# Patient Record
Sex: Male | Born: 1935 | Race: White | Hispanic: No | Marital: Married | Smoking: Never smoker
Health system: Southern US, Community
[De-identification: ages and names within clinical notes are randomized; demographics above are authoritative.]

## PROBLEM LIST (undated history)

## (undated) DIAGNOSIS — R609 Edema, unspecified: Secondary | ICD-10-CM

## (undated) DIAGNOSIS — G4730 Sleep apnea, unspecified: Secondary | ICD-10-CM

## (undated) DIAGNOSIS — M4800 Spinal stenosis, site unspecified: Secondary | ICD-10-CM

## (undated) DIAGNOSIS — D649 Anemia, unspecified: Secondary | ICD-10-CM

## (undated) DIAGNOSIS — E669 Obesity, unspecified: Secondary | ICD-10-CM

## (undated) DIAGNOSIS — I10 Essential (primary) hypertension: Secondary | ICD-10-CM

## (undated) DIAGNOSIS — K5790 Diverticulosis of intestine, part unspecified, without perforation or abscess without bleeding: Secondary | ICD-10-CM

## (undated) DIAGNOSIS — I509 Heart failure, unspecified: Secondary | ICD-10-CM

## (undated) DIAGNOSIS — I2510 Atherosclerotic heart disease of native coronary artery without angina pectoris: Secondary | ICD-10-CM

## (undated) DIAGNOSIS — E785 Hyperlipidemia, unspecified: Secondary | ICD-10-CM

## (undated) DIAGNOSIS — I219 Acute myocardial infarction, unspecified: Secondary | ICD-10-CM

## (undated) DIAGNOSIS — I499 Cardiac arrhythmia, unspecified: Secondary | ICD-10-CM

## (undated) DIAGNOSIS — K429 Umbilical hernia without obstruction or gangrene: Secondary | ICD-10-CM

## (undated) DIAGNOSIS — C801 Malignant (primary) neoplasm, unspecified: Secondary | ICD-10-CM

## (undated) DIAGNOSIS — I255 Ischemic cardiomyopathy: Secondary | ICD-10-CM

## (undated) DIAGNOSIS — R748 Abnormal levels of other serum enzymes: Secondary | ICD-10-CM

## (undated) DIAGNOSIS — R0600 Dyspnea, unspecified: Secondary | ICD-10-CM

## (undated) HISTORY — DX: Atherosclerotic heart disease of native coronary artery without angina pectoris: I25.10

## (undated) HISTORY — PX: CARDIAC CATHETERIZATION: SHX172

## (undated) HISTORY — DX: Abnormal levels of other serum enzymes: R74.8

## (undated) HISTORY — DX: Spinal stenosis, site unspecified: M48.00

## (undated) HISTORY — DX: Ischemic cardiomyopathy: I25.5

## (undated) HISTORY — PX: OTHER SURGICAL HISTORY: SHX169

## (undated) HISTORY — DX: Essential (primary) hypertension: I10

## (undated) HISTORY — DX: Obesity, unspecified: E66.9

## (undated) HISTORY — PX: EYE SURGERY: SHX253

## (undated) HISTORY — PX: TOTAL HIP ARTHROPLASTY: SHX124

## (undated) HISTORY — DX: Dyspnea, unspecified: R06.00

## (undated) HISTORY — DX: Hyperlipidemia, unspecified: E78.5

## (undated) HISTORY — PX: SHOULDER SURGERY: SHX246

---

## 2003-09-25 ENCOUNTER — Ambulatory Visit (HOSPITAL_COMMUNITY): Admission: RE | Admit: 2003-09-25 | Discharge: 2003-09-25 | Payer: Self-pay | Admitting: Internal Medicine

## 2005-01-02 ENCOUNTER — Encounter: Admission: RE | Admit: 2005-01-02 | Discharge: 2005-01-02 | Payer: Self-pay | Admitting: Internal Medicine

## 2005-09-08 ENCOUNTER — Ambulatory Visit: Payer: Self-pay | Admitting: Cardiovascular Disease

## 2005-09-11 ENCOUNTER — Ambulatory Visit: Payer: Self-pay

## 2006-07-16 ENCOUNTER — Encounter: Admission: RE | Admit: 2006-07-16 | Discharge: 2006-07-16 | Payer: Self-pay | Admitting: Neurosurgery

## 2006-08-06 ENCOUNTER — Ambulatory Visit: Payer: Self-pay | Admitting: Cardiovascular Disease

## 2006-08-24 ENCOUNTER — Encounter: Payer: Self-pay | Admitting: Cardiovascular Disease

## 2006-08-24 ENCOUNTER — Ambulatory Visit: Payer: Self-pay

## 2006-08-24 ENCOUNTER — Ambulatory Visit: Payer: Self-pay | Admitting: Cardiovascular Disease

## 2006-08-24 LAB — CONVERTED CEMR LAB
AST: 38 units/L — ABNORMAL HIGH (ref 0–37)
Bilirubin, Direct: 0.1 mg/dL (ref 0.0–0.3)
CO2: 27 meq/L (ref 19–32)
Chloride: 99 meq/L (ref 96–112)
Cholesterol: 197 mg/dL (ref 0–200)
GFR calc non Af Amer: 63 mL/min
Glucose, Bld: 102 mg/dL — ABNORMAL HIGH (ref 70–99)
HDL: 42.1 mg/dL (ref >39.0)
LDL Cholesterol: 136 mg/dL — ABNORMAL HIGH (ref 0–99)
Sodium: 136 meq/L (ref 135–145)
Total CHOL/HDL Ratio: 4.7
Total Protein: 6.9 g/dL (ref 6.0–8.3)

## 2006-10-15 ENCOUNTER — Ambulatory Visit: Payer: Self-pay | Admitting: Cardiovascular Disease

## 2006-10-15 LAB — CONVERTED CEMR LAB
ALT: 86 units/L — ABNORMAL HIGH (ref 0–53)
AST: 46 units/L — ABNORMAL HIGH (ref 0–37)
Alkaline Phosphatase: 37 units/L — ABNORMAL LOW (ref 39–117)
Bilirubin, Direct: 0.2 mg/dL (ref 0.0–0.3)
Cholesterol: 205 mg/dL (ref 0–200)
Total Bilirubin: 1.8 mg/dL — ABNORMAL HIGH (ref 0.3–1.2)
Total CHOL/HDL Ratio: 5.4
Total Protein: 6.6 g/dL (ref 6.0–8.3)

## 2006-10-17 ENCOUNTER — Ambulatory Visit: Payer: Self-pay | Admitting: Cardiovascular Disease

## 2007-05-24 ENCOUNTER — Ambulatory Visit: Payer: Self-pay | Admitting: Cardiovascular Disease

## 2007-05-24 LAB — CONVERTED CEMR LAB
ALT: 101 units/L — ABNORMAL HIGH (ref 0–53)
AST: 59 units/L — ABNORMAL HIGH (ref 0–37)
CO2: 29 meq/L (ref 19–32)
Calcium: 9.5 mg/dL (ref 8.4–10.5)
Chloride: 105 meq/L (ref 96–112)
GFR calc Af Amer: 59 mL/min
LDL Cholesterol: 131 mg/dL — ABNORMAL HIGH (ref 0–99)
Potassium: 3.6 meq/L (ref 3.5–5.1)
Sodium: 140 meq/L (ref 135–145)
Total CHOL/HDL Ratio: 4.8
Total Protein: 6.8 g/dL (ref 6.0–8.3)

## 2007-05-27 ENCOUNTER — Ambulatory Visit: Payer: Self-pay | Admitting: Cardiovascular Disease

## 2008-01-27 ENCOUNTER — Ambulatory Visit: Payer: Self-pay | Admitting: Cardiovascular Disease

## 2008-01-27 LAB — CONVERTED CEMR LAB
ALT: 81 units/L — ABNORMAL HIGH (ref 0–53)
Alkaline Phosphatase: 38 units/L — ABNORMAL LOW (ref 39–117)
Bilirubin, Direct: 0.3 mg/dL (ref 0.0–0.3)
CO2: 28 meq/L (ref 19–32)
Calcium: 9.4 mg/dL (ref 8.4–10.5)
GFR calc Af Amer: 70 mL/min
Glucose, Bld: 122 mg/dL — ABNORMAL HIGH (ref 70–99)
Potassium: 3.6 meq/L (ref 3.5–5.1)
Sodium: 137 meq/L (ref 135–145)
TSH: 4.57 microintl units/mL (ref 0.35–5.50)
Total Bilirubin: 1.9 mg/dL — ABNORMAL HIGH (ref 0.3–1.2)
Total CHOL/HDL Ratio: 4.8
Total Protein: 6.9 g/dL (ref 6.0–8.3)
Triglycerides: 143 mg/dL (ref 0–149)

## 2008-02-28 ENCOUNTER — Ambulatory Visit (HOSPITAL_COMMUNITY): Admission: RE | Admit: 2008-02-28 | Discharge: 2008-02-28 | Payer: Self-pay | Admitting: Cardiovascular Disease

## 2008-05-23 DIAGNOSIS — I10 Essential (primary) hypertension: Secondary | ICD-10-CM | POA: Insufficient documentation

## 2008-05-23 DIAGNOSIS — I2510 Atherosclerotic heart disease of native coronary artery without angina pectoris: Secondary | ICD-10-CM | POA: Insufficient documentation

## 2008-05-23 DIAGNOSIS — E669 Obesity, unspecified: Secondary | ICD-10-CM | POA: Insufficient documentation

## 2008-05-23 DIAGNOSIS — E785 Hyperlipidemia, unspecified: Secondary | ICD-10-CM | POA: Insufficient documentation

## 2008-05-25 ENCOUNTER — Encounter: Admission: RE | Admit: 2008-05-25 | Discharge: 2008-05-25 | Payer: Self-pay | Admitting: Neurosurgery

## 2008-06-02 ENCOUNTER — Telehealth: Payer: Self-pay | Admitting: Cardiovascular Disease

## 2008-06-03 ENCOUNTER — Encounter: Payer: Self-pay | Admitting: Physician Assistant

## 2008-06-03 ENCOUNTER — Ambulatory Visit: Payer: Self-pay | Admitting: Cardiology

## 2008-06-03 DIAGNOSIS — M48061 Spinal stenosis, lumbar region without neurogenic claudication: Secondary | ICD-10-CM

## 2008-06-03 DIAGNOSIS — C439 Malignant melanoma of skin, unspecified: Secondary | ICD-10-CM | POA: Insufficient documentation

## 2008-06-18 ENCOUNTER — Telehealth (INDEPENDENT_AMBULATORY_CARE_PROVIDER_SITE_OTHER): Payer: Self-pay

## 2008-06-22 ENCOUNTER — Ambulatory Visit: Payer: Self-pay

## 2008-06-22 ENCOUNTER — Encounter: Payer: Self-pay | Admitting: Cardiovascular Disease

## 2008-07-15 ENCOUNTER — Ambulatory Visit: Payer: Self-pay | Admitting: Cardiovascular Disease

## 2008-07-17 ENCOUNTER — Telehealth: Payer: Self-pay | Admitting: Cardiovascular Disease

## 2008-08-26 ENCOUNTER — Inpatient Hospital Stay (HOSPITAL_COMMUNITY): Admission: RE | Admit: 2008-08-26 | Discharge: 2008-08-27 | Payer: Self-pay | Admitting: Neurosurgery

## 2008-12-30 ENCOUNTER — Encounter (INDEPENDENT_AMBULATORY_CARE_PROVIDER_SITE_OTHER): Payer: Self-pay | Admitting: *Deleted

## 2009-01-04 ENCOUNTER — Ambulatory Visit: Payer: Self-pay | Admitting: Cardiovascular Disease

## 2009-01-04 DIAGNOSIS — M79609 Pain in unspecified limb: Secondary | ICD-10-CM

## 2009-01-06 LAB — CONVERTED CEMR LAB
ALT: 88 units/L — ABNORMAL HIGH (ref 0–53)
Albumin: 3.7 g/dL (ref 3.5–5.2)
BUN: 20 mg/dL (ref 6–23)
CO2: 27 meq/L (ref 19–32)
Calcium: 9 mg/dL (ref 8.4–10.5)
Chloride: 104 meq/L (ref 96–112)
Cholesterol: 183 mg/dL (ref 0–200)
Creatinine, Ser: 1.5 mg/dL (ref 0.4–1.5)
Glucose, Bld: 107 mg/dL — ABNORMAL HIGH (ref 70–99)
HDL: 39.8 mg/dL (ref >39.00)
Total Protein: 7 g/dL (ref 6.0–8.3)
Triglycerides: 116 mg/dL (ref 0.0–149.0)

## 2009-02-01 ENCOUNTER — Ambulatory Visit: Payer: Self-pay

## 2009-02-01 ENCOUNTER — Encounter: Payer: Self-pay | Admitting: Cardiovascular Disease

## 2009-02-08 ENCOUNTER — Ambulatory Visit: Payer: Self-pay | Admitting: Gastroenterology

## 2009-02-08 DIAGNOSIS — Z860100 Personal history of colon polyps, unspecified: Secondary | ICD-10-CM | POA: Insufficient documentation

## 2009-02-08 DIAGNOSIS — K3189 Other diseases of stomach and duodenum: Secondary | ICD-10-CM | POA: Insufficient documentation

## 2009-02-08 DIAGNOSIS — R1013 Epigastric pain: Secondary | ICD-10-CM

## 2009-02-08 LAB — CONVERTED CEMR LAB
AST: 62 units/L — ABNORMAL HIGH (ref 0–37)
Alkaline Phosphatase: 49 units/L (ref 39–117)
BUN: 24 mg/dL — ABNORMAL HIGH (ref 6–23)
Eosinophils Absolute: 0.3 10*3/uL (ref 0.0–0.7)
Eosinophils Relative: 5 % (ref 0.0–5.0)
Glucose, Bld: 126 mg/dL — ABNORMAL HIGH (ref 70–99)
HCT: 46.3 % (ref 39.0–52.0)
Lymphs Abs: 1.9 10*3/uL (ref 0.7–4.0)
MCHC: 33.4 g/dL (ref 30.0–36.0)
MCV: 99.9 fL (ref 78.0–100.0)
Monocytes Absolute: 0.6 10*3/uL (ref 0.1–1.0)
Platelets: 171 10*3/uL (ref 150.0–400.0)
Sodium: 139 meq/L (ref 135–145)
Total Bilirubin: 1.8 mg/dL — ABNORMAL HIGH (ref 0.3–1.2)
Total Protein: 7.7 g/dL (ref 6.0–8.3)
WBC: 5.7 10*3/uL (ref 4.5–10.5)

## 2009-02-11 ENCOUNTER — Ambulatory Visit: Payer: Self-pay | Admitting: Gastroenterology

## 2009-02-16 ENCOUNTER — Encounter: Payer: Self-pay | Admitting: Gastroenterology

## 2009-03-09 ENCOUNTER — Ambulatory Visit: Payer: Self-pay | Admitting: Gastroenterology

## 2009-03-09 DIAGNOSIS — K219 Gastro-esophageal reflux disease without esophagitis: Secondary | ICD-10-CM

## 2009-03-09 DIAGNOSIS — R1084 Generalized abdominal pain: Secondary | ICD-10-CM | POA: Insufficient documentation

## 2009-03-22 ENCOUNTER — Encounter: Payer: Self-pay | Admitting: Gastroenterology

## 2009-03-22 ENCOUNTER — Ambulatory Visit (HOSPITAL_COMMUNITY): Admission: RE | Admit: 2009-03-22 | Discharge: 2009-03-22 | Payer: Self-pay | Admitting: Gastroenterology

## 2009-06-28 ENCOUNTER — Encounter: Admission: RE | Admit: 2009-06-28 | Discharge: 2009-06-28 | Payer: Self-pay | Admitting: Neurosurgery

## 2010-02-06 ENCOUNTER — Encounter: Payer: Self-pay | Admitting: Cardiovascular Disease

## 2010-02-13 LAB — CONVERTED CEMR LAB
ALT: 93 units/L — ABNORMAL HIGH (ref 0–53)
Albumin: 4.1 g/dL (ref 3.5–5.2)
Alkaline Phosphatase: 45 units/L (ref 39–117)
Bilirubin, Direct: 0.1 mg/dL (ref 0.0–0.3)
CO2: 29 meq/L (ref 19–32)
Chloride: 102 meq/L (ref 96–112)
Glucose, Bld: 116 mg/dL — ABNORMAL HIGH (ref 70–99)
Sodium: 140 meq/L (ref 135–145)
Total Protein: 7.1 g/dL (ref 6.0–8.3)

## 2010-02-16 NOTE — Procedures (Signed)
Summary: Upper Endoscopy  Patient: Harold Jennings Note: All result statuses are Final unless otherwise noted.  Tests: (1) Upper Endoscopy (EGD)   EGD Upper Endoscopy       DONE (C)     [facility]     [address]     [place], [place]  [address]           ENDOSCOPY PROCEDURE REPORT           PATIENT:  Harold, Jennings  MR#:  [id number]     BIRTHDATE:  Jan 10, 1933, 76 yrs. old  GENDER:  male           ENDOSCOPIST:  Barbette Hair. Arlyce Dice, MD     Referred by:           PROCEDURE DATE:  02/11/2009     PROCEDURE:  EGD with biopsy, Maloney Dilation of Esophagus     ASA CLASS:  Class II     INDICATIONS:  cough, dyspepsia           MEDICATIONS:   There was residual sedation effect present from     prior procedure., glycopyrrolate (Robinal) 0.2 mg IV,  0.6cc     simethancone 0.6 cc PO, Versed 1mg  IV (correction)     TOPICAL ANESTHETIC:  Exactacain Spray           DESCRIPTION OF PROCEDURE:   After the risks benefits and     alternatives of the procedure were thoroughly explained, informed     consent was obtained.  The LB GIF-H180 [id number] endoscope was     introduced through the mouth and advanced to the third portion of     the duodenum, without limitations.  The instrument was slowly     withdrawn as the mucosa was fully examined.     <<PROCEDUREIMAGES>>           Moderate gastritis was found in the antrum. Multiple areas of     submucosal hemorrhage with overlying erythema. Bxs taken (see     image3).  Esophagitis was found at the gastroesophageal junction.     Grade B erosive esophagitis. Single linear ulcer just proximal to     GE junction (see image5 and image9).  A stricture was found at the     gastroesophageal junction (see image7). Moderate stricture     Dilation with maloney dilator 18mm Moderate resistance; no heme     Otherwise the examination was normal.    Retroflexed views revealed     no abnormalities.    The scope was then withdrawn from the patient     and the  procedure completed.           COMPLICATIONS:  None           ENDOSCOPIC IMPRESSION:     1) Moderate hemorrhagic gastritis in the antrum     2) Erosive Esophagitis at the gastroesophageal junction     3) Stricture at the gastroesophageal junction - s/p dilitation     4) Otherwise normal examination     RECOMMENDATIONS:     1)  Begin zegerid 40mg  daily     2) Call office next 2-3 days to schedule an office appointment for     2 weeks           REPEAT EXAM:  No           ______________________________     Barbette Hair. Arlyce Dice, MD  CC:  Nila Nephew, MD           n.     REVISED:  02/17/2009 03:02 PM     eSIGNED:   Barbette Hair. Butler Vegh at 02/17/2009 03:02 PM           Jerene Pitch, [id number]  Note: An exclamation mark (!) indicates a result that was not dispersed into the flowsheet. Document Creation Date: 02/17/2009 3:03 PM _______________________________________________________________________  (1) Order result status: Final Collection or observation date-time: 02/11/2009 15:43 Requested date-time:  Receipt date-time:  Reported date-time:  Referring Physician:   Ordering Physician: Melvia Heaps [phone]) Specimen Source:  Source: Launa Grill Order Number: [id number] Lab site:

## 2010-02-16 NOTE — Letter (Signed)
Summary: Moviprep Instructions and [facility]  [address] [place], [place] [address]   Phone: [phone]  Fax: [phone]       Harold Jennings    08-11-1932    MRN: [id number]        Procedure Day /Date:THURSDAY 02/11/2009     Arrival Time:2PM     Procedure Time:3PM     Location of Procedure:                    X   [facility] (4th Floor)   PREPARATION FOR COLONOSCOPY WITH MOVIPREP   Starting 5 days prior to your procedure TODAY do not eat nuts, seeds, popcorn, corn, beans, peas,  salads, or any raw vegetables.  Do not take any fiber supplements (e.g. Metamucil, Citrucel, and Benefiber).  THE DAY BEFORE YOUR PROCEDURE         DATE:02/10/2009  DAY: WEDNESDAY  1.  Drink clear liquids the entire day-NO SOLID FOOD  2.  Do not drink anything colored red or purple.  Avoid juices with pulp.  No orange juice.  3.  Drink at least 64 oz. (8 glasses) of fluid/clear liquids during the day to prevent dehydration and help the prep work efficiently.  CLEAR LIQUIDS INCLUDE: Water Jello Ice Popsicles Tea (sugar ok, no milk/cream) Powdered fruit flavored drinks Coffee (sugar ok, no milk/cream) Gatorade Juice: apple, white grape, white cranberry  Lemonade Clear bullion, consomm, broth Carbonated beverages (any kind) Strained chicken noodle soup Hard Candy                             4.  In the morning, mix first dose of MoviPrep solution:    Empty 1 Pouch A and 1 Pouch B into the disposable container    Add lukewarm drinking water to the top line of the container. Mix to dissolve    Refrigerate (mixed solution should be used within 24 hrs)  5.  Begin drinking the prep at 5:00 p.m. The MoviPrep container is divided by 4 marks.   Every 15 minutes drink the solution down to the next mark (approximately 8 oz) until the full liter is complete.   6.  Follow completed prep with 16 oz of clear liquid of your choice (Nothing red or purple).   Continue to drink clear liquids until bedtime.  7.  Before going to bed, mix second dose of MoviPrep solution:    Empty 1 Pouch A and 1 Pouch B into the disposable container    Add lukewarm drinking water to the top line of the container. Mix to dissolve    Refrigerate  THE DAY OF YOUR PROCEDURE      DATE: 02/11/2009 DAY: THURSDAY  Beginning at 10a.m. (5 hours before procedure):         1. Every 15 minutes, drink the solution down to the next mark (approx 8 oz) until the full liter is complete.  2. Follow completed prep with 16 oz. of clear liquid of your choice.    3. You may drink clear liquids until 1PM (2 HOURS BEFORE PROCEDURE).   MEDICATION INSTRUCTIONS  Unless otherwise instructed, you should take regular prescription medications with a small sip of water   as early as possible the morning of your procedure.        OTHER INSTRUCTIONS  You will need a responsible adult at least 75 years of age to accompany you  and drive you home.   This person must remain in the waiting room during your procedure.  Wear loose fitting clothing that is easily removed.  Leave jewelry and other valuables at home.  However, you may wish to bring a book to read or  an iPod/MP3 player to listen to music as you wait for your procedure to start.  Remove all body piercing jewelry and leave at home.  Total time from sign-in until discharge is approximately 2-3 hours.  You should go home directly after your procedure and rest.  You can resume normal activities the  day after your procedure.  The day of your procedure you should not:   Drive   Make legal decisions   Operate machinery   Drink alcohol   Return to work  You will receive specific instructions about eating, activities and medications before you leave.    The above instructions have been reviewed and explained to me by   _______________________    I fully understand and can verbalize these instructions  _____________________________ Date _________

## 2010-02-16 NOTE — Procedures (Signed)
Summary: Colonoscopy  Patient: Harold Jennings Note: All result statuses are Final unless otherwise noted.  Tests: (1) Colonoscopy (COL)   COL Colonoscopy           DONE     [facility]     [address]     [place], [place]  [address]           COLONOSCOPY PROCEDURE REPORT           PATIENT:  Harold, Jennings  MR#:  [id number]     BIRTHDATE:  10-19-1932, 76 yrs. old  GENDER:  male           ENDOSCOPIST:  Harold Hair. Arlyce Dice, MD     Referred by:           PROCEDURE DATE:  02/11/2009     PROCEDURE:  Colonoscopy with snare polypectomy     ASA CLASS:  Class II     INDICATIONS:  Colorectal Cancer Screening, history of     pre-cancerous (adenomatous) colon polyps Last exam 8 years ago           MEDICATIONS:   Fentanyl 50 mcg IV, Versed 6 mg IV, Benadryl 25 mg     IV           DESCRIPTION OF PROCEDURE:   After the risks benefits and     alternatives of the procedure were thoroughly explained, informed     consent was obtained.  Digital rectal exam was performed and     revealed no abnormalities.   The LB CF-H180AL [id number] endoscope     was introduced through the anus and advanced to the cecum, which     was identified by both the appendix and ileocecal valve, without     limitations.  The quality of the prep was excellent, using     MoviPrep.  The instrument was then slowly withdrawn as the colon     was fully examined.     <<PROCEDUREIMAGES>>           FINDINGS:  A sessile polyp was found at the hepatic flexure. It     was 4 mm in size. Polyp was snared without cautery. Retrieval was     successful (see image5). snare polyp  Moderate diverticulosis was     found in the sigmoid colon (see image2 and image12).  This was     otherwise a normal examination of the colon (see image6, image7,     image8, image9, image13, and image15).   Retroflexed views in the     rectum revealed no abnormalities.    The scope was then withdrawn     from the patient and the procedure completed.           COMPLICATIONS:  None           ENDOSCOPIC IMPRESSION:     1) 4 mm sessile polyp at the hepatic flexure     2) Moderate diverticulosis in the sigmoid colon     3) Otherwise normal examination     RECOMMENDATIONS:     1) If the polyp(s) removed today are proven to be adenomatous     (pre-cancerous) polyps, you will need a repeat colonoscopy in 5     years. Otherwise you should continue to follow colorectal cancer     screening guidelines for "routine risk" patients with colonoscopy     in 10 years.           REPEAT EXAM:  You will receive a letter from Dr. Arlyce Jennings in 1-2     weeks, after reviewing the final pathology, with followup     recommendations.           ______________________________     Harold Hair Arlyce Dice, MD           CC:  Harold Nephew, MD           n.     Harold Jennings:   Harold Jennings at 02/11/2009 03:36 PM           Harold Jennings, [id number]  Note: An exclamation mark (!) indicates a result that was not dispersed into the flowsheet. Document Creation Date: 02/11/2009 3:37 PM _______________________________________________________________________  (1) Order result status: Final Collection or observation date-time: 02/11/2009 15:27 Requested date-time:  Receipt date-time:  Reported date-time:  Referring Physician:   Ordering Physician: Melvia Heaps [phone]) Specimen Source:  Source: Launa Grill Order Number: [id number] Lab site:   Appended Document: Colonoscopy     Procedures Next Due Date:    Colonoscopy: 01/2014

## 2010-02-16 NOTE — Letter (Signed)
Summary: Results Letter  [facility]  [address] [place], [place] [address]   Phone: [phone]  Fax: [phone]        March 22, 2009 MRN: [id number]    [facility] [address] [place], [place]  [address]    Dear Mr. Remus,  Your gastric emptying scan did not show any remarkable findings.  Please continue with the recommendations previously discussed.  Should you have any further questions or immediate concers, feel free to contact me.  Sincerely,  Barbette Hair. Arlyce Dice, M.D., [facility]          Sincerely,  Louis Meckel MD  This letter has been electronically signed by your physician.  Appended Document: Results Letter letter mailed

## 2010-02-16 NOTE — Assessment & Plan Note (Signed)
Summary: GASTROENTERITIS--CH   History of Present Illness Visit Type: Initial Consult Primary GI MD: Melvia Heaps MD [facility] Primary Provider: Nila Nephew, MD Requesting Provider: Tonny Bollman, MD Chief Complaint: Increase in belching after meals. Pt also has a generalized left sided pain.  History of Present Illness:   Harold Jennings is a pleasant 77 year old white male referred at the request of Dr. Chilton Si for evaluation of excess belching.  Over the past year been complaining of severe belching after drinking any liquids.  He may belch  up to 25-30 times.  He also is belching between meals.  He denies abdominal pain, per se except for some mild soreness in his left upper quadrant along his left flank.  He denies pyrosis, nausea or vomiting.  There has been no change in bowel habits.  He has tried PPI therapy in the past without relief.  Patient has a history of colon polyps.  Last colonoscopy approximately 8 years ago apparently was negative.    GI Review of Systems    Reports abdominal pain and  belching.     Location of  Abdominal pain: left side.    Denies acid reflux, bloating, chest pain, dysphagia with liquids, dysphagia with solids, heartburn, loss of appetite, nausea, vomiting, vomiting blood, weight loss, and  weight gain.        Denies anal fissure, black tarry stools, change in bowel habit, constipation, diarrhea, diverticulosis, fecal incontinence, heme positive stool, hemorrhoids, irritable bowel syndrome, jaundice, light color stool, liver problems, rectal bleeding, and  rectal pain. Preventive Screening-Counseling & Management      Drug Use:  no.      Current Medications (verified): 1)  Aspirin 81 Mg  Tbec (Aspirin) .... One By Mouth Every Day 2)  Atenolol 25 Mg Tabs (Atenolol) .... Take One Daily 3)  Zetia 10 Mg Tabs (Ezetimibe) .... Take One Daily 4)  Hydrochlorothiazide 25 Mg Tabs (Hydrochlorothiazide) .... Take One Daily 5)  Synthroid 50 Mcg Tabs (Levothyroxine  Sodium) .... Take One Daily 6)  Lisinopril 10 Mg Tabs (Lisinopril) .... Take One Daily 7)  Fish Oil 1000 Mg Caps (Omega-3 Fatty Acids) .Marland Kitchen.. 1 Cap Once Daily 8)  Doxazosin Mesylate 8 Mg Tabs (Doxazosin Mesylate) .... Take 1 Tablet By Mouth Once A Day 9)  Multivitamins  Tabs (Multiple Vitamin) .... Take 1 Tablet By Mouth Once A Day  Allergies (verified): No Known Drug Allergies  Past History:  Past Medical History: Reviewed history from 01/04/2009 and no changes required. Current Problems:  DYSLIPIDEMIA (ICD-272.4) HYPERTENSION (ICD-401.9) OBESITY (ICD-278.00) CAD (ICD-414.00), remote silent inferior MI increased LFTs. Hepatic ultrasound showed echogenic liver suggesting fatty change in February 2010  Past Surgical History: Hip replacement Eye surgery Lower back surgery for spinal stenosis Shoulder surgery  Family History: Father stroke Mother drug abuse No FH of Colon Cancer:  Social History: Married  Tobacco Use - No.  Alcohol Use - yes 3-4 glasses of wine daily Daily Caffeine Use Illicit Drug Use - no Drug Use:  no  Review of Systems  The patient denies allergy/sinus, anemia, anxiety-new, arthritis/joint pain, back pain, blood in urine, breast changes/lumps, change in vision, confusion, cough, coughing up blood, depression-new, fainting, fatigue, fever, headaches-new, hearing problems, heart murmur, heart rhythm changes, itching, menstrual pain, muscle pains/cramps, night sweats, nosebleeds, pregnancy symptoms, shortness of breath, skin rash, sleeping problems, sore throat, swelling of feet/legs, swollen lymph glands, thirst - excessive , urination - excessive , urination changes/pain, urine leakage, vision changes, and voice change.  Vital Signs:  Patient profile:   77 year old male Height:      68 inches Weight:      261.38 pounds BMI:     39.89 Pulse rate:   70 / minute Pulse rhythm:   regular BP sitting:   132 / 68  (left arm) Cuff size:   large  Vitals  Entered By: Christie Nottingham CMA Duncan Dull) (February 08, 2009 10:11 AM)  Physical Exam  Additional Exam:  He is a heavyset male  belching multiple times in the examining room  skin: anicteric; he has a very ruddy complexion. HEENT: normocephalic; PEERLA; no nasal or pharyngeal abnormalities neck: supple nodes: no cervical lymphadenopathy chest: clear to ausculatation and percussion heart: no murmurs, gallops, or rubs abd: soft, nontender; BS normoactive; no abdominal masses, tenderness, organomegaly; there is a prominent venous pattern and a large but reducible umbilical hernia rectal: deferred ext: no cynanosis, clubbing, edema skeletal: no deformities neuro: oriented x 3; no focal abnormalities    Impression & Recommendations:  Problem # 1:  DYSPEPSIA&OTHER SPEC DISORDERS FUNCTION STOMACH (ICD-536.8)  Patient may have ulcer or nonulcer dyspepsia.  His visible belching in my presence raises a  question of aerophagia and may be functional in nature.  Recommendations #1 upper endoscopy  I will make further recommendations pending results of his endoscopy.  While the patient admits to 3-4 drinks daily, I have  a suspicion that he may have underlying alcohol-related liver disease.    Orders: TLB-CBC Platelet - w/Differential (85025-CBCD) TLB-CMP (Comprehensive Metabolic Pnl) (80053-COMP) Colon/Endo (Colon/Endo)  Problem # 2:  PERSONAL HISTORY OF COLONIC POLYPS (ICD-V12.72)  Plan followup colonoscopy  Orders: TLB-CBC Platelet - w/Differential (85025-CBCD) TLB-CMP (Comprehensive Metabolic Pnl) (80053-COMP) Colon/Endo (Colon/Endo)  Problem # 3:  CAD (ICD-414.00) Assessment: Comment Only  Patient Instructions: 1)  CC Dr. Nila Nephew 2)  Your Colonoscopy/Endo is scheduled for 02/11/2009 at 3pm 3)  You can pick up your MoviPrep from your pharmacy today 4)  The medication list was reviewed and reconciled.  All changed / newly prescribed medications were explained.  A complete  medication list was provided to the patient / caregiver. Prescriptions: MOVIPREP 100 GM  SOLR (PEG-KCL-NACL-NASULF-NA ASC-C) As per prep instructions.  #1 x 0   Entered by:   Merri Ray CMA (AAMA)   Authorized by:   Louis Meckel MD   Signed by:   Merri Ray CMA (AAMA) on 02/08/2009   Method used:   Electronically to        [facility] (retail)       [address]       [place], [place]  [address]       Ph: [phone] or [phone]       Fax: [phone]   RxID:   [id number]

## 2010-02-16 NOTE — Letter (Signed)
Summary: Patient Notice- Polyp Results  [facility]  [address] [place], [place] [address]   Phone: [phone]  Fax: [phone]        February 16, 2009 MRN: [id number]    [facility] [address] [place], [place]  [address]    Dear Mr. Tritschler,  I am pleased to inform you that the colon polyp(s) removed during your recent colonoscopy was (were) found to be benign (no cancer detected) upon pathologic examination.  I recommend you have a repeat colonoscopy examination in 5_ years to look for recurrent polyps, as having colon polyps increases your risk for having recurrent polyps or even colon cancer in the future.  Should you develop new or worsening symptoms of abdominal pain, bowel habit changes or bleeding from the rectum or bowels, please schedule an evaluation with either your primary care physician or with me.  Additional information/recommendations:  __ No further action with gastroenterology is needed at this time. Please      follow-up with your primary care physician for your other healthcare      needs.  __ Please call [phone] to schedule a return visit to review your      situation.  __ Please keep your follow-up visit as already scheduled.  _x_ Continue treatment plan as outlined the day of your exam.  Please call us if you are having persistent problems or have questions about your condition that have not been fully answered at this time.  Sincerely,  Louis Meckel MD  This letter has been electronically signed by your physician.  Appended Document: Patient Notice- Polyp Results letter mailed 2.2.11

## 2010-02-16 NOTE — Letter (Signed)
Summary: Results Letter  [facility]  [address] [place], [place] [address]   Phone: [phone]  Fax: [phone]        February 08, 2009 MRN: [id number]    [facility] [address] [place], [place]  [address]    Dear Harold Jennings,  It is my pleasure to have treated you recently as a new patient in my office. I appreciate your confidence and the opportunity to participate in your care.  Since I do have a busy inpatient endoscopy schedule and office schedule, my office hours vary weekly. I am, however, available for emergency calls everyday through my office. If I am not available for an urgent office appointment, another one of our gastroenterologist will be able to assist you.  My well-trained staff are prepared to help you at all times. For emergencies after office hours, a physician from our Gastroenterology section is always available through my 24 hour answering service  Once again I welcome you as a new patient and I look forward to a happy and healthy relationship             Sincerely,  Louis Meckel MD  This letter has been electronically signed by your physician.  Appended Document: Results Letter letter mailed

## 2010-02-16 NOTE — Assessment & Plan Note (Signed)
Summary: ECL FOLLOW UP/YF           (3:30 appt/Deborah)   History of Present Illness Visit Type: Follow-up Visit Primary GI MD: Melvia Heaps MD [facility] Primary Provider: Nila Nephew, MD Requesting Provider: n/a Chief Complaint: ECL f/u History of Present Illness:   Harold Jennings has returned following upper endoscopy and colonoscopy.  His former demonstrated moderate hemorrhagic gastritis and erosive esophagitis.  An early esophageal stricture was dilated.  An adenomatous polyp was removed from his colon.  Diverticulosis was present.  On Zegerid he denies pyrosis.  He still is complaining of postprandial excess belching.  This tends to occur 20-30 minutes after a meal.   GI Review of Systems      Denies abdominal pain, acid reflux, belching, bloating, chest pain, dysphagia with liquids, dysphagia with solids, heartburn, loss of appetite, nausea, vomiting, vomiting blood, weight loss, and  weight gain.        Denies anal fissure, black tarry stools, change in bowel habit, constipation, diarrhea, diverticulosis, fecal incontinence, heme positive stool, hemorrhoids, irritable bowel syndrome, jaundice, light color stool, liver problems, rectal bleeding, and  rectal pain.    Current Medications (verified): 1)  Aspirin 81 Mg  Tbec (Aspirin) .... One By Mouth Every Day 2)  Atenolol 25 Mg Tabs (Atenolol) .... Take One Daily 3)  Zetia 10 Mg Tabs (Ezetimibe) .... Take One Daily 4)  Hydrochlorothiazide 25 Mg Tabs (Hydrochlorothiazide) .... Take One Daily 5)  Synthroid 50 Mcg Tabs (Levothyroxine Sodium) .... Take One Daily 6)  Lisinopril 10 Mg Tabs (Lisinopril) .... Take One Daily 7)  Fish Oil 1000 Mg Caps (Omega-3 Fatty Acids) .Marland Kitchen.. 1 Cap Once Daily 8)  Doxazosin Mesylate 8 Mg Tabs (Doxazosin Mesylate) .... Take 1 Tablet By Mouth Once A Day 9)  Multivitamins  Tabs (Multiple Vitamin) .... Take 1 Tablet By Mouth Once A Day 10)  Zegerid 40-1100 Mg Caps (Omeprazole-Sodium Bicarbonate) .... Take 1 Tab Once  Daily  Allergies (verified): No Known Drug Allergies  Past History:  Past Medical History: Reviewed history from 01/04/2009 and no changes required. Current Problems:  DYSLIPIDEMIA (ICD-272.4) HYPERTENSION (ICD-401.9) OBESITY (ICD-278.00) CAD (ICD-414.00), remote silent inferior MI increased LFTs. Hepatic ultrasound showed echogenic liver suggesting fatty change in February 2010  Past Surgical History: Reviewed history from 02/08/2009 and no changes required. Hip replacement Eye surgery Lower back surgery for spinal stenosis Shoulder surgery  Family History: Reviewed history from 02/08/2009 and no changes required. Father stroke Mother drug abuse No FH of Colon Cancer:  Social History: Reviewed history from 02/08/2009 and no changes required. Married  Tobacco Use - No.  Alcohol Use - yes 3-4 glasses of wine daily Daily Caffeine Use Illicit Drug Use - no  Review of Systems  The patient denies allergy/sinus, anemia, anxiety-new, arthritis/joint pain, back pain, blood in urine, breast changes/lumps, change in vision, confusion, cough, coughing up blood, depression-new, fainting, fatigue, fever, headaches-new, hearing problems, heart murmur, heart rhythm changes, itching, muscle pains/cramps, night sweats, nosebleeds, shortness of breath, skin rash, sleeping problems, sore throat, swelling of feet/legs, swollen lymph glands, thirst - excessive, urination - excessive, urination changes/pain, urine leakage, vision changes, and voice change.    Vital Signs:  Patient profile:   77 year old male Height:      68 inches Weight:      267 pounds BMI:     40.74 BSA:     2.31 Pulse rate:   64 / minute Pulse rhythm:   regular BP sitting:  132 / 80  (left arm) Cuff size:   regular  Vitals Entered By: Ok Anis CMA (March 09, 2009 3:34 PM)   Impression & Recommendations:  Problem # 1:  DYSPEPSIA&OTHER SPEC DISORDERS FUNCTION STOMACH (ICD-536.8) Symptoms could be due to  aerophagia.  When asked about anxiety he claims that he is quite calm and does not feel that this is a factor.  Excess belching  is unlikely to be due to GERD.  Gastroparesis is also a consideration.  Recommendations #1 add simethicone after each meal #2 gastric imaging scan #3 continue Zegerid  Problem # 2:  PERSONAL HISTORY OF COLONIC POLYPS (ICD-V12.72) Plan followup colonoscopy in 5 years  Patient Instructions: 1)  CC Dr. Royston Bake  Appended Document: ECL FOLLOW UP/YF           (3:30 appt/Deborah)    Clinical Lists Changes  Medications: Changed medication from ZEGERID 40-1100 MG CAPS (OMEPRAZOLE-SODIUM BICARBONATE) take 1 tab once daily to ZEGERID 40-1100 MG CAPS (OMEPRAZOLE-SODIUM BICARBONATE) take 1 tab once daily - Signed Rx of ZEGERID 40-1100 MG CAPS (OMEPRAZOLE-SODIUM BICARBONATE) take 1 tab once daily;  #30 x 6;  Signed;  Entered by: Merri Ray CMA (AAMA);  Authorized by: Harold Meckel MD;  Method used: Electronically to [id number] [address] [place], [place]  [address], Ph: [phone] or [phone], Fax: [phone]    Prescriptions: ZEGERID 40-1100 MG CAPS (OMEPRAZOLE-SODIUM BICARBONATE) take 1 tab once daily  #30 x 6   Entered by:   Merri Ray CMA (AAMA)   Authorized by:   Harold Meckel MD   Signed by:   Merri Ray CMA (AAMA) on 03/09/2009   Method used:   Electronically to        [id number] (retail)       [address]       [place], [place]  [address]       Ph: [phone] or [phone]       Fax: [phone]   RxID:   [id number]    Appended Document: Orders Update    Clinical Lists Changes  Problems: Added new problem of GERD (ICD-530.81) Added new problem of ABDOMINAL PAIN, GENERALIZED (ICD-789.07) Orders: Added new Test order of Gastric Emptying Scan (GES) - Signed

## 2010-02-16 NOTE — Miscellaneous (Signed)
  Clinical Lists Changes  Medications: Added new medication of ZEGERID 40-1100 MG CAPS (OMEPRAZOLE-SODIUM BICARBONATE) take 1 tab once daily - Signed Rx of ZEGERID 40-1100 MG CAPS (OMEPRAZOLE-SODIUM BICARBONATE) take 1 tab once daily;  #30 x 2;  Signed;  Entered by: Louis Meckel MD;  Authorized by: Louis Meckel MD;  Method used: Electronically to [id number] [address], [place], [place]  [address], Ph: [phone] or [phone], Fax: [phone]    Prescriptions: ZEGERID 40-1100 MG CAPS (OMEPRAZOLE-SODIUM BICARBONATE) take 1 tab once daily  #30 x 2   Entered and Authorized by:   Louis Meckel MD   Signed by:   Louis Meckel MD on 02/11/2009   Method used:   Electronically to        [id number] (retail)       [address]       [place], [place]  [address]       Ph: [phone] or [phone]       Fax: [phone]   RxID:   [id number]

## 2010-02-16 NOTE — Letter (Signed)
Summary: Results Letter  [facility]  [address] [place], [place] [address]   Phone: [phone]  Fax: [phone]        February 16, 2009 MRN: [id number]    [facility] [address] [place], [place]  [address]    Dear Mr. Choquette,   Your stomach biopsies demonstrated inflammatory changes only.    Please follow the recommendations previously discussed.  Should you have any immediate concerns or questions, feel free to contact me at the office.    Sincerely,  Barbette Hair. Arlyce Dice, M.D., [facility]          Sincerely,  Louis Meckel MD  This letter has been electronically signed by your physician.  Appended Document: Results Letter letter mailed 2.2.11

## 2010-04-12 ENCOUNTER — Other Ambulatory Visit: Payer: Self-pay | Admitting: Cardiovascular Disease

## 2010-04-14 NOTE — Telephone Encounter (Signed)
[address] °

## 2010-04-16 ENCOUNTER — Other Ambulatory Visit: Payer: Self-pay | Admitting: *Deleted

## 2010-04-16 MED ORDER — HYDROCHLOROTHIAZIDE 25 MG PO TABS: 25.0000 mg | ORAL_TABLET | Freq: Every day | ORAL | Status: DC

## 2010-04-23 LAB — CBC
HCT: 44.1 % (ref 39.0–52.0)
Hemoglobin: 15.6 g/dL (ref 13.0–17.0)
MCHC: 35.3 g/dL (ref 30.0–36.0)
MCV: 99.7 fL (ref 78.0–100.0)
Platelets: 156 10*3/uL (ref 150–400)
RBC: 4.43 MIL/uL (ref 4.22–5.81)
RDW: 13.2 % (ref 11.5–15.5)
WBC: 5.7 10*3/uL (ref 4.0–10.5)

## 2010-04-23 LAB — BASIC METABOLIC PANEL
BUN: 18 mg/dL (ref 6–23)
CO2: 26 mEq/L (ref 19–32)
Calcium: 9.6 mg/dL (ref 8.4–10.5)
Chloride: 102 mEq/L (ref 96–112)
Creatinine, Ser: 1.42 mg/dL (ref 0.4–1.5)
GFR calc Af Amer: 59 mL/min — ABNORMAL LOW (ref >60)
GFR calc non Af Amer: 49 mL/min — ABNORMAL LOW (ref >60)
Glucose, Bld: 147 mg/dL — ABNORMAL HIGH (ref 70–99)
Potassium: 3.9 mEq/L (ref 3.5–5.1)
Sodium: 137 mEq/L (ref 135–145)

## 2010-05-10 ENCOUNTER — Ambulatory Visit: Payer: 59 | Admitting: Pain Medicine

## 2010-05-18 ENCOUNTER — Ambulatory Visit: Payer: 59 | Admitting: Pain Medicine

## 2010-05-31 NOTE — Op Note (Signed)
NAMEJOSEH, SJOGREN               ACCOUNT NO.:  [id number]   MEDICAL RECORD NO.:  [id number]          PATIENT TYPE:  OIB   LOCATION:  3534                         FACILITY:  [facility]   PHYSICIAN:  Hewitt Shorts, M.D.DATE OF BIRTH:  [birth year]   DATE OF PROCEDURE:  08/26/2008  DATE OF DISCHARGE:                               OPERATIVE REPORT   PREOPERATIVE DIAGNOSES:  Lumbar stenosis, lumbar spondylosis, lumbar  degenerative disk disease, and neurogenic claudication.   PREOPERATIVE DIAGNOSES:  Lumbar stenosis, lumbar spondylosis, lumbar  degenerative disk disease, and neurogenic claudication.   PROCEDURE:  L3-L5 decompressive lumbar laminectomy with decompression of  the exiting  L2, L3, L4, and L5 nerve roots bilaterally with  microdissection.   SURGEON:  Hewitt Shorts, MD   ASSISTANT:  Clydene Fake, MD   ANESTHESIA:  General endotracheal.   INDICATIONS:  The patient is a 77 year old man who presented with  neurogenic claudication.  He is found to have advanced degenerative  change in the lumbar spine with severe stenosis at L3-L4 and L4-L5  levels.  Decision was made to proceed with decompressive lumbar  laminectomy.   PROCEDURE IN DETAIL:  The patient was brought to the operating room and  placed under general endotracheal anesthesia.  The patient was turned to  a prone position.  Lumbar region was prepped with Betadine soap and  solution and draped in a sterile fashion.  The midline was infiltrated  with local anesthetic with epinephrine and then a midline incision made  over the lower lumbar spine.  Dissection was carried down through the  subcutaneous tissue.  Bipolar cautery and electrocautery was used to  maintain hemostasis.  Dissection was carried down to the lumbar fascia,  which was incised bilaterally.  The paraspinal muscles were dissected  from the spinous process and lamina in a subperiosteal fashion.  A  localized x-ray was taken of the L3, L4, and  L5 spinous process and  lamina were identified.  We proceeded with laminectomy using double-  action rongeurs and the Stryker high-speed drill.  Using magnification  with microdissection and microsurgical technique, we carefully removed  the thinned lamina.  Ligamentum flavum was markedly thickened with  partial calcification, spondylotic overgrowth.  We carefully removed the  remaining lamina, taking care to leave the underlying thecal sac  undisturbed.  The segmental ligamentum flavum was removed from the  dorsal aspect of the canal and then we carefully dissected laterally  localizing the lateral aspect to the ligamentum flavum further  decompressing the exiting neuroforamen and exiting nerve roots, and we  were able to decompress the exiting L2, L3, L4, and L5 nerve roots  bilaterally.  Care was taken to leave the facet joints intact to low.  The facet joints had advanced degeneration bilaterally at L4-5 worse  than L3-4.  Once decompression was completed, the wound was irrigated  extensively with saline solution and subsequently with bacitracin  solution.  Good hemostasis was established.  We did place a layer of  Gelfoam with thrombin in the laminectomy defect and then proceeded with  closure.  Paraspinal muscles were  approximated with interrupted undyed 1  Vicryl sutures.  Deep fascia was closed with interrupted undyed #1  Vicryl sutures.  Scarpa fascia was closed with interrupted undyed #1  Vicryl sutures.  The subcutaneous and subcuticular were closed with  interrupted inverted 2-0 undyed Vicryl sutures.  The skin was  approximated with Dermabond.  The wound was dressed with  Adaptic and sterile gauze.  The procedure was tolerated well.  Following  surgery, the patient was turned back to the supine position, to be  reversed from the anesthetic, extubated, and transferred to the recovery  room for further care.  Estimated blood loss 100 mL.  Sponge and needle  count were  correct.      Hewitt Shorts, M.D.  Electronically Signed     Hewitt Shorts, M.D.  Electronically Signed    RWN/MEDQ  D:  08/26/2008  T:  08/27/2008  Job:  [id number]

## 2010-05-31 NOTE — Assessment & Plan Note (Signed)
[facility]                            CARDIOLOGY OFFICE NOTE   [name], [name]                      MRN:          [id number]  DATE:05/27/2007                            DOB:          February 23, 1932    HISTORY OF PRESENT ILLNESS:  Harold Jennings was seen in follow-up at the  [facility] office on May 27, 2007.  Mr. Riordan is a delightful  77 year old gentleman with coronary artery disease and history of silent  inferior MI.  He has marked obesity, hypertension and dyslipidemia.  He  presented today for regular follow-up.  He is asymptomatic but is only  minimally active at this time.  He is limited by low back problems.  He  has actually been considering lumbar surgery for spinal stenosis.  He is  limited by leg pain and weakness as well as back trouble.  He  specifically denies chest pain or pressure.  He has exertional dyspnea  with moderate level activity, but is stable over time.  He denies  orthopnea, PND or edema.  Overall, he feels relatively well and has no  specific complaints today.   MEDICATIONS:  1. Doxazosin 4 mg at bedtime.  2. Levothyroxine 50 mcg daily.  3. Aspirin 81 mg daily.  4. Zetia 10 mg daily.  5. Hydrochlorothiazide 25 mg daily.  6. Lisinopril 10 mg daily.  7. Atenolol 25 mg daily.  8. NyQuil at bedtime.   ALLERGIES:  NKDA.   PHYSICAL EXAMINATION:  GENERAL:  The patient is alert and oriented.  He  is in no acute distress.  VITAL SIGNS:  Weights 263, blood pressure is 128/84, heart rate 46,  respiratory rates 18.  HEENT:  Normal.  NECK:  Normal carotid upstrokes without bruits.  Jugular venous pressure  is difficult to measure but appears normal.  LUNGS:  Clear bilaterally.  HEART:  Bradycardic and regular without murmurs or gallops.  ABDOMEN:  Soft, obese, nontender.  EXTREMITIES:  There is trace pretibial edema bilaterally.  SKIN:  Warm and dry without rash.   STUDIES:  EKG shows marked sinus bradycardia  with first-degree AV block  and right bundle branch block.   RECORD REVIEW:  Adenosine Myoview from August 2007 demonstrates small  fixed inferior defect with no evidence of ischemia.  LVEF was preserved  at 52%.   Creatinine 1.5, total bilirubin 1.6, alkaline phosphatase 41, AST 59,  ALT 101, cholesterol 190, triglycerides 100, HDL 40, LDL 131.   ASSESSMENT:  Mr. Shean remained stable from a cardiac standpoint.  His  cardiac problems are as follows:  1. Coronary artery disease.  Silent inferior MI as noted.  He has had      a cardiac catheterization that demonstrated a chronically occluded      right coronary artery with left-to-right collaterals.  Adenosine      Myoview within the last 2 years showed a small fixed inferior      defect with no inducible ischemia.  Continue current medical      therapy without changes.  He is on an antiplatelet with low-dose  aspirin as well as an ACE inhibitor and beta blocker.  I would like      to see him back in 6 months for follow-up.  At that time, I may      consider repeat stress study since he has a past history of silent      coronary disease and he may not have symptoms to guide further      testing.  2. Dyslipidemia.  Lipids are elevated with an LDL of 131.  His goal      LDL should be less than 100 and ideally less than 70.  Neither Dr.      Chilton Si nor I are in favor of starting a statin due to his elevated      liver function tests.  Will continue Zetia.  Encouraged weight      loss.  3. Surgical risk.  Mr. Dalton surgical risk from cardiac perspective      is low.  He has preserved LV function is stable on his current      medical regimen.  His stress study from 2007 showed no ischemia.  I      do not think he needs further risk stratification at this point.      If he proceeds with surgery, he should remain on his beta blocker      without interruption and therapy.  I really think he would do      better with surgery if he could  lose 30-40 pounds prior to his      operation, but this likely is not feasible for him.  I did review      this in detail today, and I am hopeful that that will provide some      motivation for weight loss.  4. For follow-up, I will see Mr. Hasler back in 6 months or sooner if      any new problems arise.     Veverly Fells. Excell Seltzer, MD  Electronically Signed    MDC/MedQ  DD: 05/27/2007  DT: 05/27/2007  Job #: [id number]   cc:   Erskine Speed, M.D.  Hewitt Shorts, M.D.

## 2010-05-31 NOTE — Assessment & Plan Note (Signed)
[name]                            CARDIOLOGY OFFICE NOTE   Jennings, Harold                      MRN:          [id number]  DATE:01/27/2008                            DOB:          [birth year]    REASON FOR VISIT:  Followup CAD.   HISTORY OF PRESENT ILLNESS:  Harold Jennings is a 77 year old gentleman with  coronary artery disease and history of silent inferior wall MI.  His  other cardiovascular comorbidities include obesity, hypertension, and  dyslipidemia.  He has had elevated liver function test in the past and  has not been tolerant of statins secondary to his LFTs.  He struggled  with obesity and has limitation from spinal stenosis, so he is not able  to exercise.  He has chronic exertional dyspnea that is stable overtime  and occurs with moderate level activity such as climbing stairs.  He  denies orthopnea, PND, edema, or chest pain.  He has no other complaints  today.   MEDICATIONS:  1. Doxazosin 4 mg at bedtime.  2. L-thyroxine 50 mcg.  3. Aspirin 81 mg.  4. Zetia 10 mg.  5. Hydrochlorothiazide 25 mg.  6. Lisinopril 10 mg.  7. Atenolol 25 mg.  8. Aleve 2 every morning.   ALLERGIES:  NKDA.   PHYSICAL EXAMINATION:  GENERAL:  The patient is alert and oriented in no  acute distress.  VITAL SIGNS:  Weight is 264 pounds, blood pressure is 132/80, heart rate  is 53, and respiratory rate is 12.  HEENT:  Normal.  NECK:  Normal carotid upstrokes.  No bruits.  JVP normal.  LUNGS:  Clear bilaterally.  HEART:  Regular rate and rhythm.  No murmurs or gallops.  ABDOMEN:  Soft and nontender.  No organomegaly.  Obese, positive bowel  sounds.  EXTREMITIES:  No clubbing, cyanosis, or edema.  Peripheral pulses are  intact and equal.   EKG shows sinus bradycardia with right bundle-branch block and age-  indeterminate inferior MI.   ASSESSMENT:  1. Coronary artery disease.  The patient had a Myoview scan in August      2007, that showed no  ischemia.  We will continue on current medical      therapy without changes.  He is having no angina.  2. Dyslipidemia.  The patient is statin intolerant due to his elevated      LFTs.  Continue Zetia.  We will check lipids and LFTs today.  3. Hypertension.  Blood pressure is under good control on the      combination of lisinopril and atenolol.  4. Obesity.  We had a long discussion regarding the importance of      weight reduction.  He is going to try the [facility] Diet.   For followup, I would like to see Harold Jennings back in 6 months.     Veverly Fells. Excell Seltzer, MD  Electronically Signed    MDC/MedQ  DD: 01/27/2008  DT: 01/28/2008  Job #: [id number]   cc:   Erskine Speed, M.D.

## 2010-05-31 NOTE — Assessment & Plan Note (Signed)
[facility]                            CARDIOLOGY OFFICE NOTE   LYNKIN, SAINI                      MRN:          [id number]  DATE:08/06/2006                            DOB:          09/15/1932    Harold Jennings presents for followup today as an outpatient at the [facility].  I initially saw him back in August 2007 for  complaints of chest pain.  He has known coronary artery disease and had  sustained a silent inferior wall MI at some point.  He had a cardiac  catheterization in 2000 that demonstrated an occluded right coronary  artery that filled from collaterals with an LVEF estimated in the range  of 40-50%.  His stress nuclear study performed September 11, 2005,  demonstrated a small inferior infarction without ischemia and preserved  LV function with a gated EF of 52%.   From a symptomatic standpoint, Harold Jennings main complaint is that of  exertional dyspnea.  He has a difficult time quantifying the duration  but notes that he is dyspneic with one flight of stairs.  He has no  resting symptoms.  He had no further chest discomfort.  He denies  orthopnea, PND or edema.  He has no other complaints.   Current medications include:  1. Lisinopril 10 mg daily.  2. Atenolol 25 mg daily.  3. Doxazosin 4 mg at bedtime.  4. L-thyroxine 50 mcg daily.  5. Aspirin 81 mg daily.  6. Zetia 10 mg daily.   ALLERGIES:  No known drug allergies.   PHYSICAL EXAMINATION:  The patient is alert and oriented.  He is an  obese male in no acute distress.  His weight is 261 pounds, blood  pressure is 140/72, heart rate is 53, respiratory rate is 16.  HEENT:  Normal.  NECK:  Normal carotid upstrokes without bruits.  Jugular venous pressure  is normal.  LUNGS:  Clear to auscultation bilaterally.  HEART:  The apex is not palpable.  The heart is regular rate and rhythm  without murmurs or gallops.  ABDOMEN:  Soft, obese, nontender, no organomegaly.  EXTREMITIES:  No cyanosis, clubbing or edema.  Peripheral pulses are 2+  and equal throughout.   EKG shows sinus bradycardia with right bundle-branch block and a  suggestion of LVH.   Lipids from May 5 show total cholesterol of 227, HDL 52, LDL 153.  AST  and ALT were normal at 33 and 46 respectively.   ASSESSMENT:  1. Harold Jennings is a 77 year old gentleman with exertional dyspnea.  I      suspect his weight is playing a major factor as he has marked      obesity.  It is also possible that with his history of hypertension      and coronary artery disease that he may have a degree of diastolic      dysfunction.  I have elected to give him a trial of      hydrochlorothiazide to optimize his blood pressure control and also      a low-dose diuretic may help to optimize  his LV filling pressures.      I counseled him regarding adequate potassium and would like to      recheck a basic metabolic panel in 2 weeks to check his potassium      level.  He will have an echocardiogram done at that time as well to      evaluate LV function and also his diastolic parameters.  I do not      suspect any valvular heart disease is playing a role, based on his      unremarkable physical exam.  We also had a long discussion      regarding his weight and the importance of weight reduction via      diet and exercise.  2. Dyslipidemia.  Harold Jennings has been intolerant to statins.  He is      currently on Zetia.  He needs to make aggressive lifestyle changes      to attempt to improve his cholesterol.  Also may consider red yeast      rice.  Will review at his return visit.  3. Hypertension.  Discussion as above.  Currently on lisinopril and      atenolol.  Adding hydrochlorothiazide 25 mg daily.   I would like to see Harold Jennings back in 3 months and at that time will  review his echocardiogram with them as well as his laboratory data.  We  also need to keep a close eye on his lipids as they are not anywhere   near goal for someone with coronary artery disease.     Veverly Fells. Excell Seltzer, MD  Electronically Signed    MDC/MedQ  DD: 08/06/2006  DT: 08/06/2006  Job #: [id number]   cc:   Erskine Speed, M.D.

## 2010-05-31 NOTE — Assessment & Plan Note (Signed)
[facility]                            CARDIOLOGY OFFICE NOTE   Harold, Jennings                      MRN:          [id number]  DATE:10/17/2006                            DOB:          August 13, 1932    Harold Jennings returns for followup at the [facility] Cardiology office on  October 17, 2006.  Harold Jennings is stable with no change in his symptoms  since I last saw him in July.  Harold Jennings has a history of coronary  artery disease and had a silent inferior wall myocardial infarction.  He  underwent a cardiac catheterization back in 2000 that demonstrated a  collateralized right coronary artery that was chronically occluded.  His  left ventricular ejection fraction has been in the range of 40% to 50%.  He underwent a stress nuclear study in August of 2007 that showed no  ischemia.  From a symptomatic standpoint, Harold Jennings continues to have  some exertional dyspnea and generalized fatigue.  He is dyspneic with 1  flight of stairs.  He has no resting symptoms.  He has no chest  discomfort.  He has no other complaints.   CURRENT MEDICATIONS:  1. Lisinopril 10 mg daily.  2. Atenolol 25 mg daily.  3. Doxazosin 4 mg at bedtime.  4. L-thyroxine 50 mcg daily.  5. Aspirin 81 mg daily.  6. Zetia 10 mg daily.  7. Hydrochlorothiazide 25 mg daily.  8. Benadryl at bedtime.   ALLERGIES:  No known drug allergies.   PHYSICAL EXAMINATION:  The patient is alert and oriented, he is in no  acute distress.  He is an obese white male.  The weight is 264 pounds, blood pressure  116/60 in the right, 128/66 in the left, heart rate 60, respiratory rate  16.  HEENT:  Normal.  NECK:  Normal carotid upstrokes.  Jugular venous pressure is normal.  LUNGS:  Clear to auscultation bilaterally.  HEART:  Regular rate and rhythm without murmurs or gallops.  ABDOMEN:  Soft, obese, nontender, no bruits.  EXTREMITIES:  No cyanosis, clubbing, or edema.  Peripheral pulses are 2+  and  equal throughout.   Labs were reviewed and demonstrated mildly elevated liver function tests  with an AST of 46 and an ALT of 86.  His total cholesterol is 205,  triglycerides were 136, HDL 38, LDL 148.   ASSESSMENT:  1. Coronary artery disease.  No evidence of ischemia or signs of      angina at this point.  Continue with medical therapy which includes      an angiotensin-converting enzyme inhibitor, beta blocker, and      aspirin.  For discussion of cholesterol management, see below.  2. Hypertension.  Blood pressure is in the optimal range on a      combination of lisinopril, atenolol and hydrochlorothiazide.  3. Dyslipidemia.  Currently he is taking Zetia.  Unfortunately, Mr.      Jennings has elevated transaminases, and I do not feel comfortable      starting him on statin therapy.  He tells me that Dr. Chilton Si is in  agreement, and does not want him on statins.  I suggested that he      try red yeast rice along with Zetia.  Will follow him up with      lipids and liver function tests at the time of his return visit in      6 months.  I will defer whether any further investigation is needed      into his elevated transaminases to Dr. Chilton Si.  Harold Jennings would be      particularly at risk for steatohepatitis, and it is also possible      that his alcohol consumption is contributing to his increased      transaminases.   For followup, I would like to see Harold Jennings back in 6 months or sooner  if any new problems arise.  We spent a great deal of time today  reviewing strategies for weight loss with carbohydrate restriction.  He  informs me that he is unable to exercise due to spinal stenosis and  limitations with walking, and he is pursuing an evaluation for back  surgery.     Veverly Fells. Excell Seltzer, MD  Electronically Signed    MDC/MedQ  DD: 10/17/2006  DT: 10/17/2006  Job #: [id number]   cc:   Erskine Speed, M.D.

## 2010-06-03 NOTE — Assessment & Plan Note (Signed)
[facility]                              CARDIOLOGY OFFICE NOTE   Harold Jennings, Harold Jennings                      MRN:          [id number]  DATE:09/08/2005                            DOB:          11-26-1932    CHIEF COMPLAINT:  Chest pain.   HISTORY OF PRESENT ILLNESS:  Harold Jennings was seen today as an acute illness  visit, as he called in complaining of chest discomfort.  He presents for  evaluation, as he has had chest discomfort for approximately 1 week.  He  describes eating 2 tomato sandwiches 1 week ago, and then following this  developed pressure in his chest.  He really has had a mild pressure-like  sensation in his chest almost constantly for the past week.  He states that  it is non-exertional.  He also has felt fatigued and just in general has not  been feeling well for the last several weeks.  He does have some exertional  dyspnea, but it is more chronic and it is unrelated to his chest pressure.  He does get some relief after belching.  As some of his symptoms have  clearly sounded GI in nature, he was started on Aciphex 1 week ago, but this  has not relieved the sensation in his chest.   PAST MEDICAL HISTORY:  1. His past medical history is pertinent for coronary artery disease.  He      sustained a silent myocardial infarction and underwent a heart      catheterization in 2000 that demonstrated an occluded mid right      coronary artery that filled via left-sided collaterals.  His left      ventricular ejection fraction has been in the range of 40% to 50%.  2. Dyslipidemia, on chronic statin therapy.  3. Hypertension.   PHYSICAL EXAMINATION:  GENERAL:  Harold Jennings is alert and oriented to all  spheres.  He is in no acute distress.  He is a pleasant gentleman.  VITAL SIGNS:  Blood pressure is 160/84, on my recheck his blood pressure was  152/90.  Heart rate initially was 58, on my recheck was 65.  His weight is  262 pounds.  Respiratory  rate is 16.  EYES:  Sclerae are anicteric, conjunctivae are pink.  ENT:  Moist oral mucosa, oropharynx is clear.  LUNGS:  Clear to auscultation bilaterally.  CARDIOVASCULAR:  The heart is regular rate and rhythm without murmurs or  gallops.  ABDOMEN:  Soft, obese, nontender, no organomegaly.  EXTREMITIES:  There is a 1+ pretibial edema bilaterally.  Peripheral pulses  are 2+ and equal throughout.   EKG demonstrates normal sinus rhythm with a right bundle branch block  pattern.  There are nonspecific T wave changes present as well.  This is  unchanged in comparison to his EKG from March 04, 2003.   IMPRESSION AND PLAN:  Harold Jennings is a 77 year old gentleman with known  coronary artery disease, presenting with atypical chest pain.  His symptoms  really do sound more gastrointestinal by history.  However, he has had no  response after  1 week of proton pump inhibitors therapy.  In the setting of  a prior silent myocardial infarction, and a gentleman who may have atypical  symptoms, I think it is reasonable to perform a stress study, and I have  ordered an adenosine myocardial perfusion scan to evaluate him for a  significant area of ischemia.  In addition, I increased his atenolol from 25  mg to 50 mg daily.  He will continue on aspirin 81 mg daily.  I have advised  him that if he experiences increased fatigue with the atenolol, which he did  have some concern about, then he could go back to his regular 25 mg dose.  I  think he would benefit from better blood pressure control, and if he is  having ischemic pain the increased beta blocker will be in his benefit as  well.   We will follow up with Harold Jennings by telephone after his stress test results  are available.  If he has increased discomfort, he was advised to seek  immediate attention.                                 Micheline Chapman, MD    MDC/MedQ  DD:  09/08/2005  DT:  09/09/2005  Job #:  [id number]   cc:   Salvadore Farber, MD  Erskine Speed, MD

## 2010-07-13 ENCOUNTER — Ambulatory Visit: Payer: 59 | Admitting: Pain Medicine

## 2010-08-01 ENCOUNTER — Ambulatory Visit: Payer: 59 | Admitting: Pain Medicine

## 2010-08-30 ENCOUNTER — Ambulatory Visit: Payer: 59 | Admitting: Pain Medicine

## 2011-03-30 ENCOUNTER — Encounter: Payer: Self-pay | Admitting: *Deleted

## 2011-03-30 ENCOUNTER — Telehealth: Payer: Self-pay | Admitting: Cardiovascular Disease

## 2011-03-30 NOTE — Telephone Encounter (Signed)
Increased sob, pcp out of town, Dr cooper out this week, Pt has app set with Dr Excell Seltzer but feels he needs to be seen sooner,  denies LE swelling and  unable to tell if more  Fluid in abdomen due to obesity. States that for past week he is unable to do anything without being out of breath/ pt able to talk on phone but looses breath with walking and putting on socks. Denies CP, c/o lower back pain but no change in urination habits, app made with Norma Fredrickson NP

## 2011-03-30 NOTE — Telephone Encounter (Signed)
Pt calling re SOB, denies any other symptoms, pls call

## 2011-03-31 ENCOUNTER — Ambulatory Visit (INDEPENDENT_AMBULATORY_CARE_PROVIDER_SITE_OTHER): Payer: Medicare Other | Admitting: Nurse Practitioner

## 2011-03-31 ENCOUNTER — Ambulatory Visit
Admission: RE | Admit: 2011-03-31 | Discharge: 2011-03-31 | Disposition: A | Discharge status: Home / Self Care | Payer: Medicare Other | Source: Ambulatory Visit | Attending: Nurse Practitioner | Admitting: Nurse Practitioner

## 2011-03-31 ENCOUNTER — Telehealth: Payer: Self-pay | Admitting: Cardiovascular Disease

## 2011-03-31 ENCOUNTER — Encounter: Payer: Self-pay | Admitting: Nurse Practitioner

## 2011-03-31 VITALS — BP 108/72 | HR 63 | Ht 68.0 in | Wt 274.0 lb

## 2011-03-31 DIAGNOSIS — I519 Heart disease, unspecified: Secondary | ICD-10-CM

## 2011-03-31 DIAGNOSIS — R0600 Dyspnea, unspecified: Secondary | ICD-10-CM

## 2011-03-31 DIAGNOSIS — R0609 Other forms of dyspnea: Secondary | ICD-10-CM

## 2011-03-31 LAB — CBC WITH DIFFERENTIAL/PLATELET
Basophils Absolute: 0 10*3/uL (ref 0.0–0.1)
Basophils Relative: 0.4 % (ref 0.0–3.0)
Eosinophils Absolute: 0.3 10*3/uL (ref 0.0–0.7)
Eosinophils Relative: 4.4 % (ref 0.0–5.0)
HCT: 43 % (ref 39.0–52.0)
Hemoglobin: 14.7 g/dL (ref 13.0–17.0)
Lymphocytes Relative: 28.2 % (ref 12.0–46.0)
Lymphs Abs: 1.7 10*3/uL (ref 0.7–4.0)
MCHC: 34.2 g/dL (ref 30.0–36.0)
MCV: 100.6 fl — ABNORMAL HIGH (ref 78.0–100.0)
Monocytes Absolute: 0.7 10*3/uL (ref 0.1–1.0)
Monocytes Relative: 11.1 % (ref 3.0–12.0)
Neutro Abs: 3.3 10*3/uL (ref 1.4–7.7)
Neutrophils Relative %: 55.9 % (ref 43.0–77.0)
Platelets: 140 10*3/uL — ABNORMAL LOW (ref 150.0–400.0)
RBC: 4.27 Mil/uL (ref 4.22–5.81)
RDW: 14.6 % (ref 11.5–14.6)
WBC: 5.9 10*3/uL (ref 4.5–10.5)

## 2011-03-31 LAB — BASIC METABOLIC PANEL
BUN: 15 mg/dL (ref 6–23)
CO2: 26 mEq/L (ref 19–32)
Calcium: 9.2 mg/dL (ref 8.4–10.5)
Chloride: 105 mEq/L (ref 96–112)
Creatinine, Ser: 1.3 mg/dL (ref 0.4–1.5)
GFR: 55.91 mL/min — ABNORMAL LOW (ref >60.00)
Glucose, Bld: 93 mg/dL (ref 70–99)
Potassium: 4.1 mEq/L (ref 3.5–5.1)
Sodium: 139 mEq/L (ref 135–145)

## 2011-03-31 LAB — BRAIN NATRIURETIC PEPTIDE: Pro B Natriuretic peptide (BNP): 355 pg/mL — ABNORMAL HIGH (ref 0.0–100.0)

## 2011-03-31 MED ORDER — FUROSEMIDE 40 MG PO TABS: 40.0000 mg | ORAL_TABLET | Freq: Every day | ORAL | Status: DC

## 2011-03-31 NOTE — Telephone Encounter (Signed)
Please return call to patient   Patient would like to speak with Harold Jennings., he can be reached  On mobile # [phone]

## 2011-03-31 NOTE — Telephone Encounter (Signed)
Spoke with pt and reviewed lab work and chest X-ray results and information from Norma Fredrickson, NP regarding these with pt. He has no additional questions.

## 2011-03-31 NOTE — Patient Instructions (Signed)
We are going to arrange for a CXR today. Go to [facility] [employer] may just walk in.  We are going to get an ultrasound of your heart.  We are going to check labs today.  We are going to start Lasix 40 mg one each day in the am.  Weigh every day and keep a record.  Stay on your other medicines.  Keep your next appointment with Dr. Excell Seltzer.  I would encourage you to cut back on your alcohol and salt use.  Call the [facility] office at [phone] if you have any questions, problems or concerns.

## 2011-03-31 NOTE — Assessment & Plan Note (Signed)
Patient presents with increasing DOE with weight gain, abdominal bloating and some edema.  Has known LV dysfunction. He is probably consuming too much salt and alcohol. We will need to update his echo. We are checking labs today. I have sent him for a CXR today. We are starting him on Lasix 40 mg daily. He will keep his appointment with Dr. Excell Seltzer for the 26th. I have asked him to cut back on the alcohol and salt. He may end up needing repeat stress testing but will defer to Dr. Excell Seltzer. Patient is agreeable to this plan and will call if any problems develop in the interim.

## 2011-03-31 NOTE — Progress Notes (Signed)
Harold Jennings Date of Birth: 09-06-1932 Medical Record #[id number]  History of Present Illness: Harold Jennings is seen today for a work in visit. He is seen for Dr. Excell Seltzer. He is a elderly male with a known history of CAD with remote cath in 2000 showing a total mid RCA with collaterals via the LCX. EF was 40-45% at that time. His last myoview was in 2010 and showed inferior wall scar with no evidence of ischemia. His EF was 52%. His other issues include HTN, HLD and hypothyroidism.   He comes in today. He is here with his wife. He says he is more short of breath. This has been an apparent chronic complaint but he says it has gotten much worse in the past week or so. He says now he can hardly do anything. He has a dry cough. No fever or chills. Appears more flushed in the face. No lower extremity edema but his belly feels more bloated. Salt use is questionable. He eats out a lot. He does consume 2 to 4 glasses of wine a day. No chest pain. Not active at all. No PND or orthopnea. His weight is up. He has a visit with Dr. Excell Seltzer later this month.   Current Outpatient Prescriptions on File Prior to Visit  Medication Sig Dispense Refill  . aspirin 81 MG tablet Take 81 mg by mouth daily.      Marland Kitchen atenolol (TENORMIN) 25 MG tablet Take 25 mg by mouth daily.      Marland Kitchen doxazosin (CARDURA) 8 MG tablet Take 8 mg by mouth at bedtime.      Marland Kitchen ezetimibe (ZETIA) 10 MG tablet Take 10 mg by mouth daily.      . hydrochlorothiazide 25 MG tablet Take 1 tablet (25 mg total) by mouth daily.  30 tablet  2  . lisinopril (PRINIVIL,ZESTRIL) 10 MG tablet Take 10 mg by mouth daily.      . Multiple Vitamin (MULTIVITAMIN) tablet Take 1 tablet by mouth daily.      Marland Kitchen omeprazole-sodium bicarbonate (ZEGERID) 40-1100 MG per capsule Take 1 capsule by mouth daily before breakfast.        No Known Allergies  Past Medical History  Diagnosis Date  . Dyslipidemia   . HTN (hypertension)   . Obesity   . CAD (coronary artery disease)    Silent MI sometime prior to 2000. S/P cath in 2000 showing a total mid RCA with collaterals from the LCX. EF is 40 to50%; Last Myoview in 2010 showing inferior scar without ischemia. EF was 52%.  . Increased liver enzymes     felt to be a fatty liver per ultrasound in 2011    Past Surgical History  Procedure Date  . Total hip arthroplasty   . Eye surgery   . Lower back surgery     spinal stenosis  . Shoulder surgery     History  Smoking status  . Never Smoker   Smokeless tobacco  . Not on file    History  Alcohol Use  . Yes    2-4 glasses of wine daily    Family History  Problem Relation Age of Onset  . Stroke Father   . Heart attack Father   . Hypertension Father   . Diabetes Father   . Drug abuse Mother   . Hypertension Brother   . Hypertension Brother   . Hypertension Brother   . Hypertension Sister     Review of Systems: The review of systems  is per the HPI.  All other systems were reviewed and are negative.  Physical Exam: BP 108/72  Pulse 63  Ht 5\' 8"  (1.727 m)  Wt 274 lb (124.286 kg)  BMI 41.66 kg/m2  SpO2 92% His weight is up 9 pounds since his last visit. Oxygen sats are 90 to 94%.  Patient is very pleasant and in no acute distress. He is obese. Skin is warm and dry. Color is normal. Face is ruddy. HEENT is unremarkable. Normocephalic/atraumatic. PERRL. Sclera are nonicteric. Neck is supple. No masses. No JVD. Lungs are fairly clear. Cardiac exam shows a regular rate and rhythm. His heart tones are distant. Abdomen is obese but soft. Extremities are quite full with trace edema. Gait and ROM are intact. No gross neurologic deficits noted.  LABORATORY DATA: EKG today shows sinus with a RBBB. He has inferior ST and T wave changes which appear unchanged.   Assessment / Plan:

## 2011-04-11 ENCOUNTER — Encounter: Payer: Self-pay | Admitting: Cardiovascular Disease

## 2011-04-11 ENCOUNTER — Ambulatory Visit (INDEPENDENT_AMBULATORY_CARE_PROVIDER_SITE_OTHER): Payer: Medicare Other | Admitting: Cardiovascular Disease

## 2011-04-11 ENCOUNTER — Ambulatory Visit (HOSPITAL_COMMUNITY): Payer: Medicare Other | Attending: Cardiology

## 2011-04-11 ENCOUNTER — Other Ambulatory Visit: Payer: Self-pay

## 2011-04-11 VITALS — BP 134/68 | HR 64 | Ht 67.0 in | Wt 265.8 lb

## 2011-04-11 DIAGNOSIS — R0602 Shortness of breath: Secondary | ICD-10-CM

## 2011-04-11 DIAGNOSIS — I2510 Atherosclerotic heart disease of native coronary artery without angina pectoris: Secondary | ICD-10-CM | POA: Insufficient documentation

## 2011-04-11 DIAGNOSIS — I10 Essential (primary) hypertension: Secondary | ICD-10-CM

## 2011-04-11 DIAGNOSIS — R0989 Other specified symptoms and signs involving the circulatory and respiratory systems: Secondary | ICD-10-CM

## 2011-04-11 DIAGNOSIS — I509 Heart failure, unspecified: Secondary | ICD-10-CM

## 2011-04-11 DIAGNOSIS — I5032 Chronic diastolic (congestive) heart failure: Secondary | ICD-10-CM

## 2011-04-11 DIAGNOSIS — R0600 Dyspnea, unspecified: Secondary | ICD-10-CM

## 2011-04-11 DIAGNOSIS — E669 Obesity, unspecified: Secondary | ICD-10-CM

## 2011-04-11 DIAGNOSIS — I519 Heart disease, unspecified: Secondary | ICD-10-CM

## 2011-04-11 DIAGNOSIS — E785 Hyperlipidemia, unspecified: Secondary | ICD-10-CM | POA: Insufficient documentation

## 2011-04-11 DIAGNOSIS — R0609 Other forms of dyspnea: Secondary | ICD-10-CM | POA: Insufficient documentation

## 2011-04-11 MED ORDER — LISINOPRIL 20 MG PO TABS: 20.0000 mg | ORAL_TABLET | Freq: Every day | ORAL | Status: DC

## 2011-04-11 NOTE — Assessment & Plan Note (Signed)
The patient has class 2-3 symptoms, some of which is clearly due to his obesity. However, he does have evidence of congestive heart failure with an elevated BNP and volume overload now responding well to oral diuretic therapy. I recommended that the patient continue on furosemide 40 mg daily. He will stop his hydrochlorothiazide. I've asked him to increase his lisinopril to 20 mg daily. I like him to have followup lab work in 4 weeks and see Norma Fredrickson at that time for close followup of his CHF.

## 2011-04-11 NOTE — Patient Instructions (Signed)
Your physician recommends that you schedule a follow-up appointment in: 4 weeks with Norma Fredrickson, NP  Your physician has recommended you make the following change in your medication: Stop Hydrochlorothiazide.  Increase Lisinopril to 20 mg by mouth daily  Your physician recommends that you return for lab work in: 4 weeks on day of appt with Norma Fredrickson.  BMP, BNP

## 2011-04-11 NOTE — Progress Notes (Signed)
   HPI:  78 year old gentleman presented for followup evaluation. He has a mild ischemic cardiomyopathy after a silent myocardial infarction remotely. He was evaluated last week by Norma Fredrickson for progressive dyspnea. There was concern about congestive heart failure considering his known LV dysfunction and noncompliance with diet with a background of marked obesity. He was diuresed with oral Lasix and an echocardiogram was ordered.  His echo was essentially unremarkable with normal LV systolic function. There was mild mitral valve prolapse with mild mitral regurgitation present. There were no other significant abnormalities.  BNP was elevated at 355. Chest x-ray showed no evidence of acute cardiopulmonary disease.  The patient continues to have dyspnea, but he feels much better than he did a few weeks ago. His weight is down 9 pounds. He's been trying to modify his diet with low salt and less alcohol.  His dyspnea is back to baseline. He denies chest pain, orthopnea, or PND. He said the chest pain.  Outpatient Encounter Prescriptions as of 04/11/2011  Medication Sig Dispense Refill  . aspirin 81 MG tablet Take 81 mg by mouth daily.      Marland Kitchen atenolol (TENORMIN) 25 MG tablet Take 25 mg by mouth daily.      Marland Kitchen doxazosin (CARDURA) 8 MG tablet Take 8 mg by mouth at bedtime.      Marland Kitchen ezetimibe (ZETIA) 10 MG tablet Take 10 mg by mouth daily.      . furosemide (LASIX) 40 MG tablet Take 1 tablet (40 mg total) by mouth daily.  30 tablet  11  . gabapentin (NEURONTIN) 600 MG tablet Take 300 mg by mouth 2 (two) times daily.      Marland Kitchen levothyroxine (SYNTHROID, LEVOTHROID) 75 MCG tablet Take 75 mcg by mouth daily.      Marland Kitchen lisinopril (PRINIVIL,ZESTRIL) 10 MG tablet Take 10 mg by mouth daily.      Marland Kitchen omeprazole-sodium bicarbonate (ZEGERID) 40-1100 MG per capsule Take 1 capsule by mouth daily before breakfast.      . hydrochlorothiazide 25 MG tablet Take 1 tablet (25 mg total) by mouth daily.  30 tablet  2  . Multiple Vitamin  (MULTIVITAMIN) tablet Take 1 tablet by mouth daily.        No Known Allergies  Past Medical History  Diagnosis Date  . Dyslipidemia   . HTN (hypertension)   . Obesity   . CAD (coronary artery disease)     Silent MI sometime prior to 2000. S/P cath in 2000 showing a total mid RCA with collaterals from the LCX. EF is 40 to50%; Last Myoview in 2010 showing inferior scar without ischemia. EF was 52%.  . Increased liver enzymes     felt to be a fatty liver per ultrasound in 2011    ROS: Negative except as per HPI  BP 134/68  Pulse 64  Ht 5\' 7"  (1.702 m)  Wt 120.566 kg (265 lb 12.8 oz)  BMI 41.63 kg/m2  PHYSICAL EXAM: Pt is alert and oriented, obese gentleman in NAD HEENT: normal Neck: JVP - normal, carotids 2+= without bruits Lungs: CTA bilaterally CV: RRR without murmur or gallop Abd: soft, NT, Positive BS, no hepatomegaly Ext: no C/C/E, distal pulses intact and equal Skin: warm/dry no rash  EKG:  Sinus rhythm with first-degree AV block 64 beats per minute, right bundle branch block, cannot rule out age-indeterminate inferior MI.  ASSESSMENT AND PLAN:

## 2011-04-11 NOTE — Assessment & Plan Note (Signed)
The patient has a history of silent infarction. He is managed medically with aspirin and atenolol. He has no ischemic symptoms or signs of clinical deterioration.

## 2011-04-11 NOTE — Assessment & Plan Note (Signed)
Long discussion with the patient about the seriousness of his obesity and the detrimental effects on his overall health.  He will be referred to a nutritionist. We talked about the use of a recumbent bike since he cannot bear weight very easily.

## 2011-05-03 ENCOUNTER — Ambulatory Visit: Payer: 59 | Admitting: *Deleted

## 2011-05-12 ENCOUNTER — Ambulatory Visit: Payer: 59 | Admitting: Cardiovascular Disease

## 2011-05-12 ENCOUNTER — Encounter: Payer: Self-pay | Admitting: Nurse Practitioner

## 2011-05-12 ENCOUNTER — Other Ambulatory Visit: Payer: Medicare Other

## 2011-05-12 ENCOUNTER — Ambulatory Visit (INDEPENDENT_AMBULATORY_CARE_PROVIDER_SITE_OTHER): Payer: Medicare Other | Admitting: Nurse Practitioner

## 2011-05-12 VITALS — BP 124/68 | HR 74 | Ht 67.0 in | Wt 268.0 lb

## 2011-05-12 DIAGNOSIS — E669 Obesity, unspecified: Secondary | ICD-10-CM

## 2011-05-12 DIAGNOSIS — I5032 Chronic diastolic (congestive) heart failure: Secondary | ICD-10-CM

## 2011-05-12 DIAGNOSIS — R0602 Shortness of breath: Secondary | ICD-10-CM

## 2011-05-12 DIAGNOSIS — I509 Heart failure, unspecified: Secondary | ICD-10-CM

## 2011-05-12 DIAGNOSIS — I2510 Atherosclerotic heart disease of native coronary artery without angina pectoris: Secondary | ICD-10-CM

## 2011-05-12 LAB — BASIC METABOLIC PANEL
BUN: 23 mg/dL (ref 6–23)
CO2: 26 mEq/L (ref 19–32)
Calcium: 9.1 mg/dL (ref 8.4–10.5)
Chloride: 104 mEq/L (ref 96–112)
Creatinine, Ser: 1.3 mg/dL (ref 0.4–1.5)
GFR: 54.93 mL/min — ABNORMAL LOW (ref >60.00)
Glucose, Bld: 117 mg/dL — ABNORMAL HIGH (ref 70–99)
Potassium: 3.9 mEq/L (ref 3.5–5.1)
Sodium: 140 mEq/L (ref 135–145)

## 2011-05-12 LAB — BRAIN NATRIURETIC PEPTIDE: Pro B Natriuretic peptide (BNP): 148 pg/mL — ABNORMAL HIGH (ref 0.0–100.0)

## 2011-05-12 NOTE — Assessment & Plan Note (Signed)
Has had remote MI. Remains on aspirin and beta blocker. Risk factor modification is the crux of his issues.

## 2011-05-12 NOTE — Progress Notes (Signed)
Harold Jennings Date of Birth: 1932/04/12 Medical Record #[id number]  History of Present Illness: Harold Jennings is seen back today for a one month check. He is seen for Dr. Excell Seltzer. He has a mild ischemic CM with remote MI. He is morbidly obese. Quite limited due to spinal stenosis and chronic pain. Was seen last month with worsening dyspnea. Lasix was started. BP was up a little. Echo was essentially unremarkable. He saw Dr. Excell Seltzer last month and had his ACE increased. HCTZ was stopped. They had a lengthy conversation regarding the need for him to be taking better care of himself and to try and work on his weight.  He comes in today. He is here with his wife. He is feeling ok. Not as short of breath. Unfortunately, hasn't gotten his recumbent bike. Actually has a membership to [employer], just doesn't go. No chest pain. He is limited by his back. He is trying to cut back on his alcohol. Still likes his sweets.   Current Outpatient Prescriptions on File Prior to Visit  Medication Sig Dispense Refill  . aspirin 81 MG tablet Take 81 mg by mouth daily.      Marland Kitchen atenolol (TENORMIN) 25 MG tablet Take 25 mg by mouth daily.      Marland Kitchen doxazosin (CARDURA) 8 MG tablet Take 8 mg by mouth at bedtime.      Marland Kitchen ezetimibe (ZETIA) 10 MG tablet Take 10 mg by mouth daily.      . furosemide (LASIX) 40 MG tablet Take 1 tablet (40 mg total) by mouth daily.  30 tablet  11  . gabapentin (NEURONTIN) 600 MG tablet Take 300 mg by mouth 2 (two) times daily.      Marland Kitchen levothyroxine (SYNTHROID, LEVOTHROID) 75 MCG tablet Take 75 mcg by mouth daily.      Marland Kitchen lisinopril (PRINIVIL,ZESTRIL) 20 MG tablet Take 1 tablet (20 mg total) by mouth daily.  90 tablet  3  . Multiple Vitamin (MULTIVITAMIN) tablet Take 1 tablet by mouth daily.      Marland Kitchen omeprazole-sodium bicarbonate (ZEGERID) 40-1100 MG per capsule Take 1 capsule by mouth daily before breakfast.      . DISCONTD: hydrochlorothiazide 25 MG tablet Take 1 tablet (25 mg total) by mouth daily.  30  tablet  2    No Known Allergies  Past Medical History  Diagnosis Date  . Dyslipidemia   . HTN (hypertension)   . Obesity   . CAD (coronary artery disease)     Silent MI sometime prior to 2000. S/P cath in 2000 showing a total mid RCA with collaterals from the LCX. EF is 40 to50%; Last Myoview in 2010 showing inferior scar without ischemia. EF was 52%.  . Increased liver enzymes     felt to be a fatty liver per ultrasound in 2011    Past Surgical History  Procedure Date  . Total hip arthroplasty   . Eye surgery   . Lower back surgery     spinal stenosis  . Shoulder surgery     History  Smoking status  . Never Smoker   Smokeless tobacco  . Not on file    History  Alcohol Use  . Yes    2-4 glasses of wine daily    Family History  Problem Relation Age of Onset  . Stroke Father   . Heart attack Father   . Hypertension Father   . Diabetes Father   . Drug abuse Mother   . Hypertension Brother   .  Hypertension Brother   . Hypertension Brother   . Hypertension Sister     Review of Systems: The review of systems is per the HPI.  All other systems were reviewed and are negative.  Physical Exam: BP 124/68  Pulse 74  Ht 5\' 7"  (1.702 m)  Wt 268 lb (121.564 kg)  BMI 41.97 kg/m2 Patient is very pleasant and in no acute distress. He is obese. Face is ruddy. Skin is warm and dry. Color is normal.  HEENT is unremarkable. Normocephalic/atraumatic. PERRL. Sclera are nonicteric. Neck is supple. No masses. No JVD. Lungs are clear. Cardiac exam shows a regular rate and rhythm. Abdomen is obese but soft. Extremities are without edema. Gait and ROM are intact. No gross neurologic deficits noted.  LABORATORY DATA: PENDING   Echo Study Conclusions  - Left ventricle: The cavity size was normal. There was mild focal basal and mild concentric hypertrophy of the septum. Systolic function was normal. The estimated ejection fraction was in the range of 55% to 60%. Wall motion  was normal; there were no regional wall motion abnormalities. Doppler parameters are consistent with abnormal left ventricular relaxation (grade 1 diastolic dysfunction). - Aortic valve: Trivial regurgitation. - Mitral valve: Mild prolapse, involving the posterior leaflet. Mild regurgitation. - Left atrium: The atrium was mildly dilated.     Assessment / Plan:

## 2011-05-12 NOTE — Patient Instructions (Signed)
We will recheck your labs today.  Think about what we talked about today. Exercise is strongly encouraged  We will see you back in 3 months.  Call the [facility] office at [phone] if you have any questions, problems or concerns.

## 2011-05-12 NOTE — Assessment & Plan Note (Signed)
We are rechecking his labs today. His symptoms are felt to be more related to his obesity. He is not yet committed to making his health a priority and we discussed this at length today. I think he could greatly benefit from either a water program or using the bike. His wife is quite supportive. I have left him on his current medicines. Tried to be encouraging and he seems receptive. We will see him back in 3 months to assess his progress. Patient is agreeable to this plan and will call if any problems develop in the interim.

## 2011-05-12 NOTE — Assessment & Plan Note (Signed)
This has been discussed in great detail and at great length today.

## 2011-05-17 ENCOUNTER — Telehealth: Payer: Self-pay | Admitting: Cardiovascular Disease

## 2011-05-17 NOTE — Telephone Encounter (Signed)
Pt notified of lab results

## 2011-05-17 NOTE — Telephone Encounter (Signed)
New Problem:     Patient returned Lauren's call regarding his blood work.  Please call back.

## 2011-08-18 ENCOUNTER — Encounter: Payer: Self-pay | Admitting: Cardiovascular Disease

## 2011-08-18 ENCOUNTER — Ambulatory Visit (INDEPENDENT_AMBULATORY_CARE_PROVIDER_SITE_OTHER): Payer: Medicare Other | Admitting: Cardiovascular Disease

## 2011-08-18 VITALS — BP 133/80 | HR 56 | Resp 18 | Ht 67.0 in | Wt 264.8 lb

## 2011-08-18 DIAGNOSIS — I10 Essential (primary) hypertension: Secondary | ICD-10-CM

## 2011-08-18 DIAGNOSIS — I2510 Atherosclerotic heart disease of native coronary artery without angina pectoris: Secondary | ICD-10-CM

## 2011-08-18 DIAGNOSIS — I5032 Chronic diastolic (congestive) heart failure: Secondary | ICD-10-CM

## 2011-08-18 NOTE — Assessment & Plan Note (Signed)
No ischemic symptoms. Patient will continue on his current medical program which includes aspirin and a beta blocker. He is unable to tolerate statin drugs and he continues on Zetia for lipid-lowering. Lifestyle modification reviewed with him extensively.

## 2011-08-18 NOTE — Assessment & Plan Note (Signed)
Controlled on combination of atenolol, furosemide, and lisinopril. Salt restriction and weight loss strategies reviewed in depth.

## 2011-08-18 NOTE — Assessment & Plan Note (Signed)
The patient is stable on his current medical program. He will continue the same.

## 2011-08-18 NOTE — Progress Notes (Signed)
HPI:  78 year old male presenting for followup evaluation. He's had a remote myocardial infarction and has been followed for an ischemic cardiomyopathy. He presented a few months ago by Norma Fredrickson with symptoms of heart failure and his medications were adjusted. He was started on daily furosemide and he had a nice response to this. He still has some shortness of breath with exertion but his symptoms are much better. His leg swelling has also improved. He continues to struggle with morbid obesity. He says he's been trying hard to change his diet and reduce alcohol intake. He started doing some water exercises and using a recumbent bike but he likes to refer to as a "repugnant bike." He has no chest pain or pressure. He denies orthopnea or PND.  Outpatient Encounter Prescriptions as of 08/18/2011  Medication Sig Dispense Refill  . aspirin 81 MG tablet Take 81 mg by mouth daily.      Marland Kitchen atenolol (TENORMIN) 25 MG tablet Take 25 mg by mouth daily.      Marland Kitchen doxazosin (CARDURA) 8 MG tablet Take 8 mg by mouth at bedtime.      Marland Kitchen ezetimibe (ZETIA) 10 MG tablet Take 10 mg by mouth daily.      . furosemide (LASIX) 40 MG tablet Take 1 tablet (40 mg total) by mouth daily.  30 tablet  11  . gabapentin (NEURONTIN) 600 MG tablet Take 300 mg by mouth 2 (two) times daily.      Marland Kitchen levothyroxine (SYNTHROID, LEVOTHROID) 75 MCG tablet Take 75 mcg by mouth daily.      Marland Kitchen lisinopril (PRINIVIL,ZESTRIL) 20 MG tablet Take 1 tablet (20 mg total) by mouth daily.  90 tablet  3  . Multiple Vitamin (MULTIVITAMIN) tablet Take 1 tablet by mouth daily.      Marland Kitchen omeprazole-sodium bicarbonate (ZEGERID) 40-1100 MG per capsule Take 1 capsule by mouth daily before breakfast.        No Known Allergies  Past Medical History  Diagnosis Date  . Dyslipidemia   . HTN (hypertension)   . Obesity   . CAD (coronary artery disease)     Silent MI sometime prior to 2000. S/P cath in 2000 showing a total mid RCA with collaterals from the LCX. EF is  40 to50%; Last Myoview in 2010 showing inferior scar without ischemia. EF was 52%.  . Increased liver enzymes     felt to be a fatty liver per ultrasound in 2011  . Dyspnea   . Ischemic cardiomyopathy   . Spinal stenosis    BP 133/80  Pulse 56  Resp 18  Ht 5\' 7"  (1.702 m)  Wt 264 lb 12.8 oz (120.112 kg)  BMI 41.47 kg/m2  SpO2 92%  PHYSICAL EXAM: Pt is alert and oriented, obese male in NAD HEENT: normal Neck: JVP - normal, carotids 2+= without bruits Lungs: CTA bilaterally CV: RRR without murmur or gallop Abd: soft, NT, Positive BS Ext: Trace pretibial edema bilaterally, distal pulses intact and equal Skin: warm/dry no rash  EKG:  Sinus bradycardia with first degree AV block, 56 beats per minute. Right bundle branch block. Unchanged from baseline tracings.  2D ECHO 04/11/11: Study Conclusions  - Left ventricle: The cavity size was normal. There was mild focal basal and mild concentric hypertrophy of the septum. Systolic function was normal. The estimated ejection fraction was in the range of 55% to 60%. Wall motion was normal; there were no regional wall motion abnormalities. Doppler parameters are consistent with abnormal left ventricular relaxation (  grade 1 diastolic dysfunction). - Aortic valve: Trivial regurgitation. - Mitral valve: Mild prolapse, involving the posterior leaflet. Mild regurgitation. - Left atrium: The atrium was mildly dilated.  ASSESSMENT AND PLAN:

## 2011-08-18 NOTE — Patient Instructions (Addendum)
Your physician wants you to follow-up in:  6 months. You will receive a reminder letter in the mail two months in advance. If you don't receive a letter, please call our office to schedule the follow-up appointment.   

## 2011-10-06 ENCOUNTER — Telehealth: Payer: Self-pay

## 2011-10-06 ENCOUNTER — Other Ambulatory Visit: Payer: Self-pay | Admitting: Cardiovascular Disease

## 2011-10-06 NOTE — Telephone Encounter (Signed)
Confirm refill verification To [facility]  increased from Lisinopril 10 mg to Lisinopril 20 mg daily per Lawson Fiscal Gerhardt,NP LOV notes.

## 2012-04-02 ENCOUNTER — Ambulatory Visit: Payer: Medicare Other | Admitting: Cardiovascular Disease

## 2012-04-29 ENCOUNTER — Encounter: Payer: Self-pay | Admitting: Cardiovascular Disease

## 2012-04-29 ENCOUNTER — Ambulatory Visit (INDEPENDENT_AMBULATORY_CARE_PROVIDER_SITE_OTHER): Payer: Medicare Other | Admitting: Cardiovascular Disease

## 2012-04-29 VITALS — BP 139/80 | HR 80 | Ht 67.0 in | Wt 269.0 lb

## 2012-04-29 DIAGNOSIS — I2510 Atherosclerotic heart disease of native coronary artery without angina pectoris: Secondary | ICD-10-CM

## 2012-04-29 DIAGNOSIS — I4891 Unspecified atrial fibrillation: Secondary | ICD-10-CM

## 2012-04-29 MED ORDER — FUROSEMIDE 40 MG PO TABS: 40.0000 mg | ORAL_TABLET | Freq: Every day | ORAL | Status: DC

## 2012-04-29 MED ORDER — RIVAROXABAN 20 MG PO TABS: 20.0000 mg | ORAL_TABLET | Freq: Every day | ORAL | Status: DC

## 2012-04-29 MED ORDER — ATENOLOL 50 MG PO TABS: 50.0000 mg | ORAL_TABLET | Freq: Every day | ORAL | Status: DC

## 2012-04-29 NOTE — Progress Notes (Signed)
HPI:  79 year-old male presenting for follow-up evaluation. He is followed for CAD, HTN, diastolic heart failure, and obesity. He was last seen in August 2013. He has not been feeling well. He complains of shortness of breath, leg swelling, and tiredness. He is unable to do some ADL's because of dyspnea. He complains of orthopnea but denies PND. He has not been watching his diet. Drinks a few glasses of wine each night. Denies chest pain or pressure. He is dissatisfied with his current symptoms, unhappy that he cannot do even low level activities without dyspnea. He denies cough, fever, chills.   Outpatient Encounter Prescriptions as of 04/29/2012  Medication Sig Dispense Refill  . aspirin 81 MG tablet Take 81 mg by mouth daily.      Marland Kitchen atenolol (TENORMIN) 25 MG tablet Take 25 mg by mouth daily.      . clobetasol cream (TEMOVATE) 0.05 % As directed      . doxazosin (CARDURA) 8 MG tablet Take 8 mg by mouth at bedtime.      Marland Kitchen ezetimibe (ZETIA) 10 MG tablet Take 10 mg by mouth daily.      . furosemide (LASIX) 40 MG tablet Prn      . gabapentin (NEURONTIN) 600 MG tablet Take 1/2 tab in the am ans 1/2 - to 1 whole tab in the evening      . levothyroxine (SYNTHROID, LEVOTHROID) 88 MCG tablet Take 88 mcg by mouth daily before breakfast.      . lisinopril (PRINIVIL,ZESTRIL) 20 MG tablet Take 1 tablet (20 mg total) by mouth daily.  90 tablet  3  . Multiple Vitamin (MULTIVITAMIN) tablet Take 1 tablet by mouth daily.      Marland Kitchen omeprazole (PRILOSEC) 20 MG capsule 1 tab daily      . [DISCONTINUED] furosemide (LASIX) 20 MG tablet As needed      . [DISCONTINUED] levothyroxine (SYNTHROID, LEVOTHROID) 75 MCG tablet Take 75 mcg by mouth daily.      . [DISCONTINUED] lisinopril (PRINIVIL,ZESTRIL) 10 MG tablet TAKE 1 TABLET EVERY DAY  30 tablet  6  . [DISCONTINUED] omeprazole-sodium bicarbonate (ZEGERID) 40-1100 MG per capsule Take 1 capsule by mouth daily before breakfast.       No facility-administered encounter  medications on file as of 04/29/2012.    No Known Allergies  Past Medical History  Diagnosis Date  . Dyslipidemia   . HTN (hypertension)   . Obesity   . CAD (coronary artery disease)     Silent MI sometime prior to 2000. S/P cath in 2000 showing a total mid RCA with collaterals from the LCX. EF is 40 to50%; Last Myoview in 2010 showing inferior scar without ischemia. EF was 52%.  . Increased liver enzymes     felt to be a fatty liver per ultrasound in 2011  . Dyspnea   . Ischemic cardiomyopathy   . Spinal stenosis     ROS: Negative except as per HPI  BP 139/80  Pulse 80  Ht 5\' 7"  (1.702 m)  Wt 122.018 kg (269 lb)  BMI 42.12 kg/m2  PHYSICAL EXAM: Pt is alert and oriented, obese, pleasant elderly male in NAD HEENT: normal Neck: JVP - normal, carotids 2+= without bruits Lungs: CTA bilaterally CV: irregularly irregular without murmur or gallop Abd: soft, NT, Positive BS, no hepatomegaly, obese Ext: trace edema bilaterally, distal pulses intact and equal Skin: warm/dry no rash  EKG:  Atrial fibrillation 78 beats per minute, right bundle branch block, otherwise within normal  limits.  2-D echo: Left ventricle: The cavity size was normal. There was mild focal basal and mild concentric hypertrophy of the septum. Systolic function was normal. The estimated ejection fraction was in the range of 55% to 60%. Wall motion was normal; there were no regional wall motion abnormalities. Doppler parameters are consistent with abnormal left ventricular relaxation (grade 1 diastolic dysfunction).  ------------------------------------------------------------ Aortic valve: Trileaflet; mildly calcified leaflets. Mobility was not restricted. Doppler: Transvalvular velocity was within the normal range. There was no stenosis. Trivial regurgitation.  ------------------------------------------------------------ Aorta: Aortic root: The aortic root was normal in  size.  ------------------------------------------------------------ Mitral valve: Mobility was not restricted. Mild prolapse, involving the posterior leaflet. Doppler: Transvalvular velocity was within the normal range. There was no evidence for stenosis. Mild regurgitation.  ------------------------------------------------------------ Left atrium: The atrium was mildly dilated.  ------------------------------------------------------------ Right ventricle: The cavity size was normal. Systolic function was normal.  ------------------------------------------------------------ Pulmonic valve: Doppler: Transvalvular velocity was within the normal range. There was no evidence for stenosis. Mild regurgitation.  ------------------------------------------------------------ Tricuspid valve: Structurally normal valve. Doppler: Transvalvular velocity was within the normal range. Trivial regurgitation.  ------------------------------------------------------------ Pulmonary artery: Systolic pressure was within the normal range.  ------------------------------------------------------------ Right atrium: The atrium was normal in size.  ------------------------------------------------------------ Pericardium: There was no pericardial effusion.  ------------------------------------------------------------ Systemic veins: Inferior vena cava: The vessel was normal in size.  ASSESSMENT AND PLAN: 1. New atrial fibrillation. We have reviewed the clinical implications of atrial fibrillation, and issues related to etiology and treatment options. We have discussed the indication for anticoagulation. His CHADS-Vasc score is 5. We reviewed pros and cons of warfarin versus a novel anticoagulant drug and he has opted for a novel agent. Will prescrib Xarelto 20 mg daily. He will have labs today to confirm renal function and hemoglobin appropriate to start this drug. Atenolol will be increased to 50 mg for  better rate-control. I will see him back in 2-3 weeks for follow-up and likely set him up for DCCV after 3 weeks of anticoagulation.  2. Acute on chronic diastolic CHF. This is likely precipitated by AF. Will increase furosemide to 40 mg daily, continue other meds without change.   3. Obesity. Lengthy discussion about need for weight reduction. He understands but has many reasons why he doesn't think he'll be successful.  4. HTN - controlled. Increase atenolol and furosemide. Continue lisinopril.  Tonny Bollman 05/01/2012 4:02 PM

## 2012-04-29 NOTE — Patient Instructions (Addendum)
Your physician recommends that you have lab work today: CBC, BMP and LIVER  Your physician has recommended you make the following change in your medication: START Xarelto 20mg  take one by mouth with evening meal, INCREASE Furosemide to 40mg  take one by mouth daily, INCREASE Atenolol to 50mg  take one by mouth daily  Your physician recommends that you schedule a follow-up appointment in: 2-3 WEEKS with Dr Excell Seltzer   After the pt's office visit he made [place] aware that he needs to have a molar extracted on 05/08/12.  Per Dr Excell Seltzer the pt should continue ASA through extraction.  On 05/09/12 the pt should start Xarelto 20mg  daily and STOP Aspirin.  I spoke with the pt by phone and he verbalized understanding of these instructions. 5:58 PM Julieta Gutting RN

## 2012-04-30 ENCOUNTER — Encounter: Payer: Self-pay | Admitting: Cardiovascular Disease

## 2012-04-30 LAB — CBC WITH DIFFERENTIAL/PLATELET
Eosinophils Relative: 3.7 % (ref 0.0–5.0)
HCT: 43.5 % (ref 39.0–52.0)
Hemoglobin: 15 g/dL (ref 13.0–17.0)
Lymphs Abs: 1.9 10*3/uL (ref 0.7–4.0)
MCV: 98.8 fl (ref 78.0–100.0)
Monocytes Relative: 9.2 % (ref 3.0–12.0)
Neutro Abs: 4 10*3/uL (ref 1.4–7.7)
RDW: 14.6 % (ref 11.5–14.6)
WBC: 6.8 10*3/uL (ref 4.5–10.5)

## 2012-04-30 LAB — HEPATIC FUNCTION PANEL
AST: 56 U/L — ABNORMAL HIGH (ref 0–37)
Alkaline Phosphatase: 68 U/L (ref 39–117)
Bilirubin, Direct: 0.3 mg/dL (ref 0.0–0.3)
Total Bilirubin: 1.6 mg/dL — ABNORMAL HIGH (ref 0.3–1.2)

## 2012-04-30 LAB — BASIC METABOLIC PANEL
GFR: 50.83 mL/min — ABNORMAL LOW (ref >60.00)
Potassium: 4.1 mEq/L (ref 3.5–5.1)
Sodium: 137 mEq/L (ref 135–145)

## 2012-04-30 NOTE — Telephone Encounter (Signed)
This encounter was created in error - please disregard.

## 2012-04-30 NOTE — Telephone Encounter (Signed)
New problem   Pt would like for you to call him concerning his visit from yesterday. Please call pt

## 2012-05-01 ENCOUNTER — Encounter: Payer: Self-pay | Admitting: Cardiovascular Disease

## 2012-05-13 ENCOUNTER — Other Ambulatory Visit: Payer: Self-pay | Admitting: Cardiovascular Disease

## 2012-05-13 NOTE — Telephone Encounter (Signed)
Fax Received. Refill Completed. Harold Jennings (R.M.A)   

## 2012-05-28 ENCOUNTER — Ambulatory Visit (INDEPENDENT_AMBULATORY_CARE_PROVIDER_SITE_OTHER): Payer: Medicare Other | Admitting: Cardiovascular Disease

## 2012-05-28 ENCOUNTER — Encounter: Payer: Self-pay | Admitting: Cardiovascular Disease

## 2012-05-28 VITALS — BP 124/86 | HR 87 | Ht 68.0 in | Wt 262.0 lb

## 2012-05-28 DIAGNOSIS — I4891 Unspecified atrial fibrillation: Secondary | ICD-10-CM

## 2012-05-28 DIAGNOSIS — I2510 Atherosclerotic heart disease of native coronary artery without angina pectoris: Secondary | ICD-10-CM

## 2012-05-28 NOTE — Progress Notes (Signed)
HPI:   79 year old gentleman presenting for followup evaluation. The patient has coronary artery disease, hypertension, diastolic heart failure, atrial fibrillation, and obesity. I saw him last about one month ago when he was diagnosed with new onset atrial fibrillation. At that time he complained of worsening dyspnea with minimal exertion. At that time he was started on anticoagulation, atenolol was increased for better rate control, and his furosemide was also increased because of volume overload. His LV function has been well preserved, despite his silent inferior wall MI in the past.  He continues to feel poorly. He complains of decreased energy. His appetite has not been good. He's lost 7 pounds since his last office visit one month ago. He continues to have shortness of breath with exertion. He denies chest pain or pressure. He has mild leg swelling which is not significantly changed. He's been compliant with his medications. He denies any bleeding problems since starting Xarelto.  Outpatient Encounter Prescriptions as of 05/28/2012  Medication Sig Dispense Refill  . aspirin 81 MG tablet Take 81 mg by mouth daily.      Marland Kitchen atenolol (TENORMIN) 50 MG tablet Take 1 tablet (50 mg total) by mouth daily.  30 tablet  6  . clobetasol cream (TEMOVATE) 0.05 % As directed      . doxazosin (CARDURA) 8 MG tablet Take 8 mg by mouth at bedtime.      Marland Kitchen ezetimibe (ZETIA) 10 MG tablet Take 10 mg by mouth daily.      Marland Kitchen gabapentin (NEURONTIN) 600 MG tablet Take 1/2 tab in the am ans 1/2 - to 1 whole tab in the evening      . levothyroxine (SYNTHROID, LEVOTHROID) 88 MCG tablet Take 88 mcg by mouth daily before breakfast.      . lisinopril (PRINIVIL,ZESTRIL) 20 MG tablet TAKE 1 TABLET DAILY  90 tablet  2  . Multiple Vitamin (MULTIVITAMIN) tablet Take 1 tablet by mouth daily.      Marland Kitchen omeprazole (PRILOSEC) 20 MG capsule 1 tab daily      . Rivaroxaban (XARELTO) 20 MG TABS Take 1 tablet (20 mg total) by mouth daily  with supper.  30 tablet  6  . furosemide (LASIX) 40 MG tablet Take 1 tablet (40 mg total) by mouth daily.  30 tablet  6   No facility-administered encounter medications on file as of 05/28/2012.    No Known Allergies  Past Medical History  Diagnosis Date  . Dyslipidemia   . HTN (hypertension)   . Obesity   . CAD (coronary artery disease)     Silent MI sometime prior to 2000. S/P cath in 2000 showing a total mid RCA with collaterals from the LCX. EF is 40 to50%; Last Myoview in 2010 showing inferior scar without ischemia. EF was 52%.  . Increased liver enzymes     felt to be a fatty liver per ultrasound in 2011  . Dyspnea   . Ischemic cardiomyopathy   . Spinal stenosis     ROS: Negative except as per HPI  BP 124/86  Pulse 87  Ht 5\' 8"  (1.727 m)  Wt 118.842 kg (262 lb)  BMI 39.85 kg/m2  SpO2 98%  PHYSICAL EXAM: Pt is alert and oriented, obese male in NAD HEENT: normal Neck: JVP - normal, carotids 2+= without bruits Lungs: CTA bilaterally CV: irregularly irregular without murmur or gallop Abd: soft, NT, Positive BS, no hepatomegaly Ext: trace edema bilaterally, distal pulses intact and equal Skin: warm/dry no rash  EKG:  Atrial fibrillation 87 beats per minute, right bundle branch block, age indeterminate inferior MI.  ASSESSMENT AND PLAN: 1. Atrial fibrillation complicated by New York Heart Association class III heart failure. Antiarrhythmic options are a bit limited because of his underlying coronary disease. He really does not want to take another medication and certainly not one with significant potential side effects. He understands that his heart failure will likely be more difficult to treat if he remains in atrial fibrillation. I think it would be reasonable to try electrical cardioversion as a first step. I have reviewed the risks, indications, and alternatives of this approach. The patient agrees to elective cardioversion. He will have been anticoagulated for at  least 3 weeks by the time the procedure will be scheduled. He is compliant with his medications and we confirmed that he is taking Xarelto every night with dinner. He will continue on atenolol for heart rate control.  2. Coronary artery disease with history of silent inferior wall MI. Continue medical therapy. No anginal symptoms.  3. Chronic diastolic heart failure. No significant change today. He notes that he urinates well after taking furosemide. We'll continue his same program which includes an ACE inhibitor, beta blocker, and Lasix.  4. Morbid obesity. He's really been in denial about his weight. We had a lengthy discussion today. He understands that this is certainly one of his primary problems. His atrial fibrillation will be difficult to treat in the setting of his obesity. He is compliant with CPAP. He's lost a few pounds since his last visit and he will continue to work on this. We reviewed specific dietary changes that can be made.  Harold Jennings 05/28/2012 6:02 PM

## 2012-05-28 NOTE — Patient Instructions (Addendum)
Your physician has recommended that you have a Cardioversion (DCCV). Electrical Cardioversion uses a jolt of electricity to your heart either through paddles or wired patches attached to your chest. This is a controlled, usually prescheduled, procedure. Defibrillation is done under light anesthesia in the hospital, and you usually go home the day of the procedure. This is done to get your heart back into a normal rhythm. You are not awake for the procedure. Please see the instruction sheet given to you today.  Your physician recommends that you continue on your current medications as directed. Please refer to the Current Medication list given to you today. 

## 2012-06-04 ENCOUNTER — Other Ambulatory Visit (INDEPENDENT_AMBULATORY_CARE_PROVIDER_SITE_OTHER): Payer: Medicare Other

## 2012-06-04 ENCOUNTER — Other Ambulatory Visit: Payer: Self-pay | Admitting: Cardiovascular Disease

## 2012-06-04 DIAGNOSIS — I2510 Atherosclerotic heart disease of native coronary artery without angina pectoris: Secondary | ICD-10-CM

## 2012-06-04 DIAGNOSIS — I4891 Unspecified atrial fibrillation: Secondary | ICD-10-CM

## 2012-06-04 LAB — BASIC METABOLIC PANEL
BUN: 18 mg/dL (ref 6–23)
CO2: 27 mEq/L (ref 19–32)
Calcium: 9.2 mg/dL (ref 8.4–10.5)
Creatinine, Ser: 1.5 mg/dL (ref 0.4–1.5)
Glucose, Bld: 105 mg/dL — ABNORMAL HIGH (ref 70–99)

## 2012-06-04 LAB — CBC WITH DIFFERENTIAL/PLATELET
Basophils Absolute: 0 10*3/uL (ref 0.0–0.1)
Basophils Relative: 0.3 % (ref 0.0–3.0)
Eosinophils Absolute: 0.3 10*3/uL (ref 0.0–0.7)
HCT: 46.4 % (ref 39.0–52.0)
Hemoglobin: 15.9 g/dL (ref 13.0–17.0)
Lymphs Abs: 2.1 10*3/uL (ref 0.7–4.0)
MCHC: 34.4 g/dL (ref 30.0–36.0)
MCV: 99.3 fl (ref 78.0–100.0)
Monocytes Absolute: 0.7 10*3/uL (ref 0.1–1.0)
Neutro Abs: 5.5 10*3/uL (ref 1.4–7.7)
RBC: 4.67 Mil/uL (ref 4.22–5.81)
RDW: 14.7 % — ABNORMAL HIGH (ref 11.5–14.6)

## 2012-06-06 ENCOUNTER — Encounter (HOSPITAL_COMMUNITY): Payer: Self-pay | Admitting: *Deleted

## 2012-06-06 ENCOUNTER — Ambulatory Visit (HOSPITAL_COMMUNITY): Payer: Medicare Other | Admitting: Anesthesiology

## 2012-06-06 ENCOUNTER — Encounter (HOSPITAL_COMMUNITY): Payer: Self-pay | Admitting: Anesthesiology

## 2012-06-06 ENCOUNTER — Encounter (HOSPITAL_COMMUNITY)
Admission: RE | Disposition: A | Discharge status: Home / Self Care | Payer: Self-pay | Source: Ambulatory Visit | Attending: Cardiovascular Disease

## 2012-06-06 ENCOUNTER — Ambulatory Visit (HOSPITAL_COMMUNITY)
Admission: RE | Admit: 2012-06-06 | Discharge: 2012-06-06 | Disposition: A | Discharge status: Home / Self Care | Payer: Medicare Other | Source: Ambulatory Visit | Attending: Cardiovascular Disease | Admitting: Cardiovascular Disease

## 2012-06-06 DIAGNOSIS — I2589 Other forms of chronic ischemic heart disease: Secondary | ICD-10-CM | POA: Insufficient documentation

## 2012-06-06 DIAGNOSIS — Z7982 Long term (current) use of aspirin: Secondary | ICD-10-CM | POA: Insufficient documentation

## 2012-06-06 DIAGNOSIS — Z79899 Other long term (current) drug therapy: Secondary | ICD-10-CM | POA: Insufficient documentation

## 2012-06-06 DIAGNOSIS — I509 Heart failure, unspecified: Secondary | ICD-10-CM | POA: Insufficient documentation

## 2012-06-06 DIAGNOSIS — I4891 Unspecified atrial fibrillation: Secondary | ICD-10-CM

## 2012-06-06 DIAGNOSIS — I252 Old myocardial infarction: Secondary | ICD-10-CM | POA: Insufficient documentation

## 2012-06-06 DIAGNOSIS — M4800 Spinal stenosis, site unspecified: Secondary | ICD-10-CM | POA: Insufficient documentation

## 2012-06-06 DIAGNOSIS — Z7901 Long term (current) use of anticoagulants: Secondary | ICD-10-CM | POA: Insufficient documentation

## 2012-06-06 DIAGNOSIS — I4510 Unspecified right bundle-branch block: Secondary | ICD-10-CM | POA: Insufficient documentation

## 2012-06-06 DIAGNOSIS — K219 Gastro-esophageal reflux disease without esophagitis: Secondary | ICD-10-CM | POA: Insufficient documentation

## 2012-06-06 DIAGNOSIS — E669 Obesity, unspecified: Secondary | ICD-10-CM | POA: Insufficient documentation

## 2012-06-06 DIAGNOSIS — Z6839 Body mass index (BMI) 39.0-39.9, adult: Secondary | ICD-10-CM | POA: Insufficient documentation

## 2012-06-06 DIAGNOSIS — I5032 Chronic diastolic (congestive) heart failure: Secondary | ICD-10-CM | POA: Insufficient documentation

## 2012-06-06 DIAGNOSIS — I2510 Atherosclerotic heart disease of native coronary artery without angina pectoris: Secondary | ICD-10-CM | POA: Insufficient documentation

## 2012-06-06 DIAGNOSIS — E785 Hyperlipidemia, unspecified: Secondary | ICD-10-CM | POA: Insufficient documentation

## 2012-06-06 DIAGNOSIS — I10 Essential (primary) hypertension: Secondary | ICD-10-CM | POA: Insufficient documentation

## 2012-06-06 HISTORY — PX: CARDIOVERSION: SHX1299

## 2012-06-06 SURGERY — CARDIOVERSION
Anesthesia: Monitor Anesthesia Care | Wound class: Clean

## 2012-06-06 MED ORDER — SODIUM CHLORIDE 0.9 % IV SOLN
INTRAVENOUS | Status: DC | PRN
  Administered 2012-06-06: 11:00:00 via INTRAVENOUS

## 2012-06-06 MED ORDER — PROPOFOL 10 MG/ML IV BOLUS
INTRAVENOUS | Status: DC | PRN
  Administered 2012-06-06: 50 mg via INTRAVENOUS

## 2012-06-06 MED ORDER — LIDOCAINE HCL (CARDIAC) 20 MG/ML IV SOLN
INTRAVENOUS | Status: DC | PRN
  Administered 2012-06-06: 30 mg via INTRAVENOUS

## 2012-06-06 MED ORDER — SODIUM CHLORIDE 0.9 % IV SOLN
INTRAVENOUS | Status: DC
  Administered 2012-06-06: 10:00:00 via INTRAVENOUS

## 2012-06-06 NOTE — Preoperative (Signed)
Beta Blockers   Reason not to administer Beta Blockers:Not Applicable, atenolol taken 06/05/12

## 2012-06-06 NOTE — Transfer of Care (Signed)
Immediate Anesthesia Transfer of Care Note  Patient: Harold Jennings  Procedure(s) Performed: Procedure(s): CARDIOVERSION (N/A)  Patient Location: PACU  Anesthesia Type:General  Level of Consciousness: awake, alert , oriented and patient cooperative  Airway & Oxygen Therapy: Patient Spontanous Breathing and Patient connected to nasal cannula oxygen  Post-op Assessment: Report given to PACU RN, Post -op Vital signs reviewed and stable and Patient moving all extremities  Post vital signs: Reviewed and stable  Complications: No apparent anesthesia complications

## 2012-06-06 NOTE — Anesthesia Preprocedure Evaluation (Signed)
Anesthesia Evaluation  Patient identified by MRN, date of birth, ID band Patient awake    Reviewed: Allergy & Precautions, H&P , NPO status , Patient's Chart, lab work & pertinent test results  Airway Mallampati: II  Neck ROM: Full    Dental   Pulmonary shortness of breath, sleep apnea ,  breath sounds clear to auscultation        Cardiovascular hypertension, + CAD and + DOE + dysrhythmias Atrial Fibrillation Rhythm:Irregular Rate:Normal     Neuro/Psych    GI/Hepatic GERD-  ,  Endo/Other  Morbid obesity  Renal/GU      Musculoskeletal   Abdominal (+) + obese,   Peds  Hematology   Anesthesia Other Findings   Reproductive/Obstetrics                           Anesthesia Physical Anesthesia Plan  ASA: III  Anesthesia Plan: General   Post-op Pain Management:    Induction: Intravenous  Airway Management Planned: Mask  Additional Equipment:   Intra-op Plan:   Post-operative Plan:   Informed Consent: I have reviewed the patients History and Physical, chart, labs and discussed the procedure including the risks, benefits and alternatives for the proposed anesthesia with the patient or authorized representative who has indicated his/her understanding and acceptance.     Plan Discussed with: CRNA and Surgeon  Anesthesia Plan Comments:         Anesthesia Quick Evaluation

## 2012-06-06 NOTE — Interval H&P Note (Signed)
History and Physical Interval Note:  06/06/2012 10:46 AM  Harold Jennings  has presented today for surgery, with the diagnosis of a fib  The various methods of treatment have been discussed with the patient and family. After consideration of risks, benefits and other options for treatment, the patient has consented to  Procedure(s): CARDIOVERSION (N/A) as a surgical intervention .  The patient's history has been reviewed, patient examined, no change in status, stable for surgery.  I have reviewed the patient's chart and labs.  Questions were answered to the patient's satisfaction.     [name].

## 2012-06-06 NOTE — H&P (View-Only) (Signed)
 HPI:   79-year-old gentleman presenting for followup evaluation. The patient has coronary artery disease, hypertension, diastolic heart failure, atrial fibrillation, and obesity. I saw him last about one month ago when he was diagnosed with new onset atrial fibrillation. At that time he complained of worsening dyspnea with minimal exertion. At that time he was started on anticoagulation, atenolol was increased for better rate control, and his furosemide was also increased because of volume overload. His LV function has been well preserved, despite his silent inferior wall MI in the past.  He continues to feel poorly. He complains of decreased energy. His appetite has not been good. He's lost 7 pounds since his last office visit one month ago. He continues to have shortness of breath with exertion. He denies chest pain or pressure. He has mild leg swelling which is not significantly changed. He's been compliant with his medications. He denies any bleeding problems since starting Xarelto.  Outpatient Encounter Prescriptions as of 05/28/2012  Medication Sig Dispense Refill  . aspirin 81 MG tablet Take 81 mg by mouth daily.      . atenolol (TENORMIN) 50 MG tablet Take 1 tablet (50 mg total) by mouth daily.  30 tablet  6  . clobetasol cream (TEMOVATE) 0.05 % As directed      . doxazosin (CARDURA) 8 MG tablet Take 8 mg by mouth at bedtime.      . ezetimibe (ZETIA) 10 MG tablet Take 10 mg by mouth daily.      . gabapentin (NEURONTIN) 600 MG tablet Take 1/2 tab in the am ans 1/2 - to 1 whole tab in the evening      . levothyroxine (SYNTHROID, LEVOTHROID) 88 MCG tablet Take 88 mcg by mouth daily before breakfast.      . lisinopril (PRINIVIL,ZESTRIL) 20 MG tablet TAKE 1 TABLET DAILY  90 tablet  2  . Multiple Vitamin (MULTIVITAMIN) tablet Take 1 tablet by mouth daily.      . omeprazole (PRILOSEC) 20 MG capsule 1 tab daily      . Rivaroxaban (XARELTO) 20 MG TABS Take 1 tablet (20 mg total) by mouth daily  with supper.  30 tablet  6  . furosemide (LASIX) 40 MG tablet Take 1 tablet (40 mg total) by mouth daily.  30 tablet  6   No facility-administered encounter medications on file as of 05/28/2012.    No Known Allergies  Past Medical History  Diagnosis Date  . Dyslipidemia   . HTN (hypertension)   . Obesity   . CAD (coronary artery disease)     Silent MI sometime prior to 2000. S/P cath in 2000 showing a total mid RCA with collaterals from the LCX. EF is 40 to50%; Last Myoview in 2010 showing inferior scar without ischemia. EF was 52%.  . Increased liver enzymes     felt to be a fatty liver per ultrasound in 2011  . Dyspnea   . Ischemic cardiomyopathy   . Spinal stenosis     ROS: Negative except as per HPI  BP 124/86  Pulse 87  Ht 5' 8" (1.727 m)  Wt 118.842 kg (262 lb)  BMI 39.85 kg/m2  SpO2 98%  PHYSICAL EXAM: Pt is alert and oriented, obese male in NAD HEENT: normal Neck: JVP - normal, carotids 2+= without bruits Lungs: CTA bilaterally CV: irregularly irregular without murmur or gallop Abd: soft, NT, Positive BS, no hepatomegaly Ext: trace edema bilaterally, distal pulses intact and equal Skin: warm/dry no rash  EKG:    Atrial fibrillation 87 beats per minute, right bundle branch block, age indeterminate inferior MI.  ASSESSMENT AND PLAN: 1. Atrial fibrillation complicated by New York Heart Association class III heart failure. Antiarrhythmic options are a bit limited because of his underlying coronary disease. He really does not want to take another medication and certainly not one with significant potential side effects. He understands that his heart failure will likely be more difficult to treat if he remains in atrial fibrillation. I think it would be reasonable to try electrical cardioversion as a first step. I have reviewed the risks, indications, and alternatives of this approach. The patient agrees to elective cardioversion. He will have been anticoagulated for at  least 3 weeks by the time the procedure will be scheduled. He is compliant with his medications and we confirmed that he is taking Xarelto every night with dinner. He will continue on atenolol for heart rate control.  2. Coronary artery disease with history of silent inferior wall MI. Continue medical therapy. No anginal symptoms.  3. Chronic diastolic heart failure. No significant change today. He notes that he urinates well after taking furosemide. We'll continue his same program which includes an ACE inhibitor, beta blocker, and Lasix.  4. Morbid obesity. He's really been in denial about his weight. We had a lengthy discussion today. He understands that this is certainly one of his primary problems. His atrial fibrillation will be difficult to treat in the setting of his obesity. He is compliant with CPAP. He's lost a few pounds since his last visit and he will continue to work on this. We reviewed specific dietary changes that can be made.  Teddie Curd 05/28/2012 6:02 PM      

## 2012-06-06 NOTE — Anesthesia Postprocedure Evaluation (Signed)
  Anesthesia Post-op Note  Patient: Harold Jennings  Procedure(s) Performed: Procedure(s): CARDIOVERSION (N/A)  Patient Location: PACU and Endoscopy Unit  Anesthesia Type:General  Level of Consciousness: awake  Airway and Oxygen Therapy: Patient Spontanous Breathing  Post-op Pain: none  Post-op Assessment: Post-op Vital signs reviewed, Patient's Cardiovascular Status Stable, Respiratory Function Stable, Patent Airway, No signs of Nausea or vomiting and Pain level controlled  Post-op Vital Signs: stable  Complications: No apparent anesthesia complications

## 2012-06-06 NOTE — CV Procedure (Signed)
    Cardioversion Note  ARCH METHOT [id number] 01/05/1933  Procedure: DC Cardioversion Indications: atrial fib  Procedure Details Consent: Obtained Time Out: Verified patient identification, verified procedure, site/side was marked, verified correct patient position, special equipment/implants available, Radiology Safety Procedures followed,  medications/allergies/relevent history reviewed, required imaging and test results available.  Performed  The patient has been on adequate anticoagulation.  The patient received IV Lidocaine 30 mg IV and Propofol 50 mg  for sedation.  Synchronous cardioversion was performed at 120, 200, 200  joules.  The cardioversion was successful after the 3rd cardioversion    Complications: No apparent complications Patient did tolerate procedure well.   Vesta Mixer, Montez Hageman., MD, [facility] 06/06/2012, 11:00 AM

## 2012-06-06 NOTE — Anesthesia Procedure Notes (Addendum)
Date/Time: 06/06/2012 10:57 AM Performed by: Angelica Pou Pre-anesthesia Checklist: Patient identified, Emergency Drugs available, Suction available, Patient being monitored and Timeout performed Patient Re-evaluated:Patient Re-evaluated prior to inductionOxygen Delivery Method: Ambu bag Preoxygenation: Pre-oxygenation with 100% oxygen Intubation Type: IV induction Ventilation: Mask ventilation without difficulty and Oral airway inserted - appropriate to patient size Dental Injury: Teeth and Oropharynx as per pre-operative assessment  Comments: General with mask ventilation.

## 2012-06-07 ENCOUNTER — Encounter (HOSPITAL_COMMUNITY): Payer: Self-pay | Admitting: Cardiovascular Disease

## 2012-06-18 ENCOUNTER — Telehealth: Payer: Self-pay | Admitting: Cardiovascular Disease

## 2012-06-18 NOTE — Telephone Encounter (Signed)
I spoke with the pt and scheduled him to see Dr Excell Seltzer on 06/19/12. The pt will be out of town next week.

## 2012-06-18 NOTE — Telephone Encounter (Signed)
New Problem  Pt states he just had a cardioversion and he wants to know what medications he needs to keep taking.

## 2012-06-18 NOTE — Telephone Encounter (Signed)
Spoke with pt and told him he should continue same medications as prescribed by Dr. Excell Seltzer.  Pt does not have follow up appt set up and would like to know when Dr. Excell Seltzer would like to see him back.  Pt states he will be out of town next week.

## 2012-06-18 NOTE — Telephone Encounter (Signed)
Please schedule next available with me. thx

## 2012-06-19 ENCOUNTER — Ambulatory Visit (INDEPENDENT_AMBULATORY_CARE_PROVIDER_SITE_OTHER): Payer: Medicare Other | Admitting: Cardiovascular Disease

## 2012-06-19 ENCOUNTER — Encounter: Payer: Self-pay | Admitting: Cardiovascular Disease

## 2012-06-19 VITALS — BP 120/68 | HR 90 | Ht 68.0 in | Wt 263.1 lb

## 2012-06-19 DIAGNOSIS — I4891 Unspecified atrial fibrillation: Secondary | ICD-10-CM

## 2012-06-19 DIAGNOSIS — I2510 Atherosclerotic heart disease of native coronary artery without angina pectoris: Secondary | ICD-10-CM

## 2012-06-19 MED ORDER — RIVAROXABAN 20 MG PO TABS: 20.0000 mg | ORAL_TABLET | Freq: Every day | ORAL | Status: DC

## 2012-06-19 MED ORDER — METOPROLOL SUCCINATE ER 50 MG PO TB24: 50.0000 mg | ORAL_TABLET | Freq: Every day | ORAL | Status: DC

## 2012-06-19 NOTE — Progress Notes (Signed)
HPI:  79 year old gentleman presenting for followup evaluation. The patient is followed for atrial fibrillation, coronary artery disease, hypertension, and diastolic heart failure. He underwent elective cardioversion after 3 weeks of anticoagulation with Xarelto. Unfortunately he is back in atrial fibrillation today.  He has no new complaints. He complains of generalized fatigue which has been long-standing. He felt better for the one week after his cardioversion. He denies chest pain or pressure, peripheral edema, orthopnea, or PND. He's trying to lose some weight but hasn't had much success.  Outpatient Encounter Prescriptions as of 06/19/2012  Medication Sig Dispense Refill  . aspirin 81 MG tablet Take 81 mg by mouth daily.      Marland Kitchen atenolol (TENORMIN) 50 MG tablet Take 1 tablet (50 mg total) by mouth daily.  30 tablet  6  . clobetasol cream (TEMOVATE) 0.05 % As directed      . doxazosin (CARDURA) 8 MG tablet Take 8 mg by mouth at bedtime.      Marland Kitchen ezetimibe (ZETIA) 10 MG tablet Take 10 mg by mouth daily.      . furosemide (LASIX) 40 MG tablet Take 1 tablet (40 mg total) by mouth daily.  30 tablet  6  . gabapentin (NEURONTIN) 600 MG tablet Take 1/2 tab in the am ans 1/2 - to 1 whole tab in the evening      . levothyroxine (SYNTHROID, LEVOTHROID) 88 MCG tablet Take 88 mcg by mouth daily before breakfast.      . lisinopril (PRINIVIL,ZESTRIL) 20 MG tablet TAKE 1 TABLET DAILY  90 tablet  2  . Multiple Vitamin (MULTIVITAMIN) tablet Take 1 tablet by mouth daily.      Marland Kitchen omeprazole (PRILOSEC) 20 MG capsule 1 tab daily      . Rivaroxaban (XARELTO) 20 MG TABS Take 1 tablet (20 mg total) by mouth daily with supper.  30 tablet  6   No facility-administered encounter medications on file as of 06/19/2012.    No Known Allergies  Past Medical History  Diagnosis Date  . Dyslipidemia   . HTN (hypertension)   . Obesity   . CAD (coronary artery disease)     Silent MI sometime prior to 2000. S/P cath in 2000  showing a total mid RCA with collaterals from the LCX. EF is 40 to50%; Last Myoview in 2010 showing inferior scar without ischemia. EF was 52%.  . Increased liver enzymes     felt to be a fatty liver per ultrasound in 2011  . Dyspnea   . Ischemic cardiomyopathy   . Spinal stenosis    ROS: Negative except as per HPI  BP 120/68  Pulse 90  Ht 5\' 8"  (1.727 m)  Wt 119.35 kg (263 lb 1.9 oz)  BMI 40.02 kg/m2  PHYSICAL EXAM: Pt is alert and oriented, pleasant obese male in NAD HEENT: normal Neck: JVP - normal Lungs: CTA bilaterally CV: Irregularly irregular without murmur or gallop Abd: soft, NT, Positive BS, no hepatomegaly Ext: Trace pretibial edema bilaterally, distal pulses intact and equal Skin: warm/dry no rash  EKG:  Atrial fibrillation 90 beats per minute, right bundle branch block.  ASSESSMENT AND PLAN: 1. Atrial fibrillation. Unfortunately he has failed elective cardioversion. We discussed treatment options including a strategy of rate control versus rhythm control. He is only a candidate for amiodarone or Tikosyn because of underlying diastolic heart failure and coronary artery disease. After discussion of these options, he favors rhythm control. Because of his generalized fatigue which has been exacerbated by  increasing atenolol, I'm going to change his beta blocker. Will change atenolol to metoprolol succinate 50 mg at bedtime. I'm going to see him back in 8 weeks for followup. He understands that his obesity is a major factor in the difficulty of treating his atrial fibrillation. He will work on this. He does have obstructive sleep apnea and wears CPAP every night.  2. Coronary artery disease. No anginal symptoms. Left ventricular function is preserved. Will continue his current program.  3. Diastolic heart failure. Appears stable. Medications were reviewed and will continue the same with the exception of a change from atenolol to metoprolol succinate.  Tonny Bollman 06/19/2012 4:37 PM

## 2012-06-19 NOTE — Patient Instructions (Addendum)
Your physician has recommended you make the following change in your medication: Stop Atenolol, Start Metoprolol Succinate 50 mg at bedtime.  Your physician recommends that you schedule a follow-up appointment in: 8 WEEKS

## 2012-08-19 ENCOUNTER — Telehealth: Payer: Self-pay | Admitting: Cardiovascular Disease

## 2012-08-19 NOTE — Telephone Encounter (Signed)
New Prob      Pt would like to speak to the nurse regarding his recent cancer diagnosis. Please call.

## 2012-08-19 NOTE — Telephone Encounter (Signed)
I called the pt and he wanted to make Dr Excell Seltzer aware that he has been diagnosed with Cancer. 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  The pt is scheduled to begin radiation on right foot and leg including lymph nodes in groin.  The pt's PET scan did not show metastasis. The pt said that they did not want to do surgery and he begins treatment this week (5 days a week for 7 weeks). Dr Despina Hick in [place] is managing the pt.

## 2012-08-20 NOTE — Telephone Encounter (Signed)
thx for notification. I tried to call patient but voice mailbox was full.

## 2012-09-02 ENCOUNTER — Ambulatory Visit: Payer: Medicare Other | Admitting: Cardiovascular Disease

## 2012-10-24 ENCOUNTER — Other Ambulatory Visit: Payer: Self-pay | Admitting: Cardiovascular Disease

## 2012-11-21 ENCOUNTER — Other Ambulatory Visit: Payer: Self-pay | Admitting: Cardiovascular Disease

## 2012-12-27 ENCOUNTER — Other Ambulatory Visit: Payer: Self-pay | Admitting: Cardiovascular Disease

## 2013-01-03 ENCOUNTER — Ambulatory Visit (INDEPENDENT_AMBULATORY_CARE_PROVIDER_SITE_OTHER): Payer: Medicare Other | Admitting: Cardiovascular Disease

## 2013-01-03 ENCOUNTER — Encounter: Payer: Self-pay | Admitting: Cardiovascular Disease

## 2013-01-03 VITALS — BP 118/80 | HR 80 | Ht 67.0 in | Wt 261.0 lb

## 2013-01-03 DIAGNOSIS — I10 Essential (primary) hypertension: Secondary | ICD-10-CM

## 2013-01-03 DIAGNOSIS — I2510 Atherosclerotic heart disease of native coronary artery without angina pectoris: Secondary | ICD-10-CM

## 2013-01-03 DIAGNOSIS — I4891 Unspecified atrial fibrillation: Secondary | ICD-10-CM

## 2013-01-03 NOTE — Progress Notes (Signed)
    HPI:  79 year old gentleman presenting for followup evaluation. The patient is followed for atrial fibrillation, coronary artery disease, hypertension, and diastolic heart failure. He failed cardioversion earlier this year. He has been managed since that time with the strategy of rate control and anticoagulation. He is anticoagulated with Xarelto.  He continues to feel poorly. Complains of lack of energy, difficulty with CPAP machine. He is short of breath with exertion. Denies leg swelling, orthopnea, or PND. No lightheadedness or syncope. No palpitations. Continues to struggle with low back pain.   Outpatient Encounter Prescriptions as of 01/03/2013  Medication Sig  . doxazosin (CARDURA) 8 MG tablet Take 8 mg by mouth at bedtime.  Marland Kitchen ezetimibe (ZETIA) 10 MG tablet Take 10 mg by mouth daily.  . furosemide (LASIX) 40 MG tablet Take 1 tablet (40 mg total) by mouth daily.  Marland Kitchen gabapentin (NEURONTIN) 600 MG tablet Take 1/2 tab in the am ans 1/2 - to 1 whole tab in the evening  . levothyroxine (SYNTHROID, LEVOTHROID) 88 MCG tablet Take 88 mcg by mouth daily before breakfast.  . lisinopril (PRINIVIL,ZESTRIL) 20 MG tablet TAKE 1 TABLET DAILY  . metoprolol succinate (TOPROL-XL) 50 MG 24 hr tablet TAKE 1 TABLET (50 MG TOTAL) BY MOUTH AT BEDTIME. TAKE WITH OR IMMEDIATELY FOLLOWING A MEAL.  . Multiple Vitamin (MULTIVITAMIN) tablet Take 1 tablet by mouth daily.  Marland Kitchen omeprazole (PRILOSEC) 20 MG capsule 1 tab daily  . Rivaroxaban (XARELTO) 20 MG TABS Take 1 tablet (20 mg total) by mouth daily with supper.  Marland Kitchen aspirin 81 MG tablet Take 81 mg by mouth daily.  . clobetasol cream (TEMOVATE) 0.05 % As directed    No Known Allergies  Past Medical History  Diagnosis Date  . Dyslipidemia   . HTN (hypertension)   . Obesity   . CAD (coronary artery disease)     Silent MI sometime prior to 2000. S/P cath in 2000 showing a total mid RCA with collaterals from the LCX. EF is 40 to50%; Last Myoview in 2010 showing  inferior scar without ischemia. EF was 52%.  . Increased liver enzymes     felt to be a fatty liver per ultrasound in 2011  . Dyspnea   . Ischemic cardiomyopathy   . Spinal stenosis     ROS: Negative except as per HPI  BP 118/80  Pulse 80  Ht 5\' 7"  (1.702 m)  Wt 261 lb (118.389 kg)  BMI 40.87 kg/m2  PHYSICAL EXAM: Pt is alert and oriented, obese male in NAD HEENT: normal Neck: JVP - normal, carotids 2+= without bruits Lungs: CTA bilaterally CV: irregularly irregular without murmur or gallop Abd: soft, NT, Positive BS, no hepatomegaly Ext: trace pretibial edema bilaterally, distal pulses intact and equal Skin: warm/dry no rash  EKG:  Atrial fibrillation 80 beats per minute, right bundle branch block, nonspecific ST segment changes.  ASSESSMENT AND PLAN: 1. Atrial fibrillation: failed cardioversion. Have reviewed consideration of AAD therapy and he has declined - opted for rate control strategy. Anticoagulated with Xarelto and tolerating well.  2. CAD, native vessel. No anginal symptoms.   3. Obesity - I think contributing to his symptoms in a significant way. Advised repeat sleep study evaluation as it has been over a decade since his last study.  4. Hyperlipidemia: on Zetia, statin intolerant  5. HTN: BP controlled on furosemide, lisinopril, and metoprolol.  Tonny Bollman 01/05/2013 10:17 PM

## 2013-01-03 NOTE — Patient Instructions (Signed)
Your physician wants you to follow-up in: 6 MONTHS with Dr Excell Seltzer.  You will receive a reminder letter in the mail two months in advance. If you don't receive a letter, please call our office to schedule the follow-up appointment.  Your physician recommends that you continue on your current medications as directed. Please refer to the Current Medication list given to you today.  Please call the office if you would like to schedule a sleep study.  Polysomnography (Sleep Studies) Polysomnography (PSG) is a series of tests used for detecting (diagnosing) obstructive sleep apnea and other sleep disorders. The tests measure how some parts of your body are working while you are sleeping. The tests are extensive and expensive. They are done in a sleep lab or hospital, and vary from center to center. Your caregiver may perform other more simple sleep studies and questionnaires before doing more complete and involved testing. Testing may not be covered by insurance. Some of these tests are:  An EEG (Electroencephalogram). This tests your brain waves and stages of sleep.  An EOG (Electrooculogram). This measures the movements of your eyes. It detects periods of REM (rapid eye movement) sleep, which is your dream sleep.  An EKG (Electrocardiogram). This measures your heart rhythm.  EMG (Electromyography). This is a measurement of how the muscles are working in your upper airway and your legs while sleeping.  An oximetry measurement. It measures how much oxygen (air) you are getting while sleeping.  Breathing efforts may be measured. The same test can be interpreted (understood) differently by different caregivers and centers that study sleep.  Studies may be given an apnea/hypopnea index (AHI). This is a number which is found by counting the times of no breathing or under breathing during the night, and relating those numbers to the amount of time spent in bed. When the AHI is greater than 15, the patient  is likely to complain of daytime sleepiness. When the AHI is greater than 30, the patient is at increased risk for heart problems and must be followed more closely. Following the AHI also allows you to know how treatment is working. Simple oximetry (tracking the amount of oxygen that is taken in) can be used for screening patients who:  Do not have symptoms (problems) of OSA.  Have a normal Epworth Sleepiness Scale Score.  Have a low pre-test probability of having OSA.  Have none of the upper airway problems likely to cause apnea.  Oximetry is also used to determine if treatment is effective in patients who showed significant desaturations (not getting enough oxygen) on their home sleep study. One extra measure of safety is to perform additional studies for the person who only snores. This is because no one can predict with absolute certainty who will have OSA. Those who show significant desaturations (not getting enough oxygen) are recommended to have a more detailed sleep study. Document Released: 07/09/2002 Document Revised: 03/27/2011 Document Reviewed: 01/02/2005 [facility] Patient Information 2014 [place], [place].

## 2013-01-15 ENCOUNTER — Other Ambulatory Visit: Payer: Self-pay | Admitting: Cardiovascular Disease

## 2013-02-04 ENCOUNTER — Other Ambulatory Visit: Payer: Self-pay | Admitting: Cardiovascular Disease

## 2013-03-06 ENCOUNTER — Telehealth: Payer: Self-pay | Admitting: Pulmonary Disease

## 2013-03-07 NOTE — Telephone Encounter (Signed)
I have spoken with the pt. He is unable to use his CPAP machine due to a burning sensation in his nose. I have advised him that we do not have any openings before 03/25/13. He agreed and verbalized understanding. Advised that if he believes he is having a heart attack to seek emergency care.

## 2013-03-25 ENCOUNTER — Encounter: Payer: Self-pay | Admitting: Pulmonary Disease

## 2013-03-25 ENCOUNTER — Ambulatory Visit (INDEPENDENT_AMBULATORY_CARE_PROVIDER_SITE_OTHER)
Admission: RE | Admit: 2013-03-25 | Discharge: 2013-03-25 | Disposition: A | Discharge status: Home / Self Care | Payer: Medicare Other | Source: Ambulatory Visit | Attending: Pulmonary Disease | Admitting: Pulmonary Disease

## 2013-03-25 ENCOUNTER — Ambulatory Visit (INDEPENDENT_AMBULATORY_CARE_PROVIDER_SITE_OTHER): Payer: Medicare Other | Admitting: Pulmonary Disease

## 2013-03-25 VITALS — BP 124/76 | HR 107 | Temp 97.0°F | Ht 64.5 in | Wt 245.0 lb

## 2013-03-25 DIAGNOSIS — G4733 Obstructive sleep apnea (adult) (pediatric): Secondary | ICD-10-CM

## 2013-03-25 DIAGNOSIS — R0989 Other specified symptoms and signs involving the circulatory and respiratory systems: Secondary | ICD-10-CM

## 2013-03-25 DIAGNOSIS — R0609 Other forms of dyspnea: Secondary | ICD-10-CM

## 2013-03-25 NOTE — Patient Instructions (Signed)
We will try to get you a new machine CPAP supplies Home sleep study CXR today

## 2013-03-25 NOTE — Assessment & Plan Note (Addendum)
CXR  Today - unexplained wt loss

## 2013-03-25 NOTE — Assessment & Plan Note (Signed)
We will try to get you a new machine CPAP supplies Home sleep study, based on this we will order and in lab CPAP titration and proceed with getting him in the sleep. I have asked him to use saline nasal spray for humidification in the meantime.  Weight loss encouraged, compliance with goal of at least 4-6 hrs every night is the expectation. Advised against medications with sedative side effects Cautioned against driving when sleepy - understanding that sleepiness will vary on a day to day basis

## 2013-03-25 NOTE — Progress Notes (Signed)
Subjective:    Patient ID: Harold Jennings, male    DOB: [birth year], 80 y.o.   MRN: [id number]  HPI  PCP -Levin Erp  Chief Complaint  Patient presents with  . sleep consult    Had sleep study 25 years ago. he feels like when he uses the CPAP and inhales his nostrils burn. Has not had new machine since started on CPAP 25 years ago.     80 year old retired [employer] with coronary artery disease, spinal stenosis and hypertension presents for evaluation of obstructive sleep apnea. He had a polysomnogram in [place], [place] in 1991 and has been maintained on nasal CPAP since with good compliance.for the past 2 weeks he reports burning around his nostrils and therefore quit using the CPAP and has developed increased somnolence.He has not changed his CPAP supplies for at least a year. Epworth sleepiness score is 11/24 Bedtime is around 10:30 PM, sleep latency is minimal, he sleeps on his back with 2 pillows, reports several awakenings including bathroom visits without any post void sleep latency and is out of bed by 8:30 AM feeling tired with dryness of mouth. He is lost 20 pounds over the last year  for no clear reason.  There is no history suggestive of cataplexy, sleep paralysis or parasomnias   Past Medical History  Diagnosis Date  . Dyslipidemia   . HTN (hypertension)   . Obesity   . CAD (coronary artery disease)     Silent MI sometime prior to 2000. S/P cath in 2000 showing a total mid RCA with collaterals from the LCX. EF is 40 to50%; Last Myoview in 2010 showing inferior scar without ischemia. EF was 52%.  . Increased liver enzymes     felt to be a fatty liver per ultrasound in 2011  . Dyspnea   . Ischemic cardiomyopathy   . Spinal stenosis     Past Surgical History  Procedure Laterality Date  . Total hip arthroplasty    . Eye surgery    . Lower back surgery      spinal stenosis  . Shoulder surgery    . Cardioversion N/A 06/06/2012    Procedure: CARDIOVERSION;  Surgeon:  Thayer Headings, MD;  Location: [facility] ENDOSCOPY;  Service: Cardiovascular;  Laterality: N/A;    No Known Allergies  History   Social History  . Marital Status: Married    Spouse Name: N/A    Number of Children: 2  . Years of Education: N/A   Occupational History  . retired    Social History Main Topics  . Smoking status: Never Smoker   . Smokeless tobacco: Not on file  . Alcohol Use: Yes     Comment: 2-4 glasses of wine daily  . Drug Use: No  . Sexual Activity: No   Other Topics Concern  . Not on file   Social History Narrative  . No narrative on file    Family History  Problem Relation Age of Onset  . Stroke Father   . Heart attack Father   . Hypertension Father   . Diabetes Father   . Drug abuse Mother   . Hypertension Brother   . Hypertension Brother   . Hypertension Brother   . Hypertension Sister       Review of Systems  Constitutional: Negative for fever and unexpected weight change.  HENT: Positive for sneezing. Negative for congestion, dental problem, ear pain, nosebleeds, postnasal drip, rhinorrhea, sinus pressure, sore throat and trouble swallowing.   Eyes:  Negative for redness and itching.  Respiratory: Positive for shortness of breath. Negative for cough, chest tightness and wheezing.   Cardiovascular: Negative for palpitations and leg swelling.  Gastrointestinal: Negative for nausea and vomiting.  Genitourinary: Negative for dysuria.  Musculoskeletal: Negative for joint swelling.  Skin: Negative for rash.  Neurological: Negative for headaches.  Hematological: Does not bruise/bleed easily.  Psychiatric/Behavioral: Negative for dysphoric mood. The patient is not nervous/anxious.        Objective:   Physical Exam  Gen. Pleasant, obese, in no distress, normal affect ENT - no lesions, no post nasal drip, class 2-3 airway Neck: No JVD, no thyromegaly, no carotid bruits Lungs: no use of accessory muscles, no dullness to percussion, decreased  without rales or rhonchi  Cardiovascular: Rhythm regular, heart sounds  normal, no murmurs or gallops, no peripheral edema Abdomen: soft and non-tender, no hepatosplenomegaly, BS normal. Musculoskeletal: No deformities, no cyanosis or clubbing Neuro:  alert, non focal, no tremors       Assessment & Plan:

## 2013-03-26 ENCOUNTER — Telehealth: Payer: Self-pay | Admitting: Pulmonary Disease

## 2013-03-26 NOTE — Telephone Encounter (Signed)
Result Note     Stable nodular density in the right upper lobe since 2010   lmtcb x1 for pt

## 2013-03-26 NOTE — Telephone Encounter (Signed)
Pt returning call.Harold Jennings ° °

## 2013-03-27 NOTE — Telephone Encounter (Signed)
Pt advised. Jennifer Castillo, CMA  

## 2013-04-01 ENCOUNTER — Telehealth: Payer: Self-pay | Admitting: Pulmonary Disease

## 2013-04-01 NOTE — Telephone Encounter (Signed)
Pt aware several pt's ahead of him also spoke to carol @[facility]  who is aware the test will be done within 2wks Joellen Jersey

## 2013-04-01 NOTE — Telephone Encounter (Signed)
I called and spoke with pt. He is suppose to have home sleep test done. [facility] do you know where he is at on the waiting list? thanks

## 2013-04-01 NOTE — Telephone Encounter (Signed)
lmtcb Sally E [place]tinger ° °

## 2013-05-01 ENCOUNTER — Telehealth: Payer: Self-pay | Admitting: Pulmonary Disease

## 2013-05-01 ENCOUNTER — Other Ambulatory Visit: Payer: Self-pay

## 2013-05-01 NOTE — Telephone Encounter (Signed)
Called pt, he has not had sleep study done or scheduled.  He has an open order dating from 03/25/2013 in his chart.  Will forward to [facility] to have scheduled stat.  Heleana from [name] is aware that he has not had his sleep study done.  We need to fax the results from this sleep study to [facility] when it is completed.  Thank you.

## 2013-05-02 DIAGNOSIS — G4733 Obstructive sleep apnea (adult) (pediatric): Secondary | ICD-10-CM

## 2013-05-14 ENCOUNTER — Encounter (HOSPITAL_BASED_OUTPATIENT_CLINIC_OR_DEPARTMENT_OTHER): Payer: Medicare Other | Attending: General Surgery

## 2013-05-14 ENCOUNTER — Telehealth: Payer: Self-pay | Admitting: Pulmonary Disease

## 2013-05-14 DIAGNOSIS — I10 Essential (primary) hypertension: Secondary | ICD-10-CM | POA: Insufficient documentation

## 2013-05-14 DIAGNOSIS — I2510 Atherosclerotic heart disease of native coronary artery without angina pectoris: Secondary | ICD-10-CM | POA: Insufficient documentation

## 2013-05-14 DIAGNOSIS — J4489 Other specified chronic obstructive pulmonary disease: Secondary | ICD-10-CM | POA: Insufficient documentation

## 2013-05-14 DIAGNOSIS — J449 Chronic obstructive pulmonary disease, unspecified: Secondary | ICD-10-CM | POA: Insufficient documentation

## 2013-05-14 DIAGNOSIS — Z8589 Personal history of malignant neoplasm of other organs and systems: Secondary | ICD-10-CM | POA: Insufficient documentation

## 2013-05-14 DIAGNOSIS — L97509 Non-pressure chronic ulcer of other part of unspecified foot with unspecified severity: Secondary | ICD-10-CM | POA: Insufficient documentation

## 2013-05-14 DIAGNOSIS — Z96649 Presence of unspecified artificial hip joint: Secondary | ICD-10-CM | POA: Insufficient documentation

## 2013-05-14 DIAGNOSIS — G4733 Obstructive sleep apnea (adult) (pediatric): Secondary | ICD-10-CM

## 2013-05-14 DIAGNOSIS — I252 Old myocardial infarction: Secondary | ICD-10-CM | POA: Insufficient documentation

## 2013-05-14 DIAGNOSIS — E669 Obesity, unspecified: Secondary | ICD-10-CM | POA: Insufficient documentation

## 2013-05-14 DIAGNOSIS — M1990 Unspecified osteoarthritis, unspecified site: Secondary | ICD-10-CM | POA: Insufficient documentation

## 2013-05-14 DIAGNOSIS — Z923 Personal history of irradiation: Secondary | ICD-10-CM | POA: Insufficient documentation

## 2013-05-14 NOTE — Telephone Encounter (Signed)
Pt aware that RA has been out of the country and will be back on Friday 05-16-13 and we will send message as a reminder to RA review home sleep study asap and advise. Pt would like a call back by Monday if at all possible as he has been waiting a while for the results. I apologized to the patient for the wait time.

## 2013-05-14 NOTE — Telephone Encounter (Signed)
lmtcb x1 Dr. Elsworth Soho has been out of the country. He will be in the office on Friday. Will make sure this is addressed then.

## 2013-05-15 NOTE — Progress Notes (Signed)
[facility]  Harold Jennings, Harold Jennings               ACCOUNT NO.:  [id number]  MEDICAL RECORD NO.:  [id number]      DATE OF BIRTH:  Jun 07, 1932  PHYSICIAN:  Judene Companion, M.D.      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  This was followed with a lymph node dissection and radiation treatments.  Later, he had a biopsy for possible recurrence and now he is left with a chronic ulcer on the dorsal aspect of his foot.  It is about a cm in diameter with a lot of purulent material.  The depth is down to the subcutaneous tissue. Unfortunately, he had radiation to this foot and it has been present and getting worse and I think with the radiation it is going to be difficult to heal.  He also has a history of COPD, atrial fibrillation, hypertension and a previous myocardial infarction.  He has also got osteoarthritis.  He has had a hip replacement, and he has had surgery for spinal stenosis.  When he came here today, he was found to have a blood pressure of 129/91, pulse 101, temperature 97.  He weighs 240 pounds.  He really is a problem because this wound will have a great difficulty in healing.  I am going to try to clean it up, and eventually use some biologic grafts.  I will write for his insurance company about possibilities of hyperbaric oxygen treatments.  DIAGNOSES: 1. 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 2. Obesity. 3. Coronary artery disease. 4. Hypertension. 5. History of radiation treatments to the foot with resulting chronic     ulcer at the operative site on the dorsal aspect of his right foot.     Judene Companion, M.D.     PP/MEDQ  D:  05/14/2013  T:  05/15/2013  Job:  [id number]

## 2013-05-16 DIAGNOSIS — G4733 Obstructive sleep apnea (adult) (pediatric): Secondary | ICD-10-CM

## 2013-05-16 NOTE — Telephone Encounter (Signed)
lmomtcb x1 

## 2013-05-16 NOTE — Telephone Encounter (Signed)
Home study showed severe OSA + central apneas  Proceed with inlab titration

## 2013-05-19 NOTE — Telephone Encounter (Signed)
I contacted patient to schedule CPAP Titration Study and pt was wanting to know why he had to go to the sleep lab. Pt thought the HST didn't work. I explained to patient that the HST did work and that Dr. Elsworth Soho wanted to send patient for in lab study. Pt wants to speak with the nurse before I schedule the CPAP Titration Study.  After speaking with the patient, if he is agreeable to do the study, please transfer pt to me. Thanks, Catha Gosselin

## 2013-05-20 NOTE — Telephone Encounter (Signed)
Spoke with patient and scheduled patient for CPAP Titration Study for Friday 06/27/13. Called and spoke with patient and he is aware of this appointment. Have also placed patient on cancellation list for an earlier Study. Sleep packet mailed to patient. Pt is aware of appointment and aware that he has been placed on cancellation list.  Nothing else needed at this time. Rhonda J Cobb

## 2013-05-20 NOTE — Telephone Encounter (Signed)
I have spoken with the pt. He is agreeing to do CPAP titration study. Please schedule this. Thanks!

## 2013-05-21 ENCOUNTER — Encounter (HOSPITAL_BASED_OUTPATIENT_CLINIC_OR_DEPARTMENT_OTHER): Payer: Medicare Other | Attending: General Surgery

## 2013-05-21 DIAGNOSIS — L97509 Non-pressure chronic ulcer of other part of unspecified foot with unspecified severity: Secondary | ICD-10-CM | POA: Insufficient documentation

## 2013-05-21 DIAGNOSIS — Y842 Radiological procedure and radiotherapy as the cause of abnormal reaction of the patient, or of later complication, without mention of misadventure at the time of the procedure: Secondary | ICD-10-CM | POA: Insufficient documentation

## 2013-05-21 DIAGNOSIS — Z923 Personal history of irradiation: Secondary | ICD-10-CM | POA: Insufficient documentation

## 2013-05-21 DIAGNOSIS — Y838 Other surgical procedures as the cause of abnormal reaction of the patient, or of later complication, without mention of misadventure at the time of the procedure: Secondary | ICD-10-CM | POA: Insufficient documentation

## 2013-05-21 DIAGNOSIS — T8189XA Other complications of procedures, not elsewhere classified, initial encounter: Secondary | ICD-10-CM | POA: Insufficient documentation

## 2013-06-17 ENCOUNTER — Telehealth: Payer: Self-pay | Admitting: Pulmonary Disease

## 2013-06-17 NOTE — Telephone Encounter (Signed)
Called spoke with pt. He is scheduled to have sleep study done 06/27/13. He will be leaving to go out of town 07/08/13-07/16/13. He would like results before he leaves. I advised will forward to RA as an [place]

## 2013-06-20 ENCOUNTER — Encounter (HOSPITAL_BASED_OUTPATIENT_CLINIC_OR_DEPARTMENT_OTHER): Payer: Medicare Other | Attending: General Surgery

## 2013-06-20 DIAGNOSIS — T66XXXS Radiation sickness, unspecified, sequela: Secondary | ICD-10-CM | POA: Insufficient documentation

## 2013-06-20 DIAGNOSIS — L97509 Non-pressure chronic ulcer of other part of unspecified foot with unspecified severity: Secondary | ICD-10-CM | POA: Insufficient documentation

## 2013-06-20 DIAGNOSIS — Y842 Radiological procedure and radiotherapy as the cause of abnormal reaction of the patient, or of later complication, without mention of misadventure at the time of the procedure: Secondary | ICD-10-CM | POA: Insufficient documentation

## 2013-06-23 ENCOUNTER — Other Ambulatory Visit: Payer: Self-pay | Admitting: Cardiovascular Disease

## 2013-06-25 ENCOUNTER — Ambulatory Visit (HOSPITAL_BASED_OUTPATIENT_CLINIC_OR_DEPARTMENT_OTHER): Payer: Medicare Other | Attending: Pulmonary Disease | Admitting: Radiology

## 2013-06-25 VITALS — Ht 65.0 in | Wt 235.0 lb

## 2013-06-25 DIAGNOSIS — R0989 Other specified symptoms and signs involving the circulatory and respiratory systems: Secondary | ICD-10-CM | POA: Insufficient documentation

## 2013-06-25 DIAGNOSIS — I10 Essential (primary) hypertension: Secondary | ICD-10-CM | POA: Insufficient documentation

## 2013-06-25 DIAGNOSIS — G4733 Obstructive sleep apnea (adult) (pediatric): Secondary | ICD-10-CM

## 2013-06-25 DIAGNOSIS — M4800 Spinal stenosis, site unspecified: Secondary | ICD-10-CM | POA: Insufficient documentation

## 2013-06-25 DIAGNOSIS — R0609 Other forms of dyspnea: Secondary | ICD-10-CM | POA: Insufficient documentation

## 2013-06-25 DIAGNOSIS — I2510 Atherosclerotic heart disease of native coronary artery without angina pectoris: Secondary | ICD-10-CM | POA: Insufficient documentation

## 2013-06-26 ENCOUNTER — Telehealth: Payer: Self-pay | Admitting: Pulmonary Disease

## 2013-06-26 DIAGNOSIS — G4733 Obstructive sleep apnea (adult) (pediatric): Secondary | ICD-10-CM

## 2013-06-26 NOTE — Telephone Encounter (Signed)
Will be available  In 5-7 days

## 2013-06-26 NOTE — Telephone Encounter (Signed)
Pt aware and would like results as soon as we can give them. Will forward to RA as a reminder

## 2013-06-26 NOTE — Telephone Encounter (Signed)
I called spoke with pt. He had sleep study last night and is leaving town 07/08/13. Please advise Dr. Elsworth Soho thanks

## 2013-06-27 ENCOUNTER — Encounter (HOSPITAL_BASED_OUTPATIENT_CLINIC_OR_DEPARTMENT_OTHER): Payer: Medicare Other

## 2013-06-30 ENCOUNTER — Encounter: Payer: Self-pay | Admitting: Physician Assistant

## 2013-06-30 ENCOUNTER — Ambulatory Visit (INDEPENDENT_AMBULATORY_CARE_PROVIDER_SITE_OTHER): Payer: Medicare Other | Admitting: Physician Assistant

## 2013-06-30 VITALS — BP 124/79 | HR 96 | Ht 65.0 in | Wt 232.0 lb

## 2013-06-30 DIAGNOSIS — R634 Abnormal weight loss: Secondary | ICD-10-CM

## 2013-06-30 DIAGNOSIS — G4730 Sleep apnea, unspecified: Secondary | ICD-10-CM

## 2013-06-30 DIAGNOSIS — C4A9 Merkel cell carcinoma, unspecified: Secondary | ICD-10-CM

## 2013-06-30 DIAGNOSIS — L989 Disorder of the skin and subcutaneous tissue, unspecified: Secondary | ICD-10-CM

## 2013-06-30 DIAGNOSIS — G4710 Hypersomnia, unspecified: Secondary | ICD-10-CM

## 2013-06-30 DIAGNOSIS — I4891 Unspecified atrial fibrillation: Secondary | ICD-10-CM

## 2013-06-30 DIAGNOSIS — I2510 Atherosclerotic heart disease of native coronary artery without angina pectoris: Secondary | ICD-10-CM

## 2013-06-30 DIAGNOSIS — I5032 Chronic diastolic (congestive) heart failure: Secondary | ICD-10-CM

## 2013-06-30 DIAGNOSIS — G4733 Obstructive sleep apnea (adult) (pediatric): Secondary | ICD-10-CM

## 2013-06-30 DIAGNOSIS — I10 Essential (primary) hypertension: Secondary | ICD-10-CM

## 2013-06-30 NOTE — Telephone Encounter (Signed)
lmtcb

## 2013-06-30 NOTE — Assessment & Plan Note (Signed)
Recent sleep study. Has appointment for followup with Dr. Elsworth Soho.

## 2013-06-30 NOTE — Telephone Encounter (Signed)
Events corrected by BiPAP at 20/16 centimeters with a medium fullface mask  Prescription is sent to DME Please arrange follow up in one month the download

## 2013-06-30 NOTE — Telephone Encounter (Signed)
Pt returned call. Results and recs explained to pt and informed pt that a f/u visit with RA is needed to further explain the sleep study results. Appt was made for RA on 7/2 at 10am for review of sleep study. Pt verbalized understanding and denied any further questions or concerns at this time.

## 2013-06-30 NOTE — Assessment & Plan Note (Signed)
Blood pressure control.

## 2013-06-30 NOTE — Patient Instructions (Signed)
Your physician wants you to follow-up in: 6 MONTHS WITH DR. Emelda Fear will receive a reminder letter in the mail two months in advance. If you don't receive a letter, please call our office to schedule the follow-up appointment.  Your physician recommends that you continue on your current medications as directed. Please refer to the Current Medication list given to you today.   PROVIDER ADVICE THAT YOU TAKE YOUR XARELTO WITH FOOD

## 2013-06-30 NOTE — Assessment & Plan Note (Signed)
No recurrent chest pain. 

## 2013-06-30 NOTE — Assessment & Plan Note (Signed)
Patient states he's being treated for Merkel cell carcinoma by a physician in [place]. He has a nonhealing wound on his foot he is seeing the wound Center for. We have no records of this.

## 2013-06-30 NOTE — Assessment & Plan Note (Signed)
Patient has a necrotic-looking skin lesion on the top of his left ear. He actually cut a piece off and it did not bleed. I asked him to see his dermatologist concerning this.

## 2013-06-30 NOTE — Assessment & Plan Note (Signed)
Overall the patient's heart failure is controlled. He does have chronic lower extremity edema but does not like taking the Lasix. He only uses it every couple of weeks.

## 2013-06-30 NOTE — Assessment & Plan Note (Signed)
Patient has chronic atrial fibrillation. His rate was 96 when he came in here today. He has no symptoms of rapid heart beats. I will not increase his beta blocker today. Followup with Dr. Burt Knack. Continue Xarelto.

## 2013-06-30 NOTE — Sleep Study (Signed)
[name]  NAME: Harold Jennings  DATE OF BIRTH: 02-07-1932  MEDICAL RECORD NUMBER [id number]  LOCATION: [facility]  PHYSICIAN: Jackob Crookston V.  DATE OF STUDY: 06/25/13   SLEEP STUDY TYPE: CPAP titration study               REFERRING PHYSICIAN: Rigoberto Noel, MD  INDICATION FOR STUDY: 80 year old retired [employer] with coronary artery disease, spinal stenosis and hypertension with severe obstructive sleep apnea. He had a polysomnogram in [place], [place] in 1991 and has been maintained on nasal CPAP since with good compliance. Home study showed severe OSA and suggested central apneas  Hence the CPAP titration study was scheduled At the time of this study ,they weighed 235 pounds with a height of  5 ft 5 inches and the BMI of 39, neck size of 18.5 inches. Epworth sleepiness score was 6.   This CPAP titration polysomnogram was performed with a sleep technologist in attendance. EEG, EOG,EMG and respiratory parameters recorded. Sleep stages, arousals, limb movements and respiratory data was scored according to criteria laid out by the American Academy of sleep medicine.  SLEEP ARCHITECTURE: Lights out was at 2212 PM and lights on was at 451 AM. Total sleep time was 347 minutes with sleep period time of 393 minutes and sleep efficiency of 87% .Sleep latency was 5 minutes with latency to REM sleep of 50 minutes and wake after sleep onset of 46 minutes.  Sleep stages as a percentage of total sleep time was N1 -11.7 %,N2- 66 % and REM sleep 22 % ( 76 minutes) . The longest period of REM sleep was around 2 AM.   AROUSAL DATA : There were 129 arousals with an arousal index of 22 events per hour. Of these 29 were spontaneous, and 100 were associated with respiratory events and 0 were associated periodic limb movements  RESPIRATORY DATA: CPAP was initiated at 5 centimeters and titrated to a final level of 11 cm.Due to persistent respiratory events, mixed and  central apneas and snoring, BiPAP was initiated 13/9 and titrated to a final level of 22/18.Marland Kitchen At the  level of 20/16 centimeters for 48 minutes, there were 3 obstructive apneas, 0 central apneas, 0 mixed apneas and 1 hypopneas with apnea -hypopnea index of 5 events per hour.  There was no relation to sleep stage or body position. Titration was optimal. Supine REM sleep was noted. Final level of 2218 cm for 20 minutes including minutes of REM sleep no events or desaturations were noted. This appears to be the optimal level  MOVEMENT/PARASOMNIA: There were 0 PLMS with a PLM index of 0 events per hour. The PLM arousal index was 0 events per hour.  OXYGEN DATA: The lowest desaturation was 79 % during non-REM sleep and the desaturation index was 21 per hour. The saturations stayed below 88% for 12 minutes.  CARDIAC DATA: The low heart rate was 35 beats per minute. The high heart rate recorded was an artifact. No arrhythmias were noted  Discussion - note that mixed and central apneas persisted on CPAP. Hence BiPAP as required. He seemed to tolerate BiPAP much better.  IMPRESSION :  1. severe obstructive sleep apnea with mixed and central apneas and hypopneas causing sleep fragmentation and mild oxygen desaturation. 2. This was corrected by BiPAP of 22/18 centimeters with a medium fullface mask. Titration was optimal. 3. No evidence of cardiac arrhythmias or behavioral disturbance during sleep. 4. Periodic limb movements were not noted.  RECOMMENDATION:    1. The treatment options for this degree of sleep disordered breathing includes weight loss and BiPAP therapy. BiPAP can be initiated at 20/16 centimeters with a medium fullface mask and compliance monitored at this level. If significant events are noted on download, BiPAP  can be increased to optimal level of 22/18 2. Patient should be cautioned against driving when sleepy 3. They should be asked to avoid medications with sedative side  effects  Rigoberto Noel  MD Diplomate, American Board of Sleep Medicine  ELECTRONICALLY SIGNED ON: 06/30/2013  [facility] PH: [phone]   FX: ([phone]) [place]

## 2013-06-30 NOTE — Assessment & Plan Note (Signed)
Patient has 30 pound weight loss since December. He has no appetite. I asked him to take his Xarelto with food. His may be a combination of things including the treatment for Merkel cell carcinoma. Followup with Dr. Nyoka Cowden if this does not improve.

## 2013-06-30 NOTE — Progress Notes (Signed)
HPI: This is a 80 year old male patient of Dr. Burt Knack who has chronic atrial fibrillation with failed cardioversion in 2014. He has been managed with rate control and Xarelto. He also has history of CAD with cath in 2000 showing total RCA with collaterals from the circumflex, EF 40-50%. Myoview in 2010 showing impaired scar without ischemia EF 52% He has OSA on CPAP with recent repeat sleep study.  Patient is here for six-month followup. He has had no cardiac complaints. He has chronic dyspnea on exertion. He denies chest pain, palpitations, dizziness, or presyncope.   He complains of lack of appetite and has lost about 30 pounds since December. He is also being treated for Merkel cell carcinoma by a doctor in [place]. It was found on his foot and groin region. His foot is not healing and he is being treated at the wound Center. He recently had full lab work by Dr. Levin Erp, his primary care. He only takes his Lasix once every couple weeks because he doesn't want to be running to the bathroom.  No Known Allergies  Current Outpatient Prescriptions on File Prior to Visit: aspirin 81 MG tablet, Take 81 mg by mouth daily., Disp: , Rfl:  cephALEXin (KEFLEX) 500 MG capsule, 4 times daily, Disp: , Rfl:  clobetasol cream (TEMOVATE) 0.05 %, As directed, Disp: , Rfl:  doxazosin (CARDURA) 8 MG tablet, Take 8 mg by mouth at bedtime., Disp: , Rfl:  ezetimibe (ZETIA) 10 MG tablet, Take 10 mg by mouth daily., Disp: , Rfl:  furosemide (LASIX) 40 MG tablet, Take 1 tablet (40 mg total) by mouth daily., Disp: 30 tablet, Rfl: 6 gabapentin (NEURONTIN) 600 MG tablet, Take 1/2 tab in the am ans 1/2 - to 1 whole tab in the evening, Disp: , Rfl:  levothyroxine (SYNTHROID, LEVOTHROID) 88 MCG tablet, Take 88 mcg by mouth daily before breakfast., Disp: , Rfl:  lisinopril (PRINIVIL,ZESTRIL) 20 MG tablet, TAKE 1 TABLET DAILY, Disp: 90 tablet, Rfl: 0 metoprolol succinate (TOPROL-XL) 50 MG 24 hr tablet, Take 1 tablet (50 mg  total) by mouth daily., Disp: 30 tablet, Rfl: 5 Multiple Vitamin (MULTIVITAMIN) tablet, Take 1 tablet by mouth daily., Disp: , Rfl:  omeprazole (PRILOSEC) 20 MG capsule, 1 tab daily, Disp: , Rfl:  Rivaroxaban (XARELTO) 20 MG TABS, Take 1 tablet (20 mg total) by mouth daily with supper., Disp: 30 tablet, Rfl: 6 [DISCONTINUED] hydrochlorothiazide 25 MG tablet, Take 1 tablet (25 mg total) by mouth daily., Disp: 30 tablet, Rfl: 2  No current facility-administered medications on file prior to visit.   Past Medical History:   Dyslipidemia                                                 HTN (hypertension)                                           Obesity                                                      CAD (coronary artery disease)  Comment:Silent MI sometime prior to 2000. S/P cath in               2000 showing a total mid RCA with collaterals               from the LCX. EF is 40 to50%; Last Myoview in               2010 showing inferior scar without ischemia. EF              was 52%.   Increased liver enzymes                                        Comment:felt to be a fatty liver per ultrasound in 2011   Dyspnea                                                      Ischemic cardiomyopathy                                      Spinal stenosis                                             Past Surgical History:   TOTAL HIP ARTHROPLASTY                                        EYE SURGERY                                                   lower back surgery                                              Comment:spinal stenosis   SHOULDER SURGERY                                              CARDIOVERSION                                   N/A 06/06/2012      Comment:Procedure: CARDIOVERSION;  Surgeon: Thayer Headings, MD;  Location: [facility];  Service:               Cardiovascular;  Laterality: N/A;  Review of patient's family history indicates:    Stroke  Father                   Heart attack                   Father                   Hypertension                   Father                   Diabetes                       Father                   Drug abuse                     Mother                   Hypertension                   Brother                  Hypertension                   Brother                  Hypertension                   Brother                  Hypertension                   Sister                   Social History   Marital Status: Married             Spouse Name:                      Years of Education:                 Number of children: 2           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 Ear:  Necrotic-looking skin tag on the top of his left ear that he cut a piece off of. Neck: No JVD, HJR, Bruit, or thyroid enlargement  Lungs: No tachypnea, clear without wheezing, rales, or rhonchi  Cardiovascular: Irregular irregular at 96 beats per minute, PMI not displaced, heart sounds distant, no murmurs, gallops, bruit, thrill, or heave.  Abdomen: BS normal. Soft without organomegaly, masses, lesions or tenderness.  Extremities: Trace of edema bilaterally, otherwise lower extremities without cyanosis, clubbing . Left foot bandage being treated at the wound clinic. Good distal pulses bilateral  SKin: Warm,  or rashes, Necrotic-looking skin tag on the  top of his left ear that he cut a piece off of.  Musculoskeletal: No deformities  Neuro: no focal signs     EKG: Atrial fib at 96 beats per minute with right bundle branch block  2-D echo 2013 Study Conclusions  - Left ventricle: The cavity size was normal. There was mild   focal basal and mild concentric hypertrophy of the septum.   Systolic function was normal. The estimated ejection   fraction was in the range of 55% to 60%. Wall motion was   normal; there were no regional wall motion abnormalities.   Doppler parameters are consistent with abnormal left   ventricular relaxation (grade 1 diastolic dysfunction). - Aortic valve: Trivial regurgitation. - Mitral valve: Mild prolapse, involving the posterior   leaflet. Mild regurgitation. - Left atrium: The atrium was mildly dilated.

## 2013-07-01 ENCOUNTER — Telehealth: Payer: Self-pay | Admitting: Pulmonary Disease

## 2013-07-01 NOTE — Telephone Encounter (Signed)
Called and spoke to [place] with [name] inquiring if there was any information missing because Will from [facility] called earlier stating the order is invalid. Harold Jennings stated that the order was received and is being processed and nothing further is needed at this time and if something were to arise to call back.

## 2013-07-01 NOTE — Telephone Encounter (Addendum)
Called spoke with [place] from [facility]. They received order. It take 3-9 business days to process and get approval through insurance. They can;t rush this any faster Called pt and LMTCB x1  Pt is aware of the above. Nothing further needed

## 2013-07-16 ENCOUNTER — Other Ambulatory Visit: Payer: Self-pay | Admitting: Cardiovascular Disease

## 2013-07-17 ENCOUNTER — Ambulatory Visit: Payer: Medicare Other | Admitting: Pulmonary Disease

## 2013-07-17 ENCOUNTER — Encounter (HOSPITAL_BASED_OUTPATIENT_CLINIC_OR_DEPARTMENT_OTHER): Payer: Medicare Other | Attending: Internal Medicine

## 2013-07-17 DIAGNOSIS — T66XXXS Radiation sickness, unspecified, sequela: Secondary | ICD-10-CM | POA: Insufficient documentation

## 2013-07-17 DIAGNOSIS — L97509 Non-pressure chronic ulcer of other part of unspecified foot with unspecified severity: Secondary | ICD-10-CM | POA: Insufficient documentation

## 2013-07-17 DIAGNOSIS — Y842 Radiological procedure and radiotherapy as the cause of abnormal reaction of the patient, or of later complication, without mention of misadventure at the time of the procedure: Secondary | ICD-10-CM | POA: Insufficient documentation

## 2013-07-21 ENCOUNTER — Ambulatory Visit (INDEPENDENT_AMBULATORY_CARE_PROVIDER_SITE_OTHER): Payer: Medicare Other | Admitting: Pulmonary Disease

## 2013-07-21 ENCOUNTER — Encounter: Payer: Self-pay | Admitting: Pulmonary Disease

## 2013-07-21 VITALS — BP 120/70 | HR 106 | Temp 97.6°F | Wt 237.6 lb

## 2013-07-21 DIAGNOSIS — G4733 Obstructive sleep apnea (adult) (pediatric): Secondary | ICD-10-CM

## 2013-07-21 DIAGNOSIS — L989 Disorder of the skin and subcutaneous tissue, unspecified: Secondary | ICD-10-CM

## 2013-07-21 DIAGNOSIS — I2510 Atherosclerotic heart disease of native coronary artery without angina pectoris: Secondary | ICD-10-CM

## 2013-07-21 NOTE — Progress Notes (Signed)
   Subjective:    Patient ID: Harold Jennings, male    DOB: [birth year], 80 y.o.   MRN: [id number]  HPI  80 year old retired [employer] with coronary artery disease, spinal stenosis and hypertension presents for evaluation of obstructive sleep apnea. He had a polysomnogram in [place], [place] in 1991 and was maintained on nasal CPAP since with good compliance.for the past 2 weeks he reports burning around his nostrils and therefore quit using the CPAP and has developed increased somnolence.He has not changed his CPAP supplies for at least a year.    CXR - Stable nodular density in the right upper lobe since 2010  Home study showed severe OSA + central apneas  235 pounds with a height of 5 ft 5 inches and the BMI of 39, neck size of 18.5 inches mixed and central apneas persisted on CPAP. Hence BiPAP was required  Events corrected by BiPAP at 20/16 centimeters with a medium fullface mask - target 22/18   [name] has not yet delivered his machine He c/o infected pustule on his back    Review of Systems neg for any significant sore throat, dysphagia, itching, sneezing, nasal congestion or excess/ purulent secretions, fever, chills, sweats, unintended wt loss, pleuritic or exertional cp, hempoptysis, orthopnea pnd or change in chronic leg swelling. Also denies presyncope, palpitations, heartburn, abdominal pain, nausea, vomiting, diarrhea or change in bowel or urinary habits, dysuria,hematuria, rash, arthralgias, visual complaints, headache, numbness weakness or ataxia.     Objective:   Physical Exam  Gen. Pleasant, obese, in no distress ENT - no lesions, no post nasal drip Neck: No JVD, no thyromegaly, no carotid bruits Lungs: no use of accessory muscles, no dullness to percussion, decreased without rales or rhonchi  Cardiovascular: Rhythm regular, heart sounds  normal, no murmurs or gallops, no peripheral edema Musculoskeletal: No deformities, no cyanosis or clubbing , no tremors Skin -  lipoma on back - infected pustule with pus expressed       Assessment & Plan:

## 2013-07-21 NOTE — Assessment & Plan Note (Signed)
For some reason, DME has not delivered Bipap Will change DMe if they do not deliver in next few days During  Study ,mixed and central apneas persisted on CPAP. Hence BiPAP was required  Weight loss encouraged, compliance with goal of at least 4-6 hrs every night is the expectation. Advised against medications with sedative side effects Cautioned against driving when sleepy - understanding that sleepiness will vary on a day to day basis

## 2013-07-21 NOTE — Assessment & Plan Note (Signed)
Will need I & D He will contact derm - if unable can call in keflex & refer to surgery

## 2013-07-21 NOTE — Patient Instructions (Signed)
Call us if Harold Jennings has not delivered the mcahine by end of this week Call Dermatology for infected pustule on back

## 2013-07-23 DIAGNOSIS — Y842 Radiological procedure and radiotherapy as the cause of abnormal reaction of the patient, or of later complication, without mention of misadventure at the time of the procedure: Secondary | ICD-10-CM | POA: Diagnosis not present

## 2013-07-23 DIAGNOSIS — T66XXXS Radiation sickness, unspecified, sequela: Secondary | ICD-10-CM | POA: Diagnosis not present

## 2013-07-23 DIAGNOSIS — L97509 Non-pressure chronic ulcer of other part of unspecified foot with unspecified severity: Secondary | ICD-10-CM | POA: Diagnosis not present

## 2013-07-25 ENCOUNTER — Telehealth: Payer: Self-pay | Admitting: Pulmonary Disease

## 2013-07-25 DIAGNOSIS — G4733 Obstructive sleep apnea (adult) (pediatric): Secondary | ICD-10-CM

## 2013-07-25 NOTE — Telephone Encounter (Signed)
lmtcb x1 for pt Order sent to [facility] for 2 week download

## 2013-07-25 NOTE — Telephone Encounter (Signed)
Spoke with Arbie Cookey from [facility]. She reports they set pt up with FF mask as Dr. Elsworth Soho requested right away. When they tried to call pt to get the bipap set up he resued. He told them he is going to use his current machine (CPAP) until it breaks down.   I called spoke with pt. He reports since his current machine works he wants to hold off on the BIPAP until he needs it. He did received his FF mask and really likes it. He reports if Dr. Elsworth Soho really wants him on the BIPAP and feels strongly about it then he will. He just doesn't like that he would have to rent the bipap machine.  Please advise Dr. Elsworth Soho thanks

## 2013-07-25 NOTE — Telephone Encounter (Signed)
Obtain download on cpap in 2 weeks to decide

## 2013-07-28 NOTE — Telephone Encounter (Signed)
I called spoke with pt. Made him aware once download received/reviewed in 2 weeks we will call him. Nothing further needed

## 2013-07-28 NOTE — Telephone Encounter (Signed)
LMTCBx2 to advise pt we are going to obtain a 2 week download. [place] Bing, CMA

## 2013-07-28 NOTE — Telephone Encounter (Signed)
Spoke with patient-he is aware that we are getting 2 week download. However patient would like RA to review asap and decide if he truly needs BiPAP-he feels he is ready for BiPAP if RA feels he needs it.

## 2013-07-30 DIAGNOSIS — L97509 Non-pressure chronic ulcer of other part of unspecified foot with unspecified severity: Secondary | ICD-10-CM | POA: Diagnosis not present

## 2013-07-30 DIAGNOSIS — T66XXXS Radiation sickness, unspecified, sequela: Secondary | ICD-10-CM | POA: Diagnosis not present

## 2013-07-31 DIAGNOSIS — T66XXXS Radiation sickness, unspecified, sequela: Secondary | ICD-10-CM | POA: Diagnosis not present

## 2013-07-31 DIAGNOSIS — L97509 Non-pressure chronic ulcer of other part of unspecified foot with unspecified severity: Secondary | ICD-10-CM | POA: Diagnosis not present

## 2013-08-01 DIAGNOSIS — L97509 Non-pressure chronic ulcer of other part of unspecified foot with unspecified severity: Secondary | ICD-10-CM | POA: Diagnosis not present

## 2013-08-01 DIAGNOSIS — T66XXXS Radiation sickness, unspecified, sequela: Secondary | ICD-10-CM | POA: Diagnosis not present

## 2013-08-04 DIAGNOSIS — L97509 Non-pressure chronic ulcer of other part of unspecified foot with unspecified severity: Secondary | ICD-10-CM | POA: Diagnosis not present

## 2013-08-04 DIAGNOSIS — T66XXXS Radiation sickness, unspecified, sequela: Secondary | ICD-10-CM | POA: Diagnosis not present

## 2013-08-05 DIAGNOSIS — T66XXXS Radiation sickness, unspecified, sequela: Secondary | ICD-10-CM | POA: Diagnosis not present

## 2013-08-05 DIAGNOSIS — L97509 Non-pressure chronic ulcer of other part of unspecified foot with unspecified severity: Secondary | ICD-10-CM | POA: Diagnosis not present

## 2013-08-06 DIAGNOSIS — L97509 Non-pressure chronic ulcer of other part of unspecified foot with unspecified severity: Secondary | ICD-10-CM | POA: Diagnosis not present

## 2013-08-06 DIAGNOSIS — T66XXXS Radiation sickness, unspecified, sequela: Secondary | ICD-10-CM | POA: Diagnosis not present

## 2013-08-07 DIAGNOSIS — T66XXXS Radiation sickness, unspecified, sequela: Secondary | ICD-10-CM | POA: Diagnosis not present

## 2013-08-07 DIAGNOSIS — L97509 Non-pressure chronic ulcer of other part of unspecified foot with unspecified severity: Secondary | ICD-10-CM | POA: Diagnosis not present

## 2013-08-08 DIAGNOSIS — L97509 Non-pressure chronic ulcer of other part of unspecified foot with unspecified severity: Secondary | ICD-10-CM | POA: Diagnosis not present

## 2013-08-08 DIAGNOSIS — T66XXXS Radiation sickness, unspecified, sequela: Secondary | ICD-10-CM | POA: Diagnosis not present

## 2013-08-11 DIAGNOSIS — T66XXXS Radiation sickness, unspecified, sequela: Secondary | ICD-10-CM | POA: Diagnosis not present

## 2013-08-11 DIAGNOSIS — L97509 Non-pressure chronic ulcer of other part of unspecified foot with unspecified severity: Secondary | ICD-10-CM | POA: Diagnosis not present

## 2013-08-12 DIAGNOSIS — T66XXXS Radiation sickness, unspecified, sequela: Secondary | ICD-10-CM | POA: Diagnosis not present

## 2013-08-12 DIAGNOSIS — L97509 Non-pressure chronic ulcer of other part of unspecified foot with unspecified severity: Secondary | ICD-10-CM | POA: Diagnosis not present

## 2013-08-13 DIAGNOSIS — T66XXXS Radiation sickness, unspecified, sequela: Secondary | ICD-10-CM | POA: Diagnosis not present

## 2013-08-13 DIAGNOSIS — L97509 Non-pressure chronic ulcer of other part of unspecified foot with unspecified severity: Secondary | ICD-10-CM | POA: Diagnosis not present

## 2013-08-14 DIAGNOSIS — L97509 Non-pressure chronic ulcer of other part of unspecified foot with unspecified severity: Secondary | ICD-10-CM | POA: Diagnosis not present

## 2013-08-14 DIAGNOSIS — T66XXXS Radiation sickness, unspecified, sequela: Secondary | ICD-10-CM | POA: Diagnosis not present

## 2013-08-15 DIAGNOSIS — L97509 Non-pressure chronic ulcer of other part of unspecified foot with unspecified severity: Secondary | ICD-10-CM | POA: Diagnosis not present

## 2013-08-15 DIAGNOSIS — T66XXXS Radiation sickness, unspecified, sequela: Secondary | ICD-10-CM | POA: Diagnosis not present

## 2013-08-18 ENCOUNTER — Encounter (HOSPITAL_BASED_OUTPATIENT_CLINIC_OR_DEPARTMENT_OTHER): Payer: Medicare Other | Attending: General Surgery

## 2013-08-18 DIAGNOSIS — Z859 Personal history of malignant neoplasm, unspecified: Secondary | ICD-10-CM | POA: Diagnosis not present

## 2013-08-18 DIAGNOSIS — Z923 Personal history of irradiation: Secondary | ICD-10-CM | POA: Diagnosis not present

## 2013-08-18 DIAGNOSIS — Y838 Other surgical procedures as the cause of abnormal reaction of the patient, or of later complication, without mention of misadventure at the time of the procedure: Secondary | ICD-10-CM | POA: Insufficient documentation

## 2013-08-18 DIAGNOSIS — L589 Radiodermatitis, unspecified: Secondary | ICD-10-CM | POA: Insufficient documentation

## 2013-08-18 DIAGNOSIS — L02219 Cutaneous abscess of trunk, unspecified: Secondary | ICD-10-CM | POA: Insufficient documentation

## 2013-08-18 DIAGNOSIS — L03319 Cellulitis of trunk, unspecified: Secondary | ICD-10-CM

## 2013-08-18 DIAGNOSIS — T8189XA Other complications of procedures, not elsewhere classified, initial encounter: Secondary | ICD-10-CM | POA: Diagnosis present

## 2013-08-18 DIAGNOSIS — Y842 Radiological procedure and radiotherapy as the cause of abnormal reaction of the patient, or of later complication, without mention of misadventure at the time of the procedure: Secondary | ICD-10-CM | POA: Insufficient documentation

## 2013-08-19 DIAGNOSIS — L02219 Cutaneous abscess of trunk, unspecified: Secondary | ICD-10-CM | POA: Diagnosis not present

## 2013-08-19 DIAGNOSIS — L03319 Cellulitis of trunk, unspecified: Secondary | ICD-10-CM | POA: Diagnosis not present

## 2013-08-19 DIAGNOSIS — L589 Radiodermatitis, unspecified: Secondary | ICD-10-CM | POA: Diagnosis not present

## 2013-08-19 DIAGNOSIS — Z923 Personal history of irradiation: Secondary | ICD-10-CM | POA: Diagnosis not present

## 2013-08-19 DIAGNOSIS — T8189XA Other complications of procedures, not elsewhere classified, initial encounter: Secondary | ICD-10-CM | POA: Diagnosis not present

## 2013-08-20 DIAGNOSIS — L02219 Cutaneous abscess of trunk, unspecified: Secondary | ICD-10-CM | POA: Diagnosis not present

## 2013-08-20 DIAGNOSIS — L03319 Cellulitis of trunk, unspecified: Secondary | ICD-10-CM | POA: Diagnosis not present

## 2013-08-20 DIAGNOSIS — T8189XA Other complications of procedures, not elsewhere classified, initial encounter: Secondary | ICD-10-CM | POA: Diagnosis not present

## 2013-08-20 DIAGNOSIS — L589 Radiodermatitis, unspecified: Secondary | ICD-10-CM | POA: Diagnosis not present

## 2013-08-20 DIAGNOSIS — Z923 Personal history of irradiation: Secondary | ICD-10-CM | POA: Diagnosis not present

## 2013-08-21 DIAGNOSIS — T8189XA Other complications of procedures, not elsewhere classified, initial encounter: Secondary | ICD-10-CM | POA: Diagnosis not present

## 2013-08-21 DIAGNOSIS — L02219 Cutaneous abscess of trunk, unspecified: Secondary | ICD-10-CM | POA: Diagnosis not present

## 2013-08-21 DIAGNOSIS — Z923 Personal history of irradiation: Secondary | ICD-10-CM | POA: Diagnosis not present

## 2013-08-21 DIAGNOSIS — L589 Radiodermatitis, unspecified: Secondary | ICD-10-CM | POA: Diagnosis not present

## 2013-08-22 DIAGNOSIS — T8189XA Other complications of procedures, not elsewhere classified, initial encounter: Secondary | ICD-10-CM | POA: Diagnosis not present

## 2013-08-22 DIAGNOSIS — L02219 Cutaneous abscess of trunk, unspecified: Secondary | ICD-10-CM | POA: Diagnosis not present

## 2013-08-22 DIAGNOSIS — Z923 Personal history of irradiation: Secondary | ICD-10-CM | POA: Diagnosis not present

## 2013-08-22 DIAGNOSIS — L589 Radiodermatitis, unspecified: Secondary | ICD-10-CM | POA: Diagnosis not present

## 2013-08-23 ENCOUNTER — Other Ambulatory Visit: Payer: Self-pay | Admitting: Cardiovascular Disease

## 2013-08-25 DIAGNOSIS — L02219 Cutaneous abscess of trunk, unspecified: Secondary | ICD-10-CM | POA: Diagnosis not present

## 2013-08-25 DIAGNOSIS — T8189XA Other complications of procedures, not elsewhere classified, initial encounter: Secondary | ICD-10-CM | POA: Diagnosis not present

## 2013-08-25 DIAGNOSIS — L589 Radiodermatitis, unspecified: Secondary | ICD-10-CM | POA: Diagnosis not present

## 2013-08-25 DIAGNOSIS — Z923 Personal history of irradiation: Secondary | ICD-10-CM | POA: Diagnosis not present

## 2013-08-27 DIAGNOSIS — Z923 Personal history of irradiation: Secondary | ICD-10-CM | POA: Diagnosis not present

## 2013-08-27 DIAGNOSIS — L589 Radiodermatitis, unspecified: Secondary | ICD-10-CM | POA: Diagnosis not present

## 2013-08-27 DIAGNOSIS — T8189XA Other complications of procedures, not elsewhere classified, initial encounter: Secondary | ICD-10-CM | POA: Diagnosis not present

## 2013-08-27 DIAGNOSIS — L02219 Cutaneous abscess of trunk, unspecified: Secondary | ICD-10-CM | POA: Diagnosis not present

## 2013-08-29 ENCOUNTER — Telehealth: Payer: Self-pay | Admitting: Cardiovascular Disease

## 2013-08-29 DIAGNOSIS — L02219 Cutaneous abscess of trunk, unspecified: Secondary | ICD-10-CM | POA: Diagnosis not present

## 2013-08-29 DIAGNOSIS — Z923 Personal history of irradiation: Secondary | ICD-10-CM | POA: Diagnosis not present

## 2013-08-29 DIAGNOSIS — T8189XA Other complications of procedures, not elsewhere classified, initial encounter: Secondary | ICD-10-CM | POA: Diagnosis not present

## 2013-08-29 DIAGNOSIS — L589 Radiodermatitis, unspecified: Secondary | ICD-10-CM | POA: Diagnosis not present

## 2013-08-29 NOTE — Telephone Encounter (Signed)
New problem:    Per pt needs appt now with Dr Burt Knack.  Per pt he has afib irregular heart beat.  Pt stated 911 came to his hose he did not want to go with them he assured them he will make an appointment with his Card.    Please give pt a call back in next 71min after that he will be in a hyperbaric chamber for 2 hr.

## 2013-08-29 NOTE — Telephone Encounter (Signed)
Left message on machine for pt to contact the office.   

## 2013-09-01 DIAGNOSIS — T8189XA Other complications of procedures, not elsewhere classified, initial encounter: Secondary | ICD-10-CM | POA: Diagnosis not present

## 2013-09-01 DIAGNOSIS — Z923 Personal history of irradiation: Secondary | ICD-10-CM | POA: Diagnosis not present

## 2013-09-01 DIAGNOSIS — L589 Radiodermatitis, unspecified: Secondary | ICD-10-CM | POA: Diagnosis not present

## 2013-09-01 DIAGNOSIS — L02219 Cutaneous abscess of trunk, unspecified: Secondary | ICD-10-CM | POA: Diagnosis not present

## 2013-09-01 NOTE — Telephone Encounter (Signed)
Appointment scheduled with Dr Burt Knack on 09/02/13/lwb

## 2013-09-01 NOTE — Telephone Encounter (Signed)
Left message on machine for pt to contact the office.   

## 2013-09-02 ENCOUNTER — Encounter: Payer: Self-pay | Admitting: Cardiovascular Disease

## 2013-09-02 ENCOUNTER — Ambulatory Visit (INDEPENDENT_AMBULATORY_CARE_PROVIDER_SITE_OTHER): Payer: Medicare Other | Admitting: Cardiovascular Disease

## 2013-09-02 VITALS — BP 132/86 | HR 95 | Ht 65.0 in | Wt 225.2 lb

## 2013-09-02 DIAGNOSIS — I4891 Unspecified atrial fibrillation: Secondary | ICD-10-CM

## 2013-09-02 DIAGNOSIS — L02219 Cutaneous abscess of trunk, unspecified: Secondary | ICD-10-CM | POA: Diagnosis not present

## 2013-09-02 DIAGNOSIS — T8189XA Other complications of procedures, not elsewhere classified, initial encounter: Secondary | ICD-10-CM | POA: Diagnosis not present

## 2013-09-02 DIAGNOSIS — Z923 Personal history of irradiation: Secondary | ICD-10-CM | POA: Diagnosis not present

## 2013-09-02 DIAGNOSIS — L03319 Cellulitis of trunk, unspecified: Secondary | ICD-10-CM | POA: Diagnosis not present

## 2013-09-02 DIAGNOSIS — I5032 Chronic diastolic (congestive) heart failure: Secondary | ICD-10-CM

## 2013-09-02 DIAGNOSIS — L589 Radiodermatitis, unspecified: Secondary | ICD-10-CM | POA: Diagnosis not present

## 2013-09-02 DIAGNOSIS — I2510 Atherosclerotic heart disease of native coronary artery without angina pectoris: Secondary | ICD-10-CM

## 2013-09-02 MED ORDER — METOPROLOL SUCCINATE ER 50 MG PO TB24: ORAL_TABLET | ORAL | Status: DC

## 2013-09-02 MED ORDER — LISINOPRIL 10 MG PO TABS: ORAL_TABLET | ORAL | Status: DC

## 2013-09-02 NOTE — Patient Instructions (Addendum)
Your physician has recommended you make the following change in your medication:  1. DECREASE Lisinopril to 10mg  take one by mouth daily 2. INCREASE Metoprolol Succinate to 50mg  in the morning and 25mg  in the evening  Your physician has requested that you have an echocardiogram. Echocardiography is a painless test that uses sound waves to create images of your heart. It provides your doctor with information about the size and shape of your heart and how well your heart's chambers and valves are working. This procedure takes approximately one hour. There are no restrictions for this procedure.  Your physician recommends that you schedule a follow-up appointment in: 3 MONTHS with Dr Burt Knack

## 2013-09-02 NOTE — Progress Notes (Signed)
HPI:  80 year-old gentleman presenting for follow-up of chronic atrial fibrillation maintained on anticoagulation with Xarelto and rate control with  Metoprolol succinate. Other problems include CAD with known total occlusion of the RCA and mild segmental LV dysfunction, obesity, and Merkel cell carcinoma.   The patient reports decreased appetite. He has lost about 40 pounds. He's been evaluated by his primary care physician. He's had a good response by his report to radiation therapy for treatment of his Merkel cell carcinoma. He had a mechanical fall when getting out of bed recently. He was so weak that he could not pick himself up off the floor. His wife called EMS despite his urging not to do so. He was noted to be in atrial fibrillation with a heart rate in the 130s. He declined emergency room evaluation and was told to followup closely here.  The patient reports no recent episodes of chest pain, worsening leg edema, orthopnea, or PND. His shortness of breath has improved with weight loss. He does admit to episodes of dizziness, without a clear postural component. No other complaints.  Outpatient Encounter Prescriptions as of 09/02/2013  Medication Sig  . ciprofloxacin (CIPRO) 500 MG tablet   . clobetasol cream (TEMOVATE) 0.05 % As directed  . doxazosin (CARDURA) 8 MG tablet Take 8 mg by mouth at bedtime.  Marland Kitchen doxycycline (VIBRA-TABS) 100 MG tablet   . ezetimibe (ZETIA) 10 MG tablet Take 10 mg by mouth daily.  . furosemide (LASIX) 40 MG tablet Take 40 mg by mouth as needed.  . gabapentin (NEURONTIN) 600 MG tablet Take 1/2 tab in the am ans 1/2 - to 1 whole tab in the evening  . levothyroxine (SYNTHROID, LEVOTHROID) 88 MCG tablet Take 88 mcg by mouth daily before breakfast.  . metoprolol succinate (TOPROL-XL) 50 MG 24 hr tablet Take one tablet by mouth every morning, take one-half tablet every evening. Take with or immediately following a meal.  . Multiple Vitamin (MULTIVITAMIN) tablet Take 1  tablet by mouth daily.  Marland Kitchen omeprazole (PRILOSEC) 20 MG capsule 1 tab daily  . XARELTO 20 MG TABS tablet TAKE 1 TABLET BY MOUTH DAILY WITH SUPPER.  . [DISCONTINUED] lisinopril (PRINIVIL,ZESTRIL) 10 MG tablet TAKE 1 TABLET DAILY  . [DISCONTINUED] lisinopril (PRINIVIL,ZESTRIL) 20 MG tablet TAKE 1 TABLET DAILY  . [DISCONTINUED] metoprolol succinate (TOPROL-XL) 50 MG 24 hr tablet TAKE 1 TABLET (50 MG TOTAL) BY MOUTH DAILY.    No Known Allergies  Past Medical History  Diagnosis Date  . Dyslipidemia   . HTN (hypertension)   . Obesity   . CAD (coronary artery disease)     Silent MI sometime prior to 2000. S/P cath in 2000 showing a total mid RCA with collaterals from the LCX. EF is 40 to50%; Last Myoview in 2010 showing inferior scar without ischemia. EF was 52%.  . Increased liver enzymes     felt to be a fatty liver per ultrasound in 2011  . Dyspnea   . Ischemic cardiomyopathy   . Spinal stenosis     ROS: Negative except as per HPI  BP 132/86  Pulse 95  Ht 5\' 5"  (1.651 m)  Wt 225 lb 3.2 oz (102.15 kg)  BMI 37.48 kg/m2  PHYSICAL EXAM: Pt is alert and oriented, pleasant obese male, has noticeably lost weight. In NAD HEENT: normal Neck: JVP - normal, carotids 2+= without bruits Lungs: CTA bilaterally CV: Irregularly irregular without murmur or gallop Abd: soft, NT, Positive BS, no hepatomegaly Ext: no C/C/E,  distal pulses intact and equal Skin: warm/dry no rash  EKG:  Atrial fibrillation 95 bpm, RBBB, age indeterminate inferior MI  ASSESSMENT AND PLAN: 1. Chronic atrial fibrillation. His heart rate has been elevated. Recommend that he increase metoprolol XL so that he takes an additional 25 mg at bedtime. Advised to reduce lisinopril to 10 mg because of his frequent dizziness. He remains on Xarelto for anticoagulation. Will see him back in 3 months to make sure that his symptoms are improving.  2. Chronic diastolic heart failure. Appears stable. Clinically improved with weight  loss. He will continue Lasix as needed.  3. Hyperlipidemia. The patient takes zetia. He is statin intolerant. Lipids are followed by his primary care physician.  4. Hypertension. Medication changes as outlined above. Sherren Mocha 09/05/2013 8:11 AM

## 2013-09-03 DIAGNOSIS — T8189XA Other complications of procedures, not elsewhere classified, initial encounter: Secondary | ICD-10-CM | POA: Diagnosis not present

## 2013-09-03 DIAGNOSIS — L02219 Cutaneous abscess of trunk, unspecified: Secondary | ICD-10-CM | POA: Diagnosis not present

## 2013-09-03 DIAGNOSIS — L03319 Cellulitis of trunk, unspecified: Secondary | ICD-10-CM | POA: Diagnosis not present

## 2013-09-03 DIAGNOSIS — Z923 Personal history of irradiation: Secondary | ICD-10-CM | POA: Diagnosis not present

## 2013-09-03 DIAGNOSIS — L589 Radiodermatitis, unspecified: Secondary | ICD-10-CM | POA: Diagnosis not present

## 2013-09-04 ENCOUNTER — Ambulatory Visit (INDEPENDENT_AMBULATORY_CARE_PROVIDER_SITE_OTHER): Payer: Medicare Other | Admitting: Pulmonary Disease

## 2013-09-04 ENCOUNTER — Encounter: Payer: Self-pay | Admitting: Pulmonary Disease

## 2013-09-04 VITALS — BP 130/68 | HR 68 | Ht 64.5 in | Wt 225.0 lb

## 2013-09-04 DIAGNOSIS — I2510 Atherosclerotic heart disease of native coronary artery without angina pectoris: Secondary | ICD-10-CM

## 2013-09-04 DIAGNOSIS — L03319 Cellulitis of trunk, unspecified: Secondary | ICD-10-CM | POA: Diagnosis not present

## 2013-09-04 DIAGNOSIS — L589 Radiodermatitis, unspecified: Secondary | ICD-10-CM | POA: Diagnosis not present

## 2013-09-04 DIAGNOSIS — T8189XA Other complications of procedures, not elsewhere classified, initial encounter: Secondary | ICD-10-CM | POA: Diagnosis not present

## 2013-09-04 DIAGNOSIS — Z923 Personal history of irradiation: Secondary | ICD-10-CM | POA: Diagnosis not present

## 2013-09-04 DIAGNOSIS — G4733 Obstructive sleep apnea (adult) (pediatric): Secondary | ICD-10-CM

## 2013-09-04 DIAGNOSIS — L02219 Cutaneous abscess of trunk, unspecified: Secondary | ICD-10-CM | POA: Diagnosis not present

## 2013-09-04 NOTE — Progress Notes (Signed)
   Subjective:    Patient ID: Harold Jennings, male    DOB: 1932-10-14, 80 y.o.   MRN: [id number]  HPI 80 year old retired [employer] with coronary artery disease, spinal stenosis and hypertension presents for FU of obstructive sleep apnea.  polysomnogram in [place], [place] in 1991 and was maintained on nasal CPAP since. CXR - Stable nodular density in the right upper lobe since 2010  Home study showed severe OSA + central apneas  Titration 06/2013 -235 pounds with a height of 5 ft 5 inches and the BMI of 39, neck size of 18.5 inches mixed and central apneas persisted on CPAP. Hence BiPAP was required  Events corrected by BiPAP at 20/16 centimeters with a medium fullface mask - target 22/18    09/04/2013  Chief Complaint  Patient presents with  . Follow-up    Pt c/o his cpap mask being tight, "not user friendly", is concerned that his pressure may be too high.  Pt wears cpap up to 9 hours nightly.     Download shows-good compliance, average more than 6 hours, no residual events, AHI 4 per hour, small leak is present. He does complain about this leak. He changed has full face mask to a ResMed mirage and is still not happy with the seal. He wonders if the pressure can be decreased. Does not feel as rested as was prior machine     Review of Systems neg for any significant sore throat, dysphagia, itching, sneezing, nasal congestion or excess/ purulent secretions, fever, chills, sweats, unintended wt loss, pleuritic or exertional cp, hempoptysis, orthopnea pnd or change in chronic leg swelling. Also denies presyncope, palpitations, heartburn, abdominal pain, nausea, vomiting, diarrhea or change in bowel or urinary habits, dysuria,hematuria, rash, arthralgias, visual complaints, headache, numbness weakness or ataxia.     Objective:   Physical Exam  Gen. Pleasant, obese, in no distress ENT - no lesions, no post nasal drip Neck: No JVD, no thyromegaly, no carotid bruits Lungs: no use of  accessory muscles, no dullness to percussion, decreased without rales or rhonchi  Cardiovascular: Rhythm regular, heart sounds  normal, no murmurs or gallops, no peripheral edema Musculoskeletal: No deformities, no cyanosis or clubbing , no tremors       Assessment & Plan:

## 2013-09-04 NOTE — Patient Instructions (Signed)
Call [phone] - to schedule mask fitting appt - take your machine with you I will change your bipap to auto settings & recheck download in 1 month

## 2013-09-04 NOTE — Assessment & Plan Note (Signed)
Call [phone] - to schedule mask fitting appt - take your machine with you I will change your bipap to auto settings & recheck download in 1 month  Weight loss encouraged, compliance with goal of at least 4-6 hrs every night is the expectation. Advised against medications with sedative side effects Cautioned against driving when sleepy - understanding that sleepiness will vary on a day to day basis

## 2013-09-05 ENCOUNTER — Encounter: Payer: Self-pay | Admitting: Cardiovascular Disease

## 2013-09-05 DIAGNOSIS — Z923 Personal history of irradiation: Secondary | ICD-10-CM | POA: Diagnosis not present

## 2013-09-05 DIAGNOSIS — L02219 Cutaneous abscess of trunk, unspecified: Secondary | ICD-10-CM | POA: Diagnosis not present

## 2013-09-05 DIAGNOSIS — L03319 Cellulitis of trunk, unspecified: Secondary | ICD-10-CM | POA: Diagnosis not present

## 2013-09-05 DIAGNOSIS — T8189XA Other complications of procedures, not elsewhere classified, initial encounter: Secondary | ICD-10-CM | POA: Diagnosis not present

## 2013-09-05 DIAGNOSIS — L589 Radiodermatitis, unspecified: Secondary | ICD-10-CM | POA: Diagnosis not present

## 2013-09-08 DIAGNOSIS — Z923 Personal history of irradiation: Secondary | ICD-10-CM | POA: Diagnosis not present

## 2013-09-08 DIAGNOSIS — T8189XA Other complications of procedures, not elsewhere classified, initial encounter: Secondary | ICD-10-CM | POA: Diagnosis not present

## 2013-09-08 DIAGNOSIS — L589 Radiodermatitis, unspecified: Secondary | ICD-10-CM | POA: Diagnosis not present

## 2013-09-08 DIAGNOSIS — L02219 Cutaneous abscess of trunk, unspecified: Secondary | ICD-10-CM | POA: Diagnosis not present

## 2013-09-09 DIAGNOSIS — Z923 Personal history of irradiation: Secondary | ICD-10-CM | POA: Diagnosis not present

## 2013-09-09 DIAGNOSIS — T8189XA Other complications of procedures, not elsewhere classified, initial encounter: Secondary | ICD-10-CM | POA: Diagnosis not present

## 2013-09-09 DIAGNOSIS — L589 Radiodermatitis, unspecified: Secondary | ICD-10-CM | POA: Diagnosis not present

## 2013-09-09 DIAGNOSIS — L02219 Cutaneous abscess of trunk, unspecified: Secondary | ICD-10-CM | POA: Diagnosis not present

## 2013-09-10 ENCOUNTER — Ambulatory Visit (HOSPITAL_COMMUNITY): Payer: Medicare Other | Attending: Cardiovascular Disease | Admitting: Radiology

## 2013-09-10 DIAGNOSIS — I2510 Atherosclerotic heart disease of native coronary artery without angina pectoris: Secondary | ICD-10-CM | POA: Diagnosis not present

## 2013-09-10 DIAGNOSIS — I10 Essential (primary) hypertension: Secondary | ICD-10-CM | POA: Diagnosis not present

## 2013-09-10 DIAGNOSIS — I5032 Chronic diastolic (congestive) heart failure: Secondary | ICD-10-CM

## 2013-09-10 DIAGNOSIS — R0609 Other forms of dyspnea: Secondary | ICD-10-CM | POA: Insufficient documentation

## 2013-09-10 DIAGNOSIS — I059 Rheumatic mitral valve disease, unspecified: Secondary | ICD-10-CM | POA: Diagnosis not present

## 2013-09-10 DIAGNOSIS — L02219 Cutaneous abscess of trunk, unspecified: Secondary | ICD-10-CM | POA: Diagnosis not present

## 2013-09-10 DIAGNOSIS — L589 Radiodermatitis, unspecified: Secondary | ICD-10-CM | POA: Diagnosis not present

## 2013-09-10 DIAGNOSIS — I2589 Other forms of chronic ischemic heart disease: Secondary | ICD-10-CM | POA: Diagnosis present

## 2013-09-10 DIAGNOSIS — E785 Hyperlipidemia, unspecified: Secondary | ICD-10-CM | POA: Insufficient documentation

## 2013-09-10 DIAGNOSIS — T8189XA Other complications of procedures, not elsewhere classified, initial encounter: Secondary | ICD-10-CM | POA: Diagnosis not present

## 2013-09-10 DIAGNOSIS — R0989 Other specified symptoms and signs involving the circulatory and respiratory systems: Secondary | ICD-10-CM | POA: Insufficient documentation

## 2013-09-10 DIAGNOSIS — I4891 Unspecified atrial fibrillation: Secondary | ICD-10-CM | POA: Insufficient documentation

## 2013-09-10 DIAGNOSIS — I252 Old myocardial infarction: Secondary | ICD-10-CM | POA: Insufficient documentation

## 2013-09-10 DIAGNOSIS — Z923 Personal history of irradiation: Secondary | ICD-10-CM | POA: Diagnosis not present

## 2013-09-10 DIAGNOSIS — L03319 Cellulitis of trunk, unspecified: Secondary | ICD-10-CM | POA: Diagnosis not present

## 2013-09-10 NOTE — Progress Notes (Signed)
Echocardiogram performed.  

## 2013-09-11 DIAGNOSIS — L02219 Cutaneous abscess of trunk, unspecified: Secondary | ICD-10-CM | POA: Diagnosis not present

## 2013-09-11 DIAGNOSIS — Z923 Personal history of irradiation: Secondary | ICD-10-CM | POA: Diagnosis not present

## 2013-09-11 DIAGNOSIS — L03319 Cellulitis of trunk, unspecified: Secondary | ICD-10-CM | POA: Diagnosis not present

## 2013-09-11 DIAGNOSIS — L589 Radiodermatitis, unspecified: Secondary | ICD-10-CM | POA: Diagnosis not present

## 2013-09-11 DIAGNOSIS — T8189XA Other complications of procedures, not elsewhere classified, initial encounter: Secondary | ICD-10-CM | POA: Diagnosis not present

## 2013-09-12 ENCOUNTER — Ambulatory Visit: Payer: Medicare Other | Admitting: Cardiology

## 2013-09-12 DIAGNOSIS — Z923 Personal history of irradiation: Secondary | ICD-10-CM | POA: Diagnosis not present

## 2013-09-12 DIAGNOSIS — L589 Radiodermatitis, unspecified: Secondary | ICD-10-CM | POA: Diagnosis not present

## 2013-09-12 DIAGNOSIS — L02219 Cutaneous abscess of trunk, unspecified: Secondary | ICD-10-CM | POA: Diagnosis not present

## 2013-09-12 DIAGNOSIS — T8189XA Other complications of procedures, not elsewhere classified, initial encounter: Secondary | ICD-10-CM | POA: Diagnosis not present

## 2013-09-15 DIAGNOSIS — Z923 Personal history of irradiation: Secondary | ICD-10-CM | POA: Diagnosis not present

## 2013-09-15 DIAGNOSIS — T8189XA Other complications of procedures, not elsewhere classified, initial encounter: Secondary | ICD-10-CM | POA: Diagnosis not present

## 2013-09-15 DIAGNOSIS — L02219 Cutaneous abscess of trunk, unspecified: Secondary | ICD-10-CM | POA: Diagnosis not present

## 2013-09-15 DIAGNOSIS — L589 Radiodermatitis, unspecified: Secondary | ICD-10-CM | POA: Diagnosis not present

## 2013-09-16 ENCOUNTER — Encounter (HOSPITAL_BASED_OUTPATIENT_CLINIC_OR_DEPARTMENT_OTHER): Payer: Medicare Other | Attending: General Surgery

## 2013-09-16 ENCOUNTER — Encounter: Payer: Self-pay | Admitting: Cardiovascular Disease

## 2013-09-16 DIAGNOSIS — T66XXXA Radiation sickness, unspecified, initial encounter: Secondary | ICD-10-CM | POA: Diagnosis present

## 2013-09-16 DIAGNOSIS — I83219 Varicose veins of right lower extremity with both ulcer of unspecified site and inflammation: Secondary | ICD-10-CM | POA: Insufficient documentation

## 2013-09-16 DIAGNOSIS — I83229 Varicose veins of left lower extremity with both ulcer of unspecified site and inflammation: Secondary | ICD-10-CM

## 2013-09-16 DIAGNOSIS — L97929 Non-pressure chronic ulcer of unspecified part of left lower leg with unspecified severity: Secondary | ICD-10-CM | POA: Diagnosis not present

## 2013-09-16 DIAGNOSIS — Y842 Radiological procedure and radiotherapy as the cause of abnormal reaction of the patient, or of later complication, without mention of misadventure at the time of the procedure: Secondary | ICD-10-CM | POA: Diagnosis not present

## 2013-09-16 DIAGNOSIS — T8189XA Other complications of procedures, not elsewhere classified, initial encounter: Secondary | ICD-10-CM | POA: Diagnosis not present

## 2013-09-16 DIAGNOSIS — L97919 Non-pressure chronic ulcer of unspecified part of right lower leg with unspecified severity: Secondary | ICD-10-CM | POA: Insufficient documentation

## 2013-09-16 DIAGNOSIS — L97509 Non-pressure chronic ulcer of other part of unspecified foot with unspecified severity: Secondary | ICD-10-CM | POA: Diagnosis not present

## 2013-09-16 NOTE — Telephone Encounter (Signed)
New message ° ° ° ° ° °Returning Lauren's call °

## 2013-09-16 NOTE — Telephone Encounter (Signed)
This encounter was created in error - please disregard.

## 2013-09-21 ENCOUNTER — Other Ambulatory Visit: Payer: Self-pay | Admitting: Cardiovascular Disease

## 2013-09-23 DIAGNOSIS — I83219 Varicose veins of right lower extremity with both ulcer of unspecified site and inflammation: Secondary | ICD-10-CM | POA: Diagnosis not present

## 2013-09-23 DIAGNOSIS — T8189XA Other complications of procedures, not elsewhere classified, initial encounter: Secondary | ICD-10-CM | POA: Diagnosis not present

## 2013-09-23 DIAGNOSIS — L97929 Non-pressure chronic ulcer of unspecified part of left lower leg with unspecified severity: Secondary | ICD-10-CM | POA: Diagnosis not present

## 2013-09-23 DIAGNOSIS — T66XXXA Radiation sickness, unspecified, initial encounter: Secondary | ICD-10-CM | POA: Diagnosis not present

## 2013-09-24 DIAGNOSIS — T66XXXA Radiation sickness, unspecified, initial encounter: Secondary | ICD-10-CM | POA: Diagnosis not present

## 2013-09-24 DIAGNOSIS — T8189XA Other complications of procedures, not elsewhere classified, initial encounter: Secondary | ICD-10-CM | POA: Diagnosis not present

## 2013-09-24 DIAGNOSIS — I83219 Varicose veins of right lower extremity with both ulcer of unspecified site and inflammation: Secondary | ICD-10-CM | POA: Diagnosis not present

## 2013-09-25 DIAGNOSIS — T8189XA Other complications of procedures, not elsewhere classified, initial encounter: Secondary | ICD-10-CM | POA: Diagnosis not present

## 2013-09-25 DIAGNOSIS — I83219 Varicose veins of right lower extremity with both ulcer of unspecified site and inflammation: Secondary | ICD-10-CM | POA: Diagnosis not present

## 2013-09-25 DIAGNOSIS — T66XXXA Radiation sickness, unspecified, initial encounter: Secondary | ICD-10-CM | POA: Diagnosis not present

## 2013-09-25 DIAGNOSIS — L97919 Non-pressure chronic ulcer of unspecified part of right lower leg with unspecified severity: Secondary | ICD-10-CM | POA: Diagnosis not present

## 2013-09-26 DIAGNOSIS — I83219 Varicose veins of right lower extremity with both ulcer of unspecified site and inflammation: Secondary | ICD-10-CM | POA: Diagnosis not present

## 2013-09-26 DIAGNOSIS — T66XXXA Radiation sickness, unspecified, initial encounter: Secondary | ICD-10-CM | POA: Diagnosis not present

## 2013-09-26 DIAGNOSIS — T8189XA Other complications of procedures, not elsewhere classified, initial encounter: Secondary | ICD-10-CM | POA: Diagnosis not present

## 2013-09-29 DIAGNOSIS — T8189XA Other complications of procedures, not elsewhere classified, initial encounter: Secondary | ICD-10-CM | POA: Diagnosis not present

## 2013-09-29 DIAGNOSIS — I83219 Varicose veins of right lower extremity with both ulcer of unspecified site and inflammation: Secondary | ICD-10-CM | POA: Diagnosis not present

## 2013-09-29 DIAGNOSIS — T66XXXA Radiation sickness, unspecified, initial encounter: Secondary | ICD-10-CM | POA: Diagnosis not present

## 2013-09-29 DIAGNOSIS — L97929 Non-pressure chronic ulcer of unspecified part of left lower leg with unspecified severity: Secondary | ICD-10-CM | POA: Diagnosis not present

## 2013-09-30 ENCOUNTER — Telehealth: Payer: Self-pay | Admitting: Pulmonary Disease

## 2013-09-30 DIAGNOSIS — I83219 Varicose veins of right lower extremity with both ulcer of unspecified site and inflammation: Secondary | ICD-10-CM | POA: Diagnosis not present

## 2013-09-30 DIAGNOSIS — T66XXXA Radiation sickness, unspecified, initial encounter: Secondary | ICD-10-CM | POA: Diagnosis not present

## 2013-09-30 DIAGNOSIS — T8189XA Other complications of procedures, not elsewhere classified, initial encounter: Secondary | ICD-10-CM | POA: Diagnosis not present

## 2013-10-01 DIAGNOSIS — T66XXXA Radiation sickness, unspecified, initial encounter: Secondary | ICD-10-CM | POA: Diagnosis not present

## 2013-10-01 DIAGNOSIS — T8189XA Other complications of procedures, not elsewhere classified, initial encounter: Secondary | ICD-10-CM | POA: Diagnosis not present

## 2013-10-01 DIAGNOSIS — I83219 Varicose veins of right lower extremity with both ulcer of unspecified site and inflammation: Secondary | ICD-10-CM | POA: Diagnosis not present

## 2013-10-01 DIAGNOSIS — L97919 Non-pressure chronic ulcer of unspecified part of right lower leg with unspecified severity: Secondary | ICD-10-CM | POA: Diagnosis not present

## 2013-10-01 NOTE — Telephone Encounter (Signed)
LMTCB for Arbie Cookey at [facility]

## 2013-10-01 NOTE — Telephone Encounter (Signed)
Called spoke with Arbie Cookey. She reports pt had an appt with them today and was r/s to 9/21. They are not showing anything is needed  Called pt LMTCB x1

## 2013-10-01 NOTE — Telephone Encounter (Signed)
Arbie Cookey returning call can be reached @ [phone].Hillery Hunter

## 2013-10-02 NOTE — Telephone Encounter (Signed)
Spoke with the pt and notified of recs from [place]  Nothing further needed

## 2013-10-02 NOTE — Telephone Encounter (Signed)
lmomtcb x 2  

## 2013-10-03 DIAGNOSIS — T66XXXA Radiation sickness, unspecified, initial encounter: Secondary | ICD-10-CM | POA: Diagnosis not present

## 2013-10-03 DIAGNOSIS — T8189XA Other complications of procedures, not elsewhere classified, initial encounter: Secondary | ICD-10-CM | POA: Diagnosis not present

## 2013-10-08 ENCOUNTER — Telehealth: Payer: Self-pay | Admitting: Pulmonary Disease

## 2013-10-08 DIAGNOSIS — G4733 Obstructive sleep apnea (adult) (pediatric): Secondary | ICD-10-CM

## 2013-10-08 NOTE — Telephone Encounter (Signed)
Called spoke with pt. He reports he has tried to reach [facility] about his bipap. This is AM pt used the bipap x 8 hrs a night. This AM it stated he had 17.8 events/hr. Pt is concerned. Please advise RA thanks  Per [facility] download 8/24-9/22:  Usage days 30/30 days (100%) >= 4 hours 21 days (70%) < 4 hours 9 days (30%) Usage hours 160 hours 12 minutes Average usage (total days) 5 hours 20 minutes Average usage (days used) 5 hours 20 minutes Median usage (days used) 6 hours 2 minutes ---- Leaks - L/min Median: 19.1 95th percentile: 38.1 Maximum: 55.0 Events per hour AI: 7.9 HI: 0.4 AHI: 8.3 Apnea Index Central: 4.6 Obstructive: 1.4 Unknown: 1.7 --- Mode VAuto Max IPAP 20 cmH2O Min EPAP 12 cmH2O Pressure Support 4 cmH2O

## 2013-10-09 NOTE — Telephone Encounter (Signed)
He needs higher pressure per his study but I wanted him to get comfortable first on bipap Change Bipap to 20/16 cm, ramp x 20 mins , get download x 2 weeks

## 2013-10-09 NOTE — Telephone Encounter (Signed)
Pt aware of recs. Order placed. Nothing further needed 

## 2013-10-10 DIAGNOSIS — I83219 Varicose veins of right lower extremity with both ulcer of unspecified site and inflammation: Secondary | ICD-10-CM | POA: Diagnosis not present

## 2013-10-10 DIAGNOSIS — T66XXXA Radiation sickness, unspecified, initial encounter: Secondary | ICD-10-CM | POA: Diagnosis not present

## 2013-10-10 DIAGNOSIS — T8189XA Other complications of procedures, not elsewhere classified, initial encounter: Secondary | ICD-10-CM | POA: Diagnosis not present

## 2013-10-14 ENCOUNTER — Encounter: Payer: Self-pay | Admitting: Gastroenterology

## 2013-10-17 ENCOUNTER — Encounter (HOSPITAL_BASED_OUTPATIENT_CLINIC_OR_DEPARTMENT_OTHER): Payer: Medicare Other | Attending: General Surgery

## 2013-10-17 DIAGNOSIS — I87331 Chronic venous hypertension (idiopathic) with ulcer and inflammation of right lower extremity: Secondary | ICD-10-CM | POA: Insufficient documentation

## 2013-10-17 DIAGNOSIS — L97519 Non-pressure chronic ulcer of other part of right foot with unspecified severity: Secondary | ICD-10-CM | POA: Diagnosis not present

## 2013-10-17 DIAGNOSIS — L02212 Cutaneous abscess of back [any part, except buttock]: Secondary | ICD-10-CM | POA: Diagnosis not present

## 2013-10-24 DIAGNOSIS — I87331 Chronic venous hypertension (idiopathic) with ulcer and inflammation of right lower extremity: Secondary | ICD-10-CM | POA: Diagnosis not present

## 2013-10-31 DIAGNOSIS — I87331 Chronic venous hypertension (idiopathic) with ulcer and inflammation of right lower extremity: Secondary | ICD-10-CM | POA: Diagnosis not present

## 2013-10-31 DIAGNOSIS — L97519 Non-pressure chronic ulcer of other part of right foot with unspecified severity: Secondary | ICD-10-CM | POA: Diagnosis not present

## 2013-10-31 DIAGNOSIS — L02212 Cutaneous abscess of back [any part, except buttock]: Secondary | ICD-10-CM | POA: Diagnosis not present

## 2013-11-04 ENCOUNTER — Ambulatory Visit (INDEPENDENT_AMBULATORY_CARE_PROVIDER_SITE_OTHER): Payer: Medicare Other | Admitting: Pulmonary Disease

## 2013-11-04 ENCOUNTER — Encounter: Payer: Self-pay | Admitting: Pulmonary Disease

## 2013-11-04 VITALS — BP 118/64 | HR 98 | Temp 96.9°F | Ht 66.5 in | Wt 224.0 lb

## 2013-11-04 DIAGNOSIS — G4733 Obstructive sleep apnea (adult) (pediatric): Secondary | ICD-10-CM

## 2013-11-04 DIAGNOSIS — I2510 Atherosclerotic heart disease of native coronary artery without angina pectoris: Secondary | ICD-10-CM

## 2013-11-04 NOTE — Patient Instructions (Signed)
We will lower pressure to 18/14 & recheck a download in 1 month on these settings Call us to let us know about leakage on these settings OK to decrease humidity to 4 We will send note to [facility]

## 2013-11-04 NOTE — Progress Notes (Signed)
   Subjective:    Patient ID: Harold Jennings, male    DOB: [birth year], 80 y.o.   MRN: [id number]  HPI  80 year old retired [employer] with coronary artery disease, spinal stenosis and hypertension presents for FU of obstructive sleep apnea.   Significant tests/ events  PSG in [place], [place] in 1991 and was maintained on nasal CPAP since.  CXR - Stable nodular density in the right upper lobe since 2010  Home study showed severe OSA + central apneas  Titration 06/2013 -235 pounds with a height of 5 ft 5 inches and the BMI of 39, neck size of 18.5 inches mixed and central apneas persisted on CPAP. Hence BiPAP was required  Events corrected by BiPAP at 20/16 centimeters with a medium fullface mask - target 22/18   09/04/2013 Download shows-good compliance, average more than 6 hours, no residual events, AHI 4 per hour, small leak   11/04/2013 Chief Complaint  Patient presents with  . Sleep Apnea    CPAP 20CM; Pt states machine is leaking; Pt using every night approx 6 hours nightly    Changed bipap to auto  Per [employer] 8/24-9/22/15:  Usage days 30/30 days (100%)  >= 4 hours 21 days (70%)  < 4 hours 9 days (30%)  ----  Leaks - L/min Median: 19.1 95th percentile: 38.1 Maximum: 55.0  Events per hour AI: 7.9 HI: 0.4 AHI: 8.3  Apnea Index Central: 4.6 Obstructive: 1.4 Unknown: 1.7  ---  Mode VAuto  Max IPAP 20 cmH2O  Min EPAP 12 cmH2O  Pressure Support 4 cmH2O   Changed Bipap to 20/16 cm, ramp x 20 mins  Download 10/18 15 shows no residuals, good usage > 6h, but leak ++  This wakes him up & he feels he can do better bipap is clearly helping him & he is hugely improved He wonders if the pressure can be decreased.    Review of Systems neg for any significant sore throat, dysphagia, itching, sneezing, nasal congestion or excess/ purulent secretions, fever, chills, sweats, unintended wt loss, pleuritic or exertional cp, hempoptysis, orthopnea pnd or change in chronic leg  swelling. Also denies presyncope, palpitations, heartburn, abdominal pain, nausea, vomiting, diarrhea or change in bowel or urinary habits, dysuria,hematuria, rash, arthralgias, visual complaints, headache, numbness weakness or ataxia.     Objective:   Physical Exam  Gen. Pleasant, obese, in no distress ENT - no lesions, no post nasal drip Neck: No JVD, no thyromegaly, no carotid bruits Lungs: no use of accessory muscles, no dullness to percussion, decreased without rales or rhonchi  Cardiovascular: Rhythm regular, heart sounds  normal, no murmurs or gallops, no peripheral edema Musculoskeletal: No deformities, no cyanosis or clubbing , no tremors       Assessment & Plan:

## 2013-11-05 NOTE — Assessment & Plan Note (Signed)
Will trade optimal pressure for comfort We will lower pressure to 18/14 & recheck a download in 1 month on these settings Call us to let us know about leakage on these settings OK to decrease humidity to 4 We will send note to [facility] giving him a hard time about face to face visit evn though this has been documented -bipap definitely helping him tremendously

## 2013-11-07 DIAGNOSIS — I87331 Chronic venous hypertension (idiopathic) with ulcer and inflammation of right lower extremity: Secondary | ICD-10-CM | POA: Diagnosis not present

## 2013-11-07 DIAGNOSIS — L02212 Cutaneous abscess of back [any part, except buttock]: Secondary | ICD-10-CM | POA: Diagnosis not present

## 2013-11-07 DIAGNOSIS — L97519 Non-pressure chronic ulcer of other part of right foot with unspecified severity: Secondary | ICD-10-CM | POA: Diagnosis not present

## 2013-11-14 DIAGNOSIS — L02212 Cutaneous abscess of back [any part, except buttock]: Secondary | ICD-10-CM | POA: Diagnosis not present

## 2013-11-14 DIAGNOSIS — I87331 Chronic venous hypertension (idiopathic) with ulcer and inflammation of right lower extremity: Secondary | ICD-10-CM | POA: Diagnosis not present

## 2013-11-14 DIAGNOSIS — L97519 Non-pressure chronic ulcer of other part of right foot with unspecified severity: Secondary | ICD-10-CM | POA: Diagnosis not present

## 2013-11-21 ENCOUNTER — Encounter (HOSPITAL_BASED_OUTPATIENT_CLINIC_OR_DEPARTMENT_OTHER): Payer: Medicare Other | Attending: General Surgery

## 2013-11-21 DIAGNOSIS — Y842 Radiological procedure and radiotherapy as the cause of abnormal reaction of the patient, or of later complication, without mention of misadventure at the time of the procedure: Secondary | ICD-10-CM | POA: Insufficient documentation

## 2013-11-21 DIAGNOSIS — I87329 Chronic venous hypertension (idiopathic) with inflammation of unspecified lower extremity: Secondary | ICD-10-CM | POA: Insufficient documentation

## 2013-11-21 DIAGNOSIS — T66XXXD Radiation sickness, unspecified, subsequent encounter: Secondary | ICD-10-CM | POA: Insufficient documentation

## 2013-11-21 DIAGNOSIS — L97519 Non-pressure chronic ulcer of other part of right foot with unspecified severity: Secondary | ICD-10-CM | POA: Insufficient documentation

## 2013-11-28 DIAGNOSIS — T66XXXD Radiation sickness, unspecified, subsequent encounter: Secondary | ICD-10-CM | POA: Diagnosis not present

## 2013-11-28 DIAGNOSIS — L97519 Non-pressure chronic ulcer of other part of right foot with unspecified severity: Secondary | ICD-10-CM | POA: Diagnosis not present

## 2013-11-28 DIAGNOSIS — I87329 Chronic venous hypertension (idiopathic) with inflammation of unspecified lower extremity: Secondary | ICD-10-CM | POA: Diagnosis not present

## 2013-12-09 ENCOUNTER — Ambulatory Visit (INDEPENDENT_AMBULATORY_CARE_PROVIDER_SITE_OTHER): Payer: Medicare Other | Admitting: Cardiovascular Disease

## 2013-12-09 ENCOUNTER — Encounter: Payer: Self-pay | Admitting: Cardiovascular Disease

## 2013-12-09 ENCOUNTER — Ambulatory Visit: Payer: Medicare Other | Admitting: Cardiovascular Disease

## 2013-12-09 VITALS — BP 122/84 | HR 88 | Ht 66.5 in | Wt 227.8 lb

## 2013-12-09 DIAGNOSIS — I482 Chronic atrial fibrillation, unspecified: Secondary | ICD-10-CM

## 2013-12-09 DIAGNOSIS — I2510 Atherosclerotic heart disease of native coronary artery without angina pectoris: Secondary | ICD-10-CM

## 2013-12-09 NOTE — Progress Notes (Signed)
Background: The patient is followed for chronic atrial fibrillation maintained on anticoagulation with Xarelto and rate control with Metoprolol succinate. Other problems include CAD with known total occlusion of the RCA and mild segmental LV dysfunction, obesity, and Merkel cell carcinoma.   HPI:  80 year old gentleman presenting for follow-up evaluation. When he was last evaluated in August 2015, his heart rate was elevated and medication adjustments were made. He is now back to his previous dose of metoprolol succinate.   He complains of generalized fatigue, but otherwise has no specific cardiac related complaints. He denies heart palpitations, chest pain, he does complain of chronic leg swelling.  Studies:  2-D echocardiogram 09/10/2013: Study Conclusions  - Left ventricle: The cavity size was mildly dilated. There was mild concentric hypertrophy. Systolic function was mildly to moderately reduced. The estimated ejection fraction was in the range of 40% to 45%. Wall motion was normal; there were no regional wall motion abnormalities. The study was not technically sufficient to allow evaluation of LV diastolic dysfunction due to atrial fibrillation. - Aortic valve: Trileaflet; moderately thickened, moderately calcified leaflets. Cusp separation was mildly reduced. There was mild stenosis. There was mild regurgitation. - Aortic root: The aortic root was normal in size. - Mitral valve: There was mild to moderate regurgitation directed centrally. - Left atrium: The atrium was moderately to severely dilated. - Right ventricle: The cavity size was mildly dilated. Wall thickness was normal. Systolic function was normal. - Right atrium: The atrium was mildly dilated. - Tricuspid valve: There was mild regurgitation. - Pulmonic valve: There was mild regurgitation. - Pulmonary arteries: Systolic pressure was within the normal range. PA peak pressure: 31 mm Hg (S). -  Pericardium, extracardiac: There was no pericardial effusion.  Impressions:  - Compared to the prior study in 2013 the left ventricle is now mildly dilated with mildly impaired systolic function, LVEF 23-53%. Mild to moderate mitral regurgitation. Severel lefta trial dilatation. Mild aortic stenosis and insufficiency. Normal RVSP.   Outpatient Encounter Prescriptions as of 12/09/2013  Medication Sig  . clobetasol cream (TEMOVATE) 0.05 % As directed  . diphenhydrAMINE (BENADRYL) 25 mg capsule Take 25 mg by mouth at bedtime as needed.  . doxazosin (CARDURA) 8 MG tablet Take 8 mg by mouth at bedtime.  Marland Kitchen ezetimibe (ZETIA) 10 MG tablet Take 10 mg by mouth daily.  . furosemide (LASIX) 40 MG tablet Take 40 mg by mouth as needed.  . gabapentin (NEURONTIN) 600 MG tablet Take 1/2 tab in the am ans 1/2 - to 1 whole tab in the evening  . levothyroxine (SYNTHROID, LEVOTHROID) 100 MCG tablet Take 100 mcg by mouth daily.  Marland Kitchen lisinopril (PRINIVIL,ZESTRIL) 10 MG tablet Take 1 (10 mg) tablet daily  . metoprolol succinate (TOPROL-XL) 50 MG 24 hr tablet Take one tablet by mouth every morning, take one-half tablet every evening. Take with or immediately following a meal.  . Multiple Vitamin (MULTIVITAMIN) tablet Take 1 tablet by mouth daily.  Marland Kitchen tetracycline (ACHROMYCIN,SUMYCIN) 250 MG capsule Take 250 mg by mouth daily.  Alveda Reasons 20 MG TABS tablet TAKE 1 TABLET BY MOUTH DAILY WITH SUPPER.  . [DISCONTINUED] doxycycline (VIBRA-TABS) 100 MG tablet Take 100 mg by mouth 2 (two) times daily.   . [DISCONTINUED] levothyroxine (SYNTHROID, LEVOTHROID) 88 MCG tablet Take 88 mcg by mouth daily before breakfast.  . [DISCONTINUED] omeprazole (PRILOSEC) 20 MG capsule 1 tab daily    No Known Allergies  Past Medical History  Diagnosis Date  . Dyslipidemia   . HTN (  hypertension)   . Obesity   . CAD (coronary artery disease)     Silent MI sometime prior to 2000. S/P cath in 2000 showing a total mid RCA  with collaterals from the LCX. EF is 40 to50%; Last Myoview in 2010 showing inferior scar without ischemia. EF was 52%.  . Increased liver enzymes     felt to be a fatty liver per ultrasound in 2011  . Dyspnea   . Ischemic cardiomyopathy   . Spinal stenosis     family history includes Diabetes in his father; Drug abuse in his mother; Heart attack in his father; Hypertension in his brother, brother, brother, father, and sister; Stroke in his father.   ROS: Negative except as per HPI  BP 122/84 mmHg  Pulse 88  Ht 5' 6.5" (1.689 m)  Wt 227 lb 12.8 oz (103.329 kg)  BMI 36.22 kg/m2  PHYSICAL EXAM: Pt is alert and oriented, pleasant obese male in NAD HEENT: normal Neck: JVP - normal, carotids 2+= without bruits Lungs: CTA bilaterally CV: Irregularly irregular without murmur or gallop Abd: soft, NT, Positive BS, no hepatomegaly Ext: 2+ pretibial edema bilaterally, distal pulses intact and equal Skin: warm/dry no rash  ASSESSMENT AND PLAN: 1. Atrial fibrillation, chronic. The patient is tolerating anticoagulation with Xarelto without bleeding problems. His heart rate is controlled on his current dose of metoprolol succinate. I will see him back for follow-up evaluation in 6 months.  2. Essential hypertension: Blood pressure is controlled on a combination of lisinopril and metoprolol succinate.  3. Coronary artery disease, native vessel, without symptoms of angina.  4. Leg swelling: Likely related to obesity and stasis/venous insufficiency. Reassurance provided. Compression stockings and leg elevation would be appropriate conservative measures.  Sherren Mocha, MD 12/09/2013 11:22 AM

## 2013-12-09 NOTE — Patient Instructions (Signed)
Your physician wants you to follow-up in: 6 MONTHS with Dr Cooper.  You will receive a reminder letter in the mail two months in advance. If you don't receive a letter, please call our office to schedule the follow-up appointment.  Your physician recommends that you continue on your current medications as directed. Please refer to the Current Medication list given to you today.  

## 2013-12-16 DIAGNOSIS — K5790 Diverticulosis of intestine, part unspecified, without perforation or abscess without bleeding: Secondary | ICD-10-CM

## 2013-12-16 HISTORY — DX: Diverticulosis of intestine, part unspecified, without perforation or abscess without bleeding: K57.90

## 2013-12-19 ENCOUNTER — Encounter (HOSPITAL_BASED_OUTPATIENT_CLINIC_OR_DEPARTMENT_OTHER): Payer: Medicare Other | Attending: Internal Medicine

## 2013-12-19 DIAGNOSIS — L97519 Non-pressure chronic ulcer of other part of right foot with unspecified severity: Secondary | ICD-10-CM | POA: Insufficient documentation

## 2013-12-19 DIAGNOSIS — L98429 Non-pressure chronic ulcer of back with unspecified severity: Secondary | ICD-10-CM | POA: Insufficient documentation

## 2013-12-21 ENCOUNTER — Other Ambulatory Visit: Payer: Self-pay | Admitting: Cardiovascular Disease

## 2013-12-29 ENCOUNTER — Other Ambulatory Visit: Payer: Self-pay | Admitting: Cardiovascular Disease

## 2014-01-01 ENCOUNTER — Telehealth: Payer: Self-pay | Admitting: Cardiovascular Disease

## 2014-01-01 NOTE — Telephone Encounter (Signed)
Dosage for  Metoprolol succinate 50 mg in am and 25 mg in pm last office visit medication list given to [facility] for pt. For refill.

## 2014-01-01 NOTE — Telephone Encounter (Signed)
New Msg    [facility] trying to fill prescription of [facility], they need to clarify the right dosage.    Please call [phone].

## 2014-01-02 DIAGNOSIS — L97519 Non-pressure chronic ulcer of other part of right foot with unspecified severity: Secondary | ICD-10-CM | POA: Diagnosis not present

## 2014-01-02 DIAGNOSIS — L98429 Non-pressure chronic ulcer of back with unspecified severity: Secondary | ICD-10-CM | POA: Diagnosis not present

## 2014-01-14 ENCOUNTER — Other Ambulatory Visit: Payer: Self-pay | Admitting: Family Medicine

## 2014-01-14 ENCOUNTER — Encounter (HOSPITAL_COMMUNITY): Payer: Self-pay

## 2014-01-14 ENCOUNTER — Inpatient Hospital Stay (HOSPITAL_COMMUNITY)
Admission: EM | Admit: 2014-01-14 | Discharge: 2014-01-19 | DRG: 392 | Disposition: A | Discharge status: Home Health | Payer: Medicare Other | Attending: Internal Medicine | Admitting: Internal Medicine

## 2014-01-14 ENCOUNTER — Ambulatory Visit
Admission: RE | Admit: 2014-01-14 | Discharge: 2014-01-14 | Disposition: A | Discharge status: Home / Self Care | Payer: Medicare Other | Source: Ambulatory Visit | Attending: Family Medicine | Admitting: Family Medicine

## 2014-01-14 DIAGNOSIS — E780 Pure hypercholesterolemia: Secondary | ICD-10-CM | POA: Diagnosis present

## 2014-01-14 DIAGNOSIS — I255 Ischemic cardiomyopathy: Secondary | ICD-10-CM | POA: Diagnosis present

## 2014-01-14 DIAGNOSIS — E669 Obesity, unspecified: Secondary | ICD-10-CM | POA: Diagnosis present

## 2014-01-14 DIAGNOSIS — K420 Umbilical hernia with obstruction, without gangrene: Secondary | ICD-10-CM | POA: Diagnosis present

## 2014-01-14 DIAGNOSIS — I5022 Chronic systolic (congestive) heart failure: Secondary | ICD-10-CM | POA: Diagnosis present

## 2014-01-14 DIAGNOSIS — E785 Hyperlipidemia, unspecified: Secondary | ICD-10-CM | POA: Diagnosis present

## 2014-01-14 DIAGNOSIS — R319 Hematuria, unspecified: Secondary | ICD-10-CM

## 2014-01-14 DIAGNOSIS — I2510 Atherosclerotic heart disease of native coronary artery without angina pectoris: Secondary | ICD-10-CM | POA: Diagnosis present

## 2014-01-14 DIAGNOSIS — Z96642 Presence of left artificial hip joint: Secondary | ICD-10-CM | POA: Diagnosis present

## 2014-01-14 DIAGNOSIS — K5720 Diverticulitis of large intestine with perforation and abscess without bleeding: Secondary | ICD-10-CM | POA: Diagnosis not present

## 2014-01-14 DIAGNOSIS — K651 Peritoneal abscess: Secondary | ICD-10-CM

## 2014-01-14 DIAGNOSIS — E039 Hypothyroidism, unspecified: Secondary | ICD-10-CM | POA: Diagnosis present

## 2014-01-14 DIAGNOSIS — G4733 Obstructive sleep apnea (adult) (pediatric): Secondary | ICD-10-CM | POA: Diagnosis present

## 2014-01-14 DIAGNOSIS — I10 Essential (primary) hypertension: Secondary | ICD-10-CM | POA: Diagnosis present

## 2014-01-14 DIAGNOSIS — R310 Gross hematuria: Secondary | ICD-10-CM | POA: Diagnosis present

## 2014-01-14 DIAGNOSIS — Z6834 Body mass index (BMI) 34.0-34.9, adult: Secondary | ICD-10-CM

## 2014-01-14 DIAGNOSIS — R109 Unspecified abdominal pain: Secondary | ICD-10-CM | POA: Diagnosis present

## 2014-01-14 DIAGNOSIS — Z79899 Other long term (current) drug therapy: Secondary | ICD-10-CM | POA: Diagnosis not present

## 2014-01-14 DIAGNOSIS — K5732 Diverticulitis of large intestine without perforation or abscess without bleeding: Secondary | ICD-10-CM

## 2014-01-14 DIAGNOSIS — Z7901 Long term (current) use of anticoagulants: Secondary | ICD-10-CM

## 2014-01-14 DIAGNOSIS — I5042 Chronic combined systolic (congestive) and diastolic (congestive) heart failure: Secondary | ICD-10-CM | POA: Diagnosis present

## 2014-01-14 DIAGNOSIS — I482 Chronic atrial fibrillation, unspecified: Secondary | ICD-10-CM | POA: Diagnosis present

## 2014-01-14 LAB — COMPREHENSIVE METABOLIC PANEL
ALT: 25 U/L (ref 0–53)
AST: 31 U/L (ref 0–37)
Albumin: 3.7 g/dL (ref 3.5–5.2)
Alkaline Phosphatase: 148 U/L — ABNORMAL HIGH (ref 39–117)
Anion gap: 9 (ref 5–15)
BILIRUBIN TOTAL: 1.1 mg/dL (ref 0.3–1.2)
BUN: 16 mg/dL (ref 6–23)
CHLORIDE: 102 meq/L (ref 96–112)
CO2: 23 mmol/L (ref 19–32)
Calcium: 8.9 mg/dL (ref 8.4–10.5)
Creatinine, Ser: 1.1 mg/dL (ref 0.50–1.35)
GFR calc Af Amer: 72 mL/min — ABNORMAL LOW (ref >90)
GFR, EST NON AFRICAN AMERICAN: 62 mL/min — AB (ref >90)
GLUCOSE: 102 mg/dL — AB (ref 70–99)
POTASSIUM: 3.9 mmol/L (ref 3.5–5.1)
Sodium: 134 mmol/L — ABNORMAL LOW (ref 135–145)
Total Protein: 7 g/dL (ref 6.0–8.3)

## 2014-01-14 LAB — CBC WITH DIFFERENTIAL/PLATELET
Basophils Absolute: 0 10*3/uL (ref 0.0–0.1)
Basophils Relative: 0 % (ref 0–1)
EOS PCT: 4 % (ref 0–5)
Eosinophils Absolute: 0.3 10*3/uL (ref 0.0–0.7)
HCT: 40.2 % (ref 39.0–52.0)
Hemoglobin: 13.9 g/dL (ref 13.0–17.0)
LYMPHS ABS: 1.3 10*3/uL (ref 0.7–4.0)
Lymphocytes Relative: 18 % (ref 12–46)
MCH: 33.8 pg (ref 26.0–34.0)
MCHC: 34.6 g/dL (ref 30.0–36.0)
MCV: 97.8 fL (ref 78.0–100.0)
MONO ABS: 0.8 10*3/uL (ref 0.1–1.0)
Monocytes Relative: 11 % (ref 3–12)
NEUTROS ABS: 4.8 10*3/uL (ref 1.7–7.7)
NEUTROS PCT: 67 % (ref 43–77)
Platelets: 168 10*3/uL (ref 150–400)
RBC: 4.11 MIL/uL — ABNORMAL LOW (ref 4.22–5.81)
RDW: 14.8 % (ref 11.5–15.5)
WBC: 7.2 10*3/uL (ref 4.0–10.5)

## 2014-01-14 LAB — URINALYSIS, ROUTINE W REFLEX MICROSCOPIC
Bilirubin Urine: NEGATIVE
Glucose, UA: NEGATIVE mg/dL
KETONES UR: NEGATIVE mg/dL
Leukocytes, UA: NEGATIVE
Nitrite: NEGATIVE
PH: 5.5 (ref 5.0–8.0)
Protein, ur: NEGATIVE mg/dL
Specific Gravity, Urine: 1.028 (ref 1.005–1.030)
Urobilinogen, UA: 0.2 mg/dL (ref 0.0–1.0)

## 2014-01-14 LAB — PROTIME-INR
INR: 1.43 (ref 0.00–1.49)
Prothrombin Time: 17.6 seconds — ABNORMAL HIGH (ref 11.6–15.2)

## 2014-01-14 LAB — GLUCOSE, CAPILLARY: GLUCOSE-CAPILLARY: 93 mg/dL (ref 70–99)

## 2014-01-14 LAB — LIPASE, BLOOD: Lipase: 48 U/L (ref 11–59)

## 2014-01-14 LAB — TROPONIN I

## 2014-01-14 LAB — URINE MICROSCOPIC-ADD ON

## 2014-01-14 MED ORDER — ONDANSETRON HCL 4 MG PO TABS: 4.0000 mg | ORAL_TABLET | Freq: Four times a day (QID) | ORAL | Status: DC | PRN

## 2014-01-14 MED ORDER — MORPHINE SULFATE 2 MG/ML IJ SOLN
1.0000 mg | INTRAMUSCULAR | Status: DC | PRN
Start: 2014-01-14 — End: 2014-01-19

## 2014-01-14 MED ORDER — ACETAMINOPHEN 650 MG RE SUPP: 650.0000 mg | Freq: Four times a day (QID) | RECTAL | Status: DC | PRN

## 2014-01-14 MED ORDER — ONDANSETRON HCL 4 MG/2ML IJ SOLN: 4.0000 mg | Freq: Four times a day (QID) | INTRAMUSCULAR | Status: DC | PRN

## 2014-01-14 MED ORDER — DEXTROSE-NACL 5-0.9 % IV SOLN
INTRAVENOUS | Status: AC
Start: 2014-01-14 — End: 2014-01-15
  Administered 2014-01-15: via INTRAVENOUS

## 2014-01-14 MED ORDER — METOPROLOL TARTRATE 1 MG/ML IV SOLN
2.5000 mg | Freq: Four times a day (QID) | INTRAVENOUS | Status: DC
  Administered 2014-01-15 – 2014-01-18 (×15): 2.5 mg via INTRAVENOUS
  Filled 2014-01-14 (×18): qty 5

## 2014-01-14 MED ORDER — PIPERACILLIN-TAZOBACTAM 3.375 G IVPB
3.3750 g | Freq: Three times a day (TID) | INTRAVENOUS | Status: DC
  Administered 2014-01-15 – 2014-01-19 (×13): 3.375 g via INTRAVENOUS
  Filled 2014-01-14 (×14): qty 50

## 2014-01-14 MED ORDER — SODIUM CHLORIDE 0.9 % IV BOLUS (SEPSIS)
1000.0000 mL | Freq: Once | INTRAVENOUS | Status: AC
  Administered 2014-01-14: 1000 mL via INTRAVENOUS

## 2014-01-14 MED ORDER — ACETAMINOPHEN 325 MG PO TABS
650.0000 mg | ORAL_TABLET | Freq: Four times a day (QID) | ORAL | Status: DC | PRN
  Administered 2014-01-17: 650 mg via ORAL
  Filled 2014-01-14: qty 2

## 2014-01-14 MED ORDER — LEVOTHYROXINE SODIUM 100 MCG IV SOLR
50.0000 ug | Freq: Every day | INTRAVENOUS | Status: DC
  Administered 2014-01-15 – 2014-01-18 (×4): 50 ug via INTRAVENOUS
  Filled 2014-01-14 (×5): qty 5

## 2014-01-14 MED ORDER — IOHEXOL 300 MG/ML  SOLN
125.0000 mL | Freq: Once | INTRAMUSCULAR | Status: AC | PRN
  Administered 2014-01-14: 125 mL via INTRAVENOUS

## 2014-01-14 MED ORDER — HYDRALAZINE HCL 20 MG/ML IJ SOLN
10.0000 mg | INTRAMUSCULAR | Status: DC | PRN
  Administered 2014-01-17: 10 mg via INTRAVENOUS
  Filled 2014-01-14: qty 1

## 2014-01-14 MED ORDER — PIPERACILLIN-TAZOBACTAM 3.375 G IVPB 30 MIN
3.3750 g | Freq: Once | INTRAVENOUS | Status: AC
  Administered 2014-01-14: 3.375 g via INTRAVENOUS
  Filled 2014-01-14: qty 50

## 2014-01-14 NOTE — ED Provider Notes (Signed)
CSN: [id number]     Arrival date & time 01/14/14  1729 History   First MD Initiated Contact with Patient 01/14/14 1738     Chief Complaint  Patient presents with  . Abdominal Pain     (Consider location/radiation/quality/duration/timing/severity/associated sxs/prior Treatment) The history is provided by the patient. No language interpreter was used.  NAYTHEN HEIKKILA is a 80 y/o M with PMHx of dyslipidemia, HTN, obesity, CAD, spinal stenosis, ischemic cardiomyopathy on Xarelto presenting to the ED with abdominal pain that has been ongoing since Thanksgiving. Patient reported that he was seen and assessed by his primary care provider who diagnosed the patient with UTI - patient was started on ciprofloxacin which helped the hematuria episodes. Patient reported that he has been having suprapubic and left lower quadrant pain. Stated that the pain is a constant dull, aching discomfort - stated that the pain is worse during a BM - stated that when the stools pass the pain improves. Reported that he had a normal BM this morning that was unremarkable. Patient reported that he's been having loss of appetite. 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 Stated that he had a wound that would not heal properly and had to go to hyperbaric chamber for healing - stated that since then the wound has been healing well. Denied mucus in the stools, melena, hematochezia, nausea, vomiting, fever, chills, chest pain, shortness of breath, difficulty breathing. PCP Dr. Nyoka Cowden  Past Medical History  Diagnosis Date  . Dyslipidemia   . HTN (hypertension)   . Obesity   . CAD (coronary artery disease)     Silent MI sometime prior to 2000. S/P cath in 2000 showing a total mid RCA with collaterals from the LCX. EF is 40 to50%; Last Myoview in 2010 showing inferior scar without ischemia. EF was 52%.  .  Increased liver enzymes     felt to be a fatty liver per ultrasound in 2011  . Dyspnea   . Ischemic cardiomyopathy   . Spinal stenosis    Past Surgical History  Procedure Laterality Date  . Total hip arthroplasty    . Eye surgery    . Lower back surgery      spinal stenosis  . Shoulder surgery    . Cardioversion N/A 06/06/2012    Procedure: CARDIOVERSION;  Surgeon: Thayer Headings, MD;  Location: [facility] ENDOSCOPY;  Service: Cardiovascular;  Laterality: N/A;   Family History  Problem Relation Age of Onset  . Stroke Father   . Heart attack Father   . Hypertension Father   . Diabetes Father   . Drug abuse Mother   . Hypertension Brother   . Hypertension Brother   . Hypertension Brother   . Hypertension Sister    History  Substance Use Topics  . Smoking status: Never Smoker   . Smokeless tobacco: Never Used  . Alcohol Use: Yes     Comment: 2-4 glasses of wine daily    Review of Systems  Constitutional: Negative for fever and chills.  Respiratory: Negative for chest tightness and shortness of breath.   Cardiovascular: Negative for chest pain.  Gastrointestinal: Positive for abdominal pain. Negative for nausea, vomiting, diarrhea, constipation, blood in stool and anal bleeding.  Genitourinary: Negative for dysuria.  Neurological: Negative for dizziness, weakness and headaches.      Allergies  Review of patient's allergies indicates no  known allergies.  Home Medications   Prior to Admission medications   Medication Sig Start Date End Date Taking? Authorizing Provider  clobetasol cream (TEMOVATE) 4.70 % Apply 1 application topically daily as needed (dry skin.). As directed 03/23/12  Yes Historical Provider, MD  diphenhydrAMINE (BENADRYL) 25 mg capsule Take 25 mg by mouth at bedtime as needed for allergies or sleep.    Yes Historical Provider, MD  doxazosin (CARDURA) 8 MG tablet Take 8 mg by mouth at bedtime.   Yes Historical Provider, MD  ezetimibe (ZETIA) 10 MG tablet Take  10 mg by mouth daily.   Yes Historical Provider, MD  furosemide (LASIX) 40 MG tablet Take 40 mg by mouth as needed for fluid or edema.  04/29/12  Yes Blane Ohara, MD  gabapentin (NEURONTIN) 600 MG tablet Take 300 mg by mouth at bedtime.    Yes Historical Provider, MD  levothyroxine (SYNTHROID, LEVOTHROID) 100 MCG tablet Take 100 mcg by mouth daily. 09/20/13  Yes Historical Provider, MD  lisinopril (PRINIVIL,ZESTRIL) 10 MG tablet Take 1 (10 mg) tablet daily 09/23/13  Yes Blane Ohara, MD  metoprolol succinate (TOPROL-XL) 50 MG 24 hr tablet Take one tablet by mouth every morning, take one-half tablet every evening. Take with or immediately following a meal. Patient taking differently: Take 50 mg by mouth daily.  09/02/13  Yes Blane Ohara, MD  Multiple Vitamin (MULTIVITAMIN) tablet Take 1 tablet by mouth daily.   Yes Historical Provider, MD  naproxen sodium (ANAPROX) 220 MG tablet Take 220 mg by mouth at bedtime.   Yes Historical Provider, MD  tetracycline (ACHROMYCIN,SUMYCIN) 250 MG capsule Take 250 mg by mouth daily.   Yes Historical Provider, MD  XARELTO 20 MG TABS tablet TAKE 1 TABLET BY MOUTH DAILY WITH SUPPER. 07/16/13  Yes Blane Ohara, MD   BP 154/95 mmHg  Pulse 88  Temp(Src) 97.5 F (36.4 C) (Oral)  Resp 20  SpO2 95% Physical Exam  Constitutional: He is oriented to person, place, and time. He appears well-developed and well-nourished. No distress.  HENT:  Head: Normocephalic and atraumatic.  Mouth/Throat: Oropharynx is clear and moist. No oropharyngeal exudate.  Eyes: Conjunctivae and EOM are normal. Pupils are equal, round, and reactive to light. Right eye exhibits no discharge. Left eye exhibits no discharge.  Neck: Normal range of motion. Neck supple. No tracheal deviation present.  Cardiovascular: Normal rate, regular rhythm and normal heart sounds.  Exam reveals no friction rub.   No murmur heard. Pulmonary/Chest: Effort normal and breath sounds normal. No respiratory  distress. He has no wheezes. He has no rales.  Abdominal: Soft. Bowel sounds are normal. He exhibits no distension. There is tenderness. There is no rebound and no guarding.  Overweight Negative abdominal distention Bowel sounds normal active in all 4 quadrants Abdomen soft upon palpation Mild discomfort upon palpation to left lower quadrant and suprapubic region Umbilical hernia identified-does not appear to be incarcerated Negative peritoneal signs  Musculoskeletal: Normal range of motion.  Full ROM to upper and lower extremities without difficulty noted, negative ataxia noted.  Lymphadenopathy:    He has no cervical adenopathy.  Neurological: He is alert and oriented to person, place, and time. No cranial nerve deficit. He exhibits normal muscle tone. Coordination normal.  Cranial nerves III-XII grossly intact Patient follows commands well  Patient responds to questions appropriately  Negative facial drooping Negative aphasia Negative slurred speech  GCS 15  Skin: Skin is warm and dry. No rash noted. He is  not diaphoretic. No erythema.  Approximately 2 mm x 2 mm area of healing wound not to the dorsum of the right foot with bandage over it. Bandage removed - healing well - granulation tissues surrounding noted. Negative active bleeding noted. Yellowish discharge without odor noted - patient reported that this has been this way for a while. Negative pain upon palpation. Negative swelling or erythema - negative findings of cellulitic infection.   Psychiatric: He has a normal mood and affect. His behavior is normal. Thought content normal.  Nursing note and vitals reviewed.   ED Course  Procedures (including critical care time) Labs Review Labs Reviewed  CBC WITH DIFFERENTIAL - Abnormal; Notable for the following:    RBC 4.11 (*)    All other components within normal limits  COMPREHENSIVE METABOLIC PANEL - Abnormal; Notable for the following:    Sodium 134 (*)    Glucose, Bld 102  (*)    Alkaline Phosphatase 148 (*)    GFR calc non Af Amer 62 (*)    GFR calc Af Amer 72 (*)    All other components within normal limits  URINALYSIS, ROUTINE W REFLEX MICROSCOPIC - Abnormal; Notable for the following:    Hgb urine dipstick SMALL (*)    All other components within normal limits  PROTIME-INR - Abnormal; Notable for the following:    Prothrombin Time 17.6 (*)    All other components within normal limits  CULTURE, BLOOD (ROUTINE X 2)  CULTURE, BLOOD (ROUTINE X 2)  LIPASE, BLOOD  TROPONIN I  URINE MICROSCOPIC-ADD ON    Imaging Review Ct Abdomen Pelvis W Wo Contrast  01/14/2014   CLINICAL DATA:  Gross and microscopic hematuria. Low back and left flank pain. Lower abdominal pain, low grade fever.  EXAM: CT ABDOMEN AND PELVIS WITHOUT AND WITH CONTRAST  TECHNIQUE: Multidetector CT imaging of the abdomen and pelvis was performed following the standard protocol before and following the bolus administration of intravenous contrast.  CONTRAST:  152mL OMNIPAQUE IOHEXOL 300 MG/ML  SOLN  COMPARISON:  None.  FINDINGS: Lower chest: Lung bases show no acute findings. Extensive 3 vessel coronary artery calcification. Heart is at the upper limits of normal in size. No pericardial effusion.  Hepatobiliary: Liver is unremarkable. Stones are seen in the gallbladder. No biliary ductal dilatation.  Pancreas: A calcification is seen in the uncinate process of the pancreas, indicative of prior pancreatitis. Pancreas is otherwise unremarkable.  Spleen: Negative.  Adrenals/Urinary Tract: Adrenal glands are unremarkable. Stones are seen in the kidneys bilaterally. Renal cortical scarring, left greater than right. Ureters are decompressed. Opacified portions of the ureters are unremarkable. Bladder is grossly unremarkable but somewhat obscured by streak artifact from a left hip arthroplasty.  Stomach/Bowel: Stomach, small bowel, appendix and majority the colon are unremarkable. There are inflammatory changes  about the sigmoid colon with a localized collection of adjacent fluid and air, measuring 2.6 x 4.8 cm (series 4, image 65). It is seen along the superior margin of the bladder, without air in the bladder to indicate a fistula.  Vascular/Lymphatic: Atherosclerotic calcification of the arterial vasculature without abdominal aortic aneurysm. No pathologically enlarged lymph nodes.  Reproductive: Prostate is slightly enlarged.  Other: No free fluid. Fat containing periumbilical hernia measures 5.5 x 8.8 cm with the neck measuring 3.4 cm. Mesenteries and peritoneum are otherwise unremarkable.  Musculoskeletal: No worrisome lytic or sclerotic lesions. Left hip arthroplasty. Degenerative changes are seen in the spine.  IMPRESSION: 1. Sigmoid diverticulitis with localized microperforation and abscess.  2. Bilateral renal stones. No additional findings to explain the patient's hematuria. 3. Extensive 3 vessel coronary artery calcification. 4. Cholelithiasis. 5. Large fat containing periumbilical hernia.   Electronically Signed   By: Lorin Picket M.D.   On: 01/14/2014 15:43     EKG Interpretation   Date/Time:  Wednesday January 14 2014 18:40:46 EST Ventricular Rate:  71 PR Interval:    QRS Duration: 163 QT Interval:  440 QTC Calculation: 478 R Axis:   -6 Text Interpretation:  Atrial fibrillation Right bundle branch block agree.  old RBBB Confirmed by Johnney Killian, MD, Jeannie Done [phone]) on 01/14/2014 7:39:54 PM      8:35 PM Surgery has not called back. Surgery has been paged 3 times.   8:38 PM this provider spoke with Dr. Gershon Crane, on call physician for [facility]. Discussed case in great detail, labs, imaging, vitals, ED course. Agreed with IV fluids and antibiotics. Reported to admit to Internal Medicine and Surgery will consult. Recommended patient to be kept NPO.  8:53 PM This provider spoke with Dr. Gershon Crane - reviewed CT and stated that patient will need a drain placed tomorrow. Reported to hold Xarelto.   10:13 PM  This provider spoke with Dr. Hal Hope, [facility] - discussed case, labs, imaging, vitals, ED course agreed detail. Patient to be admitted to hospital-telemetry secondary to cardiac history. Discussed recommendations that surgery gave-Xarelto to be held secondary to drain to be placed tomorrow.  MDM   Final diagnoses:  Sigmoid diverticulitis  Intra-abdominal abscess    Medications  piperacillin-tazobactam (ZOSYN) IVPB 3.375 g (0 g Intravenous Stopped 01/14/14 2033)  sodium chloride 0.9 % bolus 1,000 mL (0 mLs Intravenous Stopped 01/14/14 2033)    Filed Vitals:   01/14/14 1735 01/14/14 1950 01/14/14 1953 01/14/14 2115  BP: 137/87  155/88 154/95  Pulse: 81 76 80 88  Temp: 97.5 F (36.4 C)     TempSrc: Oral     Resp: 18 20 15 20   SpO2: 100% 98% 98% 95%   This provider reviewed the patient's chart. Patient was seen and assessed by his primary care provider today where a CT abdomen and pelvis with contrast was performed. CT noted sigmoid diverticulitis with microperforation and formation of fluid collection, abscess measuring approximately 2.6 x 4.8 cm. Patient was referred to the ED for further evaluation.  EKG noted atrial fibrillation with a heart rate of 71 bpm with a right bundle branch block. Troponin negative elevation. CBC negative elevated white blood cell count. Hemoglobin 13.9, hematocrit 40.2. CMP noted elevated alkaline phosphatase of 148. Lipase negative elevation. INR unremarkble - PT elevated. Urinalysis noted small hemoglobin with a red blood cell count of 0-2. Negative nitrates and leukocytes identified-negative findings of infection. Blood cultures 2 pending. Patient presenting to the ED with abdominal pain that has been ongoing since Thanksgiving with most recent CT abdomen and pelvis with contrast performed today, 01/14/2014 identifying sigmoid diverticulitis with microperforation and abscess formation. Patient started on IV fluids and antibiotics in ED setting.  Discussed case with [facility]-patient to be admitted to telemetry. Patient to be held NPO and to hold Xarelto secondary to drain to be placed tomorrow morning. Surgery to see and consult. Discussed plan of admission with patient who agrees to plan of care. Patient not septic appearing. Patient stable for transfer.   Jamse Mead, PA-C 01/14/14 2228  Charlesetta Shanks, MD 01/15/14 [phone]

## 2014-01-14 NOTE — H&P (Signed)
[facility] History and Physical  Harold Jennings BWL:[id number] DOB: [birth year] DOA: 01/14/2014  Referring physician: ER physician. PCP: Harold Peaches, MD   Chief Complaint: Abdominal pain.  HPI: Harold Jennings is a 80 y.o. male with history of chronic atrial fibrillation on Xarelto, chronic systolic heart failure last EF measured was 40-45% in August 2015, OSA on C Pap, CAD, hypertension presents to the ER because of persistent abdominal pain over the last 1 month since Thanksgiving. Patient's pain is mostly around the left lower quadrant and infraumbilical area. Pain increases on bowel movement. Patient also has noticed some blood in his urine last couple of days. Denies any nausea vomiting or diarrhea. Patient was recently on antibiotics for UTI 3 weeks ago and patient states he finished a ten-day course of antibiotics. At that time his abdominal pain was better. Denies any fever chills chest pain shortness of breath. CT abdomen and pelvis done in the ER shows sigmoid diverticulitis with microperforation and abscess. On-call surgeon Dr. Prince Solian was consulted by the ER physician and patient has been admitted for further management.  Review of Systems: As presented in the history of presenting illness, rest negative.  Past Medical History  Diagnosis Date  . Dyslipidemia   . HTN (hypertension)   . Obesity   . CAD (coronary artery disease)     Silent MI sometime prior to 2000. S/P cath in 2000 showing a total mid RCA with collaterals from the LCX. EF is 40 to50%; Last Myoview in 2010 showing inferior scar without ischemia. EF was 52%.  . Increased liver enzymes     felt to be a fatty liver per ultrasound in 2011  . Dyspnea   . Ischemic cardiomyopathy   . Spinal stenosis    Past Surgical History  Procedure Laterality Date  . Total hip arthroplasty    . Eye surgery    . Lower back surgery      spinal stenosis  . Shoulder surgery    . Cardioversion N/A 06/06/2012    Procedure:  CARDIOVERSION;  Surgeon: Thayer Headings, MD;  Location: [facility] ENDOSCOPY;  Service: Cardiovascular;  Laterality: N/A;   Social History:  reports that he has never smoked. He has never used smokeless tobacco. He reports that he drinks alcohol. He reports that he does not use illicit drugs. Where does patient live home. Can patient participate in ADLs? Yes.  No Known Allergies  Family History:  Family History  Problem Relation Age of Onset  . Stroke Father   . Heart attack Father   . Hypertension Father   . Diabetes Father   . Drug abuse Mother   . Hypertension Brother   . Hypertension Brother   . Hypertension Brother   . Hypertension Sister       Prior to Admission medications   Medication Sig Start Date End Date Taking? Authorizing Provider  clobetasol cream (TEMOVATE) 6.81 % Apply 1 application topically daily as needed (dry skin.). As directed 03/23/12  Yes Historical Provider, MD  diphenhydrAMINE (BENADRYL) 25 mg capsule Take 25 mg by mouth at bedtime as needed for allergies or sleep.    Yes Historical Provider, MD  doxazosin (CARDURA) 8 MG tablet Take 8 mg by mouth at bedtime.   Yes Historical Provider, MD  ezetimibe (ZETIA) 10 MG tablet Take 10 mg by mouth daily.   Yes Historical Provider, MD  furosemide (LASIX) 40 MG tablet Take 40 mg by mouth as needed for fluid or edema.  04/29/12  Yes Blane Ohara, MD  gabapentin (NEURONTIN) 600 MG tablet Take 300 mg by mouth at bedtime.    Yes Historical Provider, MD  levothyroxine (SYNTHROID, LEVOTHROID) 100 MCG tablet Take 100 mcg by mouth daily. 09/20/13  Yes Historical Provider, MD  lisinopril (PRINIVIL,ZESTRIL) 10 MG tablet Take 1 (10 mg) tablet daily 09/23/13  Yes Blane Ohara, MD  metoprolol succinate (TOPROL-XL) 50 MG 24 hr tablet Take one tablet by mouth every morning, take one-half tablet every evening. Take with or immediately following a meal. Patient taking differently: Take 50 mg by mouth daily.  09/02/13  Yes Blane Ohara,  MD  Multiple Vitamin (MULTIVITAMIN) tablet Take 1 tablet by mouth daily.   Yes Historical Provider, MD  naproxen sodium (ANAPROX) 220 MG tablet Take 220 mg by mouth at bedtime.   Yes Historical Provider, MD  tetracycline (ACHROMYCIN,SUMYCIN) 250 MG capsule Take 250 mg by mouth daily.   Yes Historical Provider, MD  XARELTO 20 MG TABS tablet TAKE 1 TABLET BY MOUTH DAILY WITH SUPPER. 07/16/13  Yes Blane Ohara, MD    Physical Exam: Filed Vitals:   01/14/14 2115 01/14/14 2130 01/14/14 2200 01/14/14 2325  BP: 154/95 157/94 150/77   Pulse: 88 96 88   Temp:      TempSrc:      Resp: 20 19 20    Height:    5\' 6"  (1.676 m)  Weight:    101.606 kg (224 lb)  SpO2: 95% 95% 95%      General:  Well-developed and nourished.  Eyes: Anicteric. No pallor.  ENT: No discharge from the ears eyes nose and mouth.  Neck: No mass felt.  Cardiovascular: S1 and S2 heard.  Respiratory: No rhonchi or crepitations.  Abdomen: Mild tenderness in the left lower quadrant with no guarding or rigidity. Umbilical hernia.  Skin: No rash.  Musculoskeletal: No edema.  Psychiatric: Appears normal.  Neurologic: Alert awake oriented to time place and person. Moves all extremities.  Labs on Admission:  Basic Metabolic Panel:  Recent Labs Lab 01/14/14 1801  NA 134*  K 3.9  CL 102  CO2 23  GLUCOSE 102*  BUN 16  CREATININE 1.10  CALCIUM 8.9   Liver Function Tests:  Recent Labs Lab 01/14/14 1801  AST 31  ALT 25  ALKPHOS 148*  BILITOT 1.1  PROT 7.0  ALBUMIN 3.7    Recent Labs Lab 01/14/14 1801  LIPASE 48   No results for input(s): AMMONIA in the last 168 hours. CBC:  Recent Labs Lab 01/14/14 1801  WBC 7.2  NEUTROABS 4.8  HGB 13.9  HCT 40.2  MCV 97.8  PLT 168   Cardiac Enzymes:  Recent Labs Lab 01/14/14 1801  TROPONINI <0.03    BNP (last 3 results) No results for input(s): PROBNP in the last 8760 hours. CBG: No results for input(s): GLUCAP in the last 168  hours.  Radiological Exams on Admission: Ct Abdomen Pelvis W Wo Contrast  01/14/2014   CLINICAL DATA:  Gross and microscopic hematuria. Low back and left flank pain. Lower abdominal pain, low grade fever.  EXAM: CT ABDOMEN AND PELVIS WITHOUT AND WITH CONTRAST  TECHNIQUE: Multidetector CT imaging of the abdomen and pelvis was performed following the standard protocol before and following the bolus administration of intravenous contrast.  CONTRAST:  147mL OMNIPAQUE IOHEXOL 300 MG/ML  SOLN  COMPARISON:  None.  FINDINGS: Lower chest: Lung bases show no acute findings. Extensive 3 vessel coronary artery calcification. Heart is at the upper  limits of normal in size. No pericardial effusion.  Hepatobiliary: Liver is unremarkable. Stones are seen in the gallbladder. No biliary ductal dilatation.  Pancreas: A calcification is seen in the uncinate process of the pancreas, indicative of prior pancreatitis. Pancreas is otherwise unremarkable.  Spleen: Negative.  Adrenals/Urinary Tract: Adrenal glands are unremarkable. Stones are seen in the kidneys bilaterally. Renal cortical scarring, left greater than right. Ureters are decompressed. Opacified portions of the ureters are unremarkable. Bladder is grossly unremarkable but somewhat obscured by streak artifact from a left hip arthroplasty.  Stomach/Bowel: Stomach, small bowel, appendix and majority the colon are unremarkable. There are inflammatory changes about the sigmoid colon with a localized collection of adjacent fluid and air, measuring 2.6 x 4.8 cm (series 4, image 65). It is seen along the superior margin of the bladder, without air in the bladder to indicate a fistula.  Vascular/Lymphatic: Atherosclerotic calcification of the arterial vasculature without abdominal aortic aneurysm. No pathologically enlarged lymph nodes.  Reproductive: Prostate is slightly enlarged.  Other: No free fluid. Fat containing periumbilical hernia measures 5.5 x 8.8 cm with the neck  measuring 3.4 cm. Mesenteries and peritoneum are otherwise unremarkable.  Musculoskeletal: No worrisome lytic or sclerotic lesions. Left hip arthroplasty. Degenerative changes are seen in the spine.  IMPRESSION: 1. Sigmoid diverticulitis with localized microperforation and abscess. 2. Bilateral renal stones. No additional findings to explain the patient's hematuria. 3. Extensive 3 vessel coronary artery calcification. 4. Cholelithiasis. 5. Large fat containing periumbilical hernia.   Electronically Signed   By: Lorin Picket M.D.   On: 01/14/2014 15:43     Assessment/Plan Principal Problem:   Sigmoid diverticulitis Active Problems:   Essential hypertension   OSA (obstructive sleep apnea)   Chronic atrial fibrillation   Chronic systolic congestive heart failure   Intra-abdominal abscess   1. Sigmoid diverticulitis with microperforation and abscess - on-call surgeon Dr.Tsuie was consulted. Dr. Prince Solian has advised to keep patient nothing by mouth and to hold off patient's anticoagulant in anticipation of possible drain placement. Patient has been placed on Zosyn. Further recommendations per surgery. 2. Chronic atrial fibrillation - since patient is nothing by mouth I have placed patient on scheduled dose of IV metoprolol 2.5 mg every 6 hourly with holding parameters. Patient's xarelto is on hold secondary to #1. Patient is agreeable to holding xarelto. Patient does not want any bridging heparin. 3. Chronic systolic heart failure - appears compensated at this time. Patient is getting gentle hydration as patient is nothing by mouth. Closely follow respiratory status. Patient's last EF measured in August 2015 was 40-45% and 4. CAD - denies any chest pain. 5. OSA on CPAP. 6. Hypertension - since patient is nothing by mouth I have placed patient on when necessary IV hydralazine and patient is also on scheduled dose of metoprolol.  DVT Prophylaxis SCDs.  Code Status:  Full code.  Family Communication:   None.  Disposition Plan:  Admit to inpatient.    Udell Blasingame N. [facility] Pager [phone].  If 7PM-7AM, please contact night-coverage [email or web address] Password [email or web address] 01/14/2014, 11:40 PM

## 2014-01-14 NOTE — ED Notes (Signed)
Pt presents with c/o abdominal discomfort for the past month or so. Pt reports he has had some discomfort with trying to have a bowel movement. Pt went to his Dr. today and had a CT that showed diverticulitis with an abscess. Pt sent over for evaluation.

## 2014-01-14 NOTE — ED Notes (Signed)
Unable to obtain blood from IV stick. Phlebotomy to attempt.

## 2014-01-15 ENCOUNTER — Encounter (HOSPITAL_COMMUNITY): Payer: Self-pay | Admitting: Surgery

## 2014-01-15 DIAGNOSIS — I482 Chronic atrial fibrillation: Secondary | ICD-10-CM

## 2014-01-15 DIAGNOSIS — I5022 Chronic systolic (congestive) heart failure: Secondary | ICD-10-CM

## 2014-01-15 DIAGNOSIS — I10 Essential (primary) hypertension: Secondary | ICD-10-CM

## 2014-01-15 DIAGNOSIS — K5732 Diverticulitis of large intestine without perforation or abscess without bleeding: Secondary | ICD-10-CM

## 2014-01-15 LAB — COMPREHENSIVE METABOLIC PANEL
ALT: 22 U/L (ref 0–53)
AST: 30 U/L (ref 0–37)
Albumin: 3.3 g/dL — ABNORMAL LOW (ref 3.5–5.2)
Alkaline Phosphatase: 136 U/L — ABNORMAL HIGH (ref 39–117)
Anion gap: 8 (ref 5–15)
BUN: 17 mg/dL (ref 6–23)
CALCIUM: 8.4 mg/dL (ref 8.4–10.5)
CO2: 25 mmol/L (ref 19–32)
Chloride: 101 mEq/L (ref 96–112)
Creatinine, Ser: 1.41 mg/dL — ABNORMAL HIGH (ref 0.50–1.35)
GFR calc Af Amer: 53 mL/min — ABNORMAL LOW (ref >90)
GFR, EST NON AFRICAN AMERICAN: 46 mL/min — AB (ref >90)
Glucose, Bld: 103 mg/dL — ABNORMAL HIGH (ref 70–99)
Potassium: 4.1 mmol/L (ref 3.5–5.1)
SODIUM: 134 mmol/L — AB (ref 135–145)
Total Bilirubin: 1.8 mg/dL — ABNORMAL HIGH (ref 0.3–1.2)
Total Protein: 6.5 g/dL (ref 6.0–8.3)

## 2014-01-15 LAB — GLUCOSE, CAPILLARY
Glucose-Capillary: 106 mg/dL — ABNORMAL HIGH (ref 70–99)
Glucose-Capillary: 111 mg/dL — ABNORMAL HIGH (ref 70–99)
Glucose-Capillary: 119 mg/dL — ABNORMAL HIGH (ref 70–99)
Glucose-Capillary: 125 mg/dL — ABNORMAL HIGH (ref 70–99)
Glucose-Capillary: 92 mg/dL (ref 70–99)

## 2014-01-15 LAB — CBC WITH DIFFERENTIAL/PLATELET
Basophils Absolute: 0 10*3/uL (ref 0.0–0.1)
Basophils Relative: 0 % (ref 0–1)
EOS PCT: 4 % (ref 0–5)
Eosinophils Absolute: 0.3 10*3/uL (ref 0.0–0.7)
HCT: 40.5 % (ref 39.0–52.0)
HEMOGLOBIN: 13.5 g/dL (ref 13.0–17.0)
Lymphocytes Relative: 15 % (ref 12–46)
Lymphs Abs: 1.1 10*3/uL (ref 0.7–4.0)
MCH: 32.7 pg (ref 26.0–34.0)
MCHC: 33.3 g/dL (ref 30.0–36.0)
MCV: 98.1 fL (ref 78.0–100.0)
Monocytes Absolute: 1 10*3/uL (ref 0.1–1.0)
Monocytes Relative: 13 % — ABNORMAL HIGH (ref 3–12)
Neutro Abs: 5.2 10*3/uL (ref 1.7–7.7)
Neutrophils Relative %: 68 % (ref 43–77)
Platelets: 182 10*3/uL (ref 150–400)
RBC: 4.13 MIL/uL — ABNORMAL LOW (ref 4.22–5.81)
RDW: 14.7 % (ref 11.5–15.5)
WBC: 7.7 10*3/uL (ref 4.0–10.5)

## 2014-01-15 LAB — PROTIME-INR
INR: 1.52 — AB (ref 0.00–1.49)
PROTHROMBIN TIME: 18.5 s — AB (ref 11.6–15.2)

## 2014-01-15 MED ORDER — ZOLPIDEM TARTRATE 5 MG PO TABS
5.0000 mg | ORAL_TABLET | Freq: Once | ORAL | Status: AC
  Administered 2014-01-15: 5 mg via ORAL
  Filled 2014-01-15: qty 1

## 2014-01-15 MED ORDER — MENTHOL 3 MG MT LOZG
1.0000 | LOZENGE | OROMUCOSAL | Status: DC | PRN
  Filled 2014-01-15 (×2): qty 9

## 2014-01-15 NOTE — Progress Notes (Signed)
ANTIBIOTIC CONSULT NOTE - INITIAL  Pharmacy Consult for zosyn  Indication: Intra-abdominal infection  No Known Allergies  Patient Measurements: Height: 5\' 6"  (167.6 cm) Weight: 224 lb (101.606 kg) IBW/kg (Calculated) : 63.8   Vital Signs: Temp: 97.5 F (36.4 C) (12/30 1735) Temp Source: Oral (12/30 1735) BP: 150/77 mmHg (12/30 2200) Pulse Rate: 88 (12/30 2200) Intake/Output from previous day:   Intake/Output from this shift:    Labs:  Recent Labs  01/14/14 1801  WBC 7.2  HGB 13.9  PLT 168  CREATININE 1.10   Estimated Creatinine Clearance: 60.8 mL/min (by C-G formula based on Cr of 1.1). No results for input(s): VANCOTROUGH, VANCOPEAK, VANCORANDOM, GENTTROUGH, GENTPEAK, GENTRANDOM, TOBRATROUGH, TOBRAPEAK, TOBRARND, AMIKACINPEAK, AMIKACINTROU, AMIKACIN in the last 72 hours.   Microbiology: No results found for this or any previous visit (from the past 720 hour(s)).  Medical History: Past Medical History  Diagnosis Date  . Dyslipidemia   . HTN (hypertension)   . Obesity   . CAD (coronary artery disease)     Silent MI sometime prior to 2000. S/P cath in 2000 showing a total mid RCA with collaterals from the LCX. EF is 40 to50%; Last Myoview in 2010 showing inferior scar without ischemia. EF was 52%.  . Increased liver enzymes     felt to be a fatty liver per ultrasound in 2011  . Dyspnea   . Ischemic cardiomyopathy   . Spinal stenosis     Medications:  Scheduled:  . levothyroxine  50 mcg Intravenous Daily  . metoprolol  2.5 mg Intravenous 4 times per day  . piperacillin-tazobactam (ZOSYN)  IV  3.375 g Intravenous Q8H   Infusions:  . dextrose 5 % and 0.9% NaCl     Assessment: 57 yoM c/o abdominal pain.  CT shows sigmoid diverticulitis with microperforation and abscess formation.  Zosyn per Rx.   Goal of Therapy:  Treat infection  Plan:   Zosyn 3.375 Gm IV q8h EI  F/u SCr/cultures  [name] R 01/15/2014,12:09 AM

## 2014-01-15 NOTE — Progress Notes (Signed)
INITIAL NUTRITION ASSESSMENT  DOCUMENTATION CODES Per approved criteria  -Obesity Unspecified   INTERVENTION: Supplement diet as appropriate.   NUTRITION DIAGNOSIS: Inadequate oral intake related to inability to eat as evidenced by NPO status  Goal: Pt to meet >/= 90% of their estimated nutrition needs   Monitor:  Diet advancement, PO intake, weight trends, labs  Reason for Assessment: Pt identified as at nutrition risk on the Malnutrition Screen Tool  80 y.o. male  Admitting Dx: Sigmoid diverticulitis  ASSESSMENT: Pt admitted with abdominal pain for the last month. Pt was also recently on abx for UTI.  CT reveals pt with sigmoid diverticulitis with microperforation and abscess.  Pt now on abx and NPO.   Per surgical note pt may be developing a colovesical fistula, urology consult pending.   Per pt he has had no appetite for the last 6 months with gradual weight loss. Pt has had 15% weight loss x 1 year.  Breakfast: none vs cereal Lunch: Programmer, multimedia: wife cooks (meat, veg, starch) Per pt he is eating a 1/2 portion at meals.    Height: Ht Readings from Last 1 Encounters:  01/14/14 5\' 6"  (1.676 m)    Weight: Wt Readings from Last 1 Encounters:  01/14/14 224 lb (101.606 kg)    Ideal Body Weight: 64.5 kg   % Ideal Body Weight: 158%  Wt Readings from Last 10 Encounters:  01/14/14 224 lb (101.606 kg)  12/09/13 227 lb 12.8 oz (103.329 kg)  11/04/13 224 lb (101.606 kg)  09/04/13 225 lb (102.059 kg)  09/02/13 225 lb 3.2 oz (102.15 kg)  07/21/13 237 lb 9.6 oz (107.775 kg)  06/30/13 232 lb (105.235 kg)  06/25/13 235 lb (106.595 kg)  03/25/13 245 lb (111.131 kg)  01/03/13 261 lb (118.389 kg)    Usual Body Weight: 264 lb   % Usual Body Weight: 85%  BMI:  Body mass index is 36.17 kg/(m^2).  Estimated Nutritional Needs: Kcal: 2000-2200 Protein: 100-110 grams Fluid: > 2L/day  Skin:  Wound on right foot  Diet Order: Diet NPO time  specified  EDUCATION NEEDS: -No education needs identified at this time   Intake/Output Summary (Last 24 hours) at 01/15/14 1007 Last data filed at 01/15/14 0600  Gross per 24 hour  Intake 290.83 ml  Output    250 ml  Net  40.83 ml    Last BM: 12/30   Labs:   Recent Labs Lab 01/14/14 1801 01/15/14 0509  NA 134* 134*  K 3.9 4.1  CL 102 101  CO2 23 25  BUN 16 17  CREATININE 1.10 1.41*  CALCIUM 8.9 8.4  GLUCOSE 102* 103*    CBG (last 3)   Recent Labs  01/14/14 2353 01/15/14 0506  GLUCAP 93 92    Scheduled Meds: . levothyroxine  50 mcg Intravenous Daily  . metoprolol  2.5 mg Intravenous 4 times per day  . piperacillin-tazobactam (ZOSYN)  IV  3.375 g Intravenous Q8H    Continuous Infusions: . dextrose 5 % and 0.9% NaCl 50 mL/hr at 01/15/14 0011    Past Medical History  Diagnosis Date  . Dyslipidemia   . HTN (hypertension)   . Obesity   . CAD (coronary artery disease)     Silent MI sometime prior to 2000. S/P cath in 2000 showing a total mid RCA with collaterals from the LCX. EF is 40 to50%; Last Myoview in 2010 showing inferior scar without ischemia. EF was 52%.  . Increased liver enzymes  felt to be a fatty liver per ultrasound in 2011  . Dyspnea   . Ischemic cardiomyopathy   . Spinal stenosis     Past Surgical History  Procedure Laterality Date  . Total hip arthroplasty    . Eye surgery    . Lower back surgery      spinal stenosis  . Shoulder surgery    . Cardioversion N/A 06/06/2012    Procedure: CARDIOVERSION;  Surgeon: Thayer Headings, MD;  Location: [facility];  Service: Cardiovascular;  Laterality: N/A;    Maylon Peppers RD, [place], [place] Pager [phone] After Hours Pager

## 2014-01-15 NOTE — Consult Note (Signed)
General Surgery [facility]  Reason for Consult: diverticular disease with abscess  Referring Physician: Dr. Hal Hope, [facility]  Harold Jennings is an 79 y.o. male.  HPI: patient is a 80 yo WM retired [employer].  Patient has had symptoms since Thanksgiving when he was treated for bladder infection with Cipro.  Patient has noted gross hematuria on occasion.  Persistent lower abdominal pain.  Patient presented to primary care physician and CT abd was performed 01/14/14 showing diverticular disease with abscess adjacent to bladder.   WBC normal.  Admitted to medical service for IV abx.  Patient is anticoagulated for chronic AF.  Longstanding umbilical hernia containing fat, asymptomatic.  No prior abdominal surgery.  History of nephrolithiasis X2.  Past Medical History  Diagnosis Date  . Dyslipidemia   . HTN (hypertension)   . Obesity   . CAD (coronary artery disease)     Silent MI sometime prior to 2000. S/P cath in 2000 showing a total mid RCA with collaterals from the LCX. EF is 40 to50%; Last Myoview in 2010 showing inferior scar without ischemia. EF was 52%.  . Increased liver enzymes     felt to be a fatty liver per ultrasound in 2011  . Dyspnea   . Ischemic cardiomyopathy   . Spinal stenosis     Past Surgical History  Procedure Laterality Date  . Total hip arthroplasty    . Eye surgery    . Lower back surgery      spinal stenosis  . Shoulder surgery    . Cardioversion N/A 06/06/2012    Procedure: CARDIOVERSION;  Surgeon: Thayer Headings, MD;  Location: [facility] ENDOSCOPY;  Service: Cardiovascular;  Laterality: N/A;    Family History  Problem Relation Age of Onset  . Stroke Father   . Heart attack Father   . Hypertension Father   . Diabetes Father   . Drug abuse Mother   . Hypertension Brother   . Hypertension Brother   . Hypertension Brother   . Hypertension Sister     Social History:  reports that he has never smoked. He has never  used smokeless tobacco. He reports that he drinks alcohol. He reports that he does not use illicit drugs.  Allergies: No Known Allergies  Medications: I have reviewed the patient's current medications.  Results for orders placed or performed during the hospital encounter of 01/14/14 (from the past 48 hour(s))  CBC with Differential     Status: Abnormal   Collection Time: 01/14/14  6:01 PM  Result Value Ref Range   WBC 7.2 4.0 - 10.5 K/uL   RBC 4.11 (L) 4.22 - 5.81 MIL/uL   Hemoglobin 13.9 13.0 - 17.0 g/dL   HCT 40.2 39.0 - 52.0 %   MCV 97.8 78.0 - 100.0 fL   MCH 33.8 26.0 - 34.0 pg   MCHC 34.6 30.0 - 36.0 g/dL   RDW 14.8 11.5 - 15.5 %   Platelets 168 150 - 400 K/uL   Neutrophils Relative % 67 43 - 77 %   Neutro Abs 4.8 1.7 - 7.7 K/uL   Lymphocytes Relative 18 12 - 46 %   Lymphs Abs 1.3 0.7 - 4.0 K/uL   Monocytes Relative 11 3 - 12 %   Monocytes Absolute 0.8 0.1 - 1.0 K/uL   Eosinophils Relative 4 0 - 5 %   Eosinophils Absolute 0.3 0.0 - 0.7 K/uL   Basophils Relative 0 0 - 1 %   Basophils Absolute  0.0 0.0 - 0.1 K/uL  Comprehensive metabolic panel     Status: Abnormal   Collection Time: 01/14/14  6:01 PM  Result Value Ref Range   Sodium 134 (L) 135 - 145 mmol/L    Comment: Please note change in reference range.   Potassium 3.9 3.5 - 5.1 mmol/L    Comment: Please note change in reference range.   Chloride 102 96 - 112 mEq/L   CO2 23 19 - 32 mmol/L   Glucose, Bld 102 (H) 70 - 99 mg/dL   BUN 16 6 - 23 mg/dL   Creatinine, Ser 1.10 0.50 - 1.35 mg/dL   Calcium 8.9 8.4 - 10.5 mg/dL   Total Protein 7.0 6.0 - 8.3 g/dL   Albumin 3.7 3.5 - 5.2 g/dL   AST 31 0 - 37 U/L   ALT 25 0 - 53 U/L   Alkaline Phosphatase 148 (H) 39 - 117 U/L   Total Bilirubin 1.1 0.3 - 1.2 mg/dL   GFR calc non Af Amer 62 (L) >90 mL/min   GFR calc Af Amer 72 (L) >90 mL/min    Comment: (NOTE) The eGFR has been calculated using the CKD EPI equation. This calculation has not been validated in all clinical  situations. eGFR's persistently <90 mL/min signify possible Chronic Kidney Disease.    Anion gap 9 5 - 15  Lipase, blood     Status: None   Collection Time: 01/14/14  6:01 PM  Result Value Ref Range   Lipase 48 11 - 59 U/L  Troponin I     Status: None   Collection Time: 01/14/14  6:01 PM  Result Value Ref Range   Troponin I <0.03 <0.031 ng/mL    Comment:        NO INDICATION OF MYOCARDIAL INJURY. Please note change in reference range.   Urinalysis, Routine w reflex microscopic     Status: Abnormal   Collection Time: 01/14/14  6:22 PM  Result Value Ref Range   Color, Urine YELLOW YELLOW   APPearance CLEAR CLEAR   Specific Gravity, Urine 1.028 1.005 - 1.030   pH 5.5 5.0 - 8.0   Glucose, UA NEGATIVE NEGATIVE mg/dL   Hgb urine dipstick SMALL (A) NEGATIVE   Bilirubin Urine NEGATIVE NEGATIVE   Ketones, ur NEGATIVE NEGATIVE mg/dL   Protein, ur NEGATIVE NEGATIVE mg/dL   Urobilinogen, UA 0.2 0.0 - 1.0 mg/dL   Nitrite NEGATIVE NEGATIVE   Leukocytes, UA NEGATIVE NEGATIVE  Urine microscopic-add on     Status: None   Collection Time: 01/14/14  6:22 PM  Result Value Ref Range   Squamous Epithelial / LPF RARE RARE   RBC / HPF 0-2 <3 RBC/hpf  Protime-INR     Status: Abnormal   Collection Time: 01/14/14  8:54 PM  Result Value Ref Range   Prothrombin Time 17.6 (H) 11.6 - 15.2 seconds   INR 1.43 0.00 - 1.49  Glucose, capillary     Status: None   Collection Time: 01/14/14 11:53 PM  Result Value Ref Range   Glucose-Capillary 93 70 - 99 mg/dL  Glucose, capillary     Status: None   Collection Time: 01/15/14  5:06 AM  Result Value Ref Range   Glucose-Capillary 92 70 - 99 mg/dL  Comprehensive metabolic panel     Status: Abnormal   Collection Time: 01/15/14  5:09 AM  Result Value Ref Range   Sodium 134 (L) 135 - 145 mmol/L    Comment: Please note change in reference range.  Potassium 4.1 3.5 - 5.1 mmol/L    Comment: Please note change in reference range.   Chloride 101 96 - 112 mEq/L    CO2 25 19 - 32 mmol/L   Glucose, Bld 103 (H) 70 - 99 mg/dL   BUN 17 6 - 23 mg/dL   Creatinine, Ser 1.41 (H) 0.50 - 1.35 mg/dL   Calcium 8.4 8.4 - 10.5 mg/dL   Total Protein 6.5 6.0 - 8.3 g/dL   Albumin 3.3 (L) 3.5 - 5.2 g/dL   AST 30 0 - 37 U/L   ALT 22 0 - 53 U/L   Alkaline Phosphatase 136 (H) 39 - 117 U/L   Total Bilirubin 1.8 (H) 0.3 - 1.2 mg/dL   GFR calc non Af Amer 46 (L) >90 mL/min   GFR calc Af Amer 53 (L) >90 mL/min    Comment: (NOTE) The eGFR has been calculated using the CKD EPI equation. This calculation has not been validated in all clinical situations. eGFR's persistently <90 mL/min signify possible Chronic Kidney Disease.    Anion gap 8 5 - 15  CBC with Differential     Status: Abnormal   Collection Time: 01/15/14  5:09 AM  Result Value Ref Range   WBC 7.7 4.0 - 10.5 K/uL   RBC 4.13 (L) 4.22 - 5.81 MIL/uL   Hemoglobin 13.5 13.0 - 17.0 g/dL   HCT 40.5 39.0 - 52.0 %   MCV 98.1 78.0 - 100.0 fL   MCH 32.7 26.0 - 34.0 pg   MCHC 33.3 30.0 - 36.0 g/dL   RDW 14.7 11.5 - 15.5 %   Platelets 182 150 - 400 K/uL   Neutrophils Relative % 68 43 - 77 %   Neutro Abs 5.2 1.7 - 7.7 K/uL   Lymphocytes Relative 15 12 - 46 %   Lymphs Abs 1.1 0.7 - 4.0 K/uL   Monocytes Relative 13 (H) 3 - 12 %   Monocytes Absolute 1.0 0.1 - 1.0 K/uL   Eosinophils Relative 4 0 - 5 %   Eosinophils Absolute 0.3 0.0 - 0.7 K/uL   Basophils Relative 0 0 - 1 %   Basophils Absolute 0.0 0.0 - 0.1 K/uL  Protime-INR     Status: Abnormal   Collection Time: 01/15/14  5:09 AM  Result Value Ref Range   Prothrombin Time 18.5 (H) 11.6 - 15.2 seconds   INR 1.52 (H) 0.00 - 1.49    Ct Abdomen Pelvis W Wo Contrast  01/14/2014   CLINICAL DATA:  Gross and microscopic hematuria. Low back and left flank pain. Lower abdominal pain, low grade fever.  EXAM: CT ABDOMEN AND PELVIS WITHOUT AND WITH CONTRAST  TECHNIQUE: Multidetector CT imaging of the abdomen and pelvis was performed following the standard protocol  before and following the bolus administration of intravenous contrast.  CONTRAST:  159m OMNIPAQUE IOHEXOL 300 MG/ML  SOLN  COMPARISON:  None.  FINDINGS: Lower chest: Lung bases show no acute findings. Extensive 3 vessel coronary artery calcification. Heart is at the upper limits of normal in size. No pericardial effusion.  Hepatobiliary: Liver is unremarkable. Stones are seen in the gallbladder. No biliary ductal dilatation.  Pancreas: A calcification is seen in the uncinate process of the pancreas, indicative of prior pancreatitis. Pancreas is otherwise unremarkable.  Spleen: Negative.  Adrenals/Urinary Tract: Adrenal glands are unremarkable. Stones are seen in the kidneys bilaterally. Renal cortical scarring, left greater than right. Ureters are decompressed. Opacified portions of the ureters are unremarkable. Bladder is grossly unremarkable but  somewhat obscured by streak artifact from a left hip arthroplasty.  Stomach/Bowel: Stomach, small bowel, appendix and majority the colon are unremarkable. There are inflammatory changes about the sigmoid colon with a localized collection of adjacent fluid and air, measuring 2.6 x 4.8 cm (series 4, image 65). It is seen along the superior margin of the bladder, without air in the bladder to indicate a fistula.  Vascular/Lymphatic: Atherosclerotic calcification of the arterial vasculature without abdominal aortic aneurysm. No pathologically enlarged lymph nodes.  Reproductive: Prostate is slightly enlarged.  Other: No free fluid. Fat containing periumbilical hernia measures 5.5 x 8.8 cm with the neck measuring 3.4 cm. Mesenteries and peritoneum are otherwise unremarkable.  Musculoskeletal: No worrisome lytic or sclerotic lesions. Left hip arthroplasty. Degenerative changes are seen in the spine.  IMPRESSION: 1. Sigmoid diverticulitis with localized microperforation and abscess. 2. Bilateral renal stones. No additional findings to explain the patient's hematuria. 3.  Extensive 3 vessel coronary artery calcification. 4. Cholelithiasis. 5. Large fat containing periumbilical hernia.   Electronically Signed   By: Lorin Picket M.D.   On: 01/14/2014 15:43    Review of Systems  Constitutional: Negative for fever, chills, weight loss, malaise/fatigue and diaphoresis.  HENT: Negative.   Eyes: Negative.   Respiratory: Positive for cough.   Cardiovascular: Negative.   Gastrointestinal: Positive for abdominal pain (lower abdomen).  Genitourinary: Positive for hematuria.  Musculoskeletal: Negative.   Skin: Negative.   Neurological: Negative.   Endo/Heme/Allergies: Bruises/bleeds easily.  Psychiatric/Behavioral: Negative.    Blood pressure 126/83, pulse 97, temperature 98.5 F (36.9 C), temperature source Oral, resp. rate 18, height '5\' 6"'  (1.676 m), weight 224 lb (101.606 kg), SpO2 97 %. Physical Exam  Constitutional: He is oriented to person, place, and time. He appears well-developed and well-nourished. No distress.  HENT:  Head: Normocephalic and atraumatic.  Right Ear: External ear normal.  Left Ear: External ear normal.  Eyes: Conjunctivae are normal. Pupils are equal, round, and reactive to light. No scleral icterus.  Neck: Normal range of motion. Neck supple. No tracheal deviation present. No thyromegaly present.  Cardiovascular: Normal rate and normal heart sounds.   Irregular rhythm, rate controlled  Respiratory: Effort normal and breath sounds normal. He has no wheezes.  GI: Soft. Bowel sounds are normal. He exhibits no distension and no mass. There is tenderness (LLQ). There is no rebound and no guarding.  Musculoskeletal: Normal range of motion. He exhibits no edema.  Neurological: He is alert and oriented to person, place, and time.  Skin: Skin is warm and dry.  Psychiatric: He has a normal mood and affect. His behavior is normal.    Assessment/Plan: 1.  Acute diverticulitis with small abscess 2.  Hematuria 3.  Incarcerated (fat)  umbilical hernia  I discussed the CT scan with Dr. Kathlene Cote from [place].  Actual abscess is quite small (approx 2 cm) and not amenable to percutaneous drainage.  Will plan to continue IV abx and repeat CT scan in 3-5 days if improving clinically.  Given the location of the abscess and the recent urinary infection and hematuria, patient may be developing a colovesical fistula, or my have other bladder pathology.  Will request non-urgent urologic consultation.  Call place to Dr. Diona Fanti this morning.  Will allow clear liquid diet.  Continue to hold anticoagulation for now.  Will follow with you.  Hopefully operative intervention will not be needed at this time.  Earnstine Regal, MD, [facility] Office: [place]  01/15/2014, 10:01 AM

## 2014-01-15 NOTE — Progress Notes (Addendum)
Patient ID: Harold Jennings, male   DOB: 1932-01-21, 80 y.o.   MRN: [id number] [facility] PROGRESS NOTE  CLAIRE BRIDGE PPI:[id number] DOB: 1932/10/14 DOA: 01/14/2014 PCP: Criselda Peaches, MD  Brief narrative:    80 y.o. male with history of chronic atrial fibrillation on Xarelto, chronic systolic heart failure (last EF measured was 40-45% in August 2015), OSA on CPAP, CAD, hypertension who presented to [facility] ED with persistent abdominal pain for past 1 month prior to this admission. Patient reported pain to be mostly in left lower quadrant and periumbilical area. Patient reported pain is worse with bowel movement and he has noticed some blood in the urine last couple of days prior to this admission. No associated nausea vomiting or diarrhea. Patient was recently on antibiotics for urinary tract infection. Patient was found to have sigmoid diverticulitis with microperforation and abscess on this admission. He was started on Zosyn. Surgery will see the patient in consultation.   Assessment/Plan:    Principal Problem: Abdominal pain in the setting of sigmoid diverticulitis with microperforation and abscess  CT abdomen on this admission showed sigmoid diverticulitis with microperforation and abscess.  Patient started on Zosyn.  Surgery will see the patient in consultation.  Continue supportive care with IV fluids, analgesia as needed.  Keep nothing by mouth.  Active Problems: Essential hypertension  Continue metoprolol 2.5 mg IV every 6 hours.   OSA (obstructive sleep apnea)  Patient using CPAP at bedtime   Chronic atrial fibrillation  Anticoagulation on hold in case surgery or procedure required.   Rate controlled with metoprolol 2.5 mg IV every 6 hours.  Chronic systolic heart failure   Compensated at this time. Patient had 2-D echo in August 2015 with ejection fraction 40-45%.  Hypothyroidism  Continue Synthroid 50 g daily IV.   DVT Prophylaxis   SCDs  bilaterally  Code Status: Full.  Family Communication:  plan of care discussed with the patient Disposition Plan: Home when stable.   IV access:  Peripheral IV  Procedures and diagnostic studies:    Ct Abdomen Pelvis W Wo Contrast 01/14/2014  1. Sigmoid diverticulitis with localized microperforation and abscess. 2. Bilateral renal stones. No additional findings to explain the patient's hematuria. 3. Extensive 3 vessel coronary artery calcification. 4. Cholelithiasis. 5. Large fat containing periumbilical hernia.      Medical Consultants:  Surgery   Other Consultants:  None   IAnti-Infectives:   Zosyn 01/14/2014 -->   Leisa Lenz, MD  [facility] Pager [phone]  If 7PM-7AM, please contact night-coverage [email or web address] Password [email or web address] 01/15/2014, 7:08 AM   LOS: 1 day    HPI/Subjective: No acute overnight events.  Objective: Filed Vitals:   01/14/14 2200 01/14/14 2325 01/15/14 0047 01/15/14 0458  BP: 150/77 169/85  126/83  Pulse: 88 90 102 97  Temp:  98.3 F (36.8 C)  98.5 F (36.9 C)  TempSrc:  Oral  Oral  Resp: 20 18 20 18   Height:  5\' 6"  (1.676 m)    Weight:  101.606 kg (224 lb)    SpO2: 95% 94% 95% 97%    Intake/Output Summary (Last 24 hours) at 01/15/14 1093 Last data filed at 01/15/14 0600  Gross per 24 hour  Intake 290.83 ml  Output    250 ml  Net  40.83 ml    Exam:   General:  Pt is alert, follows commands appropriately, not in acute distress  Cardiovascular: Regular rate and rhythm, S1/S2 appreciated   Respiratory: Clear to auscultation bilaterally, no  wheezing, no crackles, no rhonchi  Abdomen: non-tender, distended, bowel sounds present  Extremities: pulses DP and PT palpable bilaterally  Neuro: Grossly nonfocal  Data Reviewed: Basic Metabolic Panel:  Recent Labs Lab 01/14/14 1801 01/15/14 0509  NA 134* 134*  K 3.9 4.1  CL 102 101  CO2 23 25  GLUCOSE 102* 103*  BUN 16 17  CREATININE 1.10 1.41*  CALCIUM 8.9 8.4    Liver Function Tests:  Recent Labs Lab 01/14/14 1801 01/15/14 0509  AST 31 30  ALT 25 22  ALKPHOS 148* 136*  BILITOT 1.1 1.8*  PROT 7.0 6.5  ALBUMIN 3.7 3.3*    Recent Labs Lab 01/14/14 1801  LIPASE 48   No results for input(s): AMMONIA in the last 168 hours. CBC:  Recent Labs Lab 01/14/14 1801 01/15/14 0509  WBC 7.2 7.7  NEUTROABS 4.8 5.2  HGB 13.9 13.5  HCT 40.2 40.5  MCV 97.8 98.1  PLT 168 182   Cardiac Enzymes:  Recent Labs Lab 01/14/14 1801  TROPONINI <0.03   BNP: Invalid input(s): POCBNP CBG:  Recent Labs Lab 01/14/14 2353 01/15/14 0506  GLUCAP 93 92    No results found for this or any previous visit (from the past 240 hour(s)).   Scheduled Meds: . levothyroxine  50 mcg Intravenous Daily  . metoprolol  2.5 mg Intravenous 4 times per day  . piperacillin-tazobactam (ZOSYN)  IV  3.375 g Intravenous Q8H   Continuous Infusions: . dextrose 5 % and 0.9% NaCl 50 mL/hr at 01/15/14 0011

## 2014-01-15 NOTE — Consult Note (Signed)
Urology Consult  Consulting MD: Harlow Asa  CC: Blood in urine  HPI: This is a 80 year old male with multiple medical issues including hypertension, coronary artery disease, spinal stenosis, hypercholesterolemia who was admitted to the hospital yesterday with a diverticular abscess. The patient, over a month ago, began experiencing urinary frequency, urgency, urgency incontinence and gross hematuria. The patient is on Xarelto. He was not having significant fever at the time, but did have abdominal discomfort that was relieved by having a bowel movement. His symptoms got better back then on a course of Cipro. He had recurrence of his bleeding as well as lower urinary tract symptomatology recently. Abdominal CT was performed yesterday which revealed a thickened bladder wall superiorly, a diverticular abscess superior to the bladder as well as significant diverticulosis. No other significant urologic issues were noted other than a large prostate. He does have a long-term history of a slow stream, but has not needed any urologic intervention in the past.  PMH: Past Medical History  Diagnosis Date  . Dyslipidemia   . HTN (hypertension)   . Obesity   . CAD (coronary artery disease)     Silent MI sometime prior to 2000. S/P cath in 2000 showing a total mid RCA with collaterals from the LCX. EF is 40 to50%; Last Myoview in 2010 showing inferior scar without ischemia. EF was 52%.  . Increased liver enzymes     felt to be a fatty liver per ultrasound in 2011  . Dyspnea   . Ischemic cardiomyopathy   . Spinal stenosis     PSH: Past Surgical History  Procedure Laterality Date  . Total hip arthroplasty    . Eye surgery    . Lower back surgery      spinal stenosis  . Shoulder surgery    . Cardioversion N/A 06/06/2012    Procedure: CARDIOVERSION;  Surgeon: Thayer Headings, MD;  Location: [facility] ENDOSCOPY;  Service: Cardiovascular;  Laterality: N/A;    Allergies: No Known  Allergies  Medications: Prescriptions prior to admission  Medication Sig Dispense Refill Last Dose  . clobetasol cream (TEMOVATE) 9.48 % Apply 1 application topically daily as needed (dry skin.). As directed   Past Month at Unknown time  . diphenhydrAMINE (BENADRYL) 25 mg capsule Take 25 mg by mouth at bedtime as needed for allergies or sleep.    01/13/2014 at Unknown time  . doxazosin (CARDURA) 8 MG tablet Take 8 mg by mouth at bedtime.   01/13/2014 at Unknown time  . ezetimibe (ZETIA) 10 MG tablet Take 10 mg by mouth daily.   01/14/2014 at Unknown time  . furosemide (LASIX) 40 MG tablet Take 40 mg by mouth as needed for fluid or edema.    Past Month at Unknown time  . gabapentin (NEURONTIN) 600 MG tablet Take 300 mg by mouth at bedtime.    Past Week at Unknown time  . levothyroxine (SYNTHROID, LEVOTHROID) 100 MCG tablet Take 100 mcg by mouth daily.  12 01/14/2014 at Unknown time  . lisinopril (PRINIVIL,ZESTRIL) 10 MG tablet Take 1 (10 mg) tablet daily 90 tablet 0 01/14/2014 at Unknown time  . metoprolol succinate (TOPROL-XL) 50 MG 24 hr tablet Take one tablet by mouth every morning, take one-half tablet every evening. Take with or immediately following a meal. (Patient taking differently: Take 50 mg by mouth daily. ) 45 tablet 11 01/14/2014 at 0800  . Multiple Vitamin (MULTIVITAMIN) tablet Take 1 tablet by mouth daily.   01/14/2014 at Unknown time  .  naproxen sodium (ANAPROX) 220 MG tablet Take 220 mg by mouth at bedtime.   01/13/2014 at Unknown time  . tetracycline (ACHROMYCIN,SUMYCIN) 250 MG capsule Take 250 mg by mouth daily.   01/14/2014 at Unknown time  . XARELTO 20 MG TABS tablet TAKE 1 TABLET BY MOUTH DAILY WITH SUPPER. 30 tablet 5 01/13/2014 at Unknown time     Social History: History   Social History  . Marital Status: Married    Spouse Name: N/A    Number of Children: 2  . Years of Education: N/A   Occupational History  . retired    Social History Main Topics  . Smoking  status: Never Smoker   . Smokeless tobacco: Never Used  . Alcohol Use: Yes     Comment: 2-4 glasses of wine daily  . Drug Use: No  . Sexual Activity: No   Other Topics Concern  . Not on file   Social History Narrative    Family History: Family History  Problem Relation Age of Onset  . Stroke Father   . Heart attack Father   . Hypertension Father   . Diabetes Father   . Drug abuse Mother   . Hypertension Brother   . Hypertension Brother   . Hypertension Brother   . Hypertension Sister     Review of Systems: Positive: Gross hematuria, dysuria, frequency, urgency, slow stream at times. No pneumaturia. Negative:   A further 10 point review of systems was negative except what is listed in the HPI.  Physical Exam: @VITALS2 @ General: No acute distress.  Awake. Head:  Normocephalic.  Atraumatic. ENT:  EOMI.  Mucous membranes moist Neck:  Supple.  No lymphadenopathy. CV:  S1 present. S2 present. Regular rate. Pulmonary: Equal effort bilaterally.  Clear to auscultation bilaterally. Abdomen: Soft.  Non- tender to palpation. Significant umbilical hernia present, nontender. Skin:  Normal turgor.  No visible rash. Extremity: No gross deformity of bilateral upper extremities.  No gross deformity of    bilateral lower extremities. Neurologic: Alert. Appropriate mood. Rectal:  Normal sphincter tone. Gland approximate 50-60 g in size, minimal asymmetry. No rectal masses. Penis:  circumcised.  No lesions. Urethra:   Orthotopic meatus. Scrotum: No lesions.  No ecchymosis.  No erythema. Testicles: Descended bilaterally.  No masses bilaterally. Epididymis: Palpable bilaterally.  Non Tender to palpation.  Studies:  Recent Labs     01/14/14  1801  01/15/14  0509  HGB  13.9  13.5  WBC  7.2  7.7  PLT  168  182    Recent Labs     01/14/14  1801  01/15/14  0509  NA  134*  134*  K  3.9  4.1  CL  102  101  CO2  23  25  BUN  16  17  CREATININE  1.10  1.41*  CALCIUM  8.9  8.4   GFRNONAA  62*  46*  GFRAA  72*  53*     Recent Labs     01/14/14  2054  01/15/14  0509  INR  1.43  1.52*     Invalid input(s): ABG  I reviewed the patient's CT scan images. There is some thickening of the bladder wall superiorly. There is no evidence of nondependent gas within the bladder. Diverticular abscess noted.  Assessment:  1. Gross hematuria. Most likely related to bladder wall changes/inflammation secondary to the extravesical process going on i.e. diverticular abscess. Currently, his urine is clear  2. Diverticulitis with supravesical abscess, patient currently  on antibiotics  Plan: 1. At this point, I don't think urgent cystoscopy is needed, but if surgery is intended, this can be done before that. Otherwise, with him having quiescent urinary symptomatology, this can be done as an outpatient  2. I discussed pathophysiology of the patient's gross hematuria with him and his family.  3. I'll follow during his hospitalization and make sure he has proper follow-up once he is discharged.  Cc: Dr.  Harlow Asa    Pager:[phone]

## 2014-01-15 NOTE — Progress Notes (Signed)
RT placed pt on auto titrate CPAP 5-20cmH2O on room air via ffm. Sterile water was added for humidification. Pt states he is comfortable and is tolerating CPAP well at this time. RT will continue to monitor as needed.

## 2014-01-16 LAB — GLUCOSE, CAPILLARY
GLUCOSE-CAPILLARY: 114 mg/dL — AB (ref 70–99)
Glucose-Capillary: 105 mg/dL — ABNORMAL HIGH (ref 70–99)
Glucose-Capillary: 132 mg/dL — ABNORMAL HIGH (ref 70–99)
Glucose-Capillary: 105 mg/dL — ABNORMAL HIGH (ref 70–99)

## 2014-01-16 LAB — CLOSTRIDIUM DIFFICILE BY PCR: CDIFFPCR: NEGATIVE

## 2014-01-16 MED ORDER — ZOLPIDEM TARTRATE 5 MG PO TABS
5.0000 mg | ORAL_TABLET | Freq: Every evening | ORAL | Status: DC | PRN
Start: 2014-01-16 — End: 2014-01-19
  Administered 2014-01-16 – 2014-01-18 (×3): 5 mg via ORAL
  Filled 2014-01-16 (×3): qty 1

## 2014-01-16 NOTE — Progress Notes (Signed)
Subjective: Patient reports diarrhea. He is having no abdominal pain.  Objective: Vital signs in last 24 hours: Temp:  [97.8 F (36.6 C)-98.1 F (36.7 C)] 98 F (36.7 C) (01/01 0450) Pulse Rate:  [80-99] 99 (01/01 0450) Resp:  [16-18] 18 (01/01 0450) BP: (139-174)/(62-89) 157/87 mmHg (01/01 0450) SpO2:  [97 %-98 %] 97 % (01/01 0450) Weight:  [101 kg (222 lb 10.6 oz)] 101 kg (222 lb 10.6 oz) (01/01 0450)  Intake/Output from previous day: 12/31 0701 - 01/01 0700 In: 650 [I.V.:600; IV Piggyback:50] Out: 1750 [Urine:1750] Intake/Output this shift:    Physical Exam:  Constitutional: Vital signs reviewed. WD WN in NAD   Eyes: PERRL, No scleral icterus.   Pulmonary/Chest: Normal effort Abdominal: Soft. Non-tender, non-distended, bowel sounds are normal, no masses, organomegaly, or guarding present.    Lab Results:  Recent Labs  01/14/14 1801 01/15/14 0509  HGB 13.9 13.5  HCT 40.2 40.5   BMET  Recent Labs  01/14/14 1801 01/15/14 0509  NA 134* 134*  K 3.9 4.1  CL 102 101  CO2 23 25  GLUCOSE 102* 103*  BUN 16 17  CREATININE 1.10 1.41*  CALCIUM 8.9 8.4    Recent Labs  01/14/14 2054 01/15/14 0509  INR 1.43 1.52*   No results for input(s): LABURIN in the last 72 hours. No results found for this or any previous visit.  Studies/Results: Ct Abdomen Pelvis W Wo Contrast  01/14/2014   CLINICAL DATA:  Gross and microscopic hematuria. Low back and left flank pain. Lower abdominal pain, low grade fever.  EXAM: CT ABDOMEN AND PELVIS WITHOUT AND WITH CONTRAST  TECHNIQUE: Multidetector CT imaging of the abdomen and pelvis was performed following the standard protocol before and following the bolus administration of intravenous contrast.  CONTRAST:  161mL OMNIPAQUE IOHEXOL 300 MG/ML  SOLN  COMPARISON:  None.  FINDINGS: Lower chest: Lung bases show no acute findings. Extensive 3 vessel coronary artery calcification. Heart is at the upper limits of normal in size. No  pericardial effusion.  Hepatobiliary: Liver is unremarkable. Stones are seen in the gallbladder. No biliary ductal dilatation.  Pancreas: A calcification is seen in the uncinate process of the pancreas, indicative of prior pancreatitis. Pancreas is otherwise unremarkable.  Spleen: Negative.  Adrenals/Urinary Tract: Adrenal glands are unremarkable. Stones are seen in the kidneys bilaterally. Renal cortical scarring, left greater than right. Ureters are decompressed. Opacified portions of the ureters are unremarkable. Bladder is grossly unremarkable but somewhat obscured by streak artifact from a left hip arthroplasty.  Stomach/Bowel: Stomach, small bowel, appendix and majority the colon are unremarkable. There are inflammatory changes about the sigmoid colon with a localized collection of adjacent fluid and air, measuring 2.6 x 4.8 cm (series 4, image 65). It is seen along the superior margin of the bladder, without air in the bladder to indicate a fistula.  Vascular/Lymphatic: Atherosclerotic calcification of the arterial vasculature without abdominal aortic aneurysm. No pathologically enlarged lymph nodes.  Reproductive: Prostate is slightly enlarged.  Other: No free fluid. Fat containing periumbilical hernia measures 5.5 x 8.8 cm with the neck measuring 3.4 cm. Mesenteries and peritoneum are otherwise unremarkable.  Musculoskeletal: No worrisome lytic or sclerotic lesions. Left hip arthroplasty. Degenerative changes are seen in the spine.  IMPRESSION: 1. Sigmoid diverticulitis with localized microperforation and abscess. 2. Bilateral renal stones. No additional findings to explain the patient's hematuria. 3. Extensive 3 vessel coronary artery calcification. 4. Cholelithiasis. 5. Large fat containing periumbilical hernia.   Electronically Signed   By:  Lorin Picket M.D.   On: 01/14/2014 15:43    Assessment/Plan:   Diverticular abscess above the bladder with history of hematuria, now resolving. Patient seems  to begin better on antibiotic management, but he is having diarrhea.    I've spoken with the patient about follow-up. I would recommend that he follow-up with me following discharge for outpatient cystoscopy. Presently, I will sign off. Please contact me if further inpatient assistance needed.   LOS: 2 days   Franchot Gallo M 01/16/2014, 7:23 AM

## 2014-01-16 NOTE — Progress Notes (Addendum)
Patient ID: Harold Jennings, male   DOB: March 06, 1932, 81 y.o.   MRN: [id number] [facility] PROGRESS NOTE  Harold Jennings QAS:[id number] DOB: 14-Aug-1932 DOA: 01/14/2014 PCP: Criselda Peaches, MD  Brief narrative:    81 y.o. male with history of chronic atrial fibrillation on Xarelto, chronic systolic heart failure (last EF measured was 40-45% in August 2015), OSA on CPAP, CAD, hypertension who presented to [facility] ED with persistent abdominal pain for past 1 month prior to this admission. Patient reported pain to be mostly in left lower quadrant and periumbilical area. Patient reported pain is worse with bowel movement and he has noticed some blood in the urine last couple of days prior to this admission. No associated nausea vomiting or diarrhea. Patient was recently on antibiotics for urinary tract infection. Patient was found to have sigmoid diverticulitis with microperforation and abscess on this admission. He was started on Zosyn. Surgery on board.  Assessment/Plan:    Principal Problem: Abdominal pain in the setting of sigmoid diverticulitis with microperforation and abscess / diarrhea  CT abdomen on this admission showed sigmoid diverticulitis with microperforation and abscess.  Patient started on Zosyn. Per surgery, patient will get another 1-2 days of IV antibiotics and then home on PO abx.  Pt tolerated clear liquid diet this am.  Had diarrhea so stool sent for C.diff but this was negative. Started imodium.   Active Problems: Essential hypertension  Continue metoprolol 2.5 mg IV every 6 hours.   OSA (obstructive sleep apnea)  Continue CPAP at bedtime   Chronic atrial fibrillation  Anticoagulation was on hold in case surgery or procedure required. Since so far no plan for surgery we will resume anticoagulation.  Rate controlled with metoprolol 2.5 mg IV every 6 hours.  Chronic systolic heart failure   Compensated at this time. Patient had 2-D echo in August 2015 with  ejection fraction 40-45%.  Hypothyroidism  Continue Synthroid 50 g daily IV.   DVT Prophylaxis   SCDs bilaterally  Code Status: Full.  Family Communication:  plan of care discussed with the patient Disposition Plan: Home when stable.   IV access:  Peripheral IV  Procedures and diagnostic studies:    Ct Abdomen Pelvis W Wo Contrast 01/14/2014  1. Sigmoid diverticulitis with localized microperforation and abscess. 2. Bilateral renal stones. No additional findings to explain the patient's hematuria. 3. Extensive 3 vessel coronary artery calcification. 4. Cholelithiasis. 5. Large fat containing periumbilical hernia.      Medical Consultants:  Surgery   Other Consultants:  None   IAnti-Infectives:   Zosyn 01/14/2014 -->  Leisa Lenz, MD  [facility] Pager [phone]  If 7PM-7AM, please contact night-coverage [email or web address] Password [email or web address] 01/16/2014, 11:19 AM   LOS: 2 days    HPI/Subjective: No acute overnight events.  Objective: Filed Vitals:   01/15/14 0458 01/15/14 1400 01/15/14 2055 01/16/14 0450  BP: 126/83 139/62 174/89 157/87  Pulse: 97 80 96 99  Temp: 98.5 F (36.9 C) 97.8 F (36.6 C) 98.1 F (36.7 C) 98 F (36.7 C)  TempSrc: Oral Oral Oral Oral  Resp: 18 16 18 18   Height:      Weight:    101 kg (222 lb 10.6 oz)  SpO2: 97% 98% 98% 97%    Intake/Output Summary (Last 24 hours) at 01/16/14 1119 Last data filed at 01/16/14 0542  Gross per 24 hour  Intake    400 ml  Output   1550 ml  Net  -1150 ml    Exam:  General:  Pt is alert, follows commands appropriately, not in acute distress  Cardiovascular: Regular rate and rhythm, S1/S2, no murmurs  Respiratory: Clear to auscultation bilaterally, no wheezing, no crackles, no rhonchi  Abdomen: Soft, non tender, non distended, bowel sounds present  Extremities: No edema, pulses DP and PT palpable bilaterally  Neuro: Grossly nonfocal  Data Reviewed: Basic Metabolic Panel:  Recent  Labs Lab 01/14/14 1801 01/15/14 0509  NA 134* 134*  K 3.9 4.1  CL 102 101  CO2 23 25  GLUCOSE 102* 103*  BUN 16 17  CREATININE 1.10 1.41*  CALCIUM 8.9 8.4   Liver Function Tests:  Recent Labs Lab 01/14/14 1801 01/15/14 0509  AST 31 30  ALT 25 22  ALKPHOS 148* 136*  BILITOT 1.1 1.8*  PROT 7.0 6.5  ALBUMIN 3.7 3.3*    Recent Labs Lab 01/14/14 1801  LIPASE 48   No results for input(s): AMMONIA in the last 168 hours. CBC:  Recent Labs Lab 01/14/14 1801 01/15/14 0509  WBC 7.2 7.7  NEUTROABS 4.8 5.2  HGB 13.9 13.5  HCT 40.2 40.5  MCV 97.8 98.1  PLT 168 182   Cardiac Enzymes:  Recent Labs Lab 01/14/14 1801  TROPONINI <0.03   BNP: Invalid input(s): POCBNP CBG:  Recent Labs Lab 01/15/14 1140 01/15/14 1544 01/15/14 1818 01/15/14 2356 01/16/14 0655  GLUCAP 125* 119* 106* 111* 105*    No results found for this or any previous visit (from the past 240 hour(s)).   Scheduled Meds: . levothyroxine  50 mcg Intravenous Daily  . metoprolol  2.5 mg Intravenous 4 times per day  . piperacillin-tazobactam (ZOSYN)  IV  3.375 g Intravenous Q8H

## 2014-01-16 NOTE — Progress Notes (Signed)
Patient ID: Harold Jennings, male   DOB: 1932/09/15, 81 y.o.   MRN: [id number]    Subjective: Head diarrhea last night and this morning. Very minimal lower abdominal discomfort. Tolerating fluids.  Objective: Vital signs in last 24 hours: Temp:  [97.8 F (36.6 C)-98.1 F (36.7 C)] 98 F (36.7 C) (01/01 0450) Pulse Rate:  [80-99] 99 (01/01 0450) Resp:  [16-18] 18 (01/01 0450) BP: (139-174)/(62-89) 157/87 mmHg (01/01 0450) SpO2:  [97 %-98 %] 97 % (01/01 0450) Weight:  [222 lb 10.6 oz (101 kg)] 222 lb 10.6 oz (101 kg) (01/01 0450) Last BM Date: 01/14/14  Intake/Output from previous day: 12/31 0701 - 01/01 0700 In: 650 [I.V.:600; IV Piggyback:50] Out: 9379 [Urine:1750] Intake/Output this shift:    General appearance: alert, cooperative, no distress and moderately obese GI: obese. Soft chronically incarcerated umbilical hernia. No appreciable abdominal tenderness.  Lab Results:   Recent Labs  01/14/14 1801 01/15/14 0509  WBC 7.2 7.7  HGB 13.9 13.5  HCT 40.2 40.5  PLT 168 182   BMET  Recent Labs  01/14/14 1801 01/15/14 0509  NA 134* 134*  K 3.9 4.1  CL 102 101  CO2 23 25  GLUCOSE 102* 103*  BUN 16 17  CREATININE 1.10 1.41*  CALCIUM 8.9 8.4  Micro: No results found for this or any previous visit (from the past 240 hour(s)).   Studies/Results: Ct Abdomen Pelvis W Wo Contrast  01/14/2014   CLINICAL DATA:  Gross and microscopic hematuria. Low back and left flank pain. Lower abdominal pain, low grade fever.  EXAM: CT ABDOMEN AND PELVIS WITHOUT AND WITH CONTRAST  TECHNIQUE: Multidetector CT imaging of the abdomen and pelvis was performed following the standard protocol before and following the bolus administration of intravenous contrast.  CONTRAST:  165mL OMNIPAQUE IOHEXOL 300 MG/ML  SOLN  COMPARISON:  None.  FINDINGS: Lower chest: Lung bases show no acute findings. Extensive 3 vessel coronary artery calcification. Heart is at the upper limits of normal in size. No  pericardial effusion.  Hepatobiliary: Liver is unremarkable. Stones are seen in the gallbladder. No biliary ductal dilatation.  Pancreas: A calcification is seen in the uncinate process of the pancreas, indicative of prior pancreatitis. Pancreas is otherwise unremarkable.  Spleen: Negative.  Adrenals/Urinary Tract: Adrenal glands are unremarkable. Stones are seen in the kidneys bilaterally. Renal cortical scarring, left greater than right. Ureters are decompressed. Opacified portions of the ureters are unremarkable. Bladder is grossly unremarkable but somewhat obscured by streak artifact from a left hip arthroplasty.  Stomach/Bowel: Stomach, small bowel, appendix and majority the colon are unremarkable. There are inflammatory changes about the sigmoid colon with a localized collection of adjacent fluid and air, measuring 2.6 x 4.8 cm (series 4, image 65). It is seen along the superior margin of the bladder, without air in the bladder to indicate a fistula.  Vascular/Lymphatic: Atherosclerotic calcification of the arterial vasculature without abdominal aortic aneurysm. No pathologically enlarged lymph nodes.  Reproductive: Prostate is slightly enlarged.  Other: No free fluid. Fat containing periumbilical hernia measures 5.5 x 8.8 cm with the neck measuring 3.4 cm. Mesenteries and peritoneum are otherwise unremarkable.  Musculoskeletal: No worrisome lytic or sclerotic lesions. Left hip arthroplasty. Degenerative changes are seen in the spine.  IMPRESSION: 1. Sigmoid diverticulitis with localized microperforation and abscess. 2. Bilateral renal stones. No additional findings to explain the patient's hematuria. 3. Extensive 3 vessel coronary artery calcification. 4. Cholelithiasis. 5. Large fat containing periumbilical hernia.   Electronically Signed   By:  Lorin Picket M.D.   On: 01/14/2014 15:43    Anti-infectives: Anti-infectives    Start     Dose/Rate Route Frequency Ordered Stop   01/15/14 0400   piperacillin-tazobactam (ZOSYN) IVPB 3.375 g     3.375 g12.5 mL/hr over 240 Minutes Intravenous Every 8 hours 01/14/14 2350     01/14/14 1830  piperacillin-tazobactam (ZOSYN) IVPB 3.375 g     3.375 g100 mL/hr over 30 Minutes Intravenous  Once 01/14/14 1816 01/14/14 2033      Assessment/Plan: Diverticulitis with microperforation and small abscess. He is afebrile with normal white count and no significant tenderness on IV antibiotics. New-onset of diarrhea. Will check for C. Difficile.     LOS: 2 days    Delenn Ahn T 01/16/2014

## 2014-01-17 DIAGNOSIS — K651 Peritoneal abscess: Secondary | ICD-10-CM

## 2014-01-17 LAB — BASIC METABOLIC PANEL
ANION GAP: 11 (ref 5–15)
BUN: 12 mg/dL (ref 6–23)
CALCIUM: 8.7 mg/dL (ref 8.4–10.5)
CO2: 24 mmol/L (ref 19–32)
Chloride: 102 mEq/L (ref 96–112)
Creatinine, Ser: 1.91 mg/dL — ABNORMAL HIGH (ref 0.50–1.35)
GFR calc Af Amer: 37 mL/min — ABNORMAL LOW (ref >90)
GFR, EST NON AFRICAN AMERICAN: 32 mL/min — AB (ref >90)
Glucose, Bld: 116 mg/dL — ABNORMAL HIGH (ref 70–99)
POTASSIUM: 4 mmol/L (ref 3.5–5.1)
SODIUM: 137 mmol/L (ref 135–145)

## 2014-01-17 LAB — GLUCOSE, CAPILLARY
GLUCOSE-CAPILLARY: 100 mg/dL — AB (ref 70–99)
Glucose-Capillary: 98 mg/dL (ref 70–99)
Glucose-Capillary: 102 mg/dL — ABNORMAL HIGH (ref 70–99)

## 2014-01-17 MED ORDER — GUAIFENESIN 100 MG/5ML PO SOLN
400.0000 mg | Freq: Four times a day (QID) | ORAL | Status: DC | PRN
  Administered 2014-01-17 – 2014-01-18 (×4): 400 mg via ORAL
  Administered 2014-01-18 (×2): 200 mg via ORAL
  Filled 2014-01-17 (×4): qty 20
  Filled 2014-01-17: qty 10
  Filled 2014-01-17: qty 20

## 2014-01-17 MED ORDER — RIVAROXABAN 15 MG PO TABS
15.0000 mg | ORAL_TABLET | Freq: Every day | ORAL | Status: DC
  Administered 2014-01-17 – 2014-01-18 (×2): 15 mg via ORAL
  Filled 2014-01-17 (×3): qty 1

## 2014-01-17 MED ORDER — RIVAROXABAN 20 MG PO TABS: 20.0000 mg | ORAL_TABLET | Freq: Every day | ORAL | Status: DC

## 2014-01-17 MED ORDER — LOPERAMIDE HCL 1 MG/5ML PO LIQD
2.0000 mg | Freq: Four times a day (QID) | ORAL | Status: DC | PRN
  Administered 2014-01-17 – 2014-01-18 (×2): 2 mg via ORAL
  Filled 2014-01-17 (×5): qty 10

## 2014-01-17 NOTE — Plan of Care (Signed)
Problem: Phase I Progression Outcomes Goal: OOB as tolerated unless otherwise ordered Outcome: Progressing Ambulating in room; hindered by frequent BM in a.m. but improving in pm.

## 2014-01-17 NOTE — Progress Notes (Addendum)
SBP ( 171/98) continues to be elevated despite prn meds. Will call MD on call. MLynch okayed early admin of 2400 dose Metoprolol. And recheck BP in 1 hr. Will f/u.

## 2014-01-17 NOTE — Progress Notes (Signed)
Patient ID: Harold Jennings, male   DOB: [birth year], 81 y.o.   MRN: [id number]    Subjective: He has some cough and a little congestion. Still with diarrhea. He denies any abdominal pain.  Objective: Vital signs in last 24 hours: Temp:  [97.7 F (36.5 C)-98.6 F (37 C)] 98.6 F (37 C) (01/02 0551) Pulse Rate:  [63-107] 63 (01/02 0551) Resp:  [18] 18 (01/02 0551) BP: (158-166)/(83-96) 158/87 mmHg (01/02 0551) SpO2:  [93 %-96 %] 96 % (01/02 0551) Weight:  [214 lb 8.1 oz (97.3 kg)] 214 lb 8.1 oz (97.3 kg) (01/02 0551) Last BM Date: 01/14/14  Intake/Output from previous day: 01/01 0701 - 01/02 0700 In: 1040 [P.O.:940; IV Piggyback:100] Out: 800 [Urine:800] Intake/Output this shift:    General appearance: alert, cooperative, no distress and moderately obese Resp: no rales or wheezing GI: obese, soft nontender umbilical hernia, no tenderness to deep palpation or guarding or mass  Lab Results:   Recent Labs  01/14/14 1801 01/15/14 0509  WBC 7.2 7.7  HGB 13.9 13.5  HCT 40.2 40.5  PLT 168 182   BMET  Recent Labs  01/14/14 1801 01/15/14 0509  NA 134* 134*  K 3.9 4.1  CL 102 101  CO2 23 25  GLUCOSE 102* 103*  BUN 16 17  CREATININE 1.10 1.41*  CALCIUM 8.9 8.4     Studies/Results: No results found.  Anti-infectives: Anti-infectives    Start     Dose/Rate Route Frequency Ordered Stop   01/15/14 0400  piperacillin-tazobactam (ZOSYN) IVPB 3.375 g     3.375 g12.5 mL/hr over 240 Minutes Intravenous Every 8 hours 01/14/14 2350     01/14/14 1830  piperacillin-tazobactam (ZOSYN) IVPB 3.375 g     3.375 g100 mL/hr over 30 Minutes Intravenous  Once 01/14/14 1816 01/14/14 2033      Assessment/Plan: Sigmoid diverticulitis with microperforation. He has no clinical evidence of problems, nontender, no pain, normal white count and afebrile. Has some diarrhea, possibly secondary to antibiotics, C. Difficile negative, Imodium ordered.I would consider IV antibiotics for another day  or 2 then discharged with follow-up course of oral antibiotics and follow-up outpatient CT scan. He will need outpatient cystoscopy as well. No indication for urgent surgery.     LOS: 3 days    Nyazia Canevari T 01/17/2014

## 2014-01-18 LAB — GLUCOSE, CAPILLARY
GLUCOSE-CAPILLARY: 117 mg/dL — AB (ref 70–99)
GLUCOSE-CAPILLARY: 83 mg/dL (ref 70–99)
GLUCOSE-CAPILLARY: 87 mg/dL (ref 70–99)
Glucose-Capillary: 103 mg/dL — ABNORMAL HIGH (ref 70–99)
Glucose-Capillary: 133 mg/dL — ABNORMAL HIGH (ref 70–99)

## 2014-01-18 MED ORDER — LEVOTHYROXINE SODIUM 100 MCG PO TABS
100.0000 ug | ORAL_TABLET | Freq: Every day | ORAL | Status: DC
  Filled 2014-01-18 (×2): qty 1

## 2014-01-18 MED ORDER — GABAPENTIN 600 MG PO TABS
300.0000 mg | ORAL_TABLET | Freq: Every day | ORAL | Status: DC
  Filled 2014-01-18: qty 0.5

## 2014-01-18 MED ORDER — AMOXICILLIN-POT CLAVULANATE 875-125 MG PO TABS: 1.0000 | ORAL_TABLET | Freq: Two times a day (BID) | ORAL | Status: DC

## 2014-01-18 MED ORDER — RIVAROXABAN 15 MG PO TABS: 15.0000 mg | ORAL_TABLET | Freq: Every day | ORAL | Status: DC

## 2014-01-18 MED ORDER — METRONIDAZOLE 500 MG PO TABS: 500.0000 mg | ORAL_TABLET | Freq: Three times a day (TID) | ORAL | Status: DC

## 2014-01-18 MED ORDER — GABAPENTIN 300 MG PO CAPS
300.0000 mg | ORAL_CAPSULE | Freq: Every day | ORAL | Status: DC
  Administered 2014-01-18: 300 mg via ORAL
  Filled 2014-01-18 (×2): qty 1

## 2014-01-18 MED ORDER — LEVOTHYROXINE SODIUM 100 MCG PO TABS
100.0000 ug | ORAL_TABLET | Freq: Every day | ORAL | Status: DC
  Administered 2014-01-19: 100 ug via ORAL
  Filled 2014-01-18 (×2): qty 1

## 2014-01-18 MED ORDER — LOPERAMIDE HCL 1 MG/5ML PO LIQD: 2.0000 mg | Freq: Four times a day (QID) | ORAL | Status: AC | PRN

## 2014-01-18 MED ORDER — EZETIMIBE 10 MG PO TABS
10.0000 mg | ORAL_TABLET | Freq: Every day | ORAL | Status: DC
  Administered 2014-01-18: 10 mg via ORAL
  Filled 2014-01-18 (×2): qty 1

## 2014-01-18 MED ORDER — METOPROLOL SUCCINATE ER 50 MG PO TB24
50.0000 mg | ORAL_TABLET | Freq: Every day | ORAL | Status: DC
  Administered 2014-01-18 – 2014-01-19 (×2): 50 mg via ORAL
  Filled 2014-01-18 (×2): qty 1

## 2014-01-18 MED ORDER — GUAIFENESIN 100 MG/5ML PO SOLN: 400.0000 mg | Freq: Four times a day (QID) | ORAL | Status: DC | PRN

## 2014-01-18 NOTE — Progress Notes (Signed)
Re:  D/C plans.  RN entered pt's room to advise him (and spouse) that d/c orders had been written.   Pt's spouse became very upset and said, "I can't take him home today.  He's worse [physically/functionally] than when he came in.  I need a potty chair because he won't be able to walk to my bathroom.  I need home health. I've got steps he has to get up to get into the house."  Spouse also verbalizing concerns about pt's high blood pressure, diarrhea "which has made him weak," and having "only a [clear] liquid diet."  Contacted Dr. Charlies Silvers who spoke with pt's spouse.  RN spoke with Dr. Charlies Silvers who ordered PT/OT consult and regular diet and said she was cancelling D/C today orders.  RN noted pt had increased difficulty with bed to [facility] transfers today as compared with yesterday and appeared to be more unstable (requiring min assist versus SBA yesterday).  Notified SW [place] of plan for PT/OT and of spouse's concerns regarding d/c needs.  Advised spouse that SW/CM would be in touch with her after pt's functional status and recommendations determined by PT/OT so that d/c plans could be discussed with more information available.  Pt/spouse in agreement with revised POC.

## 2014-01-18 NOTE — Progress Notes (Signed)
Patient ID: Harold Jennings, male   DOB: 20-May-1932, 81 y.o.   MRN: [id number] [facility] PROGRESS NOTE  Harold Jennings GXQ:[id number] DOB: November 21, 1932 DOA: 01/14/2014 PCP: Criselda Peaches, MD  Brief narrative:    81 y.o. male with history of chronic atrial fibrillation on Xarelto, chronic systolic heart failure (last EF measured was 40-45% in August 2015), OSA on CPAP, CAD, hypertension who presented to [facility] ED with persistent abdominal pain for past 1 month prior to this admission. Patient reported pain to be mostly in left lower quadrant and periumbilical area. Patient reported pain is worse with bowel movement and he has noticed some blood in the urine last couple of days prior to this admission. No associated nausea vomiting or diarrhea. Patient was recently on antibiotics for urinary tract infection. Patient was found to have sigmoid diverticulitis with microperforation and abscess on this admission. He was started on Zosyn. Surgery on board.  Assessment/Plan:    Principal Problem: Abdominal pain in the setting of sigmoid diverticulitis with microperforation and abscess / diarrhea  CT abdomen on this admission showed sigmoid diverticulitis with microperforation and abscess.  Patient started on Zosyn. Per surgery, patient ok for discharge. Patient's wife concerned however about patient physical deconditioning and asking for SNF placement.  Will continue IV zosyn for now, hope he can go to SNF in am.  Diet as tolerated   Had diarrhea so stool sent for C.diff but this was negative. Started imodium which helped controlled diarrhea.  Active Problems: Essential hypertension  Change to PO metoprolol.  OSA (obstructive sleep apnea)  Continue CPAP at bedtime   Chronic atrial fibrillation  Anticoagulation was on hold in case surgery or procedure required. Since so far no plan for surgery we resumed anticoagulation.  Rate controlled with metoprolol   Chronic systolic heart  failure   Compensated at this time. Patient had 2-D echo in August 2015 with ejection fraction 40-45%.  Hypothyroidism  Continue Synthroid, change to PO.   DVT Prophylaxis   SCDs bilaterally  Code Status: Full.  Family Communication:  plan of care discussed with the patient Disposition Plan: needs SNF placement.  IV access:  Peripheral IV  Procedures and diagnostic studies:    Ct Abdomen Pelvis W Wo Contrast 01/14/2014  1. Sigmoid diverticulitis with localized microperforation and abscess. 2. Bilateral renal stones. No additional findings to explain the patient's hematuria. 3. Extensive 3 vessel coronary artery calcification. 4. Cholelithiasis. 5. Large fat containing periumbilical hernia.      Medical Consultants:  Surgery   Other Consultants:  Physical therapy   IAnti-Infectives:   Zosyn 01/14/2014 -->   Leisa Lenz, MD  [facility] Pager [phone]  If 7PM-7AM, please contact night-coverage [email or web address] Password [email or web address] 01/18/2014, 3:11 PM   LOS: 4 days    HPI/Subjective: No acute overnight events.  Objective: Filed Vitals:   01/17/14 2312 01/18/14 0630 01/18/14 1259 01/18/14 1441  BP: 161/96 153/77 166/89 162/101  Pulse:  99 110 63  Temp:  98.2 F (36.8 C) 98 F (36.7 C)   TempSrc:  Oral Oral   Resp:  16 16   Height:      Weight:      SpO2:  94% 97% 97%    Intake/Output Summary (Last 24 hours) at 01/18/14 1511 Last data filed at 01/18/14 1441  Gross per 24 hour  Intake   3510 ml  Output   1050 ml  Net   2460 ml    Exam:   General:  Pt  is alert, follows commands appropriately, not in acute distress  Cardiovascular: Regular rate and rhythm, S1/S2 (+)  Respiratory: Clear to auscultation bilaterally, no wheezing, no crackles, no rhonchi  Abdomen: non distended, bowel sounds present  Extremities: No edema, pulses DP and PT palpable bilaterally  Neuro: Grossly nonfocal  Data Reviewed: Basic Metabolic Panel:  Recent Labs Lab  01/14/14 1801 01/15/14 0509 01/17/14 0904  NA 134* 134* 137  K 3.9 4.1 4.0  CL 102 101 102  CO2 23 25 24   GLUCOSE 102* 103* 116*  BUN 16 17 12   CREATININE 1.10 1.41* 1.91*  CALCIUM 8.9 8.4 8.7   Liver Function Tests:  Recent Labs Lab 01/14/14 1801 01/15/14 0509  AST 31 30  ALT 25 22  ALKPHOS 148* 136*  BILITOT 1.1 1.8*  PROT 7.0 6.5  ALBUMIN 3.7 3.3*    Recent Labs Lab 01/14/14 1801  LIPASE 48   No results for input(s): AMMONIA in the last 168 hours. CBC:  Recent Labs Lab 01/14/14 1801 01/15/14 0509  WBC 7.2 7.7  NEUTROABS 4.8 5.2  HGB 13.9 13.5  HCT 40.2 40.5  MCV 97.8 98.1  PLT 168 182   Cardiac Enzymes:  Recent Labs Lab 01/14/14 1801  TROPONINI <0.03   BNP: Invalid input(s): POCBNP CBG:  Recent Labs Lab 01/17/14 1230 01/17/14 1810 01/17/14 2358 01/18/14 0631 01/18/14 1215  GLUCAP 102* 100* 103* 83 87    Recent Results (from the past 240 hour(s))  Culture, blood (routine x 2)     Status: None (Preliminary result)   Collection Time: 01/14/14  6:38 PM  Result Value Ref Range Status   Specimen Description BLOOD RIGHT ARM  Final   Special Requests BOTTLES DRAWN AEROBIC ONLY 3CC  Final   Culture   Final           BLOOD CULTURE RECEIVED NO GROWTH TO DATE CULTURE WILL BE HELD FOR 5 DAYS BEFORE ISSUING A FINAL NEGATIVE REPORT Performed at [facility]    Report Status PENDING  Incomplete  Culture, blood (routine x 2)     Status: None (Preliminary result)   Collection Time: 01/14/14  6:55 PM  Result Value Ref Range Status   Specimen Description BLOOD RIGHT HAND  Final   Special Requests BOTTLES DRAWN AEROBIC AND ANAEROBIC 2CC  Final   Culture   Final           BLOOD CULTURE RECEIVED NO GROWTH TO DATE CULTURE WILL BE HELD FOR 5 DAYS BEFORE ISSUING A FINAL NEGATIVE REPORT Performed at [facility]    Report Status PENDING  Incomplete  Clostridium Difficile by PCR     Status: None   Collection Time: 01/16/14  9:05 AM   Result Value Ref Range Status   C difficile by pcr NEGATIVE NEGATIVE Final    Comment: Performed at [facility]     Scheduled Meds: . levothyroxine  50 mcg Intravenous Daily  . metoprolol  2.5 mg Intravenous 4 times per day  . piperacillin-tazobactam (ZOSYN)  IV  3.375 g Intravenous Q8H  . rivaroxaban  15 mg Oral Q supper   Continuous Infusions:

## 2014-01-18 NOTE — Progress Notes (Signed)
Pt placed on CPAP QHS.  Pt using his mask and tubing from home with our machine.  Machine set on automode with min-5 and max-20.  Humidifier filled with sterile water and machine plugged into red outlet.  Pt comfortable and stable.

## 2014-01-18 NOTE — Plan of Care (Signed)
Problem: Phase I Progression Outcomes Goal: OOB as tolerated unless otherwise ordered Outcome: Progressing Able to sit up in chair for lunch.  Able to amb to chair with min assist holding IV pole.  PT/OT consult ordered.  Problem: Phase II Progression Outcomes Goal: Vital signs remain stable Outcome: Not Progressing Tachycardia.  HTN.  Receiving IV metoprolol.  Advised Dr. Charlies Silvers of BP/HR this afternoon.

## 2014-01-18 NOTE — Discharge Instructions (Signed)

## 2014-01-18 NOTE — Progress Notes (Signed)
Patient ID: Harold Jennings, male   DOB: Aug 16, 1932, 81 y.o.   MRN: [id number]    Subjective: No abdominal pain or GI complaints.  Objective: Vital signs in last 24 hours: Temp:  [98.2 F (36.8 C)-98.4 F (36.9 C)] 98.2 F (36.8 C) (01/03 0630) Pulse Rate:  [66-119] 99 (01/03 0630) Resp:  [16-18] 16 (01/03 0630) BP: (148-180)/(77-118) 153/77 mmHg (01/03 0630) SpO2:  [94 %-98 %] 94 % (01/03 0630) Last BM Date: 01/17/14  Intake/Output from previous day: 01/02 0701 - 01/03 0700 In: 3510 [P.O.:360; I.V.:3000; IV Piggyback:150] Out: 825 [Urine:825] Intake/Output this shift:    General appearance: alert, cooperative and no distress GI: normal findings: soft, non-tender and moderately obese with soft nontender  umbilical hernia  Lab Results:  No results for input(s): WBC, HGB, HCT, PLT in the last 72 hours. BMET  Recent Labs  01/17/14 0904  NA 137  K 4.0  CL 102  CO2 24  GLUCOSE 116*  BUN 12  CREATININE 1.91*  CALCIUM 8.7     Studies/Results: No results found.  Anti-infectives: Anti-infectives    Start     Dose/Rate Route Frequency Ordered Stop   01/15/14 0400  piperacillin-tazobactam (ZOSYN) IVPB 3.375 g     3.375 g12.5 mL/hr over 240 Minutes Intravenous Every 8 hours 01/14/14 2350     01/14/14 1830  piperacillin-tazobactam (ZOSYN) IVPB 3.375 g     3.375 g100 mL/hr over 30 Minutes Intravenous  Once 01/14/14 1816 01/14/14 2033      Assessment/Plan: Diverticulitis with small pericolonic abscess adjacent to bladder. No pain or tenderness, fever or white count for 48 hours. I think he would be fine for discharge with oral antibiotics for approximately one week and follow-up CT scan and cystoscopy.    LOS: 4 days    Merwin Breden T 01/18/2014

## 2014-01-19 LAB — BASIC METABOLIC PANEL
Anion gap: 11 (ref 5–15)
BUN: 13 mg/dL (ref 6–23)
CALCIUM: 8.7 mg/dL (ref 8.4–10.5)
CO2: 23 mmol/L (ref 19–32)
Chloride: 104 mEq/L (ref 96–112)
Creatinine, Ser: 1.79 mg/dL — ABNORMAL HIGH (ref 0.50–1.35)
GFR calc Af Amer: 40 mL/min — ABNORMAL LOW (ref >90)
GFR, EST NON AFRICAN AMERICAN: 34 mL/min — AB (ref >90)
Glucose, Bld: 105 mg/dL — ABNORMAL HIGH (ref 70–99)
Potassium: 3.6 mmol/L (ref 3.5–5.1)
Sodium: 138 mmol/L (ref 135–145)

## 2014-01-19 LAB — GLUCOSE, CAPILLARY
Glucose-Capillary: 108 mg/dL — ABNORMAL HIGH (ref 70–99)
Glucose-Capillary: 178 mg/dL — ABNORMAL HIGH (ref 70–99)

## 2014-01-19 LAB — CBC
HCT: 39.6 % (ref 39.0–52.0)
Hemoglobin: 13.7 g/dL (ref 13.0–17.0)
MCH: 33.7 pg (ref 26.0–34.0)
MCHC: 34.6 g/dL (ref 30.0–36.0)
MCV: 97.5 fL (ref 78.0–100.0)
Platelets: 166 10*3/uL (ref 150–400)
RBC: 4.06 MIL/uL — ABNORMAL LOW (ref 4.22–5.81)
RDW: 14.9 % (ref 11.5–15.5)
WBC: 7.7 10*3/uL (ref 4.0–10.5)

## 2014-01-19 MED ORDER — AMOXICILLIN-POT CLAVULANATE 875-125 MG PO TABS: 1.0000 | ORAL_TABLET | Freq: Two times a day (BID) | ORAL | Status: DC

## 2014-01-19 MED ORDER — SACCHAROMYCES BOULARDII 250 MG PO CAPS
250.0000 mg | ORAL_CAPSULE | Freq: Two times a day (BID) | ORAL | Status: DC
  Administered 2014-01-19: 250 mg via ORAL
  Filled 2014-01-19 (×2): qty 1

## 2014-01-19 MED ORDER — SACCHAROMYCES BOULARDII 250 MG PO CAPS: 250.0000 mg | ORAL_CAPSULE | Freq: Two times a day (BID) | ORAL | Status: DC

## 2014-01-19 MED ORDER — AMOXICILLIN-POT CLAVULANATE 875-125 MG PO TABS
1.0000 | ORAL_TABLET | Freq: Two times a day (BID) | ORAL | Status: DC
  Administered 2014-01-19: 1 via ORAL
  Filled 2014-01-19 (×2): qty 1

## 2014-01-19 NOTE — Evaluation (Signed)
Occupational Therapy Evaluation Patient Details Name: Harold Jennings MRN: [id number] DOB: 1932/07/09 Today's Date: 01/19/2014    History of Present Illness Harold Jennings is a 81 y.o. male with history of chronic atrial fibrillation on Xarelto, chronic systolic heart failure last EF measured was 40-45% in August 2015, OSA on C Pap, CAD, hypertension presents to the ER because of persistent abdominal pain over the last 1 month since Thanksgiving. Patient's pain is mostly around the left lower quadrant and infraumbilical area. Pain increases on bowel movement. Patient also has noticed some blood in his urine last couple of days. Denies any nausea vomiting or diarrhea. Patient was recently on antibiotics for UTI 3 weeks ago and patient states he finished a ten-day course of antibiotics. At that time his abdominal pain was better. Denies any fever chills chest pain shortness of breath. CT abdomen and pelvis done in the ER shows sigmoid diverticulitis with microperforation and abscess.   Clinical Impression   Patient evaluated by Occupational Therapy with no further acute OT needs identified. All education has been completed and the patient has no further questions. All education completed. Pt requires supervision for BADLs See below for any follow-up Occupational Therapy or equipment needs. OT is signing off. Thank you for this referral.      Follow Up Recommendations  Supervision/Assistance - 24 hour    Equipment Recommendations  None recommended by OT    Recommendations for Other Services       Precautions / Restrictions Precautions Precautions: None Restrictions Weight Bearing Restrictions: No      Mobility Bed Mobility Overal bed mobility: Modified Independent             General bed mobility comments: Pt up in recliner upon arrival  Transfers Overall transfer level: Needs assistance Equipment used: Rolling walker (2 wheeled) Transfers: Sit to/from [employer]  Transfers Sit to Stand: Supervision Stand pivot transfers: Supervision       General transfer comment: Pt typically sits on couch.  Practiced sit to stand fro chair with no arms to simulate couch     Balance Overall balance assessment: Needs assistance Sitting-balance support: Feet supported Sitting balance-[name] Scale: Good       Standing balance-[name] Scale: Fair                              ADL Overall ADL's : Needs assistance/impaired Eating/Feeding: Independent   Grooming: Wash/dry hands;Wash/dry face;Oral care;Applying deodorant;Brushing hair;Supervision/safety;Standing   Upper Body Bathing: Set up;Sitting   Lower Body Bathing: Supervison/ safety;Sit to/from stand   Upper Body Dressing : Set up;Sitting   Lower Body Dressing: Supervision/safety;Sit to/from stand   Toilet Transfer: Supervision/safety;Ambulation;RW   Toileting- Clothing Manipulation and Hygiene: Supervision/safety;Sit to/from stand   Tub/ Shower Transfer: Supervision/safety;Ambulation;3 in 1;Rolling walker Tub/Shower Transfer Details (indicate cue type and reason): Discussed options for grab bars  Functional mobility during ADLs: Supervision/safety;Rolling walker General ADL Comments: Pt eager to discharge home.  Discussed options for shower seats, use of LH back brush (which he has) if he progresses to standing to shower.  Also discussed use of basket or bag on walker to transport items.  Pt has sock aide, but doesn't use.  Also discussed safety with introducing dog to pt once he returns home.      Vision                     Perception     Praxis  Pertinent Vitals/Pain Pain Assessment: No/denies pain     Hand Dominance     Extremity/Trunk Assessment Upper Extremity Assessment Upper Extremity Assessment: Overall WFL for tasks assessed   Lower Extremity Assessment Lower Extremity Assessment: Defer to PT evaluation   Cervical / Trunk Assessment Cervical / Trunk  Assessment: Normal   Communication Communication Communication: No difficulties   Cognition Arousal/Alertness: Awake/alert Behavior During Therapy: WFL for tasks assessed/performed Overall Cognitive Status: Within Functional Limits for tasks assessed                     General Comments       Exercises       Shoulder Instructions      Home Living Family/patient expects to be discharged to:: Private residence Living Arrangements: Alone   Type of Home: House Home Access: Stairs to enter [employer] of Steps: 3-4 Entrance Stairs-Rails: Left;Right Home Layout: Two level;Able to live on main level with bedroom/bathroom     Bathroom Shower/Tub: Occupational psychologist: Standard     Home Equipment: Environmental consultant - 4 wheels;Cane - single point;Bedside commode          Prior Functioning/Environment Level of Independence: Independent        Comments: occasional use of cane or rollator due to spinal stenosis    OT Diagnosis: Generalized weakness   OT Problem List:     OT Treatment/Interventions:      OT Goals(Current goals can be found in the care plan section) Acute Rehab OT Goals Patient Stated Goal: to go home  OT Goal Formulation: All assessment and education complete, DC therapy  OT Frequency:     Barriers to D/C:            Co-evaluation              End of Session Equipment Utilized During Treatment: Rolling walker Nurse Communication: Mobility status  Activity Tolerance: Patient tolerated treatment well Patient left: in chair;with call bell/phone within reach;with family/visitor present   Time: 1328-1406 OT Time Calculation (min): 38 min Charges:  OT General Charges $OT Visit: 1 Procedure OT Evaluation $Initial OT Evaluation Tier I: 1 Procedure OT Treatments $Self Care/Home Management : 23-37 mins G-Codes:    Shatia Sindoni M 02/11/14, 2:17 PM

## 2014-01-19 NOTE — Progress Notes (Signed)
Discharge instructions and prescriptions reviewed with patient and wife utilizing teach back method. Patient discharged to home.

## 2014-01-19 NOTE — Progress Notes (Signed)
CSW met with pt / spouse this am to assist with d/c planning. PN reviewed. PT recommends HHPT following hospital d/c. Pt / spouse are in agreement with this plan. RNCM is assisting with d/c planning needs. CSW signing off.  Werner Lean LCSW [phone]

## 2014-01-19 NOTE — Evaluation (Signed)
Physical Therapy Evaluation Patient Details Name: Harold Jennings MRN: [id number] DOB: 21-Feb-1932 Today's Date: 01/19/2014   History of Present Illness  Harold Jennings is a 81 y.o. male with history of chronic atrial fibrillation on Xarelto, chronic systolic heart failure last EF measured was 40-45% in August 2015, OSA on C Pap, CAD, hypertension presents to the ER because of persistent abdominal pain over the last 1 month since Thanksgiving. Patient's pain is mostly around the left lower quadrant and infraumbilical area. Pain increases on bowel movement. Patient also has noticed some blood in his urine last couple of days. Denies any nausea vomiting or diarrhea. Patient was recently on antibiotics for UTI 3 weeks ago and patient states he finished a ten-day course of antibiotics. At that time his abdominal pain was better. Denies any fever chills chest pain shortness of breath. CT abdomen and pelvis done in the ER shows sigmoid diverticulitis with microperforation and abscess.  Clinical Impression  Pt admitted with above diagnosis. Pt currently with functional limitations due to the deficits listed below (see PT Problem List).  Pt will benefit from skilled PT to increase their independence and safety with mobility to allow discharge to the venue listed below.  Pt was able to walk in hallway with RW and MIN/guard.  Discussed home environment and ways to make transfers easier at home for pt.  Pt needs 3-1 BSC for home use and recommend HHPT and they are also interested in [facility] at this time, although acute OT eval is pending.     Follow Up Recommendations Home health PT    Equipment Recommendations  3in1 (PT)    Recommendations for Other Services       Precautions / Restrictions Precautions Precautions: None Restrictions Weight Bearing Restrictions: No      Mobility  Bed Mobility               General bed mobility comments: Pt up in recliner upon arrival  Transfers Overall  transfer level: Needs assistance Equipment used: Rolling walker (2 wheeled) Transfers: Sit to/from Stand Sit to Stand: Min guard         General transfer comment: cues to scoot to edge of chair and for proper hand placement  Ambulation/Gait Ambulation/Gait assistance: Min guard Ambulation Distance (Feet): 200 Feet Assistive device: Rolling walker (2 wheeled) Gait Pattern/deviations: Step-through pattern     General Gait Details: Pt with reliance on RW and not able to attempt without.  Pt with occasional stops and not a steady cadence, but no LOB  Stairs Stairs: Yes Stairs assistance: Min guard Stair Management: One rail Right;Step to pattern;Alternating pattern Number of Stairs: 4 General stair comments: Pt performed with step to and step through pattern with 1 rail with wife observing  Wheelchair Mobility    Modified Rankin (Stroke Patients Only)       Balance Overall balance assessment: Needs assistance   Sitting balance-[name] Scale: Good       Standing balance-[name] Scale: Fair                               Pertinent Vitals/Pain Pain Assessment: No/denies pain    Home Living Family/patient expects to be discharged to:: Private residence Living Arrangements: Alone   Type of Home: House Home Access: Stairs to enter Entrance Stairs-Rails: Chemical engineer of Steps: 3-4 Home Layout: Two level;Able to live on main level with bedroom/bathroom Home Equipment: Walker - 4 wheels;Cane -  single point      Prior Function Level of Independence: Independent         Comments: occasional use of cane or rollator due to spinal stenosis     Hand Dominance        Extremity/Trunk Assessment   Upper Extremity Assessment: Defer to OT evaluation           Lower Extremity Assessment: Overall WFL for tasks assessed      Cervical / Trunk Assessment: Normal  Communication   Communication: No difficulties  Cognition  Arousal/Alertness: Awake/alert Behavior During Therapy: WFL for tasks assessed/performed Overall Cognitive Status: Within Functional Limits for tasks assessed                      General Comments General comments (skin integrity, edema, etc.): Wife very supportive and discussed d/c plans and equipment.    Exercises        Assessment/Plan    PT Assessment Patient needs continued PT services  PT Diagnosis Difficulty walking   PT Problem List Decreased strength;Decreased activity tolerance;Decreased balance;Decreased mobility  PT Treatment Interventions DME instruction;Gait training;Stair training;Functional mobility training;Therapeutic activities;Therapeutic exercise   PT Goals (Current goals can be found in the Care Plan section) Acute Rehab PT Goals Patient Stated Goal: Go home and be as I as he can be while still being safe PT Goal Formulation: With patient/family Time For Goal Achievement: 01/26/14 Potential to Achieve Goals: Good    Frequency Min 3X/week   Barriers to discharge        Co-evaluation               End of Session Equipment Utilized During Treatment: Gait belt Activity Tolerance: Patient tolerated treatment well Patient left: in chair;with family/visitor present           Time: 1012-1041 PT Time Calculation (min) (ACUTE ONLY): 29 min   Charges:   PT Evaluation $Initial PT Evaluation Tier I: 1 Procedure PT Treatments $Gait Training: 8-22 mins   PT G Codes:        Kodey Xue LUBECK 01/19/2014, 11:38 AM

## 2014-01-19 NOTE — Progress Notes (Signed)
  Subjective: He feels better and is asking about going home.  Wife ask about the repeat CT scan.  Objective: Vital signs in last 24 hours: Temp:  [97.7 F (36.5 C)-98.6 F (37 C)] 97.7 F (36.5 C) (01/04 0611) Pulse Rate:  [63-110] 84 (01/04 0611) Resp:  [16] 16 (01/04 0611) BP: (157-167)/(82-101) 163/93 mmHg (01/04 0611) SpO2:  [97 %-98 %] 98 % (01/04 0611) Last BM Date: 01/18/14 480 PO 2 stools Afebrile, BP up some Creatinine is somewhat better Diet:  Regular  Intake/Output from previous day: 01/03 0701 - 01/04 0700 In: 630 [P.O.:480; IV Piggyback:150] Out: 500 [Urine:500] Intake/Output this shift:    General appearance: alert, cooperative and no distress GI: soft large umbilical hernia, + Bs, nontender + BM  Lab Results:   Recent Labs  01/19/14 0900  WBC 7.7  HGB 13.7  HCT 39.6  PLT 166    BMET  Recent Labs  01/17/14 0904 01/19/14 0900  NA 137 138  K 4.0 3.6  CL 102 104  CO2 24 23  GLUCOSE 116* 105*  BUN 12 13  CREATININE 1.91* 1.79*  CALCIUM 8.7 8.7   PT/INR No results for input(s): LABPROT, INR in the last 72 hours.   Recent Labs Lab 01/14/14 1801 01/15/14 0509  AST 31 30  ALT 25 22  ALKPHOS 148* 136*  BILITOT 1.1 1.8*  PROT 7.0 6.5  ALBUMIN 3.7 3.3*     Lipase     Component Value Date/Time   LIPASE 48 01/14/2014 1801     Studies/Results: No results found.  Medications: . ezetimibe  10 mg Oral Daily  . gabapentin  300 mg Oral QHS  . levothyroxine  100 mcg Oral QAC breakfast  . metoprolol succinate  50 mg Oral Daily  . piperacillin-tazobactam (ZOSYN)  IV  3.375 g Intravenous Q8H  . rivaroxaban  15 mg Oral Q supper     Assessment/Plan Diverticulitis with microperforation adjacent the bladder. CAD/ischemic cardiomyopathy/ Hypertension Spinal stenosis Obesity  Sleep apnea with CPAP Renal insuffiencey     Plan:  He has had 5 days of Zosyn, 1st day just one dose.  i would transition to PO antibiotics.  I would not do  the CT scan now with renal function still elevated.  I will leave that to Medicine, but from our standpoint he can go to PO antibiotics and follow up as an outpatient.  Add probiotics also. i will order labs for the AM to see how he does.    LOS: 5 days    Marishka Rentfrow 01/19/2014

## 2014-01-19 NOTE — Discharge Summary (Signed)
Physician Discharge Summary  Harold Jennings HUD:[id number] DOB: [birth year] DOA: 01/14/2014  PCP: Criselda Peaches, MD  Admit date: 01/14/2014 Discharge date: 01/19/2014  Recommendations for Outpatient Follow-up:  1. Take Augmentin for 7 days and flagyl on discharge and follow up with surgery per scheduled appointment. 2. Continue to hold lisinopril as it may worsen kidney function. You may continue metoprolol and lasix for blood pressure control.  Discharge Diagnoses:  Principal Problem:   Sigmoid diverticulitis Active Problems:   Essential hypertension   OSA (obstructive sleep apnea)   Chronic atrial fibrillation   Chronic systolic congestive heart failure   Intra-abdominal abscess    Discharge Condition: stable   Diet recommendation: as tolerated   History of present illness:  81 y.o. male with history of chronic atrial fibrillation on Xarelto, chronic systolic heart failure (last EF measured was 40-45% in August 2015), OSA on CPAP, CAD, hypertension who presented to [facility] ED with persistent abdominal pain for past 1 month prior to this admission. Patient reported pain to be mostly in left lower quadrant and periumbilical area. Patient reported pain is worse with bowel movement and he has noticed some blood in the urine last couple of days prior to this admission. No associated nausea vomiting or diarrhea. Patient was recently on antibiotics for urinary tract infection. Patient was found to have sigmoid diverticulitis with microperforation and abscess on this admission. He was started on Zosyn. Surgery on board.  Assessment/Plan:    Principal Problem: Abdominal pain in the setting of sigmoid diverticulitis with microperforation and abscess / diarrhea  CT abdomen on this admission showed sigmoid diverticulitis with microperforation and abscess.  Patient started on Zosyn. Per surgery, patient ok for discharge.   Patient will continue Augmentin and Flagyl on discharge for 7  days.  Diet as tolerated.  Had diarrhea so stool sent for C.diff but this was negative. Started imodium which helped controlled diarrhea.  Active Problems: Essential hypertension  Changed to PO metoprolol.  OSA (obstructive sleep apnea)  Continue CPAP at bedtime  Chronic atrial fibrillation  Anticoagulation was on hold in case surgery or procedure required. Since so far no plan for surgery we resumed anticoagulation.  Rate controlled with metoprolol  Chronic systolic heart failure   Compensated at this time. Patient had 2-D echo in August 2015 with ejection fraction 40-45%.  Hypothyroidism  Continue Synthroid, changed to PO.   DVT Prophylaxis   SCDs bilaterally  Code Status: Full.  Family Communication: plan of care discussed with the patient   IV access:  Peripheral IV  Procedures and diagnostic studies:   Ct Abdomen Pelvis W Wo Contrast 01/14/2014 1. Sigmoid diverticulitis with localized microperforation and abscess. 2. Bilateral renal stones. No additional findings to explain the patient's hematuria. 3. Extensive 3 vessel coronary artery calcification. 4. Cholelithiasis. 5. Large fat containing periumbilical hernia.   Medical Consultants:  Surgery   Other Consultants:  Physical therapy   IAnti-Infectives:   Zosyn 01/14/2014 --> 01/19/2013  Signed:  Leisa Lenz, MD  [facility] 01/19/2014, 11:30 AM  Pager #: [phone]     Discharge Exam: Filed Vitals:   01/19/14 0611  BP: 163/93  Pulse: 84  Temp: 97.7 F (36.5 C)  Resp: 16   Filed Vitals:   01/18/14 1441 01/18/14 1700 01/18/14 2152 01/19/14 0611  BP: 162/101 157/91 167/82 163/93  Pulse: 63 103 104 84  Temp:   98.6 F (37 C) 97.7 F (36.5 C)  TempSrc:    Oral  Resp:   16  16  Height:      Weight:      SpO2: 97%  97% 98%    General: Pt is alert, follows commands appropriately, not in acute distress Cardiovascular: Regular rate and rhythm, S1/S2  (+) Respiratory: no wheezing, no crackles, no rhonchi Abdominal: Soft, non tender, non distended, bowel sounds +, no guarding Extremities: no edema, no cyanosis, pulses palpable bilaterally DP and PT Neuro: Grossly nonfocal  Discharge Instructions  Discharge Instructions    Call MD for:  difficulty breathing, headache or visual disturbances    Complete by:  As directed      Call MD for:  persistant dizziness or light-headedness    Complete by:  As directed      Call MD for:  persistant nausea and vomiting    Complete by:  As directed      Call MD for:  severe uncontrolled pain    Complete by:  As directed      Diet - low sodium heart healthy    Complete by:  As directed      Diet - low sodium heart healthy    Complete by:  As directed      Discharge instructions    Complete by:  As directed   1. Take Augmentin for 7 days and flagyl on discharge and follow up with surgery per scheduled appointment. 2. Continue to hold lisinopril as it may worsen kidney function. You may continue metoprolol and lasix for blood pressure control.     Increase activity slowly    Complete by:  As directed      Increase activity slowly    Complete by:  As directed             Medication List    STOP taking these medications        lisinopril 10 MG tablet  Commonly known as:  PRINIVIL,ZESTRIL     tetracycline 250 MG capsule  Commonly known as:  ACHROMYCIN,SUMYCIN      TAKE these medications        amoxicillin-clavulanate 875-125 MG per tablet  Commonly known as:  AUGMENTIN  Take 1 tablet by mouth 2 (two) times daily.     amoxicillin-clavulanate 875-125 MG per tablet  Commonly known as:  AUGMENTIN  Take 1 tablet by mouth every 12 (twelve) hours.     clobetasol cream 0.05 %  Commonly known as:  TEMOVATE  Apply 1 application topically daily as needed (dry skin.). As directed     diphenhydrAMINE 25 mg capsule  Commonly known as:  BENADRYL  Take 25 mg by mouth at bedtime as needed for  allergies or sleep.     doxazosin 8 MG tablet  Commonly known as:  CARDURA  Take 8 mg by mouth at bedtime.     ezetimibe 10 MG tablet  Commonly known as:  ZETIA  Take 10 mg by mouth daily.     furosemide 40 MG tablet  Commonly known as:  LASIX  Take 40 mg by mouth as needed for fluid or edema.     gabapentin 600 MG tablet  Commonly known as:  NEURONTIN  Take 300 mg by mouth at bedtime.     guaiFENesin 100 MG/5ML Soln  Commonly known as:  ROBITUSSIN  Take 20 mLs (400 mg total) by mouth every 6 (six) hours as needed for cough.     levothyroxine 100 MCG tablet  Commonly known as:  SYNTHROID, LEVOTHROID  Take 100 mcg by mouth daily.  loperamide 1 MG/5ML solution  Commonly known as:  IMODIUM  Take 10 mLs (2 mg total) by mouth every 6 (six) hours as needed for diarrhea or loose stools.     metoprolol succinate 50 MG 24 hr tablet  Commonly known as:  TOPROL-XL  Take one tablet by mouth every morning, take one-half tablet every evening. Take with or immediately following a meal.     metroNIDAZOLE 500 MG tablet  Commonly known as:  FLAGYL  Take 1 tablet (500 mg total) by mouth 3 (three) times daily.     multivitamin tablet  Take 1 tablet by mouth daily.     naproxen sodium 220 MG tablet  Commonly known as:  ANAPROX  Take 220 mg by mouth at bedtime.     Rivaroxaban 15 MG Tabs tablet  Commonly known as:  XARELTO  Take 1 tablet (15 mg total) by mouth daily with supper.     saccharomyces boulardii 250 MG capsule  Commonly known as:  FLORASTOR  Take 1 capsule (250 mg total) by mouth 2 (two) times daily.           Follow-up Information    Follow up with DAHLSTEDT, Lillette Boxer, MD.   Specialty:  Urology   Why:  Call for appointment following discharge for cystoscopic inspection of bladder   Contact information:   [place] [address] [phone]       Follow up with Excell Seltzer T, MD. Schedule an appointment as soon as possible for a visit in 1  week.   Specialty:  General Surgery   Why:  Follow up appt after recent hospitalization   Contact information:   [address] [place] [address] [phone]        The results of significant diagnostics from this hospitalization (including imaging, microbiology, ancillary and laboratory) are listed below for reference.    Significant Diagnostic Studies: Ct Abdomen Pelvis W Wo Contrast  01/14/2014   CLINICAL DATA:  Gross and microscopic hematuria. Low back and left flank pain. Lower abdominal pain, low grade fever.  EXAM: CT ABDOMEN AND PELVIS WITHOUT AND WITH CONTRAST  TECHNIQUE: Multidetector CT imaging of the abdomen and pelvis was performed following the standard protocol before and following the bolus administration of intravenous contrast.  CONTRAST:  152mL OMNIPAQUE IOHEXOL 300 MG/ML  SOLN  COMPARISON:  None.  FINDINGS: Lower chest: Lung bases show no acute findings. Extensive 3 vessel coronary artery calcification. Heart is at the upper limits of normal in size. No pericardial effusion.  Hepatobiliary: Liver is unremarkable. Stones are seen in the gallbladder. No biliary ductal dilatation.  Pancreas: A calcification is seen in the uncinate process of the pancreas, indicative of prior pancreatitis. Pancreas is otherwise unremarkable.  Spleen: Negative.  Adrenals/Urinary Tract: Adrenal glands are unremarkable. Stones are seen in the kidneys bilaterally. Renal cortical scarring, left greater than right. Ureters are decompressed. Opacified portions of the ureters are unremarkable. Bladder is grossly unremarkable but somewhat obscured by streak artifact from a left hip arthroplasty.  Stomach/Bowel: Stomach, small bowel, appendix and majority the colon are unremarkable. There are inflammatory changes about the sigmoid colon with a localized collection of adjacent fluid and air, measuring 2.6 x 4.8 cm (series 4, image 65). It is seen along the superior margin of the bladder, without  air in the bladder to indicate a fistula.  Vascular/Lymphatic: Atherosclerotic calcification of the arterial vasculature without abdominal aortic aneurysm. No pathologically enlarged lymph nodes.  Reproductive: Prostate  is slightly enlarged.  Other: No free fluid. Fat containing periumbilical hernia measures 5.5 x 8.8 cm with the neck measuring 3.4 cm. Mesenteries and peritoneum are otherwise unremarkable.  Musculoskeletal: No worrisome lytic or sclerotic lesions. Left hip arthroplasty. Degenerative changes are seen in the spine.  IMPRESSION: 1. Sigmoid diverticulitis with localized microperforation and abscess. 2. Bilateral renal stones. No additional findings to explain the patient's hematuria. 3. Extensive 3 vessel coronary artery calcification. 4. Cholelithiasis. 5. Large fat containing periumbilical hernia.   Electronically Signed   By: Lorin Picket M.D.   On: 01/14/2014 15:43    Microbiology: Recent Results (from the past 240 hour(s))  Culture, blood (routine x 2)     Status: None (Preliminary result)   Collection Time: 01/14/14  6:38 PM  Result Value Ref Range Status   Specimen Description BLOOD RIGHT ARM  Final   Special Requests BOTTLES DRAWN AEROBIC ONLY 3CC  Final   Culture   Final           BLOOD CULTURE RECEIVED NO GROWTH TO DATE CULTURE WILL BE HELD FOR 5 DAYS BEFORE ISSUING A FINAL NEGATIVE REPORT Performed at [facility]    Report Status PENDING  Incomplete  Culture, blood (routine x 2)     Status: None (Preliminary result)   Collection Time: 01/14/14  6:55 PM  Result Value Ref Range Status   Specimen Description BLOOD RIGHT HAND  Final   Special Requests BOTTLES DRAWN AEROBIC AND ANAEROBIC 2CC  Final   Culture   Final           BLOOD CULTURE RECEIVED NO GROWTH TO DATE CULTURE WILL BE HELD FOR 5 DAYS BEFORE ISSUING A FINAL NEGATIVE REPORT Performed at [facility]    Report Status PENDING  Incomplete  Clostridium Difficile by PCR     Status: None    Collection Time: 01/16/14  9:05 AM  Result Value Ref Range Status   C difficile by pcr NEGATIVE NEGATIVE Final    Comment: Performed at [place]: Basic Metabolic Panel:  Recent Labs Lab 01/14/14 1801 01/15/14 0509 01/17/14 0904 01/19/14 0900  NA 134* 134* 137 138  K 3.9 4.1 4.0 3.6  CL 102 101 102 104  CO2 23 25 24 23   GLUCOSE 102* 103* 116* 105*  BUN 16 17 12 13   CREATININE 1.10 1.41* 1.91* 1.79*  CALCIUM 8.9 8.4 8.7 8.7   Liver Function Tests:  Recent Labs Lab 01/14/14 1801 01/15/14 0509  AST 31 30  ALT 25 22  ALKPHOS 148* 136*  BILITOT 1.1 1.8*  PROT 7.0 6.5  ALBUMIN 3.7 3.3*    Recent Labs Lab 01/14/14 1801  LIPASE 48   No results for input(s): AMMONIA in the last 168 hours. CBC:  Recent Labs Lab 01/14/14 1801 01/15/14 0509 01/19/14 0900  WBC 7.2 7.7 7.7  NEUTROABS 4.8 5.2  --   HGB 13.9 13.5 13.7  HCT 40.2 40.5 39.6  MCV 97.8 98.1 97.5  PLT 168 182 166   Cardiac Enzymes:  Recent Labs Lab 01/14/14 1801  TROPONINI <0.03   BNP: BNP (last 3 results) No results for input(s): PROBNP in the last 8760 hours. CBG:  Recent Labs Lab 01/18/14 0631 01/18/14 1215 01/18/14 1800 01/18/14 2346 01/19/14 0609  GLUCAP 83 87 133* 117* 108*    Time coordinating discharge: Over 30 minutes

## 2014-01-21 LAB — CULTURE, BLOOD (ROUTINE X 2)
CULTURE: NO GROWTH
Culture: NO GROWTH

## 2014-01-22 ENCOUNTER — Telehealth: Payer: Self-pay | Admitting: *Deleted

## 2014-01-22 ENCOUNTER — Other Ambulatory Visit: Payer: Self-pay | Admitting: Cardiovascular Disease

## 2014-01-22 DIAGNOSIS — I4819 Other persistent atrial fibrillation: Secondary | ICD-10-CM

## 2014-01-22 NOTE — Telephone Encounter (Signed)
Patient was on Xarelto 20mg  in November at his last visit but was changed to 15mg  during his hospital admission in December. His CrCl is 48.7 but he is requesting a refill on the 20mg . Please advise.

## 2014-01-22 NOTE — Telephone Encounter (Signed)
Download 01/12/14 -  on BiPAP 18/14, good control of events, good usage, no changes.

## 2014-01-23 ENCOUNTER — Encounter (HOSPITAL_BASED_OUTPATIENT_CLINIC_OR_DEPARTMENT_OTHER): Payer: Medicare Other | Attending: Internal Medicine

## 2014-01-23 NOTE — Telephone Encounter (Signed)
Pt is aware of download results. 

## 2014-01-24 NOTE — Telephone Encounter (Signed)
Dose changed to 15 mg GFR < 50

## 2014-02-27 ENCOUNTER — Encounter: Payer: Self-pay | Admitting: Gastroenterology

## 2014-03-05 ENCOUNTER — Other Ambulatory Visit (INDEPENDENT_AMBULATORY_CARE_PROVIDER_SITE_OTHER): Payer: Self-pay | Admitting: Surgery

## 2014-03-05 DIAGNOSIS — K5720 Diverticulitis of large intestine with perforation and abscess without bleeding: Secondary | ICD-10-CM

## 2014-03-10 ENCOUNTER — Ambulatory Visit
Admission: RE | Admit: 2014-03-10 | Discharge: 2014-03-10 | Disposition: A | Discharge status: Home / Self Care | Payer: Medicare Other | Source: Ambulatory Visit | Attending: Surgery | Admitting: Surgery

## 2014-03-10 MED ORDER — IOHEXOL 300 MG/ML  SOLN
100.0000 mL | Freq: Once | INTRAMUSCULAR | Status: AC | PRN
  Administered 2014-03-10: 100 mL via INTRAVENOUS

## 2014-04-08 ENCOUNTER — Encounter (HOSPITAL_COMMUNITY): Payer: Self-pay | Admitting: Emergency Medicine

## 2014-04-08 ENCOUNTER — Emergency Department (HOSPITAL_COMMUNITY): Payer: Medicare Other

## 2014-04-08 ENCOUNTER — Inpatient Hospital Stay (HOSPITAL_COMMUNITY)
Admission: EM | Admit: 2014-04-08 | Discharge: 2014-04-11 | DRG: 378 | Disposition: A | Discharge status: Home / Self Care | Payer: Medicare Other | Attending: Internal Medicine | Admitting: Internal Medicine

## 2014-04-08 DIAGNOSIS — K5721 Diverticulitis of large intestine with perforation and abscess with bleeding: Secondary | ICD-10-CM | POA: Diagnosis not present

## 2014-04-08 DIAGNOSIS — K5732 Diverticulitis of large intestine without perforation or abscess without bleeding: Secondary | ICD-10-CM | POA: Diagnosis present

## 2014-04-08 DIAGNOSIS — G4730 Sleep apnea, unspecified: Secondary | ICD-10-CM | POA: Diagnosis present

## 2014-04-08 DIAGNOSIS — I5042 Chronic combined systolic (congestive) and diastolic (congestive) heart failure: Secondary | ICD-10-CM | POA: Diagnosis present

## 2014-04-08 DIAGNOSIS — Z8249 Family history of ischemic heart disease and other diseases of the circulatory system: Secondary | ICD-10-CM

## 2014-04-08 DIAGNOSIS — N321 Vesicointestinal fistula: Secondary | ICD-10-CM | POA: Diagnosis present

## 2014-04-08 DIAGNOSIS — B9689 Other specified bacterial agents as the cause of diseases classified elsewhere: Secondary | ICD-10-CM | POA: Diagnosis present

## 2014-04-08 DIAGNOSIS — I4891 Unspecified atrial fibrillation: Secondary | ICD-10-CM | POA: Diagnosis present

## 2014-04-08 DIAGNOSIS — K5720 Diverticulitis of large intestine with perforation and abscess without bleeding: Secondary | ICD-10-CM

## 2014-04-08 DIAGNOSIS — I255 Ischemic cardiomyopathy: Secondary | ICD-10-CM | POA: Diagnosis present

## 2014-04-08 DIAGNOSIS — K921 Melena: Secondary | ICD-10-CM

## 2014-04-08 DIAGNOSIS — Z96649 Presence of unspecified artificial hip joint: Secondary | ICD-10-CM | POA: Diagnosis present

## 2014-04-08 DIAGNOSIS — Z7901 Long term (current) use of anticoagulants: Secondary | ICD-10-CM

## 2014-04-08 DIAGNOSIS — N390 Urinary tract infection, site not specified: Secondary | ICD-10-CM | POA: Diagnosis present

## 2014-04-08 DIAGNOSIS — I2510 Atherosclerotic heart disease of native coronary artery without angina pectoris: Secondary | ICD-10-CM | POA: Diagnosis present

## 2014-04-08 DIAGNOSIS — I252 Old myocardial infarction: Secondary | ICD-10-CM

## 2014-04-08 DIAGNOSIS — I10 Essential (primary) hypertension: Secondary | ICD-10-CM | POA: Diagnosis present

## 2014-04-08 DIAGNOSIS — Z823 Family history of stroke: Secondary | ICD-10-CM

## 2014-04-08 DIAGNOSIS — K625 Hemorrhage of anus and rectum: Secondary | ICD-10-CM | POA: Diagnosis present

## 2014-04-08 DIAGNOSIS — Z833 Family history of diabetes mellitus: Secondary | ICD-10-CM

## 2014-04-08 DIAGNOSIS — Z6832 Body mass index (BMI) 32.0-32.9, adult: Secondary | ICD-10-CM

## 2014-04-08 DIAGNOSIS — K219 Gastro-esophageal reflux disease without esophagitis: Secondary | ICD-10-CM | POA: Diagnosis present

## 2014-04-08 DIAGNOSIS — L988 Other specified disorders of the skin and subcutaneous tissue: Secondary | ICD-10-CM

## 2014-04-08 DIAGNOSIS — I5032 Chronic diastolic (congestive) heart failure: Secondary | ICD-10-CM | POA: Diagnosis present

## 2014-04-08 DIAGNOSIS — E785 Hyperlipidemia, unspecified: Secondary | ICD-10-CM | POA: Diagnosis present

## 2014-04-08 LAB — URINALYSIS, ROUTINE W REFLEX MICROSCOPIC
BILIRUBIN URINE: NEGATIVE
GLUCOSE, UA: NEGATIVE mg/dL
Ketones, ur: NEGATIVE mg/dL
Nitrite: NEGATIVE
PROTEIN: NEGATIVE mg/dL
Specific Gravity, Urine: 1.016 (ref 1.005–1.030)
Urobilinogen, UA: 0.2 mg/dL (ref 0.0–1.0)
pH: 5.5 (ref 5.0–8.0)

## 2014-04-08 LAB — CBC WITH DIFFERENTIAL/PLATELET
BASOS ABS: 0 10*3/uL (ref 0.0–0.1)
Basophils Relative: 0 % (ref 0–1)
EOS ABS: 0.2 10*3/uL (ref 0.0–0.7)
EOS PCT: 3 % (ref 0–5)
HCT: 34.5 % — ABNORMAL LOW (ref 39.0–52.0)
Hemoglobin: 11.9 g/dL — ABNORMAL LOW (ref 13.0–17.0)
Lymphocytes Relative: 28 % (ref 12–46)
Lymphs Abs: 2 10*3/uL (ref 0.7–4.0)
MCH: 33.3 pg (ref 26.0–34.0)
MCHC: 34.5 g/dL (ref 30.0–36.0)
MCV: 96.6 fL (ref 78.0–100.0)
Monocytes Absolute: 0.7 10*3/uL (ref 0.1–1.0)
Monocytes Relative: 9 % (ref 3–12)
Neutro Abs: 4.3 10*3/uL (ref 1.7–7.7)
Neutrophils Relative %: 60 % (ref 43–77)
PLATELETS: 161 10*3/uL (ref 150–400)
RBC: 3.57 MIL/uL — ABNORMAL LOW (ref 4.22–5.81)
RDW: 16.1 % — ABNORMAL HIGH (ref 11.5–15.5)
WBC: 7.2 10*3/uL (ref 4.0–10.5)

## 2014-04-08 LAB — COMPREHENSIVE METABOLIC PANEL
ALT: 22 U/L (ref 0–53)
AST: 28 U/L (ref 0–37)
Albumin: 3.5 g/dL (ref 3.5–5.2)
Alkaline Phosphatase: 134 U/L — ABNORMAL HIGH (ref 39–117)
Anion gap: 10 (ref 5–15)
BUN: 19 mg/dL (ref 6–23)
CALCIUM: 8.8 mg/dL (ref 8.4–10.5)
CO2: 23 mmol/L (ref 19–32)
CREATININE: 1 mg/dL (ref 0.50–1.35)
Chloride: 103 mmol/L (ref 96–112)
GFR calc non Af Amer: 69 mL/min — ABNORMAL LOW (ref >90)
GFR, EST AFRICAN AMERICAN: 80 mL/min — AB (ref >90)
Glucose, Bld: 119 mg/dL — ABNORMAL HIGH (ref 70–99)
Potassium: 3.7 mmol/L (ref 3.5–5.1)
Sodium: 136 mmol/L (ref 135–145)
Total Bilirubin: 0.9 mg/dL (ref 0.3–1.2)
Total Protein: 6.9 g/dL (ref 6.0–8.3)

## 2014-04-08 LAB — URINE MICROSCOPIC-ADD ON

## 2014-04-08 LAB — TYPE AND SCREEN
ABO/RH(D): B NEG
ANTIBODY SCREEN: NEGATIVE

## 2014-04-08 LAB — LIPASE, BLOOD: Lipase: 51 U/L (ref 11–59)

## 2014-04-08 LAB — ABO/RH: ABO/RH(D): B NEG

## 2014-04-08 LAB — APTT: aPTT: 39 seconds — ABNORMAL HIGH (ref 24–37)

## 2014-04-08 LAB — PROTIME-INR
INR: 1.5 — ABNORMAL HIGH (ref 0.00–1.49)
PROTHROMBIN TIME: 18.3 s — AB (ref 11.6–15.2)

## 2014-04-08 LAB — POC OCCULT BLOOD, ED: Fecal Occult Bld: POSITIVE — AB

## 2014-04-08 MED ORDER — SODIUM CHLORIDE 0.9 % IV BOLUS (SEPSIS)
1000.0000 mL | Freq: Once | INTRAVENOUS | Status: AC
  Administered 2014-04-08: 1000 mL via INTRAVENOUS

## 2014-04-08 MED ORDER — IOHEXOL 300 MG/ML  SOLN
100.0000 mL | Freq: Once | INTRAMUSCULAR | Status: AC | PRN
  Administered 2014-04-08: 100 mL via INTRAVENOUS

## 2014-04-08 MED ORDER — PANTOPRAZOLE SODIUM 40 MG IV SOLR
40.0000 mg | Freq: Once | INTRAVENOUS | Status: AC
  Administered 2014-04-08: 40 mg via INTRAVENOUS
  Filled 2014-04-08: qty 40

## 2014-04-08 MED ORDER — IOHEXOL 300 MG/ML  SOLN
25.0000 mL | Freq: Once | INTRAMUSCULAR | Status: AC | PRN
  Administered 2014-04-08: 25 mL via ORAL

## 2014-04-08 NOTE — ED Provider Notes (Signed)
CSN: [id number]     Arrival date & time 04/08/14  1847 History   First MD Initiated Contact with Patient 04/08/14 2003     Chief Complaint  Patient presents with  . Rectal Bleeding     (Consider location/radiation/quality/duration/timing/severity/associated sxs/prior Treatment) The history is provided by the patient. No language interpreter was used.  SUE MCALEXANDER is a 81 year old male with past medical history of dyslipidemia, hypertension, obesity, coronary artery disease, elevated liver enzymes, ischemic cardiomyopathy presenting to the ED with hematochezia that has been ongoing since yesterday. Patient reported that he had 3 episodes of bloody diarrhea overnight, reported that each bowel movement consisting of bright red blood. Patient reported that the toilet bowl is a bright red after a bowel movement. Reported that he's been feeling weak today. Reported that he took Imodium this morning and has not had a bowel movement since then. Stated that he takes his Xarelto for atrial fibrillation and takes Aleve daily. Reported that he was seen by his primary care provider earlier this evening, Dr. Orland Mustard, who recommended patient to be seen and assessed in ED setting. Denied abdominal pain, nausea, vomiting, melena, fever, chills, dizziness, syncope, chest pain, shortness of breath, difficulty breathing, cough, hemoptysis, hematemesis. PCP Dr. Orland Mustard  Past Medical History  Diagnosis Date  . Dyslipidemia   . HTN (hypertension)   . Obesity   . CAD (coronary artery disease)     Silent MI sometime prior to 2000. S/P cath in 2000 showing a total mid RCA with collaterals from the LCX. EF is 40 to50%; Last Myoview in 2010 showing inferior scar without ischemia. EF was 52%.  . Increased liver enzymes     felt to be a fatty liver per ultrasound in 2011  . Dyspnea   . Ischemic cardiomyopathy   . Spinal stenosis    Past Surgical History  Procedure Laterality Date  . Total hip arthroplasty    . Eye  surgery    . Lower back surgery      spinal stenosis  . Shoulder surgery    . Cardioversion N/A 06/06/2012    Procedure: CARDIOVERSION;  Surgeon: Thayer Headings, MD;  Location: [facility] ENDOSCOPY;  Service: Cardiovascular;  Laterality: N/A;   Family History  Problem Relation Age of Onset  . Stroke Father   . Heart attack Father   . Hypertension Father   . Diabetes Father   . Drug abuse Mother   . Hypertension Brother   . Hypertension Brother   . Hypertension Brother   . Hypertension Sister    History  Substance Use Topics  . Smoking status: Never Smoker   . Smokeless tobacco: Never Used  . Alcohol Use: Yes     Comment: 2-4 glasses of wine daily    Review of Systems  Constitutional: Negative for fever and chills.  Respiratory: Negative for chest tightness and shortness of breath.   Cardiovascular: Negative for chest pain.  Gastrointestinal: Positive for blood in stool. Negative for nausea, vomiting, abdominal pain, diarrhea, constipation and anal bleeding.  Genitourinary: Negative for decreased urine volume.  Neurological: Negative for dizziness, syncope, weakness, numbness and headaches.      Allergies  Review of patient's allergies indicates no known allergies.  Home Medications   Prior to Admission medications   Medication Sig Start Date End Date Taking? Authorizing Provider  diphenhydrAMINE (BENADRYL) 25 mg capsule Take 25 mg by mouth at bedtime.    Yes Historical Provider, MD  doxazosin (CARDURA) 8 MG tablet Take 8  mg by mouth at bedtime.   Yes Historical Provider, MD  ezetimibe (ZETIA) 10 MG tablet Take 10 mg by mouth daily.   Yes Historical Provider, MD  furosemide (LASIX) 40 MG tablet Take 40 mg by mouth as needed for fluid or edema (fluid).  04/29/12  Yes Sherren Mocha, MD  levothyroxine (SYNTHROID, LEVOTHROID) 100 MCG tablet Take 100 mcg by mouth daily. 09/20/13  Yes Historical Provider, MD  loperamide (IMODIUM) 1 MG/5ML solution Take 10 mLs (2 mg total) by mouth  every 6 (six) hours as needed for diarrhea or loose stools. 01/18/14  Yes Robbie Lis, MD  metoprolol succinate (TOPROL-XL) 50 MG 24 hr tablet Take one tablet by mouth every morning, take one-half tablet every evening. Take with or immediately following a meal. Patient taking differently: Take 50 mg by mouth daily.  09/02/13  Yes Sherren Mocha, MD  Multiple Vitamin (MULTIVITAMIN) tablet Take 1 tablet by mouth daily.   Yes Historical Provider, MD  naproxen sodium (ANAPROX) 220 MG tablet Take 220 mg by mouth at bedtime.   Yes Historical Provider, MD  nitrofurantoin, macrocrystal-monohydrate, (MACROBID) 100 MG capsule Take 100 mg by mouth daily.   Yes Historical Provider, MD  Rivaroxaban (XARELTO) 15 MG TABS tablet Take 1 tablet (15 mg total) by mouth daily with supper. 01/18/14  Yes Robbie Lis, MD  Rivaroxaban (XARELTO) 15 MG TABS tablet Take 1 tablet (15 mg total) by mouth daily with supper. 01/24/14  Yes Sherren Mocha, MD  saccharomyces boulardii (FLORASTOR) 250 MG capsule Take 1 capsule (250 mg total) by mouth 2 (two) times daily. Patient taking differently: Take 250 mg by mouth daily.  01/19/14  Yes Robbie Lis, MD  amoxicillin-clavulanate (AUGMENTIN) 875-125 MG per tablet Take 1 tablet by mouth 2 (two) times daily. Patient not taking: Reported on 04/08/2014 01/18/14   Robbie Lis, MD  gabapentin (NEURONTIN) 600 MG tablet Take 300 mg by mouth at bedtime.     Historical Provider, MD  guaiFENesin (ROBITUSSIN) 100 MG/5ML SOLN Take 20 mLs (400 mg total) by mouth every 6 (six) hours as needed for cough. 01/18/14   Robbie Lis, MD  metroNIDAZOLE (FLAGYL) 500 MG tablet Take 1 tablet (500 mg total) by mouth 3 (three) times daily. Patient not taking: Reported on 04/08/2014 01/18/14   Robbie Lis, MD   BP 175/94 mmHg  Pulse 105  Temp(Src) 98.4 F (36.9 C) (Oral)  Resp 20  Ht 5\' 6"  (1.676 m)  Wt 209 lb (94.802 kg)  BMI 33.75 kg/m2  SpO2 96% Physical Exam  Constitutional: He is oriented to person,  place, and time. He appears well-developed and well-nourished. No distress.  HENT:  Head: Normocephalic and atraumatic.  Eyes: Conjunctivae and EOM are normal. Pupils are equal, round, and reactive to light. Right eye exhibits no discharge. Left eye exhibits no discharge.  Neck: Normal range of motion. Neck supple.  Cardiovascular: Normal rate, regular rhythm and normal heart sounds.  Exam reveals no friction rub.   No murmur heard. Pulmonary/Chest: Effort normal and breath sounds normal. No respiratory distress. He has no wheezes. He has no rales.  Abdominal: Soft. Bowel sounds are normal. He exhibits no distension. There is no tenderness. There is no rebound and no guarding.  Umbilical hernia, negative signs of incarceration noted.   Genitourinary:  Rectal Exam: Negative BRBPR. Negative swelling, erythema, inflammation, lesions, sores, deformities noted. Negative hemorrhoids. Bright red blood with stool noted on glove.  Exam chaperoned with tech, Aaron Edelman.  Musculoskeletal:  Normal range of motion.  Neurological: He is alert and oriented to person, place, and time. No cranial nerve deficit. He exhibits normal muscle tone. Coordination normal.  Skin: Skin is warm and dry. No rash noted. He is not diaphoretic. No erythema.  Psychiatric: He has a normal mood and affect. His behavior is normal. Thought content normal.  Nursing note and vitals reviewed.   ED Course  Procedures (including critical care time)  Results for orders placed or performed during the hospital encounter of 04/08/14  Comprehensive metabolic panel  Result Value Ref Range   Sodium 136 135 - 145 mmol/L   Potassium 3.7 3.5 - 5.1 mmol/L   Chloride 103 96 - 112 mmol/L   CO2 23 19 - 32 mmol/L   Glucose, Bld 119 (H) 70 - 99 mg/dL   BUN 19 6 - 23 mg/dL   Creatinine, Ser 1.00 0.50 - 1.35 mg/dL   Calcium 8.8 8.4 - 10.5 mg/dL   Total Protein 6.9 6.0 - 8.3 g/dL   Albumin 3.5 3.5 - 5.2 g/dL   AST 28 0 - 37 U/L   ALT 22 0 - 53  U/L   Alkaline Phosphatase 134 (H) 39 - 117 U/L   Total Bilirubin 0.9 0.3 - 1.2 mg/dL   GFR calc non Af Amer 69 (L) >90 mL/min   GFR calc Af Amer 80 (L) >90 mL/min   Anion gap 10 5 - 15  Lipase, blood  Result Value Ref Range   Lipase 51 11 - 59 U/L  CBC with Differential/Platelet  Result Value Ref Range   WBC 7.2 4.0 - 10.5 K/uL   RBC 3.57 (L) 4.22 - 5.81 MIL/uL   Hemoglobin 11.9 (L) 13.0 - 17.0 g/dL   HCT 34.5 (L) 39.0 - 52.0 %   MCV 96.6 78.0 - 100.0 fL   MCH 33.3 26.0 - 34.0 pg   MCHC 34.5 30.0 - 36.0 g/dL   RDW 16.1 (H) 11.5 - 15.5 %   Platelets 161 150 - 400 K/uL   Neutrophils Relative % 60 43 - 77 %   Neutro Abs 4.3 1.7 - 7.7 K/uL   Lymphocytes Relative 28 12 - 46 %   Lymphs Abs 2.0 0.7 - 4.0 K/uL   Monocytes Relative 9 3 - 12 %   Monocytes Absolute 0.7 0.1 - 1.0 K/uL   Eosinophils Relative 3 0 - 5 %   Eosinophils Absolute 0.2 0.0 - 0.7 K/uL   Basophils Relative 0 0 - 1 %   Basophils Absolute 0.0 0.0 - 0.1 K/uL  Protime-INR  Result Value Ref Range   Prothrombin Time 18.3 (H) 11.6 - 15.2 seconds   INR 1.50 (H) 0.00 - 1.49  APTT  Result Value Ref Range   aPTT 39 (H) 24 - 37 seconds  Urinalysis, Routine w reflex microscopic  Result Value Ref Range   Color, Urine YELLOW YELLOW   APPearance TURBID (A) CLEAR   Specific Gravity, Urine 1.016 1.005 - 1.030   pH 5.5 5.0 - 8.0   Glucose, UA NEGATIVE NEGATIVE mg/dL   Hgb urine dipstick LARGE (A) NEGATIVE   Bilirubin Urine NEGATIVE NEGATIVE   Ketones, ur NEGATIVE NEGATIVE mg/dL   Protein, ur NEGATIVE NEGATIVE mg/dL   Urobilinogen, UA 0.2 0.0 - 1.0 mg/dL   Nitrite NEGATIVE NEGATIVE   Leukocytes, UA LARGE (A) NEGATIVE  Urine microscopic-add on  Result Value Ref Range   WBC, UA TOO NUMEROUS TO COUNT <3 WBC/hpf   RBC / HPF TOO NUMEROUS  TO COUNT <3 RBC/hpf   Bacteria, UA MANY (A) RARE  POC occult blood, ED Provider will collect  Result Value Ref Range   Fecal Occult Bld POSITIVE (A) NEGATIVE  Type and screen  Result  Value Ref Range   ABO/RH(D) B NEG    Antibody Screen NEG    Sample Expiration 04/11/2014   ABO/Rh  Result Value Ref Range   ABO/RH(D) B NEG     Labs Review Labs Reviewed  COMPREHENSIVE METABOLIC PANEL - Abnormal; Notable for the following:    Glucose, Bld 119 (*)    Alkaline Phosphatase 134 (*)    GFR calc non Af Amer 69 (*)    GFR calc Af Amer 80 (*)    All other components within normal limits  CBC WITH DIFFERENTIAL/PLATELET - Abnormal; Notable for the following:    RBC 3.57 (*)    Hemoglobin 11.9 (*)    HCT 34.5 (*)    RDW 16.1 (*)    All other components within normal limits  PROTIME-INR - Abnormal; Notable for the following:    Prothrombin Time 18.3 (*)    INR 1.50 (*)    All other components within normal limits  APTT - Abnormal; Notable for the following:    aPTT 39 (*)    All other components within normal limits  URINALYSIS, ROUTINE W REFLEX MICROSCOPIC - Abnormal; Notable for the following:    APPearance TURBID (*)    Hgb urine dipstick LARGE (*)    Leukocytes, UA LARGE (*)    All other components within normal limits  URINE MICROSCOPIC-ADD ON - Abnormal; Notable for the following:    Bacteria, UA MANY (*)    All other components within normal limits  POC OCCULT BLOOD, ED - Abnormal; Notable for the following:    Fecal Occult Bld POSITIVE (*)    All other components within normal limits  URINE CULTURE  LIPASE, BLOOD  TYPE AND SCREEN  ABO/RH    Imaging Review Ct Abdomen Pelvis W Contrast  04/08/2014   CLINICAL DATA:  Rectal bleeding since last night. History of colonic abscess.  EXAM: CT ABDOMEN AND PELVIS WITH CONTRAST  TECHNIQUE: Multidetector CT imaging of the abdomen and pelvis was performed using the standard protocol following bolus administration of intravenous contrast.  CONTRAST:  130mL OMNIPAQUE IOHEXOL 300 MG/ML SOLN, 75mL OMNIPAQUE IOHEXOL 300 MG/ML SOLN  COMPARISON:  Most recent CT 03/10/2014, CT 01/14/2014 also reviewed  FINDINGS: Mild  cardiomegaly noted at the lung bases. No parenchymal consolidation or pleural effusion.  There is worsening inflammation surrounding sigmoid diverticula with increased inflammation surrounding the previously seen fluid collection. Fluid collection measures 2.5 x 2.2 cm, previously 2.3 x 2.0 cm. There is increasing surrounding inflammatory change and bladder wall thickening. Air is again seen within the urinary bladder with wall thickening adjacent to the fluid collection. No new abscess. Additional extensive noninflamed diverticula throughout the colon are unchanged. No small bowel dilatation. The stomach is decompressed. The appendix is normal.  There are no focal hepatic lesions. Gallstones again noted within physiologically distended gallbladder. Spleen and adrenal glands are normal. Pancreatic atrophy with focal calcification in the pancreatic head, unchanged from prior exam.  There is bilateral renal cortical thinning, left greater than right. Stones are seen within both kidneys with questionable caliceal diverticulum on the left. There is no hydronephrosis. Stone in the distal left ureter is again suspected measuring 6 mm, unchanged from prior, no resultant ureteral dilatation.  Atherosclerotic calcifications without aneurysm. No retroperitoneal  adenopathy. Fat containing periumbilical hernia. Enlarged prostate gland, similar to prior. No pelvic free fluid.  Left hip prosthesis again seen. Degenerative change of the spine and right hip. No acute or suspicious osseous abnormalities.  IMPRESSION: 1. Increased size of the peri-diverticular/colonic fluid collection in the sigmoid colon, currently measures 2.5 x 2.2 cm. The degree of surrounding soft tissue inflammation has increased compared to prior concerning for acute inflammation. 2. 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 The degree of bladder  wall thickening has increased from prior. 3. Additional stable chronic findings include bilateral nephrolithiasis, stone in the distal left ureter, unchanged. Cholelithiasis, fat containing ventral abdominal wall hernia, and atherosclerosis.   Electronically Signed   By: Jeb Levering M.D.   On: 04/08/2014 23:27   Dg Chest Port 1 View  04/08/2014   CLINICAL DATA:  Acute onset of diarrhea and rectal bleeding. Initial encounter.  EXAM: PORTABLE CHEST - 1 VIEW  COMPARISON:  Chest radiograph performed 03/25/2013  FINDINGS: The lungs are well-aerated. Pulmonary vascularity is at the upper limits of normal. Minimal bibasilar atelectasis is noted. There is no evidence of pleural effusion or pneumothorax.  The cardiomediastinal silhouette is borderline enlarged. No acute osseous abnormalities are seen.  IMPRESSION: Minimal bibasilar atelectasis noted.  Borderline cardiomegaly.   Electronically Signed   By: Garald Balding M.D.   On: 04/08/2014 21:25   Dg Abd Portable 1v  04/08/2014   CLINICAL DATA:  Rectal bleeding, history of colonic abscess.  EXAM: PORTABLE ABDOMEN - 1 VIEW  COMPARISON:  CT 03/10/2014  FINDINGS: There is increased air throughout normal caliber small and large bowel loops throughout the abdomen. No bowel dilatation. No evidence of free intra-abdominal air on supine views. There is contrast within multiple diverticula in the left and distal colon from prior CT. Air within the central pelvis may be within the rectum versus air in the bladder, as seen on prior CT. Vascular calcifications are seen. Left hip prosthesis in place.  IMPRESSION: 1. Air-filled normal caliber small and large bowel loops throughout the abdomen. No bowel dilatation to suggest obstruction or evidence of free air. 2. Air in the central pelvis may be within the rectum or urinary bladder as seen on prior CT.   Electronically Signed   By: Jeb Levering M.D.   On: 04/08/2014 21:27     EKG Interpretation None       11:39 PM  This provider spoke with Dr. Marcello Moores, [place]. Discussed case, labs, imaging, ED course in great detail. Recommended admission to medicine. Surgery to consult patient.   12:01 AM This provider spoke with Dr. Marlowe Sax, [facility] hospitalist. Discussed case, labs, imaging, vitals, ED course, surgery recommendation in great detail. As per physician agreed to admission. Agreed to zosyn. Patient to be admitted to the hospital.   MDM   Final diagnoses:  Diverticulitis of large intestine with abscess with bleeding  Fistula  Hematochezia    Medications  piperacillin-tazobactam (ZOSYN) IVPB 3.375 g (not administered)  sodium chloride 0.9 % bolus 1,000 mL (0 mLs Intravenous Stopped 04/08/14 2347)  pantoprazole (PROTONIX) injection 40 mg (40 mg Intravenous Given 04/08/14 2135)  iohexol (OMNIPAQUE) 300 MG/ML solution 25 mL (25 mLs Oral Contrast Given 04/08/14 2249)  iohexol (OMNIPAQUE) 300 MG/ML solution 100 mL (100 mLs Intravenous Contrast Given 04/08/14 2242)    Filed Vitals:   04/08/14 1934 04/08/14 2144 04/08/14 2351  BP: 123/81 154/69 175/94  Pulse: 54 78 105  Temp: 97.5 F (36.4 C) 98.5 F (36.9 C) 98.4 F (36.9 C)  TempSrc: Oral Oral Oral  Resp:  11 20  Height:  5\' 6"  (1.676 m)   Weight:  209 lb (94.802 kg)   SpO2: 99% 96% 96%   This provider reviewed patient's chart. Patient was seen and assessed in ED setting on 01/14/2014 regarding abdominal pain with bloody diarrhea. CT abdomen pelvis with contrast was performed identified sigmoid diverticulitis with microperforation abscess. Patient was admitted to hospital for IV antibiotics. Patient recently had a CT scan of the abdomen with contrast performed on 03/10/2014 that showed reduction of the abscess. CBC noted negative elevated leukocytosis. Hemoglobin 11.9, hematocrit 34.5 - when compared to 2 months ago hemoglobin was 13.7, hematocrit 39.6. CMP noted mildly elevated alkaline phosphatase of 134. Glucose 118 with-negative elevated anion gap. Lipase  negative elevation. PT INR elevated, INR 1.5. Fecal occult positive. Urinalysis noted large hemoglobin with large leukocytes with a white blood cell count and a better blood cell count to numerous to count. Portable chest x-ray noted minimal bibasilar atelectasis, borderline cardiomegaly. Abdominal plain film, portable, noted air-filled normal-caliber small and large bowels throughout the abdomen with no sign of dilation to suggest obstruction or evidence of free air. Air in the central pelvis may be within the rectum or urinary bladder as seen on prior CT. CT abdomen and pelvis with contrast identified increased size of her a diverticular/colonic fluid collection in the sigmoid colon measuring approximate 2.5 x 2.2 cm. Soft tissue inflammation is increased compared to prior CT, acute infection identified. Air in the urinary bladder with associated bladder wall thickening adjacent to colonic fluid collection concerning for colovesicular fistula, bladder wall thickening has increased. Patient presenting to the ED with colonic inflammation with increased fluid collection and colovesicular fistula. Patient started on IV fluids, IV Protonix and IV antibiotics. Surgery consulted and will come and assess patient. Patient to be admitted to medicine. Discussed plan for admission with patient is in accordance with plan. Patient stable for transfer to floor.  Jamse Mead, PA-C 04/09/14 0023

## 2014-04-08 NOTE — ED Provider Notes (Signed)
Medical screening examination/treatment/procedure(s) were conducted as a shared visit with non-physician practitioner(s) and myself.  I personally evaluated the patient during the encounter.   EKG Interpretation None     Patient here with rectal bleeding abdominal pain. Abdominal CT shows possible colovesicular fistula. General surgery consult and patient will be admitted to the medicine service  Lacretia Leigh, MD 04/08/14 [phone]

## 2014-04-08 NOTE — ED Notes (Signed)
Pt states that he started having rectal bleeding last night. Hx of colon abcess. Dr. Orland Mustard sent him here to discern if this is diverticulitis related or something else. Alert and oriented.

## 2014-04-08 NOTE — Consult Note (Signed)
Reason for Consult:diverticular abscess Referring Physician: Dr Harold Jennings is an 81 y.o. male.  HPI: Patient presents to the office with several days of BRBPR.  Denies fevers or abd pain.  Pt has a h/o hospitalization in Jan for a diverticular abscess.  It was elected to attempt medical management.  He states that he had some pneumaturia about a month ago, but denies any current symptoms.    Past Medical History  Diagnosis Date  . Dyslipidemia   . HTN (hypertension)   . Obesity   . CAD (coronary artery disease)     Silent MI sometime prior to 2000. S/P cath in 2000 showing a total mid RCA with collaterals from the LCX. EF is 40 to50%; Last Myoview in 2010 showing inferior scar without ischemia. EF was 52%.  . Increased liver enzymes     felt to be a fatty liver per ultrasound in 2011  . Dyspnea   . Ischemic cardiomyopathy   . Spinal stenosis     Past Surgical History  Procedure Laterality Date  . Total hip arthroplasty    . Eye surgery    . Lower back surgery      spinal stenosis  . Shoulder surgery    . Cardioversion N/A 06/06/2012    Procedure: CARDIOVERSION;  Surgeon: Harold Headings, MD;  Location: [facility] ENDOSCOPY;  Service: Cardiovascular;  Laterality: N/A;    Family History  Problem Relation Age of Onset  . Stroke Father   . Heart attack Father   . Hypertension Father   . Diabetes Father   . Drug abuse Mother   . Hypertension Brother   . Hypertension Brother   . Hypertension Brother   . Hypertension Sister     Social History:  reports that he has never smoked. He has never used smokeless tobacco. He reports that he drinks alcohol. He reports that he does not use illicit drugs.  Allergies: No Known Allergies  Medications: I have reviewed the patient's current medications.  Results for orders placed or performed during the hospital encounter of 04/08/14 (from the past 48 hour(s))  Comprehensive metabolic panel     Status: Abnormal   Collection Time:  04/08/14  8:23 PM  Result Value Ref Range   Sodium 136 135 - 145 mmol/L   Potassium 3.7 3.5 - 5.1 mmol/L   Chloride 103 96 - 112 mmol/L   CO2 23 19 - 32 mmol/L   Glucose, Bld 119 (H) 70 - 99 mg/dL   BUN 19 6 - 23 mg/dL   Creatinine, Ser 1.00 0.50 - 1.35 mg/dL   Calcium 8.8 8.4 - 10.5 mg/dL   Total Protein 6.9 6.0 - 8.3 g/dL   Albumin 3.5 3.5 - 5.2 g/dL   AST 28 0 - 37 U/L   ALT 22 0 - 53 U/L   Alkaline Phosphatase 134 (H) 39 - 117 U/L   Total Bilirubin 0.9 0.3 - 1.2 mg/dL   GFR calc non Af Amer 69 (L) >90 mL/min   GFR calc Af Amer 80 (L) >90 mL/min    Comment: (NOTE) The eGFR has been calculated using the CKD EPI equation. This calculation has not been validated in all clinical situations. eGFR's persistently <90 mL/min signify possible Chronic Kidney Disease.    Anion gap 10 5 - 15  Type and screen     Status: None   Collection Time: 04/08/14  8:23 PM  Result Value Ref Range   ABO/RH(D) B NEG  Antibody Screen NEG    Sample Expiration 04/11/2014   Lipase, blood     Status: None   Collection Time: 04/08/14  8:23 PM  Result Value Ref Range   Lipase 51 11 - 59 U/L  CBC with Differential/Platelet     Status: Abnormal   Collection Time: 04/08/14  8:23 PM  Result Value Ref Range   WBC 7.2 4.0 - 10.5 K/uL   RBC 3.57 (L) 4.22 - 5.81 MIL/uL   Hemoglobin 11.9 (L) 13.0 - 17.0 g/dL   HCT 34.5 (L) 39.0 - 52.0 %   MCV 96.6 78.0 - 100.0 fL   MCH 33.3 26.0 - 34.0 pg   MCHC 34.5 30.0 - 36.0 g/dL   RDW 16.1 (H) 11.5 - 15.5 %   Platelets 161 150 - 400 K/uL   Neutrophils Relative % 60 43 - 77 %   Neutro Abs 4.3 1.7 - 7.7 K/uL   Lymphocytes Relative 28 12 - 46 %   Lymphs Abs 2.0 0.7 - 4.0 K/uL   Monocytes Relative 9 3 - 12 %   Monocytes Absolute 0.7 0.1 - 1.0 K/uL   Eosinophils Relative 3 0 - 5 %   Eosinophils Absolute 0.2 0.0 - 0.7 K/uL   Basophils Relative 0 0 - 1 %   Basophils Absolute 0.0 0.0 - 0.1 K/uL  Protime-INR     Status: Abnormal   Collection Time: 04/08/14  8:23 PM   Result Value Ref Range   Prothrombin Time 18.3 (H) 11.6 - 15.2 seconds   INR 1.50 (H) 0.00 - 1.49  APTT     Status: Abnormal   Collection Time: 04/08/14  8:23 PM  Result Value Ref Range   aPTT 39 (H) 24 - 37 seconds    Comment:        IF BASELINE aPTT IS ELEVATED, SUGGEST PATIENT RISK ASSESSMENT BE USED TO DETERMINE APPROPRIATE ANTICOAGULANT THERAPY.   ABO/Rh     Status: None   Collection Time: 04/08/14  8:23 PM  Result Value Ref Range   ABO/RH(D) B NEG   POC occult blood, ED Provider will collect     Status: Abnormal   Collection Time: 04/08/14  8:47 PM  Result Value Ref Range   Fecal Occult Bld POSITIVE (A) NEGATIVE  Urinalysis, Routine w reflex microscopic     Status: Abnormal   Collection Time: 04/08/14  9:53 PM  Result Value Ref Range   Color, Urine YELLOW YELLOW   APPearance TURBID (A) CLEAR   Specific Gravity, Urine 1.016 1.005 - 1.030   pH 5.5 5.0 - 8.0   Glucose, UA NEGATIVE NEGATIVE mg/dL   Hgb urine dipstick LARGE (A) NEGATIVE   Bilirubin Urine NEGATIVE NEGATIVE   Ketones, ur NEGATIVE NEGATIVE mg/dL   Protein, ur NEGATIVE NEGATIVE mg/dL   Urobilinogen, UA 0.2 0.0 - 1.0 mg/dL   Nitrite NEGATIVE NEGATIVE   Leukocytes, UA LARGE (A) NEGATIVE  Urine microscopic-add on     Status: Abnormal   Collection Time: 04/08/14  9:53 PM  Result Value Ref Range   WBC, UA TOO NUMEROUS TO COUNT <3 WBC/hpf   RBC / HPF TOO NUMEROUS TO COUNT <3 RBC/hpf   Bacteria, UA MANY (A) RARE    Ct Abdomen Pelvis W Contrast  04/08/2014   CLINICAL DATA:  Rectal bleeding since last night. History of colonic abscess.  EXAM: CT ABDOMEN AND PELVIS WITH CONTRAST  TECHNIQUE: Multidetector CT imaging of the abdomen and pelvis was performed using the standard protocol following bolus  administration of intravenous contrast.  CONTRAST:  117m OMNIPAQUE IOHEXOL 300 MG/ML SOLN, 266mOMNIPAQUE IOHEXOL 300 MG/ML SOLN  COMPARISON:  Most recent CT 03/10/2014, CT 01/14/2014 also reviewed  FINDINGS: Mild  cardiomegaly noted at the lung bases. No parenchymal consolidation or pleural effusion.  There is worsening inflammation surrounding sigmoid diverticula with increased inflammation surrounding the previously seen fluid collection. Fluid collection measures 2.5 x 2.2 cm, previously 2.3 x 2.0 cm. There is increasing surrounding inflammatory change and bladder wall thickening. Air is again seen within the urinary bladder with wall thickening adjacent to the fluid collection. No new abscess. Additional extensive noninflamed diverticula throughout the colon are unchanged. No small bowel dilatation. The stomach is decompressed. The appendix is normal.  There are no focal hepatic lesions. Gallstones again noted within physiologically distended gallbladder. Spleen and adrenal glands are normal. Pancreatic atrophy with focal calcification in the pancreatic head, unchanged from prior exam.  There is bilateral renal cortical thinning, left greater than right. Stones are seen within both kidneys with questionable caliceal diverticulum on the left. There is no hydronephrosis. Stone in the distal left ureter is again suspected measuring 6 mm, unchanged from prior, no resultant ureteral dilatation.  Atherosclerotic calcifications without aneurysm. No retroperitoneal adenopathy. Fat containing periumbilical hernia. Enlarged prostate gland, similar to prior. No pelvic free fluid.  Left hip prosthesis again seen. Degenerative change of the spine and right hip. No acute or suspicious osseous abnormalities.  IMPRESSION: 1. Increased size of the peri-diverticular/colonic fluid collection in the sigmoid colon, currently measures 2.5 x 2.2 cm. The degree of surrounding soft tissue inflammation has increased compared to prior concerning for acute inflammation. 2. 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 The degree of bladder  wall thickening has increased from prior. 3. Additional stable chronic findings include bilateral nephrolithiasis, stone in the distal left ureter, unchanged. Cholelithiasis, fat containing ventral abdominal wall hernia, and atherosclerosis.   Electronically Signed   By: MeJeb Levering.D.   On: 04/08/2014 23:27   Dg Chest Port 1 View  04/08/2014   CLINICAL DATA:  Acute onset of diarrhea and rectal bleeding. Initial encounter.  EXAM: PORTABLE CHEST - 1 VIEW  COMPARISON:  Chest radiograph performed 03/25/2013  FINDINGS: The lungs are well-aerated. Pulmonary vascularity is at the upper limits of normal. Minimal bibasilar atelectasis is noted. There is no evidence of pleural effusion or pneumothorax.  The cardiomediastinal silhouette is borderline enlarged. No acute osseous abnormalities are seen.  IMPRESSION: Minimal bibasilar atelectasis noted.  Borderline cardiomegaly.   Electronically Signed   By: JeGarald Balding.D.   On: 04/08/2014 21:25   Dg Abd Portable 1v  04/08/2014   CLINICAL DATA:  Rectal bleeding, history of colonic abscess.  EXAM: PORTABLE ABDOMEN - 1 VIEW  COMPARISON:  CT 03/10/2014  FINDINGS: There is increased air throughout normal caliber small and large bowel loops throughout the abdomen. No bowel dilatation. No evidence of free intra-abdominal air on supine views. There is contrast within multiple diverticula in the left and distal colon from prior CT. Air within the central pelvis may be within the rectum versus air in the bladder, as seen on prior CT. Vascular calcifications are seen. Left hip prosthesis in place.  IMPRESSION: 1. Air-filled normal caliber small and large bowel loops throughout the abdomen. No bowel dilatation to suggest obstruction or evidence of free air. 2. Air in the central pelvis may be within the rectum  or urinary bladder as seen on prior CT.   Electronically Signed   By: Jeb Levering M.D.   On: 04/08/2014 21:27    Review of Systems  Constitutional: Negative  for fever, chills and weight loss.  Respiratory: Negative for cough and shortness of breath.   Cardiovascular: Negative for chest pain and leg swelling.  Gastrointestinal: Positive for blood in stool. Negative for nausea, vomiting and abdominal pain.  Genitourinary: Negative for dysuria, urgency and frequency.  Skin: Negative for itching and rash.  Neurological: Negative for dizziness and headaches.   Blood pressure 175/94, pulse 105, temperature 98.4 F (36.9 C), temperature source Oral, resp. rate 20, height _0  (1.676 m), weight 94.802 kg (209 lb), SpO2 96 %. Physical Exam  Constitutional: He is oriented to person, place, and time. He appears well-developed and well-nourished. No distress.  HENT:  Head: Normocephalic and atraumatic.  Eyes: Conjunctivae are normal. Pupils are equal, round, and reactive to light.  Neck: Normal range of motion. Neck supple.  Cardiovascular: Normal rate and regular rhythm.   Respiratory: Effort normal and breath sounds normal. No respiratory distress.  GI: Soft. He exhibits no distension. There is no tenderness.  Musculoskeletal: Normal range of motion.  Neurological: He is alert and oriented to person, place, and time.  Skin: Skin is warm and dry. He is not diaphoretic.    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  Recommend IV antibiotics and IR eval.  Hold Xarelto.  Pt will needs surgical resection in 2-3 months, once acute inflammation resolves.    Kathrynne Kulinski C. 7/46/0029, 84:73 PM

## 2014-04-09 ENCOUNTER — Encounter (HOSPITAL_COMMUNITY): Payer: Self-pay | Admitting: *Deleted

## 2014-04-09 DIAGNOSIS — Z833 Family history of diabetes mellitus: Secondary | ICD-10-CM | POA: Diagnosis not present

## 2014-04-09 DIAGNOSIS — I5032 Chronic diastolic (congestive) heart failure: Secondary | ICD-10-CM | POA: Diagnosis not present

## 2014-04-09 DIAGNOSIS — K5720 Diverticulitis of large intestine with perforation and abscess without bleeding: Secondary | ICD-10-CM | POA: Diagnosis not present

## 2014-04-09 DIAGNOSIS — E785 Hyperlipidemia, unspecified: Secondary | ICD-10-CM | POA: Diagnosis present

## 2014-04-09 DIAGNOSIS — I5042 Chronic combined systolic (congestive) and diastolic (congestive) heart failure: Secondary | ICD-10-CM | POA: Diagnosis present

## 2014-04-09 DIAGNOSIS — K921 Melena: Secondary | ICD-10-CM | POA: Diagnosis present

## 2014-04-09 DIAGNOSIS — Z7901 Long term (current) use of anticoagulants: Secondary | ICD-10-CM | POA: Diagnosis not present

## 2014-04-09 DIAGNOSIS — K219 Gastro-esophageal reflux disease without esophagitis: Secondary | ICD-10-CM | POA: Diagnosis present

## 2014-04-09 DIAGNOSIS — I252 Old myocardial infarction: Secondary | ICD-10-CM | POA: Diagnosis not present

## 2014-04-09 DIAGNOSIS — B9689 Other specified bacterial agents as the cause of diseases classified elsewhere: Secondary | ICD-10-CM | POA: Diagnosis present

## 2014-04-09 DIAGNOSIS — I10 Essential (primary) hypertension: Secondary | ICD-10-CM

## 2014-04-09 DIAGNOSIS — I255 Ischemic cardiomyopathy: Secondary | ICD-10-CM | POA: Diagnosis present

## 2014-04-09 DIAGNOSIS — N321 Vesicointestinal fistula: Secondary | ICD-10-CM | POA: Diagnosis present

## 2014-04-09 DIAGNOSIS — Z8249 Family history of ischemic heart disease and other diseases of the circulatory system: Secondary | ICD-10-CM | POA: Diagnosis not present

## 2014-04-09 DIAGNOSIS — Z96649 Presence of unspecified artificial hip joint: Secondary | ICD-10-CM | POA: Diagnosis present

## 2014-04-09 DIAGNOSIS — I2510 Atherosclerotic heart disease of native coronary artery without angina pectoris: Secondary | ICD-10-CM | POA: Diagnosis present

## 2014-04-09 DIAGNOSIS — Z823 Family history of stroke: Secondary | ICD-10-CM | POA: Diagnosis not present

## 2014-04-09 DIAGNOSIS — G4730 Sleep apnea, unspecified: Secondary | ICD-10-CM | POA: Diagnosis present

## 2014-04-09 DIAGNOSIS — K5732 Diverticulitis of large intestine without perforation or abscess without bleeding: Secondary | ICD-10-CM

## 2014-04-09 DIAGNOSIS — N390 Urinary tract infection, site not specified: Secondary | ICD-10-CM | POA: Diagnosis present

## 2014-04-09 DIAGNOSIS — K5721 Diverticulitis of large intestine with perforation and abscess with bleeding: Secondary | ICD-10-CM | POA: Diagnosis present

## 2014-04-09 DIAGNOSIS — K625 Hemorrhage of anus and rectum: Secondary | ICD-10-CM | POA: Diagnosis present

## 2014-04-09 DIAGNOSIS — I482 Chronic atrial fibrillation: Secondary | ICD-10-CM

## 2014-04-09 DIAGNOSIS — Z6832 Body mass index (BMI) 32.0-32.9, adult: Secondary | ICD-10-CM | POA: Diagnosis not present

## 2014-04-09 DIAGNOSIS — I4891 Unspecified atrial fibrillation: Secondary | ICD-10-CM | POA: Diagnosis present

## 2014-04-09 LAB — COMPREHENSIVE METABOLIC PANEL
ALT: 18 U/L (ref 0–53)
ANION GAP: 9 (ref 5–15)
AST: 22 U/L (ref 0–37)
Albumin: 3.2 g/dL — ABNORMAL LOW (ref 3.5–5.2)
Alkaline Phosphatase: 123 U/L — ABNORMAL HIGH (ref 39–117)
BILIRUBIN TOTAL: 1.4 mg/dL — AB (ref 0.3–1.2)
BUN: 17 mg/dL (ref 6–23)
CO2: 25 mmol/L (ref 19–32)
Calcium: 8.2 mg/dL — ABNORMAL LOW (ref 8.4–10.5)
Chloride: 104 mmol/L (ref 96–112)
Creatinine, Ser: 1.1 mg/dL (ref 0.50–1.35)
GFR calc Af Amer: 72 mL/min — ABNORMAL LOW (ref >90)
GFR calc non Af Amer: 62 mL/min — ABNORMAL LOW (ref >90)
GLUCOSE: 96 mg/dL (ref 70–99)
Potassium: 3.9 mmol/L (ref 3.5–5.1)
Sodium: 138 mmol/L (ref 135–145)
TOTAL PROTEIN: 6.1 g/dL (ref 6.0–8.3)

## 2014-04-09 LAB — CBC
HCT: 32.7 % — ABNORMAL LOW (ref 39.0–52.0)
HEMOGLOBIN: 11.1 g/dL — AB (ref 13.0–17.0)
MCH: 33 pg (ref 26.0–34.0)
MCHC: 33.9 g/dL (ref 30.0–36.0)
MCV: 97.3 fL (ref 78.0–100.0)
Platelets: 142 10*3/uL — ABNORMAL LOW (ref 150–400)
RBC: 3.36 MIL/uL — ABNORMAL LOW (ref 4.22–5.81)
RDW: 16.5 % — ABNORMAL HIGH (ref 11.5–15.5)
WBC: 6 10*3/uL (ref 4.0–10.5)

## 2014-04-09 LAB — PROTIME-INR
INR: 1.39 (ref 0.00–1.49)
Prothrombin Time: 17.2 seconds — ABNORMAL HIGH (ref 11.6–15.2)

## 2014-04-09 LAB — APTT: aPTT: 37 seconds (ref 24–37)

## 2014-04-09 MED ORDER — OXYCODONE HCL 5 MG PO TABS
5.0000 mg | ORAL_TABLET | ORAL | Status: DC | PRN
  Administered 2014-04-09: 5 mg via ORAL
  Filled 2014-04-09: qty 1

## 2014-04-09 MED ORDER — PIPERACILLIN-TAZOBACTAM 3.375 G IVPB 30 MIN
3.3750 g | Freq: Once | INTRAVENOUS | Status: AC
  Administered 2014-04-09: 3.375 g via INTRAVENOUS
  Filled 2014-04-09: qty 50

## 2014-04-09 MED ORDER — ONDANSETRON HCL 4 MG/2ML IJ SOLN
4.0000 mg | Freq: Four times a day (QID) | INTRAMUSCULAR | Status: DC | PRN
  Administered 2014-04-09: 4 mg via INTRAVENOUS
  Filled 2014-04-09: qty 2

## 2014-04-09 MED ORDER — ONDANSETRON HCL 4 MG PO TABS: 4.0000 mg | ORAL_TABLET | Freq: Four times a day (QID) | ORAL | Status: DC | PRN

## 2014-04-09 MED ORDER — EZETIMIBE 10 MG PO TABS
10.0000 mg | ORAL_TABLET | Freq: Every day | ORAL | Status: DC
  Administered 2014-04-09 – 2014-04-11 (×3): 10 mg via ORAL
  Filled 2014-04-09 (×3): qty 1

## 2014-04-09 MED ORDER — METOPROLOL SUCCINATE ER 50 MG PO TB24
50.0000 mg | ORAL_TABLET | Freq: Every day | ORAL | Status: DC
  Administered 2014-04-09 – 2014-04-11 (×3): 50 mg via ORAL
  Filled 2014-04-09 (×3): qty 1

## 2014-04-09 MED ORDER — DIPHENHYDRAMINE HCL 25 MG PO CAPS
ORAL_CAPSULE | ORAL | Status: AC
  Filled 2014-04-09: qty 1

## 2014-04-09 MED ORDER — PIPERACILLIN-TAZOBACTAM 3.375 G IVPB
3.3750 g | Freq: Three times a day (TID) | INTRAVENOUS | Status: DC
  Administered 2014-04-09 – 2014-04-11 (×6): 3.375 g via INTRAVENOUS
  Filled 2014-04-09 (×8): qty 50

## 2014-04-09 MED ORDER — ACETAMINOPHEN 650 MG RE SUPP: 650.0000 mg | Freq: Four times a day (QID) | RECTAL | Status: DC | PRN

## 2014-04-09 MED ORDER — SODIUM CHLORIDE 0.9 % IJ SOLN
3.0000 mL | Freq: Two times a day (BID) | INTRAMUSCULAR | Status: DC
  Administered 2014-04-09 – 2014-04-10 (×3): 3 mL via INTRAVENOUS

## 2014-04-09 MED ORDER — DIPHENHYDRAMINE HCL 25 MG PO CAPS
25.0000 mg | ORAL_CAPSULE | Freq: Once | ORAL | Status: AC
Start: 2014-04-09 — End: 2014-04-09
  Administered 2014-04-09: 25 mg via ORAL

## 2014-04-09 MED ORDER — FLUCONAZOLE IN SODIUM CHLORIDE 200-0.9 MG/100ML-% IV SOLN
200.0000 mg | INTRAVENOUS | Status: DC
  Administered 2014-04-09 – 2014-04-10 (×2): 200 mg via INTRAVENOUS
  Filled 2014-04-09 (×4): qty 100

## 2014-04-09 MED ORDER — LEVOTHYROXINE SODIUM 100 MCG PO TABS
100.0000 ug | ORAL_TABLET | Freq: Every day | ORAL | Status: DC
  Administered 2014-04-09 – 2014-04-11 (×3): 100 ug via ORAL
  Filled 2014-04-09 (×3): qty 1

## 2014-04-09 MED ORDER — DOXAZOSIN MESYLATE 8 MG PO TABS
8.0000 mg | ORAL_TABLET | Freq: Every day | ORAL | Status: DC
  Administered 2014-04-09 – 2014-04-10 (×2): 8 mg via ORAL
  Filled 2014-04-09 (×3): qty 1

## 2014-04-09 MED ORDER — GABAPENTIN 600 MG PO TABS: 300.0000 mg | ORAL_TABLET | Freq: Every day | ORAL | Status: DC

## 2014-04-09 MED ORDER — SACCHAROMYCES BOULARDII 250 MG PO CAPS
250.0000 mg | ORAL_CAPSULE | Freq: Every day | ORAL | Status: DC
  Administered 2014-04-09 – 2014-04-11 (×3): 250 mg via ORAL
  Filled 2014-04-09 (×3): qty 1

## 2014-04-09 MED ORDER — DEXTROSE-NACL 5-0.45 % IV SOLN
INTRAVENOUS | Status: DC
  Administered 2014-04-09: 10:00:00 via INTRAVENOUS

## 2014-04-09 MED ORDER — NITROFURANTOIN MONOHYD MACRO 100 MG PO CAPS
100.0000 mg | ORAL_CAPSULE | Freq: Every day | ORAL | Status: DC
  Filled 2014-04-09: qty 1

## 2014-04-09 MED ORDER — ACETAMINOPHEN 325 MG PO TABS: 650.0000 mg | ORAL_TABLET | Freq: Four times a day (QID) | ORAL | Status: DC | PRN

## 2014-04-09 NOTE — H&P (Signed)
[facility] History and Physical  Patient: Harold Jennings  MRN: [id number]  DOB: Feb 10, 1932  DOS: the patient was seen and examined on 04/09/2014 PCP: London Pepper, MD  Chief Complaint: Bleeding in the bowels  HPI: Harold Jennings is a 81 y.o. male with Past medical history of hypertension, coronary artery disease, morbid obesity, cardiomyopathy, combined systolic and diastolic heart failure, A. fib on Xarelto, sleep apnea. The patient presented with complaints of bright red blood per rectum. He mentions that he had 3 episodes of bowel movements which are loose and watery and along with that he had noticed a bright red blood. He denies any abdominal pain. Exam he complains of some fatigue and tiredness. He denies any fever but complains of chills. He denies any vomiting but complains of nausea occasionally. Complains of acid reflux occasionally. No cough no chest pain. He mentions he has been taking all his medications regularly. There is no recent change in his medications reported. He denies any burning with urination.  The patient is coming from home. And at his baseline independent for most of his ADL.  Review of Systems: as mentioned in the history of present illness.  A Comprehensive review of the other systems is negative.  Past Medical History  Diagnosis Date  . Dyslipidemia   . HTN (hypertension)   . Obesity   . CAD (coronary artery disease)     Silent MI sometime prior to 2000. S/P cath in 2000 showing a total mid RCA with collaterals from the LCX. EF is 40 to50%; Last Myoview in 2010 showing inferior scar without ischemia. EF was 52%.  . Increased liver enzymes     felt to be a fatty liver per ultrasound in 2011  . Dyspnea   . Ischemic cardiomyopathy   . Spinal stenosis    Past Surgical History  Procedure Laterality Date  . Total hip arthroplasty    . Eye surgery    . Lower back surgery      spinal stenosis  . Shoulder surgery    . Cardioversion N/A  06/06/2012    Procedure: CARDIOVERSION;  Surgeon: Thayer Headings, MD;  Location: [facility] ENDOSCOPY;  Service: Cardiovascular;  Laterality: N/A;   Social History:  reports that he has never smoked. He has never used smokeless tobacco. He reports that he drinks alcohol. He reports that he does not use illicit drugs.  No Known Allergies  Family History  Problem Relation Age of Onset  . Stroke Father   . Heart attack Father   . Hypertension Father   . Diabetes Father   . Drug abuse Mother   . Hypertension Brother   . Hypertension Brother   . Hypertension Brother   . Hypertension Sister     Prior to Admission medications   Medication Sig Start Date End Date Taking? Authorizing Provider  diphenhydrAMINE (BENADRYL) 25 mg capsule Take 25 mg by mouth at bedtime.    Yes Historical Provider, MD  doxazosin (CARDURA) 8 MG tablet Take 8 mg by mouth at bedtime.   Yes Historical Provider, MD  ezetimibe (ZETIA) 10 MG tablet Take 10 mg by mouth daily.   Yes Historical Provider, MD  furosemide (LASIX) 40 MG tablet Take 40 mg by mouth as needed for fluid or edema (fluid).  04/29/12  Yes Sherren Mocha, MD  levothyroxine (SYNTHROID, LEVOTHROID) 100 MCG tablet Take 100 mcg by mouth daily. 09/20/13  Yes Historical Provider, MD  loperamide (IMODIUM) 1 MG/5ML solution Take 10 mLs (2 mg  total) by mouth every 6 (six) hours as needed for diarrhea or loose stools. 01/18/14  Yes Robbie Lis, MD  metoprolol succinate (TOPROL-XL) 50 MG 24 hr tablet Take one tablet by mouth every morning, take one-half tablet every evening. Take with or immediately following a meal. Patient taking differently: Take 50 mg by mouth daily.  09/02/13  Yes Sherren Mocha, MD  Multiple Vitamin (MULTIVITAMIN) tablet Take 1 tablet by mouth daily.   Yes Historical Provider, MD  naproxen sodium (ANAPROX) 220 MG tablet Take 220 mg by mouth at bedtime.   Yes Historical Provider, MD  nitrofurantoin, macrocrystal-monohydrate, (MACROBID) 100 MG capsule Take  100 mg by mouth daily.   Yes Historical Provider, MD  Rivaroxaban (XARELTO) 15 MG TABS tablet Take 1 tablet (15 mg total) by mouth daily with supper. 01/18/14  Yes Robbie Lis, MD  Rivaroxaban (XARELTO) 15 MG TABS tablet Take 1 tablet (15 mg total) by mouth daily with supper. 01/24/14  Yes Sherren Mocha, MD  saccharomyces boulardii (FLORASTOR) 250 MG capsule Take 1 capsule (250 mg total) by mouth 2 (two) times daily. Patient taking differently: Take 250 mg by mouth daily.  01/19/14  Yes Robbie Lis, MD  amoxicillin-clavulanate (AUGMENTIN) 875-125 MG per tablet Take 1 tablet by mouth 2 (two) times daily. Patient not taking: Reported on 04/08/2014 01/18/14   Robbie Lis, MD  gabapentin (NEURONTIN) 600 MG tablet Take 300 mg by mouth at bedtime.     Historical Provider, MD  guaiFENesin (ROBITUSSIN) 100 MG/5ML SOLN Take 20 mLs (400 mg total) by mouth every 6 (six) hours as needed for cough. 01/18/14   Robbie Lis, MD  metroNIDAZOLE (FLAGYL) 500 MG tablet Take 1 tablet (500 mg total) by mouth 3 (three) times daily. Patient not taking: Reported on 04/08/2014 01/18/14   Robbie Lis, MD    Physical Exam: Filed Vitals:   04/09/14 0100 04/09/14 0130 04/09/14 0205 04/09/14 0532  BP: 174/74 155/79 150/94 113/44  Pulse: 91 99  81  Temp:   97.5 F (36.4 C) 98 F (36.7 C)  TempSrc:   Oral Oral  Resp: 18 24 20 20   Height:   5\' 7"  (1.702 m)   Weight:   94.257 kg (207 lb 12.8 oz)   SpO2: 95% 95% 96% 96%    General: Alert, Awake and Oriented to Time, Place and Person. Appear in mild distress Eyes: PERRL ENT: Oral Mucosa clear moist. Neck: no JVD Cardiovascular: S1 and S2 Present, aortic systolic Murmur, Peripheral Pulses Present Respiratory: Bilateral Air entry equal and Decreased, Clear to Auscultation, noCrackles, no wheezes Abdomen: Bowel Sound present, sluggish, Soft and minimally left tender Skin: no Rash Extremities: Bilateral Pedal edema, no calf tenderness Neurologic: Grossly no focal neuro  deficit.  Labs on Admission:  CBC:  Recent Labs Lab 04/08/14 2023 04/09/14 0540  WBC 7.2 6.0  NEUTROABS 4.3  --   HGB 11.9* 11.1*  HCT 34.5* 32.7*  MCV 96.6 97.3  PLT 161 142*    CMP     Component Value Date/Time   NA 136 04/08/2014 2023   K 3.7 04/08/2014 2023   CL 103 04/08/2014 2023   CO2 23 04/08/2014 2023   GLUCOSE 119* 04/08/2014 2023   BUN 19 04/08/2014 2023   CREATININE 1.00 04/08/2014 2023   CALCIUM 8.8 04/08/2014 2023   PROT 6.9 04/08/2014 2023   ALBUMIN 3.5 04/08/2014 2023   AST 28 04/08/2014 2023   ALT 22 04/08/2014 2023   ALKPHOS 134*  04/08/2014 2023   BILITOT 0.9 04/08/2014 2023   GFRNONAA 69* 04/08/2014 2023   GFRAA 80* 04/08/2014 2023     Recent Labs Lab 04/08/14 2023  LIPASE 51    No results for input(s): CKTOTAL, CKMB, CKMBINDEX, TROPONINI in the last 168 hours. BNP (last 3 results) No results for input(s): BNP in the last 8760 hours.  ProBNP (last 3 results) No results for input(s): PROBNP in the last 8760 hours.   Radiological Exams on Admission: Ct Abdomen Pelvis W Contrast  04/08/2014   CLINICAL DATA:  Rectal bleeding since last night. History of colonic abscess.  EXAM: CT ABDOMEN AND PELVIS WITH CONTRAST  TECHNIQUE: Multidetector CT imaging of the abdomen and pelvis was performed using the standard protocol following bolus administration of intravenous contrast.  CONTRAST:  165mL OMNIPAQUE IOHEXOL 300 MG/ML SOLN, 56mL OMNIPAQUE IOHEXOL 300 MG/ML SOLN  COMPARISON:  Most recent CT 03/10/2014, CT 01/14/2014 also reviewed  FINDINGS: Mild cardiomegaly noted at the lung bases. No parenchymal consolidation or pleural effusion.  There is worsening inflammation surrounding sigmoid diverticula with increased inflammation surrounding the previously seen fluid collection. Fluid collection measures 2.5 x 2.2 cm, previously 2.3 x 2.0 cm. There is increasing surrounding inflammatory change and bladder wall thickening. Air is again seen within the urinary  bladder with wall thickening adjacent to the fluid collection. No new abscess. Additional extensive noninflamed diverticula throughout the colon are unchanged. No small bowel dilatation. The stomach is decompressed. The appendix is normal.  There are no focal hepatic lesions. Gallstones again noted within physiologically distended gallbladder. Spleen and adrenal glands are normal. Pancreatic atrophy with focal calcification in the pancreatic head, unchanged from prior exam.  There is bilateral renal cortical thinning, left greater than right. Stones are seen within both kidneys with questionable caliceal diverticulum on the left. There is no hydronephrosis. Stone in the distal left ureter is again suspected measuring 6 mm, unchanged from prior, no resultant ureteral dilatation.  Atherosclerotic calcifications without aneurysm. No retroperitoneal adenopathy. Fat containing periumbilical hernia. Enlarged prostate gland, similar to prior. No pelvic free fluid.  Left hip prosthesis again seen. Degenerative change of the spine and right hip. No acute or suspicious osseous abnormalities.  IMPRESSION: 1. Increased size of the peri-diverticular/colonic fluid collection in the sigmoid colon, currently measures 2.5 x 2.2 cm. The degree of surrounding soft tissue inflammation has increased compared to prior concerning for acute inflammation. 2. 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 The degree of bladder wall thickening has increased from prior. 3. Additional stable chronic findings include bilateral nephrolithiasis, stone in the distal left ureter, unchanged. Cholelithiasis, fat containing ventral abdominal wall hernia, and atherosclerosis.   Electronically Signed   By: Jeb Levering M.D.   On: 04/08/2014 23:27   Dg Chest Port 1 View  04/08/2014   CLINICAL DATA:  Acute onset of diarrhea and rectal  bleeding. Initial encounter.  EXAM: PORTABLE CHEST - 1 VIEW  COMPARISON:  Chest radiograph performed 03/25/2013  FINDINGS: The lungs are well-aerated. Pulmonary vascularity is at the upper limits of normal. Minimal bibasilar atelectasis is noted. There is no evidence of pleural effusion or pneumothorax.  The cardiomediastinal silhouette is borderline enlarged. No acute osseous abnormalities are seen.  IMPRESSION: Minimal bibasilar atelectasis noted.  Borderline cardiomegaly.   Electronically Signed   By: Garald Balding M.D.   On: 04/08/2014 21:25   Dg Abd Portable 1v  04/08/2014  CLINICAL DATA:  Rectal bleeding, history of colonic abscess.  EXAM: PORTABLE ABDOMEN - 1 VIEW  COMPARISON:  CT 03/10/2014  FINDINGS: There is increased air throughout normal caliber small and large bowel loops throughout the abdomen. No bowel dilatation. No evidence of free intra-abdominal air on supine views. There is contrast within multiple diverticula in the left and distal colon from prior CT. Air within the central pelvis may be within the rectum versus air in the bladder, as seen on prior CT. Vascular calcifications are seen. Left hip prosthesis in place.  IMPRESSION: 1. Air-filled normal caliber small and large bowel loops throughout the abdomen. No bowel dilatation to suggest obstruction or evidence of free air. 2. Air in the central pelvis may be within the rectum or urinary bladder as seen on prior CT.   Electronically Signed   By: Jeb Levering M.D.   On: 04/08/2014 21:27    Assessment/Plan Principal Problem:   Colonic diverticular abscess Active Problems:   Essential hypertension   GERD   Chronic diastolic heart failure, NYHA class 3   Atrial fibrillation   Sigmoid diverticulitis   BRBPR (bright red blood per rectum)   1. Colonic diverticular abscess  the patient is presenting with complaints of bright red blood per rectum. He did have some tenderness on examination. A CT scan was performed which shows  possible sigmoid abscess. Gen. surgery was consulted. Surgery recommended conservative management at present. He'll be treated with Zosyn since he has been treated with the same in the past. Due to his bright red blood per rectum we will be holding his Xarelto. Follow surgery recommendation for further workup.  2.History of combined diastolic and systolic heart failure. Patient currently remains nothing by mouth. Currently holding his diuretics. Closely monitor ins and outs.  3. essential hypertension. Blood pressures are stable. Continuing home medications.  4. hypothyroidism. Continue Synthroid.  5. bright red blood per rectum. Most likely secondary to ongoing infection and abscess. Monitor H&H. Patient currently hemodynamically stable. Patient remains nothing by mouth except medications. Holding Xarelto   Advance goals of care discussion: full code    Consults: Gen. surgery  DVT Prophylaxis: mechanical compression device Nutrition: Nothing by mouth except medications  Disposition: Admitted to inpatient in telemetry unit.  Author: Berle Mull, MD [facility] Pager: [phone] 04/09/2014, 5:59 AM    If 7PM-7AM, please contact night-coverage [email or web address] Password [email or web address]

## 2014-04-09 NOTE — ED Notes (Addendum)
Called unit and talk to secretary to let her patient will be up in 20 minutes 1411.

## 2014-04-09 NOTE — Progress Notes (Signed)
Patient ID: Harold Jennings, male   DOB: 16-Mar-1932, 81 y.o.   MRN: [id number] Request received for CT guided drainage of pelvic abscess in pt. Imaging studies were reviewed by Dr. Vernard Gambles and area is small in size and not accessible by percutaneous route. Findings d/w Dr. Tana Coast.

## 2014-04-09 NOTE — Progress Notes (Signed)
ANTIBIOTIC CONSULT NOTE - INITIAL  Pharmacy Consult for Zosyn Indication: Intra-abdominal infection  No Known Allergies  Patient Measurements: Height: 5\' 6"  (167.6 cm) Weight: 209 lb (94.802 kg) IBW/kg (Calculated) : 63.8   Vital Signs: Temp: 98.4 F (36.9 C) (03/23 2351) Temp Source: Oral (03/23 2351) BP: 175/94 mmHg (03/23 2351) Pulse Rate: 105 (03/23 2351) Intake/Output from previous day: 03/23 0701 - 03/24 0700 In: 1000 [IV Piggyback:1000] Out: -  Intake/Output from this shift: Total I/O In: 1000 [IV Piggyback:1000] Out: -   Labs:  Recent Labs  04/08/14 2023  WBC 7.2  HGB 11.9*  PLT 161  CREATININE 1.00   Estimated Creatinine Clearance: 64.6 mL/min (by C-G formula based on Cr of 1). No results for input(s): VANCOTROUGH, VANCOPEAK, VANCORANDOM, GENTTROUGH, GENTPEAK, GENTRANDOM, TOBRATROUGH, TOBRAPEAK, TOBRARND, AMIKACINPEAK, AMIKACINTROU, AMIKACIN in the last 72 hours.   Microbiology: No results found for this or any previous visit (from the past 720 hour(s)).  Medical History: Past Medical History  Diagnosis Date  . Dyslipidemia   . HTN (hypertension)   . Obesity   . CAD (coronary artery disease)     Silent MI sometime prior to 2000. S/P cath in 2000 showing a total mid RCA with collaterals from the LCX. EF is 40 to50%; Last Myoview in 2010 showing inferior scar without ischemia. EF was 52%.  . Increased liver enzymes     felt to be a fatty liver per ultrasound in 2011  . Dyspnea   . Ischemic cardiomyopathy   . Spinal stenosis     Medications:   (Not in a hospital admission) Scheduled:  . doxazosin  8 mg Oral QHS  . ezetimibe  10 mg Oral Daily  . gabapentin  300 mg Oral QHS  . levothyroxine  100 mcg Oral Daily  . metoprolol succinate  50 mg Oral Daily  . nitrofurantoin (macrocrystal-monohydrate)  100 mg Oral Daily  . saccharomyces boulardii  250 mg Oral Daily  . sodium chloride  3 mL Intravenous Q12H   Infusions:  . 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 IV Zosyn per Rx for intra-abdominal infection.   Goal of Therapy:  Treat infection  Plan:   Zosyn 3.375 Gm IV q8h EI  F/u SCr/cultures  Harold Jennings R 04/09/2014,1:23 AM

## 2014-04-09 NOTE — Progress Notes (Signed)
[facility]                                                                              Patient Demographics  Harold Jennings, is a 81 y.o. male, DOB - Oct 05, 1932, CBU:[id number]  Admit date - 04/08/2014   Admitting Physician Lavina Hamman, MD  Outpatient Primary MD for the patient is London Pepper, MD  LOS - 0   Chief Complaint  Patient presents with  . Rectal Bleeding       Brief HPI   [name] is a 81 y.o. male with Past medical history of hypertension, coronary artery disease, morbid obesity, cardiomyopathy, combined systolic and diastolic heart failure, A. fib on Xarelto, sleep apnea. The patient presented with complaints of bright red blood per rectum. He had 3 episodes of bowel movements which are loose and watery and along with that he had noticed a bright red blood.   Assessment & Plan    Principal Problem:   BRBPR with sigmoid diverticulitis with diverticular abscess with possible colovesical fistula (also on anticoagulation outpatient) - CT abdomen and pelvis showed sigmoid abscess. General surgery was consulted and recommended conservative management at this time, IV fluids and IV antibiotics. - IR consulted, imaging studies were reviewed by Dr. Vernard Gambles and area is small in size and not accessible by percutaneous route - Continue IV Zosyn - No pneumaturia, per surgery, if no improvement, will need a Hartman's procedure this admission, if improving, one-step surgery in 2-3 months. Patient is also followed by urology, Dr. Luberta Robertson, urology notified per patient's request however no urgent urological needs.    Active Problems:   Essential hypertension - Currently stable, hold diuretics    GERD -Continue PPI     Chronic diastolic heart failure, NYHA class 3 - Currently on clear liquid diet, hold diuretics to avoid dehydration. Will DC fluids    Atrial fibrillation - Continue Toprol-XL, hold xarelto   Code Status: FC  Family  Communication: Discussed in detail with the patient, all imaging results, lab results explained to the patient    Disposition Plan:   Time Spent in minutes   25 minutes  Procedures  CT abd  Consults   Surgery IR  DVT Prophylaxis  SCD's  Medications  Scheduled Meds: . doxazosin  8 mg Oral QHS  . ezetimibe  10 mg Oral Daily  . fluconazole (DIFLUCAN) IV  200 mg Intravenous Q24H  . levothyroxine  100 mcg Oral Daily  . metoprolol succinate  50 mg Oral Daily  . piperacillin-tazobactam (ZOSYN)  IV  3.375 g Intravenous Q8H  . saccharomyces boulardii  250 mg Oral Daily  . sodium chloride  3 mL Intravenous Q12H   Continuous Infusions: . dextrose 5 % and 0.45% NaCl 50 mL/hr at 04/09/14 0946   PRN Meds:.acetaminophen **OR** acetaminophen, ondansetron **OR** ondansetron (ZOFRAN) IV   Antibiotics   Anti-infectives    Start     Dose/Rate Route Frequency Ordered Stop   04/09/14 1000  piperacillin-tazobactam (ZOSYN) IVPB 3.375 g     3.375 g 12.5 mL/hr over 240 Minutes Intravenous Every 8 hours 04/09/14 0127     04/09/14 1000  fluconazole (DIFLUCAN) IVPB 200 mg  200 mg 100 mL/hr over 60 Minutes Intravenous Every 24 hours 04/09/14 0759     04/09/14 0030  piperacillin-tazobactam (ZOSYN) IVPB 3.375 g     3.375 g 100 mL/hr over 30 Minutes Intravenous  Once 04/09/14 0016 04/09/14 0150        Subjective:   Harold Jennings was seen and examined today. Currently minimal abdominal pain, no nausea or vomiting. Per patient, no further bleeding since hospitalization. Patient denies dizziness, chest pain, shortness of breath, new weakness, numbess, tingling. No acute events overnight.    Objective:   Blood pressure 115/42, pulse 80, temperature 98 F (36.7 C), temperature source Oral, resp. rate 20, height 5\' 7"  (1.702 m), weight 94.257 kg (207 lb 12.8 oz), SpO2 96 %.  Wt Readings from Last 3 Encounters:  04/09/14 94.257 kg (207 lb 12.8 oz)  01/17/14 97.3 kg (214 lb 8.1 oz)    12/09/13 103.329 kg (227 lb 12.8 oz)     Intake/Output Summary (Last 24 hours) at 04/09/14 1149 Last data filed at 04/09/14 0500  Gross per 24 hour  Intake   1050 ml  Output    150 ml  Net    900 ml    Exam  General: Alert and oriented x 3, NAD  HEENT:  PERRLA, EOMI, Anicteic Sclera, mucous membranes moist.   Neck: Supple, no JVD, no masses  CVS: S1 S2 auscultated, no rubs, murmurs or gallops. Regular rate and rhythm.  Respiratory: Clear to auscultation bilaterally, no wheezing, rales or rhonchi  Abdomen: Soft, nontender, nondistended, + bowel sounds  Ext: no cyanosis clubbing or edema  Neuro: AAOx3, Cr N's II- XII. Strength 5/5 upper and lower extremities bilaterally  Skin: No rashes  Psych: Normal affect and demeanor, alert and oriented x3    Data Review   Micro Results No results found for this or any previous visit (from the past 240 hour(s)).  Radiology Reports Ct Abdomen Pelvis W Contrast  04/08/2014   CLINICAL DATA:  Rectal bleeding since last night. History of colonic abscess.  EXAM: CT ABDOMEN AND PELVIS WITH CONTRAST  TECHNIQUE: Multidetector CT imaging of the abdomen and pelvis was performed using the standard protocol following bolus administration of intravenous contrast.  CONTRAST:  130mL OMNIPAQUE IOHEXOL 300 MG/ML SOLN, 45mL OMNIPAQUE IOHEXOL 300 MG/ML SOLN  COMPARISON:  Most recent CT 03/10/2014, CT 01/14/2014 also reviewed  FINDINGS: Mild cardiomegaly noted at the lung bases. No parenchymal consolidation or pleural effusion.  There is worsening inflammation surrounding sigmoid diverticula with increased inflammation surrounding the previously seen fluid collection. Fluid collection measures 2.5 x 2.2 cm, previously 2.3 x 2.0 cm. There is increasing surrounding inflammatory change and bladder wall thickening. Air is again seen within the urinary bladder with wall thickening adjacent to the fluid collection. No new abscess. Additional extensive noninflamed  diverticula throughout the colon are unchanged. No small bowel dilatation. The stomach is decompressed. The appendix is normal.  There are no focal hepatic lesions. Gallstones again noted within physiologically distended gallbladder. Spleen and adrenal glands are normal. Pancreatic atrophy with focal calcification in the pancreatic head, unchanged from prior exam.  There is bilateral renal cortical thinning, left greater than right. Stones are seen within both kidneys with questionable caliceal diverticulum on the left. There is no hydronephrosis. Stone in the distal left ureter is again suspected measuring 6 mm, unchanged from prior, no resultant ureteral dilatation.  Atherosclerotic calcifications without aneurysm. No retroperitoneal adenopathy. Fat containing periumbilical hernia. Enlarged prostate gland, similar to prior. No pelvic  free fluid.  Left hip prosthesis again seen. Degenerative change of the spine and right hip. No acute or suspicious osseous abnormalities.  IMPRESSION: 1. Increased size of the peri-diverticular/colonic fluid collection in the sigmoid colon, currently measures 2.5 x 2.2 cm. The degree of surrounding soft tissue inflammation has increased compared to prior concerning for acute inflammation. 2. 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 The degree of bladder wall thickening has increased from prior. 3. Additional stable chronic findings include bilateral nephrolithiasis, stone in the distal left ureter, unchanged. Cholelithiasis, fat containing ventral abdominal wall hernia, and atherosclerosis.   Electronically Signed   By: Jeb Levering M.D.   On: 04/08/2014 23:27   Ct Abdomen Pelvis W Contrast  03/10/2014   CLINICAL DATA:  Diverticular abscess. Diverticulitis 01/14/2014. Currently without complaint. Left hip replacement. Right lower extremity primary malignancy.   EXAM: CT ABDOMEN AND PELVIS WITH CONTRAST  TECHNIQUE: Multidetector CT imaging of the abdomen and pelvis was performed using the standard protocol following bolus administration of intravenous contrast.  CONTRAST:  149mL OMNIPAQUE IOHEXOL 300 MG/ML  SOLN  BUN and creatinine were obtained on site at [place] at  [address]  Results:  BUN 18 mg/dL,  Creatinine 0.9 mg/dL.  COMPARISON:  01/14/2014  FINDINGS: Lower chest: Clear lung bases. Mild cardiomegaly with coronary artery atherosclerosis.  Hepatobiliary: Normal liver. Cholelithiasis without acute cholecystitis or biliary ductal dilatation.  Pancreas: Pancreatic parenchymal calcification within the head. Felt to be positioned adjacent but separate to the common duct. Mild pancreatic atrophy. No duct dilatation or acute inflammation.  Spleen: Normal  Adrenals/Urinary Tract: Normal adrenal glands. Mild renal cortical thinning bilaterally. Although there is no significant hydroureter, new stone or stones are seen at the distal left ureter. On the order of 7 mm on image 78 of series 2.  Degraded evaluation of the pelvis, secondary to beam hardening artifact from left hip arthroplasty. Air within the urinary bladder, new.  Stomach/Bowel: Normal stomach, without wall thickening. Extensive colonic diverticulosis. Inflammation surrounding the sigmoid is improved to resolved. The peripherally enhancing fluid collection about the inferior aspect of the sigmoid is decreased. Measures 2.3 x 2.0 cm on image 71 versus 4.8 x 2.6 cm on the prior. Immediately posterior to the urinary bladder.  Normal terminal ileum and appendix.  Normal small bowel.  Vascular/Lymphatic: Advanced aortic and branch vessel atherosclerosis. No abdominopelvic adenopathy.  Reproductive: Mild prostatomegaly.  Other: No cul-de-sac fluid. Fat containing ventral abdominal wall hernia is moderate and similar.  Musculoskeletal: Left hip arthroplasty. Mild to moderate right hip  osteoarthritis.  IMPRESSION: 1. Decreased size of a fluid collection adjacent the sigmoid with improved to resolved inflammation. 2. Development of air within the urinary bladder. Given the location of the pericolonic fluid collection, immediately adjacent to the bladder, this is highly suspicious for colovesicular fistula. These results will be called to the ordering clinician or representative by the Radiologist Assistant, and communication documented in the PACS or zVision Dashboard. 3. Development of distal left ureteric stone or stones. 4. Degraded evaluation of the pelvis, secondary to beam hardening artifact from left hip arthroplasty. 5.  Atherosclerosis, including within the coronary arteries. 6. Fat containing ventral abdominal wall hernia.   Electronically Signed   By: Abigail Miyamoto M.D.   On: 03/10/2014 16:29   Dg Chest Port 1 View  04/08/2014   CLINICAL DATA:  Acute onset of diarrhea and rectal bleeding. Initial  encounter.  EXAM: PORTABLE CHEST - 1 VIEW  COMPARISON:  Chest radiograph performed 03/25/2013  FINDINGS: The lungs are well-aerated. Pulmonary vascularity is at the upper limits of normal. Minimal bibasilar atelectasis is noted. There is no evidence of pleural effusion or pneumothorax.  The cardiomediastinal silhouette is borderline enlarged. No acute osseous abnormalities are seen.  IMPRESSION: Minimal bibasilar atelectasis noted.  Borderline cardiomegaly.   Electronically Signed   By: Garald Balding M.D.   On: 04/08/2014 21:25   Dg Abd Portable 1v  04/08/2014   CLINICAL DATA:  Rectal bleeding, history of colonic abscess.  EXAM: PORTABLE ABDOMEN - 1 VIEW  COMPARISON:  CT 03/10/2014  FINDINGS: There is increased air throughout normal caliber small and large bowel loops throughout the abdomen. No bowel dilatation. No evidence of free intra-abdominal air on supine views. There is contrast within multiple diverticula in the left and distal colon from prior CT. Air within the central pelvis may  be within the rectum versus air in the bladder, as seen on prior CT. Vascular calcifications are seen. Left hip prosthesis in place.  IMPRESSION: 1. Air-filled normal caliber small and large bowel loops throughout the abdomen. No bowel dilatation to suggest obstruction or evidence of free air. 2. Air in the central pelvis may be within the rectum or urinary bladder as seen on prior CT.   Electronically Signed   By: Jeb Levering M.D.   On: 04/08/2014 21:27    CBC  Recent Labs Lab 04/08/14 2023 04/09/14 0540  WBC 7.2 6.0  HGB 11.9* 11.1*  HCT 34.5* 32.7*  PLT 161 142*  MCV 96.6 97.3  MCH 33.3 33.0  MCHC 34.5 33.9  RDW 16.1* 16.5*  LYMPHSABS 2.0  --   MONOABS 0.7  --   EOSABS 0.2  --   BASOSABS 0.0  --     Chemistries   Recent Labs Lab 04/08/14 2023 04/09/14 0540  NA 136 138  K 3.7 3.9  CL 103 104  CO2 23 25  GLUCOSE 119* 96  BUN 19 17  CREATININE 1.00 1.10  CALCIUM 8.8 8.2*  AST 28 22  ALT 22 18  ALKPHOS 134* 123*  BILITOT 0.9 1.4*   ------------------------------------------------------------------------------------------------------------------ estimated creatinine clearance is 59.6 mL/min (by C-G formula based on Cr of 1.1). ------------------------------------------------------------------------------------------------------------------ No results for input(s): HGBA1C in the last 72 hours. ------------------------------------------------------------------------------------------------------------------ No results for input(s): CHOL, HDL, LDLCALC, TRIG, CHOLHDL, LDLDIRECT in the last 72 hours. ------------------------------------------------------------------------------------------------------------------ No results for input(s): TSH, T4TOTAL, T3FREE, THYROIDAB in the last 72 hours.  Invalid input(s): FREET3 ------------------------------------------------------------------------------------------------------------------ No results for input(s): VITAMINB12,  FOLATE, FERRITIN, TIBC, IRON, RETICCTPCT in the last 72 hours.  Coagulation profile  Recent Labs Lab 04/08/14 2023 04/09/14 0540  INR 1.50* 1.39    No results for input(s): DDIMER in the last 72 hours.  Cardiac Enzymes No results for input(s): CKMB, TROPONINI, MYOGLOBIN in the last 168 hours.  Invalid input(s): CK ------------------------------------------------------------------------------------------------------------------ Invalid input(s): POCBNP  No results for input(s): GLUCAP in the last 72 hours.   RAI,RIPUDEEP M.D. [facility] 04/09/2014, 11:49 AM  Pager: [phone]   Between 7am to 7pm - call Pager - [phone]  After 7pm go to [email or web address] - password [email or web address]  Call night coverage person covering after 7pm

## 2014-04-09 NOTE — Progress Notes (Signed)
Patient ID: Harold Jennings, male   DOB: [birth year], 81 y.o.   MRN: [id number]     [facility]      [address] [place], [place] [address]    Phone: [phone] FAX: [phone]     Subjective: Normal white count.  Afebrile.  VSS.  Little pain.  No pneumaturia in 1 month.    Objective:  Vital signs:  Filed Vitals:   04/09/14 0130 04/09/14 0205 04/09/14 0532 04/09/14 0947  BP: 155/79 150/94 113/44 115/42  Pulse: 99  81 80  Temp:  97.5 F (36.4 C) 98 F (36.7 C)   TempSrc:  Oral Oral   Resp: '24 20 20   ' Height:  '5\' 7"'  (1.702 m)    Weight:  94.257 kg (207 lb 12.8 oz)    SpO2: 95% 96% 96%     Last BM Date: 04/09/14  Intake/Output   Yesterday:  03/23 0701 - 03/24 0700 In: 1050 [IV Piggyback:1050] Out: 150 [Urine:150] This shift:    I/O last 3 completed shifts: In: 1050 [IV Piggyback:1050] Out: 150 [Urine:150]    Physical Exam: General: Pt awake/alert/oriented x4 in no acute distress Abdomen: Soft.  Nondistended.  Non tender.  No evidence of peritonitis.  No incarcerated hernias.    Problem List:   Principal Problem:   Colonic diverticular abscess Active Problems:   Essential hypertension   GERD   Chronic diastolic heart failure, NYHA class 3   Atrial fibrillation   Sigmoid diverticulitis   BRBPR (bright red blood per rectum)    Results:   Labs: Results for orders placed or performed during the hospital encounter of 04/08/14 (from the past 48 hour(s))  Comprehensive metabolic panel     Status: Abnormal   Collection Time: 04/08/14  8:23 PM  Result Value Ref Range   Sodium 136 135 - 145 mmol/L   Potassium 3.7 3.5 - 5.1 mmol/L   Chloride 103 96 - 112 mmol/L   CO2 23 19 - 32 mmol/L   Glucose, Bld 119 (H) 70 - 99 mg/dL   BUN 19 6 - 23 mg/dL   Creatinine, Ser 1.00 0.50 - 1.35 mg/dL   Calcium 8.8 8.4 - 10.5 mg/dL   Total Protein 6.9 6.0 - 8.3 g/dL   Albumin 3.5 3.5 - 5.2 g/dL   AST 28 0 - 37 U/L   ALT  22 0 - 53 U/L   Alkaline Phosphatase 134 (H) 39 - 117 U/L   Total Bilirubin 0.9 0.3 - 1.2 mg/dL   GFR calc non Af Amer 69 (L) >90 mL/min   GFR calc Af Amer 80 (L) >90 mL/min    Comment: (NOTE) The eGFR has been calculated using the CKD EPI equation. This calculation has not been validated in all clinical situations. eGFR's persistently <90 mL/min signify possible Chronic Kidney Disease.    Anion gap 10 5 - 15  Type and screen     Status: None   Collection Time: 04/08/14  8:23 PM  Result Value Ref Range   ABO/RH(D) B NEG    Antibody Screen NEG    Sample Expiration 04/11/2014   Lipase, blood     Status: None   Collection Time: 04/08/14  8:23 PM  Result Value Ref Range   Lipase 51 11 - 59 U/L  CBC with Differential/Platelet     Status: Abnormal   Collection Time: 04/08/14  8:23 PM  Result Value Ref Range   WBC 7.2 4.0 -  10.5 K/uL   RBC 3.57 (L) 4.22 - 5.81 MIL/uL   Hemoglobin 11.9 (L) 13.0 - 17.0 g/dL   HCT 34.5 (L) 39.0 - 52.0 %   MCV 96.6 78.0 - 100.0 fL   MCH 33.3 26.0 - 34.0 pg   MCHC 34.5 30.0 - 36.0 g/dL   RDW 16.1 (H) 11.5 - 15.5 %   Platelets 161 150 - 400 K/uL   Neutrophils Relative % 60 43 - 77 %   Neutro Abs 4.3 1.7 - 7.7 K/uL   Lymphocytes Relative 28 12 - 46 %   Lymphs Abs 2.0 0.7 - 4.0 K/uL   Monocytes Relative 9 3 - 12 %   Monocytes Absolute 0.7 0.1 - 1.0 K/uL   Eosinophils Relative 3 0 - 5 %   Eosinophils Absolute 0.2 0.0 - 0.7 K/uL   Basophils Relative 0 0 - 1 %   Basophils Absolute 0.0 0.0 - 0.1 K/uL  Protime-INR     Status: Abnormal   Collection Time: 04/08/14  8:23 PM  Result Value Ref Range   Prothrombin Time 18.3 (H) 11.6 - 15.2 seconds   INR 1.50 (H) 0.00 - 1.49  APTT     Status: Abnormal   Collection Time: 04/08/14  8:23 PM  Result Value Ref Range   aPTT 39 (H) 24 - 37 seconds    Comment:        IF BASELINE aPTT IS ELEVATED, SUGGEST PATIENT RISK ASSESSMENT BE USED TO DETERMINE APPROPRIATE ANTICOAGULANT THERAPY.   ABO/Rh     Status: None    Collection Time: 04/08/14  8:23 PM  Result Value Ref Range   ABO/RH(D) B NEG   POC occult blood, ED Provider will collect     Status: Abnormal   Collection Time: 04/08/14  8:47 PM  Result Value Ref Range   Fecal Occult Bld POSITIVE (A) NEGATIVE  Urinalysis, Routine w reflex microscopic     Status: Abnormal   Collection Time: 04/08/14  9:53 PM  Result Value Ref Range   Color, Urine YELLOW YELLOW   APPearance TURBID (A) CLEAR   Specific Gravity, Urine 1.016 1.005 - 1.030   pH 5.5 5.0 - 8.0   Glucose, UA NEGATIVE NEGATIVE mg/dL   Hgb urine dipstick LARGE (A) NEGATIVE   Bilirubin Urine NEGATIVE NEGATIVE   Ketones, ur NEGATIVE NEGATIVE mg/dL   Protein, ur NEGATIVE NEGATIVE mg/dL   Urobilinogen, UA 0.2 0.0 - 1.0 mg/dL   Nitrite NEGATIVE NEGATIVE   Leukocytes, UA LARGE (A) NEGATIVE  Urine microscopic-add on     Status: Abnormal   Collection Time: 04/08/14  9:53 PM  Result Value Ref Range   WBC, UA TOO NUMEROUS TO COUNT <3 WBC/hpf   RBC / HPF TOO NUMEROUS TO COUNT <3 RBC/hpf   Bacteria, UA MANY (A) RARE  Comprehensive metabolic panel     Status: Abnormal   Collection Time: 04/09/14  5:40 AM  Result Value Ref Range   Sodium 138 135 - 145 mmol/L   Potassium 3.9 3.5 - 5.1 mmol/L   Chloride 104 96 - 112 mmol/L   CO2 25 19 - 32 mmol/L   Glucose, Bld 96 70 - 99 mg/dL   BUN 17 6 - 23 mg/dL   Creatinine, Ser 1.10 0.50 - 1.35 mg/dL   Calcium 8.2 (L) 8.4 - 10.5 mg/dL   Total Protein 6.1 6.0 - 8.3 g/dL   Albumin 3.2 (L) 3.5 - 5.2 g/dL   AST 22 0 - 37 U/L   ALT  18 0 - 53 U/L   Alkaline Phosphatase 123 (H) 39 - 117 U/L   Total Bilirubin 1.4 (H) 0.3 - 1.2 mg/dL   GFR calc non Af Amer 62 (L) >90 mL/min   GFR calc Af Amer 72 (L) >90 mL/min    Comment: (NOTE) The eGFR has been calculated using the CKD EPI equation. This calculation has not been validated in all clinical situations. eGFR's persistently <90 mL/min signify possible Chronic Kidney Disease.    Anion gap 9 5 - 15  CBC      Status: Abnormal   Collection Time: 04/09/14  5:40 AM  Result Value Ref Range   WBC 6.0 4.0 - 10.5 K/uL   RBC 3.36 (L) 4.22 - 5.81 MIL/uL   Hemoglobin 11.1 (L) 13.0 - 17.0 g/dL   HCT 32.7 (L) 39.0 - 52.0 %   MCV 97.3 78.0 - 100.0 fL   MCH 33.0 26.0 - 34.0 pg   MCHC 33.9 30.0 - 36.0 g/dL   RDW 16.5 (H) 11.5 - 15.5 %   Platelets 142 (L) 150 - 400 K/uL  Protime-INR     Status: Abnormal   Collection Time: 04/09/14  5:40 AM  Result Value Ref Range   Prothrombin Time 17.2 (H) 11.6 - 15.2 seconds   INR 1.39 0.00 - 1.49  APTT     Status: None   Collection Time: 04/09/14  5:40 AM  Result Value Ref Range   aPTT 37 24 - 37 seconds    Comment:        IF BASELINE aPTT IS ELEVATED, SUGGEST PATIENT RISK ASSESSMENT BE USED TO DETERMINE APPROPRIATE ANTICOAGULANT THERAPY.     Imaging / Studies: Ct Abdomen Pelvis W Contrast  04/08/2014   CLINICAL DATA:  Rectal bleeding since last night. History of colonic abscess.  EXAM: CT ABDOMEN AND PELVIS WITH CONTRAST  TECHNIQUE: Multidetector CT imaging of the abdomen and pelvis was performed using the standard protocol following bolus administration of intravenous contrast.  CONTRAST:  145m OMNIPAQUE IOHEXOL 300 MG/ML SOLN, 272mOMNIPAQUE IOHEXOL 300 MG/ML SOLN  COMPARISON:  Most recent CT 03/10/2014, CT 01/14/2014 also reviewed  FINDINGS: Mild cardiomegaly noted at the lung bases. No parenchymal consolidation or pleural effusion.  There is worsening inflammation surrounding sigmoid diverticula with increased inflammation surrounding the previously seen fluid collection. Fluid collection measures 2.5 x 2.2 cm, previously 2.3 x 2.0 cm. There is increasing surrounding inflammatory change and bladder wall thickening. Air is again seen within the urinary bladder with wall thickening adjacent to the fluid collection. No new abscess. Additional extensive noninflamed diverticula throughout the colon are unchanged. No small bowel dilatation. The stomach is decompressed.  The appendix is normal.  There are no focal hepatic lesions. Gallstones again noted within physiologically distended gallbladder. Spleen and adrenal glands are normal. Pancreatic atrophy with focal calcification in the pancreatic head, unchanged from prior exam.  There is bilateral renal cortical thinning, left greater than right. Stones are seen within both kidneys with questionable caliceal diverticulum on the left. There is no hydronephrosis. Stone in the distal left ureter is again suspected measuring 6 mm, unchanged from prior, no resultant ureteral dilatation.  Atherosclerotic calcifications without aneurysm. No retroperitoneal adenopathy. Fat containing periumbilical hernia. Enlarged prostate gland, similar to prior. No pelvic free fluid.  Left hip prosthesis again seen. Degenerative change of the spine and right hip. No acute or suspicious osseous abnormalities.  IMPRESSION: 1. Increased size of the peri-diverticular/colonic fluid collection in the sigmoid colon, currently measures 2.5  x 2.2 cm. The degree of surrounding soft tissue inflammation has increased compared to prior concerning for acute inflammation. 2. 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 The degree of bladder wall thickening has increased from prior. 3. Additional stable chronic findings include bilateral nephrolithiasis, stone in the distal left ureter, unchanged. Cholelithiasis, fat containing ventral abdominal wall hernia, and atherosclerosis.   Electronically Signed   By: Jeb Levering M.D.   On: 04/08/2014 23:27   Dg Chest Port 1 View  04/08/2014   CLINICAL DATA:  Acute onset of diarrhea and rectal bleeding. Initial encounter.  EXAM: PORTABLE CHEST - 1 VIEW  COMPARISON:  Chest radiograph performed 03/25/2013  FINDINGS: The lungs are well-aerated. Pulmonary vascularity is at the upper limits of normal. Minimal  bibasilar atelectasis is noted. There is no evidence of pleural effusion or pneumothorax.  The cardiomediastinal silhouette is borderline enlarged. No acute osseous abnormalities are seen.  IMPRESSION: Minimal bibasilar atelectasis noted.  Borderline cardiomegaly.   Electronically Signed   By: Garald Balding M.D.   On: 04/08/2014 21:25   Dg Abd Portable 1v  04/08/2014   CLINICAL DATA:  Rectal bleeding, history of colonic abscess.  EXAM: PORTABLE ABDOMEN - 1 VIEW  COMPARISON:  CT 03/10/2014  FINDINGS: There is increased air throughout normal caliber small and large bowel loops throughout the abdomen. No bowel dilatation. No evidence of free intra-abdominal air on supine views. There is contrast within multiple diverticula in the left and distal colon from prior CT. Air within the central pelvis may be within the rectum versus air in the bladder, as seen on prior CT. Vascular calcifications are seen. Left hip prosthesis in place.  IMPRESSION: 1. Air-filled normal caliber small and large bowel loops throughout the abdomen. No bowel dilatation to suggest obstruction or evidence of free air. 2. Air in the central pelvis may be within the rectum or urinary bladder as seen on prior CT.   Electronically Signed   By: Jeb Levering M.D.   On: 04/08/2014 21:27    Medications / Allergies:  Scheduled Meds: . doxazosin  8 mg Oral QHS  . ezetimibe  10 mg Oral Daily  . fluconazole (DIFLUCAN) IV  200 mg Intravenous Q24H  . levothyroxine  100 mcg Oral Daily  . metoprolol succinate  50 mg Oral Daily  . piperacillin-tazobactam (ZOSYN)  IV  3.375 g Intravenous Q8H  . saccharomyces boulardii  250 mg Oral Daily  . sodium chloride  3 mL Intravenous Q12H   Continuous Infusions: . dextrose 5 % and 0.45% NaCl 50 mL/hr at 04/09/14 0946   PRN Meds:.acetaminophen **OR** acetaminophen, ondansetron **OR** ondansetron (ZOFRAN) IV  Antibiotics: Anti-infectives    Start     Dose/Rate Route Frequency Ordered Stop   04/09/14  1000  piperacillin-tazobactam (ZOSYN) IVPB 3.375 g     3.375 g 12.5 mL/hr over 240 Minutes Intravenous Every 8 hours 04/09/14 0127     04/09/14 1000  fluconazole (DIFLUCAN) IVPB 200 mg     200 mg 100 mL/hr over 60 Minutes Intravenous Every 24 hours 04/09/14 0759     04/09/14 0030  piperacillin-tazobactam (ZOSYN) IVPB 3.375 g     3.375 g 100 mL/hr over 30 Minutes Intravenous  Once 04/09/14 0016 04/09/14 0150        Assessment/Plan 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  Hopefully, he will improve and we can do a one step surgery in 2-3 months.  Seen by Dr. Harlow Asa at previous admission.  Also followed by Dr. Deidre Ala, [facility] Pager [phone](7A-4:30P)   04/09/2014 10:25 AM

## 2014-04-09 NOTE — Progress Notes (Signed)
INITIAL NUTRITION ASSESSMENT  DOCUMENTATION CODES Per approved criteria  -Obesity Unspecified   INTERVENTION: - Once diet advanced, add Ensure Enlive po BID, each supplement provides 350 kcal and 20 grams of protein  NUTRITION DIAGNOSIS: Inadequate oral intake related to poor appetite as evidenced by wt loss.   Goal: Pt to meet >/= 90% of their estimated nutrition needs   Monitor:  Weight trends, po intake, acceptance of supplements, labs  Reason for Assessment: Malnutrition Screening Tool  81 y.o. male  Admitting Dx: Colonic diverticular abscess  ASSESSMENT: 81 y.o. male with Past medical history of hypertension, coronary artery disease, morbid obesity, cardiomyopathy, combined systolic and diastolic heart failure, A. fib on Xarelto, sleep apnea. The patient presented with complaints of bright red blood per rectum. He mentions that he had 3 episodes of bowel movements which are loose and watery and along with that he had noticed a bright red blood.  - Pt with sigmoid abscess. Conservative management per surgery at this time.  - Pt reports a 57 lb wt loss in the past 1 year and 3 months. Per chart history, pt has lost 16% of his body weight in the past year- not significant for time frame.  - Current diet is clear liquids. Pt tolerating. Eating 90-100%.  - Reports that appetite has been poor for >1 year.  - No signs of fat or muscle depletion.  - Will order nutritional supplements once diet advanced.  - Labs reviewed- Albumin low  Height: Ht Readings from Last 1 Encounters:  04/09/14 5\' 7"  (1.702 m)    Weight: Wt Readings from Last 1 Encounters:  04/09/14 207 lb 12.8 oz (94.257 kg)    Ideal Body Weight: 66.1 kg  % Ideal Body Weight: 143%  Wt Readings from Last 10 Encounters:  04/09/14 207 lb 12.8 oz (94.257 kg)  01/17/14 214 lb 8.1 oz (97.3 kg)  12/09/13 227 lb 12.8 oz (103.329 kg)  11/04/13 224 lb (101.606 kg)  09/04/13 225 lb (102.059 kg)  09/02/13 225 lb 3.2  oz (102.15 kg)  07/21/13 237 lb 9.6 oz (107.775 kg)  06/30/13 232 lb (105.235 kg)  06/25/13 235 lb (106.595 kg)  03/25/13 245 lb (111.131 kg)    Usual Body Weight: 264 lbs, 1 yr, 3 mos ago  % Usual Body Weight: 78%  BMI:  Body mass index is 32.54 kg/(m^2).  Estimated Nutritional Needs: Kcal: 2000-2200 Protein: 85-100 g Fluid: 2.2 L/day  Skin: intact  Diet Order: Diet clear liquid  EDUCATION NEEDS: -Education needs addressed   Intake/Output Summary (Last 24 hours) at 04/09/14 1239 Last data filed at 04/09/14 0500  Gross per 24 hour  Intake   1050 ml  Output    150 ml  Net    900 ml    Last BM: prior to admission   Labs:   Recent Labs Lab 04/08/14 2023 04/09/14 0540  NA 136 138  K 3.7 3.9  CL 103 104  CO2 23 25  BUN 19 17  CREATININE 1.00 1.10  CALCIUM 8.8 8.2*  GLUCOSE 119* 96    CBG (last 3)  No results for input(s): GLUCAP in the last 72 hours.  Scheduled Meds: . doxazosin  8 mg Oral QHS  . ezetimibe  10 mg Oral Daily  . fluconazole (DIFLUCAN) IV  200 mg Intravenous Q24H  . levothyroxine  100 mcg Oral Daily  . metoprolol succinate  50 mg Oral Daily  . piperacillin-tazobactam (ZOSYN)  IV  3.375 g Intravenous Q8H  .  saccharomyces boulardii  250 mg Oral Daily  . sodium chloride  3 mL Intravenous Q12H    Continuous Infusions:   Past Medical History  Diagnosis Date  . Dyslipidemia   . HTN (hypertension)   . Obesity   . CAD (coronary artery disease)     Silent MI sometime prior to 2000. S/P cath in 2000 showing a total mid RCA with collaterals from the LCX. EF is 40 to50%; Last Myoview in 2010 showing inferior scar without ischemia. EF was 52%.  . Increased liver enzymes     felt to be a fatty liver per ultrasound in 2011  . Dyspnea   . Ischemic cardiomyopathy   . Spinal stenosis     Past Surgical History  Procedure Laterality Date  . Total hip arthroplasty    . Eye surgery    . Lower back surgery      spinal stenosis  . Shoulder  surgery    . Cardioversion N/A 06/06/2012    Procedure: CARDIOVERSION;  Surgeon: Thayer Headings, MD;  Location: [facility];  Service: Cardiovascular;  Laterality: N/A;    Laurette Schimke [name], [place], [place]

## 2014-04-09 NOTE — Progress Notes (Signed)
The patient had 5 beats of V-tach. PCP on call was notified.

## 2014-04-09 NOTE — Care Management Note (Signed)
    Page 1 of 1   04/09/2014     2:39:03 PM CARE MANAGEMENT NOTE 04/09/2014  Patient:  Harold Jennings, Harold Jennings   Account Number:  [id number]  Date Initiated:  04/09/2014  Documentation initiated by:  Dessa Phi  Subjective/Objective Assessment:   81 y/o m admitted w/diverticular abscess.     Action/Plan:   From home.   Anticipated DC Date:  04/13/2014   Anticipated DC Plan:  [place]  CM consult      Choice offered to / List presented to:             Status of service:  In process, will continue to follow Medicare Important Message given?   (If response is "NO", the following Medicare IM given date fields will be blank) Date Medicare IM given:   Medicare IM given by:   Date Additional Medicare IM given:   Additional Medicare IM given by:    Discharge Disposition:    Per UR Regulation:    If discussed at Long Length of Stay Meetings, dates discussed:    Comments:  04/09/14 Dessa Phi RN BSN NCM [phone] IR-unsuccessful w/drainage.sx following-may have fistula.iv abx.Monitor progress for d/c needs.

## 2014-04-10 LAB — BASIC METABOLIC PANEL
Anion gap: 6 (ref 5–15)
BUN: 13 mg/dL (ref 6–23)
CALCIUM: 8 mg/dL — AB (ref 8.4–10.5)
CO2: 26 mmol/L (ref 19–32)
CREATININE: 1.26 mg/dL (ref 0.50–1.35)
Chloride: 105 mmol/L (ref 96–112)
GFR calc non Af Amer: 52 mL/min — ABNORMAL LOW (ref >90)
GFR, EST AFRICAN AMERICAN: 61 mL/min — AB (ref >90)
Glucose, Bld: 87 mg/dL (ref 70–99)
Potassium: 3.6 mmol/L (ref 3.5–5.1)
SODIUM: 137 mmol/L (ref 135–145)

## 2014-04-10 LAB — CBC
HEMATOCRIT: 32 % — AB (ref 39.0–52.0)
Hemoglobin: 10.8 g/dL — ABNORMAL LOW (ref 13.0–17.0)
MCH: 33.2 pg (ref 26.0–34.0)
MCHC: 33.8 g/dL (ref 30.0–36.0)
MCV: 98.5 fL (ref 78.0–100.0)
PLATELETS: 166 10*3/uL (ref 150–400)
RBC: 3.25 MIL/uL — ABNORMAL LOW (ref 4.22–5.81)
RDW: 16.3 % — ABNORMAL HIGH (ref 11.5–15.5)
WBC: 7 10*3/uL (ref 4.0–10.5)

## 2014-04-10 MED ORDER — ALUM & MAG HYDROXIDE-SIMETH 200-200-20 MG/5ML PO SUSP: 30.0000 mL | Freq: Four times a day (QID) | ORAL | Status: DC | PRN

## 2014-04-10 MED ORDER — DIPHENHYDRAMINE HCL 50 MG/ML IJ SOLN
12.5000 mg | Freq: Four times a day (QID) | INTRAMUSCULAR | Status: DC | PRN
  Administered 2014-04-10: 25 mg via INTRAVENOUS
  Filled 2014-04-10: qty 1

## 2014-04-10 MED ORDER — SODIUM CHLORIDE 0.9 % IJ SOLN: 3.0000 mL | INTRAMUSCULAR | Status: DC | PRN

## 2014-04-10 MED ORDER — LIP MEDEX EX OINT
1.0000 "application " | TOPICAL_OINTMENT | Freq: Two times a day (BID) | CUTANEOUS | Status: DC
  Administered 2014-04-10 – 2014-04-11 (×3): 1 via TOPICAL
  Filled 2014-04-10: qty 7

## 2014-04-10 MED ORDER — MENTHOL 3 MG MT LOZG: 1.0000 | LOZENGE | OROMUCOSAL | Status: DC | PRN

## 2014-04-10 MED ORDER — PROMETHAZINE HCL 25 MG/ML IJ SOLN: 6.2500 mg | INTRAMUSCULAR | Status: DC | PRN

## 2014-04-10 MED ORDER — MAGIC MOUTHWASH: 15.0000 mL | Freq: Four times a day (QID) | ORAL | Status: DC | PRN

## 2014-04-10 MED ORDER — PHENOL 1.4 % MT LIQD: 2.0000 | OROMUCOSAL | Status: DC | PRN

## 2014-04-10 MED ORDER — SODIUM CHLORIDE 0.9 % IV SOLN: 250.0000 mL | INTRAVENOUS | Status: DC | PRN

## 2014-04-10 MED ORDER — SODIUM CHLORIDE 0.9 % IJ SOLN
3.0000 mL | Freq: Two times a day (BID) | INTRAMUSCULAR | Status: DC
  Administered 2014-04-10: 3 mL via INTRAVENOUS

## 2014-04-10 MED ORDER — GUAIFENESIN 100 MG/5ML PO SOLN: 400.0000 mg | Freq: Four times a day (QID) | ORAL | Status: DC | PRN

## 2014-04-10 MED ORDER — LACTATED RINGERS IV BOLUS (SEPSIS): 1000.0000 mL | Freq: Three times a day (TID) | INTRAVENOUS | Status: DC | PRN

## 2014-04-10 MED ORDER — ADULT MULTIVITAMIN W/MINERALS CH
1.0000 | ORAL_TABLET | Freq: Every day | ORAL | Status: DC
  Administered 2014-04-10 – 2014-04-11 (×2): 1 via ORAL
  Filled 2014-04-10 (×2): qty 1

## 2014-04-10 NOTE — Progress Notes (Signed)
Spoke with Mickel Baas with infection prevention regarding c-diff protocol. Pt has has two formed stools since admission. No loose bowel movements. Consulted with Dr. Tana Coast and agreed ok to take off precautions.

## 2014-04-10 NOTE — Progress Notes (Signed)
[address] [place], [place] [address] Phone: [phone] FAX: [phone]    Harold Jennings [id number] 05-28-1932  CARE TEAM:  PCP: London Pepper, MD  Outpatient Care Team: Patient Care Team: London Pepper, MD as PCP - General (Family Medicine)  Inpatient Treatment Team: Treatment Team: Attending Provider: Mendel Corning, MD; Consulting Physician: Nolon Nations, MD; Registered Nurse: Collene Gobble, RN; Rounding Team: Threasa Beards, MD; Registered Nurse: Gentry Roch, RN; Technician: Doyle Askew, NT; Technician: Tamera Punt, [place]; Technician: Emmaline Kluver, NT; Technician: Annette Stable, NT; Registered Nurse: Jacquelin Hawking, RN; Registered Nurse: Nolene Ebbs, RN; Technician: Demaris Callander, NT  Problem List:   Principal Problem:   Colonic diverticular abscess Active Problems:   Essential hypertension   GERD   Chronic diastolic heart failure, NYHA class 3   Atrial fibrillation   Sigmoid diverticulitis   BRBPR (bright red blood per rectum)        Assessment  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  Plan:  I had a long discussion with the patient.  I think he is failing nonoperative management.  I think he will require sigmoid colectomy at some point.  Repair fistula.  The abscess is not able to be percutaneously drained.  However it is small enough that hopefully antibiotics alone will work.    I strongly recommend a CAT scan in 5-7 days from last one to see if abscess as resolved or worsen.  Tentatively planned for Monday the 28th.  Good transition to outpatient CT scan if he is discharged by then.  Reasonable to advance diet since he is no longer symptomatic.  A few minutes meets discharge goals with tolerating solid food, having flatus, no abdominal pain; then, transition to oral antibiotics.  Would try Cipro and Flagyl since he failed on  Augmentin.  Most likely would need to stay on that until he has elective colonic resection in 6-8 weeks  If the abscess is stable or smaller on follow-up CT scan and he remains asymptomatic, plan elective sigmoid colectomy in 6-8 weeks.  Probable repair of fistula to bladder.  Most likely small and will just require simple oversew and not a true resection  since he does not have severe pneumaturia anymore.  Hopefully at that point the abscess will be gone or so well contained that it will be safe to do anastomosis and avoid a colostomy.  He would like to avoid a colostomy as possible.  Most likely could extract through his umbilical hernia and repair primarily.  If he markedly worsens this admission, laparoscopic washout and drains versus Hartmann procedure with colectomy/colostomy to save his life.  If the abscess looks markedly larger or worsens after discharge, laparoscopic washout and drains versus Hartmann procedure with colectomy/colostomy to save his life.  I explained the reasoning for surgery.  I again stressed to try to avoid surgery unless he has failed and a box only.  He has a complex attack with a fistula and recurrent abscess.  I think he will require surgery.  Understandably is an increased risk but not massively prohibitive.  While he is almost 82 years oh he has a good quality of life and uses a cane only for balance.  He has she is on blood thinners but so are a fair percentage of elderly folks.  He was not prohibited from surgery by his cardiologist the last time this was discussed  a few weeks ago.  Hopefully we can advance his diet and transitioned to oral antibiotics.  He then can again discuss with Dr. Rene Kocher, his initial and preferred surgeon, his cardiologist, and his urologist.  The patient feels reassured.  -adv diet for now -stop IVF -Continue antibiotics.  Would use Zosyn and fluconazole given recurrence and failure on prior more simple regimen See what his urine culture  shows.  May adjust that depending if there is a resistant organism. -VTE prophylaxis- SCDs, etc -mobilize as tolerated to help recovery  [name], M.D., F.A.C.S. Gastrointestinal and Minimally Invasive Surgery [facility] [address] [place], [place] [address] [phone] Main / Paging   04/10/2014  Subjective:  Pt not wanting any surgery  Objective:  Vital signs:  Filed Vitals:   04/09/14 0947 04/09/14 1600 04/09/14 2051 04/10/14 0535  BP: 115/42 126/63 117/73 147/79  Pulse: 80 78 89 70  Temp:  97.7 F (36.5 C) 98.1 F (36.7 C) 97.9 F (36.6 C)  TempSrc:  Oral Oral Oral  Resp:  '20 18 18  ' Height:      Weight:    92.7 kg (204 lb 5.9 oz)  SpO2:  95% 94% 98%    Last BM Date: 04/07/14  Intake/Output   Yesterday:  03/24 0701 - 03/25 0700 In: -  Out: 1101 [Urine:1100; Stool:1] This shift:  Total I/O In: 600 [P.O.:600] Out: 250 [Urine:250]  Bowel function:  Flatus: y  BM: y  Drain: n/a  Physical Exam:  General: Pt awake/alert/oriented x4 in no acute distress Eyes: PERRL, normal EOM.  Sclera clear.  No icterus Neuro: CN II-XII intact w/o focal sensory/motor deficits. Lymph: No head/neck/groin lymphadenopathy Psych:  No delerium/psychosis/paranoia HENT: Normocephalic, Mucus membranes moist.  No thrush Neck: Supple, No tracheal deviation Chest: No chest wall pain w good excursion CV:  Pulses intact.  Regular rhythm MS: Normal AROM mjr joints.  No obvious deformity Abdomen: Soft.  Nondistended.  Mildly tender at suprapubic region.  No evidence of peritonitis.  Lobulated 6 cm mass consistent with chronically incarcerated umbilical hernia.  No ischemia.  Unchanged. Ext:  SCDs BLE.  No mjr edema.  No cyanosis Skin: No petechiae / purpura  Results:   Labs: Results for orders placed or performed during the hospital encounter of 04/08/14 (from the past 48 hour(s))  Comprehensive metabolic panel     Status: Abnormal    Collection Time: 04/08/14  8:23 PM  Result Value Ref Range   Sodium 136 135 - 145 mmol/L   Potassium 3.7 3.5 - 5.1 mmol/L   Chloride 103 96 - 112 mmol/L   CO2 23 19 - 32 mmol/L   Glucose, Bld 119 (H) 70 - 99 mg/dL   BUN 19 6 - 23 mg/dL   Creatinine, Ser 1.00 0.50 - 1.35 mg/dL   Calcium 8.8 8.4 - 10.5 mg/dL   Total Protein 6.9 6.0 - 8.3 g/dL   Albumin 3.5 3.5 - 5.2 g/dL   AST 28 0 - 37 U/L   ALT 22 0 - 53 U/L   Alkaline Phosphatase 134 (H) 39 - 117 U/L   Total Bilirubin 0.9 0.3 - 1.2 mg/dL   GFR calc non Af Amer 69 (L) >90 mL/min   GFR calc Af Amer 80 (L) >90 mL/min    Comment: (NOTE) The eGFR has been calculated using the CKD EPI equation. This calculation has not been validated in all clinical situations. eGFR's persistently <90 mL/min signify possible Chronic  Kidney Disease.    Anion gap 10 5 - 15  Type and screen     Status: None   Collection Time: 04/08/14  8:23 PM  Result Value Ref Range   ABO/RH(D) B NEG    Antibody Screen NEG    Sample Expiration 04/11/2014   Lipase, blood     Status: None   Collection Time: 04/08/14  8:23 PM  Result Value Ref Range   Lipase 51 11 - 59 U/L  CBC with Differential/Platelet     Status: Abnormal   Collection Time: 04/08/14  8:23 PM  Result Value Ref Range   WBC 7.2 4.0 - 10.5 K/uL   RBC 3.57 (L) 4.22 - 5.81 MIL/uL   Hemoglobin 11.9 (L) 13.0 - 17.0 g/dL   HCT 34.5 (L) 39.0 - 52.0 %   MCV 96.6 78.0 - 100.0 fL   MCH 33.3 26.0 - 34.0 pg   MCHC 34.5 30.0 - 36.0 g/dL   RDW 16.1 (H) 11.5 - 15.5 %   Platelets 161 150 - 400 K/uL   Neutrophils Relative % 60 43 - 77 %   Neutro Abs 4.3 1.7 - 7.7 K/uL   Lymphocytes Relative 28 12 - 46 %   Lymphs Abs 2.0 0.7 - 4.0 K/uL   Monocytes Relative 9 3 - 12 %   Monocytes Absolute 0.7 0.1 - 1.0 K/uL   Eosinophils Relative 3 0 - 5 %   Eosinophils Absolute 0.2 0.0 - 0.7 K/uL   Basophils Relative 0 0 - 1 %   Basophils Absolute 0.0 0.0 - 0.1 K/uL  Protime-INR     Status: Abnormal   Collection  Time: 04/08/14  8:23 PM  Result Value Ref Range   Prothrombin Time 18.3 (H) 11.6 - 15.2 seconds   INR 1.50 (H) 0.00 - 1.49  APTT     Status: Abnormal   Collection Time: 04/08/14  8:23 PM  Result Value Ref Range   aPTT 39 (H) 24 - 37 seconds    Comment:        IF BASELINE aPTT IS ELEVATED, SUGGEST PATIENT RISK ASSESSMENT BE USED TO DETERMINE APPROPRIATE ANTICOAGULANT THERAPY.   ABO/Rh     Status: None   Collection Time: 04/08/14  8:23 PM  Result Value Ref Range   ABO/RH(D) B NEG   POC occult blood, ED Provider will collect     Status: Abnormal   Collection Time: 04/08/14  8:47 PM  Result Value Ref Range   Fecal Occult Bld POSITIVE (A) NEGATIVE  Urinalysis, Routine w reflex microscopic     Status: Abnormal   Collection Time: 04/08/14  9:53 PM  Result Value Ref Range   Color, Urine YELLOW YELLOW   APPearance TURBID (A) CLEAR   Specific Gravity, Urine 1.016 1.005 - 1.030   pH 5.5 5.0 - 8.0   Glucose, UA NEGATIVE NEGATIVE mg/dL   Hgb urine dipstick LARGE (A) NEGATIVE   Bilirubin Urine NEGATIVE NEGATIVE   Ketones, ur NEGATIVE NEGATIVE mg/dL   Protein, ur NEGATIVE NEGATIVE mg/dL   Urobilinogen, UA 0.2 0.0 - 1.0 mg/dL   Nitrite NEGATIVE NEGATIVE   Leukocytes, UA LARGE (A) NEGATIVE  Urine microscopic-add on     Status: Abnormal   Collection Time: 04/08/14  9:53 PM  Result Value Ref Range   WBC, UA TOO NUMEROUS TO COUNT <3 WBC/hpf   RBC / HPF TOO NUMEROUS TO COUNT <3 RBC/hpf   Bacteria, UA MANY (A) RARE  Urine culture     Status:  None (Preliminary result)   Collection Time: 04/08/14  9:53 PM  Result Value Ref Range   Specimen Description URINE, CLEAN CATCH    Special Requests Normal    Colony Count      >=100,000 COLONIES/ML Performed at [facility] Culture reincubated for better growth Performed at [facility]    Report Status PENDING   Comprehensive metabolic panel     Status: Abnormal   Collection Time: 04/09/14   5:40 AM  Result Value Ref Range   Sodium 138 135 - 145 mmol/L   Potassium 3.9 3.5 - 5.1 mmol/L   Chloride 104 96 - 112 mmol/L   CO2 25 19 - 32 mmol/L   Glucose, Bld 96 70 - 99 mg/dL   BUN 17 6 - 23 mg/dL   Creatinine, Ser 1.10 0.50 - 1.35 mg/dL   Calcium 8.2 (L) 8.4 - 10.5 mg/dL   Total Protein 6.1 6.0 - 8.3 g/dL   Albumin 3.2 (L) 3.5 - 5.2 g/dL   AST 22 0 - 37 U/L   ALT 18 0 - 53 U/L   Alkaline Phosphatase 123 (H) 39 - 117 U/L   Total Bilirubin 1.4 (H) 0.3 - 1.2 mg/dL   GFR calc non Af Amer 62 (L) >90 mL/min   GFR calc Af Amer 72 (L) >90 mL/min    Comment: (NOTE) The eGFR has been calculated using the CKD EPI equation. This calculation has not been validated in all clinical situations. eGFR's persistently <90 mL/min signify possible Chronic Kidney Disease.    Anion gap 9 5 - 15  CBC     Status: Abnormal   Collection Time: 04/09/14  5:40 AM  Result Value Ref Range   WBC 6.0 4.0 - 10.5 K/uL   RBC 3.36 (L) 4.22 - 5.81 MIL/uL   Hemoglobin 11.1 (L) 13.0 - 17.0 g/dL   HCT 32.7 (L) 39.0 - 52.0 %   MCV 97.3 78.0 - 100.0 fL   MCH 33.0 26.0 - 34.0 pg   MCHC 33.9 30.0 - 36.0 g/dL   RDW 16.5 (H) 11.5 - 15.5 %   Platelets 142 (L) 150 - 400 K/uL  Protime-INR     Status: Abnormal   Collection Time: 04/09/14  5:40 AM  Result Value Ref Range   Prothrombin Time 17.2 (H) 11.6 - 15.2 seconds   INR 1.39 0.00 - 1.49  APTT     Status: None   Collection Time: 04/09/14  5:40 AM  Result Value Ref Range   aPTT 37 24 - 37 seconds    Comment:        IF BASELINE aPTT IS ELEVATED, SUGGEST PATIENT RISK ASSESSMENT BE USED TO DETERMINE APPROPRIATE ANTICOAGULANT THERAPY.   Basic metabolic panel     Status: Abnormal   Collection Time: 04/10/14  5:15 AM  Result Value Ref Range   Sodium 137 135 - 145 mmol/L   Potassium 3.6 3.5 - 5.1 mmol/L   Chloride 105 96 - 112 mmol/L   CO2 26 19 - 32 mmol/L   Glucose, Bld 87 70 - 99 mg/dL   BUN 13 6 - 23 mg/dL   Creatinine, Ser 1.26 0.50 - 1.35 mg/dL    Calcium 8.0 (L) 8.4 - 10.5 mg/dL   GFR calc non Af Amer 52 (L) >90 mL/min   GFR calc Af Amer 61 (L) >90 mL/min    Comment: (NOTE) The eGFR has been calculated using  the CKD EPI equation. This calculation has not been validated in all clinical situations. eGFR's persistently <90 mL/min signify possible Chronic Kidney Disease.    Anion gap 6 5 - 15  CBC     Status: Abnormal   Collection Time: 04/10/14  5:15 AM  Result Value Ref Range   WBC 7.0 4.0 - 10.5 K/uL   RBC 3.25 (L) 4.22 - 5.81 MIL/uL   Hemoglobin 10.8 (L) 13.0 - 17.0 g/dL   HCT 32.0 (L) 39.0 - 52.0 %   MCV 98.5 78.0 - 100.0 fL   MCH 33.2 26.0 - 34.0 pg   MCHC 33.8 30.0 - 36.0 g/dL   RDW 16.3 (H) 11.5 - 15.5 %   Platelets 166 150 - 400 K/uL    Imaging / Studies: Ct Abdomen Pelvis W Contrast  04/08/2014   CLINICAL DATA:  Rectal bleeding since last night. History of colonic abscess.  EXAM: CT ABDOMEN AND PELVIS WITH CONTRAST  TECHNIQUE: Multidetector CT imaging of the abdomen and pelvis was performed using the standard protocol following bolus administration of intravenous contrast.  CONTRAST:  190m OMNIPAQUE IOHEXOL 300 MG/ML SOLN, 244mOMNIPAQUE IOHEXOL 300 MG/ML SOLN  COMPARISON:  Most recent CT 03/10/2014, CT 01/14/2014 also reviewed  FINDINGS: Mild cardiomegaly noted at the lung bases. No parenchymal consolidation or pleural effusion.  There is worsening inflammation surrounding sigmoid diverticula with increased inflammation surrounding the previously seen fluid collection. Fluid collection measures 2.5 x 2.2 cm, previously 2.3 x 2.0 cm. There is increasing surrounding inflammatory change and bladder wall thickening. Air is again seen within the urinary bladder with wall thickening adjacent to the fluid collection. No new abscess. Additional extensive noninflamed diverticula throughout the colon are unchanged. No small bowel dilatation. The stomach is decompressed. The appendix is normal.  There are no focal hepatic lesions.  Gallstones again noted within physiologically distended gallbladder. Spleen and adrenal glands are normal. Pancreatic atrophy with focal calcification in the pancreatic head, unchanged from prior exam.  There is bilateral renal cortical thinning, left greater than right. Stones are seen within both kidneys with questionable caliceal diverticulum on the left. There is no hydronephrosis. Stone in the distal left ureter is again suspected measuring 6 mm, unchanged from prior, no resultant ureteral dilatation.  Atherosclerotic calcifications without aneurysm. No retroperitoneal adenopathy. Fat containing periumbilical hernia. Enlarged prostate gland, similar to prior. No pelvic free fluid.  Left hip prosthesis again seen. Degenerative change of the spine and right hip. No acute or suspicious osseous abnormalities.  IMPRESSION: 1. Increased size of the peri-diverticular/colonic fluid collection in the sigmoid colon, currently measures 2.5 x 2.2 cm. The degree of surrounding soft tissue inflammation has increased compared to prior concerning for acute inflammation. 2. 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 The degree of bladder wall thickening has increased from prior. 3. Additional stable chronic findings include bilateral nephrolithiasis, stone in the distal left ureter, unchanged. Cholelithiasis, fat containing ventral abdominal wall hernia, and atherosclerosis.   Electronically Signed   By: MeJeb Levering.D.   On: 04/08/2014 23:27   Dg Chest Port 1 View  04/08/2014   CLINICAL DATA:  Acute onset of diarrhea and rectal bleeding. Initial encounter.  EXAM: PORTABLE CHEST - 1 VIEW  COMPARISON:  Chest radiograph performed 03/25/2013  FINDINGS: The lungs are well-aerated. Pulmonary vascularity is at the upper limits of normal. Minimal bibasilar atelectasis is noted. There is no evidence of pleural  effusion or pneumothorax.  The cardiomediastinal silhouette is borderline enlarged. No acute osseous abnormalities are seen.  IMPRESSION: Minimal bibasilar atelectasis noted.  Borderline cardiomegaly.   Electronically Signed   By: Garald Balding M.D.   On: 04/08/2014 21:25   Dg Abd Portable 1v  04/08/2014   CLINICAL DATA:  Rectal bleeding, history of colonic abscess.  EXAM: PORTABLE ABDOMEN - 1 VIEW  COMPARISON:  CT 03/10/2014  FINDINGS: There is increased air throughout normal caliber small and large bowel loops throughout the abdomen. No bowel dilatation. No evidence of free intra-abdominal air on supine views. There is contrast within multiple diverticula in the left and distal colon from prior CT. Air within the central pelvis may be within the rectum versus air in the bladder, as seen on prior CT. Vascular calcifications are seen. Left hip prosthesis in place.  IMPRESSION: 1. Air-filled normal caliber small and large bowel loops throughout the abdomen. No bowel dilatation to suggest obstruction or evidence of free air. 2. Air in the central pelvis may be within the rectum or urinary bladder as seen on prior CT.   Electronically Signed   By: Jeb Levering M.D.   On: 04/08/2014 21:27    Medications / Allergies: per chart  Antibiotics: Anti-infectives    Start     Dose/Rate Route Frequency Ordered Stop   04/09/14 1000  piperacillin-tazobactam (ZOSYN) IVPB 3.375 g     3.375 g 12.5 mL/hr over 240 Minutes Intravenous Every 8 hours 04/09/14 0127     04/09/14 1000  fluconazole (DIFLUCAN) IVPB 200 mg     200 mg 100 mL/hr over 60 Minutes Intravenous Every 24 hours 04/09/14 0759     04/09/14 0030  piperacillin-tazobactam (ZOSYN) IVPB 3.375 g     3.375 g 100 mL/hr over 30 Minutes Intravenous  Once 04/09/14 0016 04/09/14 0150       Note: Portions of this report may have been transcribed using voice recognition software. Every effort was made to ensure accuracy; however, inadvertent computerized  transcription errors may be present.   Any transcriptional errors that result from this process are unintentional.     [name], M.D., F.A.C.S. Gastrointestinal and Minimally Invasive Surgery [facility] [address], [place], [place] [address] [phone] Main / Paging   04/10/2014

## 2014-04-10 NOTE — Progress Notes (Signed)
[facility]                                                                              Patient Demographics  Harold Jennings, is a 81 y.o. male, DOB - 1932/10/11, MWU:[id number]  Admit date - 04/08/2014   Admitting Physician Lavina Hamman, MD  Outpatient Primary MD for the patient is London Pepper, MD  LOS - 1   Chief Complaint  Patient presents with  . Rectal Bleeding       Brief HPI   Harold Jennings is a 81 y.o. male with Past medical history of hypertension, coronary artery disease, morbid obesity, cardiomyopathy, combined systolic and diastolic heart failure, A. fib on Xarelto, sleep apnea. The patient presented with complaints of bright red blood per rectum. He had 3 episodes of bowel movements which are loose and watery and along with that he had noticed a bright red blood.   Assessment & Plan    Principal Problem:   BRBPR with sigmoid diverticulitis with diverticular abscess with possible colovesical fistula (also on anticoagulation outpatient) - CT abdomen and pelvis showed sigmoid abscess. General surgery was consulted and recommended conservative management at this time, IV fluids and IV antibiotics. - IR consulted, imaging studies were reviewed by Dr. Vernard Gambles and area is small in size and not accessible by percutaneous route - Continue IV Zosyn, clears - No pneumaturia, per surgery, if no improvement, will need a Hartman's procedure this admission, if improving, one-step surgery in 2-3 months. Patient is also followed by urology, Dr. Luberta Robertson, urology notified per patient's request however no urgent urological needs.   -Reviewed the surgery recommendations from Dr. Johney Maine and discussed with the patient. He wants to discuss the plan with surgery team and currently not interested in pursuing the surgery. Patient states that he is going to be 82 years on 4/4 and doesn't think he can handle a big surgery and recovery time. He wants to discuss it with the Dr.  Harlow Asa or the surgical team in detail. I will await further plans from surgical team.  Active Problems: Gram-negative rod UTI - Continue IV Zosyn, follow urine culture and sensitivities    Essential hypertension - Currently stable, hold diuretics    GERD -Continue PPI     Chronic diastolic heart failure, NYHA class 3 - Currently on clear liquid diet, hold diuretics to avoid dehydration. Will DC fluids    Atrial fibrillation - Continue Toprol-XL, hold xarelto   Code Status: FC  Family Communication: Discussed in detail with the patient, all imaging results, lab results explained to the patient    Disposition Plan:   Time Spent in minutes   25 minutes  Procedures  CT abd  Consults   Surgery IR  DVT Prophylaxis  SCD's  Medications  Scheduled Meds: . doxazosin  8 mg Oral QHS  . ezetimibe  10 mg Oral Daily  . fluconazole (DIFLUCAN) IV  200 mg Intravenous Q24H  . levothyroxine  100 mcg Oral Daily  . metoprolol succinate  50 mg Oral Daily  . piperacillin-tazobactam (ZOSYN)  IV  3.375 g Intravenous Q8H  . saccharomyces boulardii  250 mg Oral Daily  . sodium chloride  3 mL Intravenous Q12H   Continuous Infusions:   PRN Meds:.acetaminophen **OR** acetaminophen, ondansetron **OR** ondansetron (ZOFRAN) IV   Antibiotics   Anti-infectives    Start     Dose/Rate Route Frequency Ordered Stop   04/09/14 1000  piperacillin-tazobactam (ZOSYN) IVPB 3.375 g     3.375 g 12.5 mL/hr over 240 Minutes Intravenous Every 8 hours 04/09/14 0127     04/09/14 1000  fluconazole (DIFLUCAN) IVPB 200 mg     200 mg 100 mL/hr over 60 Minutes Intravenous Every 24 hours 04/09/14 0759     04/09/14 0030  piperacillin-tazobactam (ZOSYN) IVPB 3.375 g     3.375 g 100 mL/hr over 30 Minutes Intravenous  Once 04/09/14 0016 04/09/14 0150        Subjective:   Harold Jennings was seen and examined today. No fevers, chills, chest pain or shortness of breath, patient reports no bloody bowel  movements anymore, no abdominal pain or nausea or vomiting  Objective:   Blood pressure 147/79, pulse 70, temperature 97.9 F (36.6 C), temperature source Oral, resp. rate 18, height 5\' 7"  (1.702 m), weight 92.7 kg (204 lb 5.9 oz), SpO2 98 %.  Wt Readings from Last 3 Encounters:  04/10/14 92.7 kg (204 lb 5.9 oz)  01/17/14 97.3 kg (214 lb 8.1 oz)  12/09/13 103.329 kg (227 lb 12.8 oz)     Intake/Output Summary (Last 24 hours) at 04/10/14 1022 Last data filed at 04/10/14 0810  Gross per 24 hour  Intake      0 ml  Output   1351 ml  Net  -1351 ml    Exam  General: Alert and oriented x 3, NAD  HEENT:  PERRLA, EOMI, Anicteic Sclera, mucous membranes moist.   CVS: S1 S2 auscultated, no rubs, murmurs or gallops. Regular rate and rhythm.  Respiratory: Clear to auscultation bilaterally, no wheezing, rales or rhonchi  Abdomen: Soft, nontender, nondistended, + bowel sounds  Ext: no cyanosis clubbing or edema  Neuro: AAOx3, Cr N's II- XII. Strength 5/5 upper and lower extremities bilaterally  Skin: No rashes  Psych: Normal affect and demeanor, alert and oriented x3    Data Review   Micro Results Recent Results (from the past 240 hour(s))  Urine culture     Status: None (Preliminary result)   Collection Time: 04/08/14  9:53 PM  Result Value Ref Range Status   Specimen Description URINE, CLEAN CATCH  Final   Special Requests Normal  Final   Colony Count   Final    >=100,000 COLONIES/ML Performed at [facility]    Culture   Final    [place] Culture reincubated for better growth Performed at [facility]    Report Status PENDING  Incomplete    Radiology Reports Ct Abdomen Pelvis W Contrast  04/08/2014   CLINICAL DATA:  Rectal bleeding since last night. History of colonic abscess.  EXAM: CT ABDOMEN AND PELVIS WITH CONTRAST  TECHNIQUE: Multidetector CT imaging of the abdomen and pelvis was performed using the standard protocol following bolus  administration of intravenous contrast.  CONTRAST:  137mL OMNIPAQUE IOHEXOL 300 MG/ML SOLN, 39mL OMNIPAQUE IOHEXOL 300 MG/ML SOLN  COMPARISON:  Most recent CT 03/10/2014, CT 01/14/2014 also reviewed  FINDINGS: Mild cardiomegaly noted at the lung bases. No parenchymal consolidation or pleural effusion.  There is worsening inflammation surrounding sigmoid diverticula with increased inflammation surrounding the previously seen fluid collection. Fluid collection measures 2.5 x 2.2 cm, previously 2.3 x 2.0 cm. There is increasing  surrounding inflammatory change and bladder wall thickening. Air is again seen within the urinary bladder with wall thickening adjacent to the fluid collection. No new abscess. Additional extensive noninflamed diverticula throughout the colon are unchanged. No small bowel dilatation. The stomach is decompressed. The appendix is normal.  There are no focal hepatic lesions. Gallstones again noted within physiologically distended gallbladder. Spleen and adrenal glands are normal. Pancreatic atrophy with focal calcification in the pancreatic head, unchanged from prior exam.  There is bilateral renal cortical thinning, left greater than right. Stones are seen within both kidneys with questionable caliceal diverticulum on the left. There is no hydronephrosis. Stone in the distal left ureter is again suspected measuring 6 mm, unchanged from prior, no resultant ureteral dilatation.  Atherosclerotic calcifications without aneurysm. No retroperitoneal adenopathy. Fat containing periumbilical hernia. Enlarged prostate gland, similar to prior. No pelvic free fluid.  Left hip prosthesis again seen. Degenerative change of the spine and right hip. No acute or suspicious osseous abnormalities.  IMPRESSION: 1. Increased size of the peri-diverticular/colonic fluid collection in the sigmoid colon, currently measures 2.5 x 2.2 cm. The degree of surrounding soft tissue inflammation has increased compared to prior  concerning for acute inflammation. 2. 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 The degree of bladder wall thickening has increased from prior. 3. Additional stable chronic findings include bilateral nephrolithiasis, stone in the distal left ureter, unchanged. Cholelithiasis, fat containing ventral abdominal wall hernia, and atherosclerosis.   Electronically Signed   By: Jeb Levering M.D.   On: 04/08/2014 23:27   Dg Chest Port 1 View  04/08/2014   CLINICAL DATA:  Acute onset of diarrhea and rectal bleeding. Initial encounter.  EXAM: PORTABLE CHEST - 1 VIEW  COMPARISON:  Chest radiograph performed 03/25/2013  FINDINGS: The lungs are well-aerated. Pulmonary vascularity is at the upper limits of normal. Minimal bibasilar atelectasis is noted. There is no evidence of pleural effusion or pneumothorax.  The cardiomediastinal silhouette is borderline enlarged. No acute osseous abnormalities are seen.  IMPRESSION: Minimal bibasilar atelectasis noted.  Borderline cardiomegaly.   Electronically Signed   By: Garald Balding M.D.   On: 04/08/2014 21:25   Dg Abd Portable 1v  04/08/2014   CLINICAL DATA:  Rectal bleeding, history of colonic abscess.  EXAM: PORTABLE ABDOMEN - 1 VIEW  COMPARISON:  CT 03/10/2014  FINDINGS: There is increased air throughout normal caliber small and large bowel loops throughout the abdomen. No bowel dilatation. No evidence of free intra-abdominal air on supine views. There is contrast within multiple diverticula in the left and distal colon from prior CT. Air within the central pelvis may be within the rectum versus air in the bladder, as seen on prior CT. Vascular calcifications are seen. Left hip prosthesis in place.  IMPRESSION: 1. Air-filled normal caliber small and large bowel loops throughout the abdomen. No bowel dilatation to suggest obstruction or evidence of free air. 2.  Air in the central pelvis may be within the rectum or urinary bladder as seen on prior CT.   Electronically Signed   By: Jeb Levering M.D.   On: 04/08/2014 21:27    CBC  Recent Labs Lab 04/08/14 2023 04/09/14 0540 04/10/14 0515  WBC 7.2 6.0 7.0  HGB 11.9* 11.1* 10.8*  HCT 34.5* 32.7* 32.0*  PLT 161 142* 166  MCV 96.6 97.3 98.5  MCH 33.3 33.0 33.2  MCHC 34.5 33.9 33.8  RDW 16.1* 16.5*  16.3*  LYMPHSABS 2.0  --   --   MONOABS 0.7  --   --   EOSABS 0.2  --   --   BASOSABS 0.0  --   --     Chemistries   Recent Labs Lab 04/08/14 2023 04/09/14 0540 04/10/14 0515  NA 136 138 137  K 3.7 3.9 3.6  CL 103 104 105  CO2 23 25 26   GLUCOSE 119* 96 87  BUN 19 17 13   CREATININE 1.00 1.10 1.26  CALCIUM 8.8 8.2* 8.0*  AST 28 22  --   ALT 22 18  --   ALKPHOS 134* 123*  --   BILITOT 0.9 1.4*  --    ------------------------------------------------------------------------------------------------------------------ estimated creatinine clearance is 51.6 mL/min (by C-G formula based on Cr of 1.26). ------------------------------------------------------------------------------------------------------------------ No results for input(s): HGBA1C in the last 72 hours. ------------------------------------------------------------------------------------------------------------------ No results for input(s): CHOL, HDL, LDLCALC, TRIG, CHOLHDL, LDLDIRECT in the last 72 hours. ------------------------------------------------------------------------------------------------------------------ No results for input(s): TSH, T4TOTAL, T3FREE, THYROIDAB in the last 72 hours.  Invalid input(s): FREET3 ------------------------------------------------------------------------------------------------------------------ No results for input(s): VITAMINB12, FOLATE, FERRITIN, TIBC, IRON, RETICCTPCT in the last 72 hours.  Coagulation profile  Recent Labs Lab 04/08/14 2023 04/09/14 0540  INR 1.50* 1.39     No results for input(s): DDIMER in the last 72 hours.  Cardiac Enzymes No results for input(s): CKMB, TROPONINI, MYOGLOBIN in the last 168 hours.  Invalid input(s): CK ------------------------------------------------------------------------------------------------------------------ Invalid input(s): POCBNP  No results for input(s): GLUCAP in the last 72 hours.   RAI,RIPUDEEP M.D. [facility] 04/10/2014, 10:22 AM  Pager: [phone]   Between 7am to 7pm - call Pager - [phone]  After 7pm go to [email or web address] - password [email or web address]  Call night coverage person covering after 7pm

## 2014-04-11 LAB — BASIC METABOLIC PANEL
Anion gap: 9 (ref 5–15)
BUN: 10 mg/dL (ref 6–23)
CALCIUM: 8.3 mg/dL — AB (ref 8.4–10.5)
CHLORIDE: 105 mmol/L (ref 96–112)
CO2: 26 mmol/L (ref 19–32)
Creatinine, Ser: 1.28 mg/dL (ref 0.50–1.35)
GFR calc non Af Amer: 52 mL/min — ABNORMAL LOW (ref >90)
GFR, EST AFRICAN AMERICAN: 60 mL/min — AB (ref >90)
Glucose, Bld: 88 mg/dL (ref 70–99)
Potassium: 3.5 mmol/L (ref 3.5–5.1)
SODIUM: 140 mmol/L (ref 135–145)

## 2014-04-11 LAB — CBC
HEMATOCRIT: 33.1 % — AB (ref 39.0–52.0)
Hemoglobin: 10.9 g/dL — ABNORMAL LOW (ref 13.0–17.0)
MCH: 32.5 pg (ref 26.0–34.0)
MCHC: 32.9 g/dL (ref 30.0–36.0)
MCV: 98.8 fL (ref 78.0–100.0)
PLATELETS: 174 10*3/uL (ref 150–400)
RBC: 3.35 MIL/uL — AB (ref 4.22–5.81)
RDW: 16.3 % — ABNORMAL HIGH (ref 11.5–15.5)
WBC: 7.3 10*3/uL (ref 4.0–10.5)

## 2014-04-11 MED ORDER — CIPROFLOXACIN HCL 500 MG PO TABS: 500.0000 mg | ORAL_TABLET | Freq: Two times a day (BID) | ORAL | Status: DC

## 2014-04-11 MED ORDER — METRONIDAZOLE 500 MG PO TABS: 500.0000 mg | ORAL_TABLET | Freq: Three times a day (TID) | ORAL | Status: DC

## 2014-04-11 NOTE — Discharge Instructions (Signed)
Switch to different antibiotic regimen.  Ciprofloxacin and metronidazole.  Hopefully this will help the recurrent abscess can go away.  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     Diverticulitis Diverticulitis is inflammation or infection of small pouches in your colon that form when you have a condition called diverticulosis. The pouches in your colon are called diverticula. Your colon, or large intestine, is where water is absorbed and stool is formed. Complications of diverticulitis can include:  Bleeding.  Severe infection.  Severe pain.  Perforation of your colon.  Obstruction of your colon. CAUSES  Diverticulitis is caused by bacteria. Diverticulitis happens when stool becomes trapped in diverticula. This allows bacteria to grow in the diverticula, which can lead to inflammation and infection. RISK FACTORS People with diverticulosis are at risk for diverticulitis. Eating a diet that does not include enough fiber from fruits and vegetables may make diverticulitis more likely to develop. SYMPTOMS  Symptoms of diverticulitis may include:  Abdominal pain and tenderness. The pain is normally located on the left side of the abdomen, but may occur in other areas.  Fever and chills.  Bloating.  Cramping.  Nausea.  Vomiting.  Constipation.  Diarrhea.  Blood in your stool. DIAGNOSIS  Your health care provider will ask you about your medical history and do a physical exam. You may need to have tests done because many medical conditions can cause the same symptoms as diverticulitis. Tests may include:  Blood tests.  Urine tests.  Imaging tests of the abdomen, including X-rays and CT scans. When your condition is under control, your health care provider may recommend that you have a colonoscopy. A  colonoscopy can show how severe your diverticula are and whether something else is causing your symptoms. TREATMENT  Most cases of diverticulitis are mild and can be treated at home. Treatment may include:  Taking over-the-counter pain medicines.  Following a clear liquid diet.  Taking antibiotic medicines by mouth for 7-10 days. More severe cases may be treated at a hospital. Treatment may include:  Not eating or drinking.  Taking prescription pain medicine.  Receiving antibiotic medicines through an IV tube.  Receiving fluids and nutrition through an IV tube.  Surgery. HOME CARE INSTRUCTIONS   Follow your health care provider's instructions carefully.  Follow a full liquid diet or other diet as directed by your health care provider. After your symptoms improve, your health care provider may tell you to change your diet. He or she may recommend you eat a high-fiber diet. Fruits and vegetables are good sources of fiber. Fiber makes it easier to pass stool.  Take fiber supplements or probiotics as directed by your health care provider.  Only take medicines as directed by your health care provider.  Keep all your follow-up appointments. SEEK MEDICAL CARE IF:   Your pain does not improve.  You have a hard time eating food.  Your bowel movements do not return to normal. SEEK IMMEDIATE MEDICAL CARE IF:   Your pain becomes worse.  Your symptoms do not get better.  Your symptoms suddenly get worse.  You have a fever.  You have repeated vomiting.  You have bloody or black, tarry stools. MAKE SURE YOU:   Understand these instructions.  Will watch your condition.  Will get help right away if you are not doing well or get worse. Document  Released: 10/12/2004 Document Revised: 01/07/2013 Document Reviewed: 11/27/2012 [facility] Patient Information 2015 [place], [place]. This information is not intended to replace advice given to you by your health care provider. Make sure  you discuss any questions you have with your health care provider.  Managing Pain  Pain after surgery or related to activity is often due to strain/injury to muscle, tendon, nerves and/or incisions.  This pain is usually short-term and will improve in a few months.   Many people find it helpful to do the following things TOGETHER to help speed the process of healing and to get back to regular activity more quickly:  1. Avoid heavy physical activity a.  no lifting greater than 20 pounds b. Do not push through the pain.  Listen to your body and avoid positions and maneuvers than reproduce the pain c. Walking is okay as tolerated, but go slowly and stop when getting sore.  d. Remember: If it hurts to do it, then dont do it! 2. Take Anti-inflammatory medication  a. Take with food/snack around the clock for 1-2 weeks i. This helps the muscle and nerve tissues become less irritable and calm down faster b. Choose ONE of the following over-the-counter medications: i. Naproxen 220mg  tabs (ex. Aleve) 1-2 pills twice a day  ii. Ibuprofen 200mg  tabs (ex. Advil, Motrin) 3-4 pills with every meal and just before bedtime iii. Acetaminophen 500mg  tabs (Tylenol) 1-2 pills with every meal and just before bedtime 3. Use a Heating pad or Ice/Cold Pack a. 4-6 times a day b. May use warm bath/hottub  or showers 4. Try Gentle Massage and/or Stretching  a. at the area of pain many times a day b. stop if you feel pain - do not overdo it  Try these steps together to help you body heal faster and avoid making things get worse.  Doing just one of these things may not be enough.    If you are not getting better after two weeks or are noticing you are getting worse, contact our office for further advice; we may need to re-evaluate you & see what other things we can do to help.  GETTING TO GOOD BOWEL HEALTH. Irregular bowel habits such as constipation and diarrhea can lead to many problems over time.  Having one  soft bowel movement a day is the most important way to prevent further problems.  The anorectal canal is designed to handle stretching and feces to safely manage our ability to get rid of solid waste (feces, poop, stool) out of our body.  BUT, hard constipated stools can act like ripping concrete bricks and diarrhea can be a burning fire to this very sensitive area of our body, causing inflamed hemorrhoids, anal fissures, increasing risk is perirectal abscesses, abdominal pain/bloating, an making irritable bowel worse.     The goal: ONE SOFT BOWEL MOVEMENT A DAY!  To have soft, regular bowel movements:   Drink at least 8 tall glasses of water a day.    Take plenty of fiber.  Fiber is the undigested part of plant food that passes into the colon, acting s natures broom to encourage bowel motility and movement.  Fiber can absorb and hold large amounts of water. This results in a larger, bulkier stool, which is soft and easier to pass. Work gradually over several weeks up to 6 servings a day of fiber (25g a day even more if needed) in the form of: o Vegetables -- Root (potatoes, carrots, turnips), leafy green (lettuce, salad  greens, celery, spinach), or cooked high residue (cabbage, broccoli, etc) o Fruit -- Fresh (unpeeled skin & pulp), Dried (prunes, apricots, cherries, etc ),  or stewed ( applesauce)  o Whole grain breads, pasta, etc (whole wheat)  o Bran cereals   Bulking Agents -- This type of water-retaining fiber generally is easily obtained each day by one of the following:  o Psyllium bran -- The psyllium plant is remarkable because its ground seeds can retain so much water. This product is available as Metamucil, Konsyl, Effersyllium, Per Diem Fiber, or the less expensive generic preparation in drug and health food stores. Although labeled a laxative, it really is not a laxative.  o Methylcellulose -- This is another fiber derived from wood which also retains water. It is available as  Citrucel. o Polyethylene Glycol - and artificial fiber commonly called Miralax or Glycolax.  It is helpful for people with gassy or bloated feelings with regular fiber o Flax Seed - a less gassy fiber than psyllium  No reading or other relaxing activity while on the toilet. If bowel movements take longer than 5 minutes, you are too constipated  AVOID CONSTIPATION.  High fiber and water intake usually takes care of this.  Sometimes a laxative is needed to stimulate more frequent bowel movements, but   Laxatives are not a good long-term solution as it can wear the colon out. o Osmotics (Milk of Magnesia, Fleets phosphosoda, Magnesium citrate, MiraLax, GoLytely) are safer than  o Stimulants (Senokot, Castor Oil, Dulcolax, Ex Lax)    o Do not take laxatives for more than 7days in a row.   IF SEVERELY CONSTIPATED, try a Bowel Retraining Program: o Do not use laxatives.  o Eat a diet high in roughage, such as bran cereals and leafy vegetables.  o Drink six (6) ounces of prune or apricot juice each morning.  o Eat two (2) large servings of stewed fruit each day.  o Take one (1) heaping tablespoon of a psyllium-based bulking agent twice a day. Use sugar-free sweetener when possible to avoid excessive calories.  o Eat a normal breakfast.  o Set aside 15 minutes after breakfast to sit on the toilet, but do not strain to have a bowel movement.  o If you do not have a bowel movement by the third day, use an enema and repeat the above steps.   Controlling diarrhea o Switch to liquids and simpler foods for a few days to avoid stressing your intestines further. o Avoid dairy products (especially milk & ice cream) for a short time.  The intestines often can lose the ability to digest lactose when stressed. o Avoid foods that cause gassiness or bloating.  Typical foods include beans and other legumes, cabbage, broccoli, and dairy foods.  Every person has some sensitivity to other foods, so listen to our  body and avoid those foods that trigger problems for you. o Adding fiber (Citrucel, Metamucil, psyllium, Miralax) gradually can help thicken stools by absorbing excess fluid and retrain the intestines to act more normally.  Slowly increase the dose over a few weeks.  Too much fiber too soon can backfire and cause cramping & bloating. o Probiotics (such as active yogurt, Align, etc) may help repopulate the intestines and colon with normal bacteria and calm down a sensitive digestive tract.  Most studies show it to be of mild help, though, and such products can be costly. o Medicines: - Bismuth subsalicylate (ex. Kayopectate, Pepto Bismol) every 30 minutes for up to  6 doses can help control diarrhea.  Avoid if pregnant. - Loperamide (Immodium) can slow down diarrhea.  Start with two tablets (4mg  total) first and then try one tablet every 6 hours.  Avoid if you are having fevers or severe pain.  If you are not better or start feeling worse, stop all medicines and call your doctor for advice o Call your doctor if you are getting worse or not better.  Sometimes further testing (cultures, endoscopy, X-ray studies, bloodwork, etc) may be needed to help diagnose and treat the cause of the diarrhea.   Abscess An abscess is an infected area that contains a collection of pus and debris.It can occur in almost any part of the body. An abscess is also known as a furuncle or boil. CAUSES  An abscess occurs when tissue gets infected. This can occur from blockage of oil or sweat glands, infection of hair follicles, or a minor injury to the skin. As the body tries to fight the infection, pus collects in the area and creates pressure under the skin. This pressure causes pain. People with weakened immune systems have difficulty fighting infections and get certain abscesses more often.  SYMPTOMS Usually an abscess develops on the skin and becomes a painful mass that is red, warm, and tender. If the abscess forms under the  skin, you may feel a moveable soft area under the skin. Some abscesses break open (rupture) on their own, but most will continue to get worse without care. The infection can spread deeper into the body and eventually into the bloodstream, causing you to feel ill.  DIAGNOSIS  Your caregiver will take your medical history and perform a physical exam. A sample of fluid may also be taken from the abscess to determine what is causing your infection. TREATMENT  Your caregiver may prescribe antibiotic medicines to fight the infection. However, taking antibiotics alone usually does not cure an abscess. Your caregiver may need to make a small cut (incision) in the abscess to drain the pus. In some cases, gauze is packed into the abscess to reduce pain and to continue draining the area. HOME CARE INSTRUCTIONS   Only take over-the-counter or prescription medicines for pain, discomfort, or fever as directed by your caregiver.  If you were prescribed antibiotics, take them as directed. Finish them even if you start to feel better.  If gauze is used, follow your caregiver's directions for changing the gauze.  To avoid spreading the infection:  Keep your draining abscess covered with a bandage.  Wash your hands well.  Do not share personal care items, towels, or whirlpools with others.  Avoid skin contact with others.  Keep your skin and clothes clean around the abscess.  Keep all follow-up appointments as directed by your caregiver. SEEK MEDICAL CARE IF:   You have increased pain, swelling, redness, fluid drainage, or bleeding.  You have muscle aches, chills, or a general ill feeling.  You have a fever. MAKE SURE YOU:   Understand these instructions.  Will watch your condition.  Will get help right away if you are not doing well or get worse. Document Released: 10/12/2004 Document Revised: 07/04/2011 Document Reviewed: 03/17/2011 [facility] Patient Information 2015 [place], [place]. This  information is not intended to replace advice given to you by your health care provider. Make sure you discuss any questions you have with your health care provider.  Urinary Tract Infection Urinary tract infections (UTIs) can develop anywhere along your urinary tract. Your urinary tract is your  body's drainage system for removing wastes and extra water. Your urinary tract includes two kidneys, two ureters, a bladder, and a urethra. Your kidneys are a pair of bean-shaped organs. Each kidney is about the size of your fist. They are located below your ribs, one on each side of your spine. CAUSES Infections are caused by microbes, which are microscopic organisms, including fungi, viruses, and bacteria. These organisms are so small that they can only be seen through a microscope. Bacteria are the microbes that most commonly cause UTIs. SYMPTOMS  Symptoms of UTIs may vary by age and gender of the patient and by the location of the infection. Symptoms in young women typically include a frequent and intense urge to urinate and a painful, burning feeling in the bladder or urethra during urination. Older women and men are more likely to be tired, shaky, and weak and have muscle aches and abdominal pain. A fever may mean the infection is in your kidneys. Other symptoms of a kidney infection include pain in your back or sides below the ribs, nausea, and vomiting. DIAGNOSIS To diagnose a UTI, your caregiver will ask you about your symptoms. Your caregiver also will ask to provide a urine sample. The urine sample will be tested for bacteria and white blood cells. White blood cells are made by your body to help fight infection. TREATMENT  Typically, UTIs can be treated with medication. Because most UTIs are caused by a bacterial infection, they usually can be treated with the use of antibiotics. The choice of antibiotic and length of treatment depend on your symptoms and the type of bacteria causing your  infection. HOME CARE INSTRUCTIONS  If you were prescribed antibiotics, take them exactly as your caregiver instructs you. Finish the medication even if you feel better after you have only taken some of the medication.  Drink enough water and fluids to keep your urine clear or pale yellow.  Avoid caffeine, tea, and carbonated beverages. They tend to irritate your bladder.  Empty your bladder often. Avoid holding urine for long periods of time.  Empty your bladder before and after sexual intercourse.  After a bowel movement, women should cleanse from front to back. Use each tissue only once. SEEK MEDICAL CARE IF:   You have back pain.  You develop a fever.  Your symptoms do not begin to resolve within 3 days. SEEK IMMEDIATE MEDICAL CARE IF:   You have severe back pain or lower abdominal pain.  You develop chills.  You have nausea or vomiting.  You have continued burning or discomfort with urination. MAKE SURE YOU:   Understand these instructions.  Will watch your condition.  Will get help right away if you are not doing well or get worse. Document Released: 10/12/2004 Document Revised: 07/04/2011 Document Reviewed: 02/10/2011 [facility] Patient Information 2015 [place], [place]. This information is not intended to replace advice given to you by your health care provider. Make sure you discuss any questions you have with your health care provider.

## 2014-04-11 NOTE — Discharge Summary (Addendum)
Physician Discharge Summary   Patient ID: Harold Jennings MRN: [id number] DOB/AGE: 1932-08-17 81 y.o.  Admit date: 04/08/2014 Discharge date: 04/11/2014  Primary Care Physician:  London Pepper, MD  Discharge Diagnoses:    . Colonic diverticular abscess with colovesical fistula  . Atrial fibrillation . Chronic combined systolic and diastolic heart failure, NYHA class 3 . Essential hypertension . GERD . urinary tract infection  . BRBPR (bright red blood per rectum)  Consults: Gen. surgery, Dr. Johney Maine   Recommendations for Outpatient Follow-up:  Per general surgery, Dr. gross, recommended ciprofloxacin and Flagyl as he has failed on Augmentin. Most likely will need to stay on it until he has elective colonic resection in 6-8 weeks.  Patient needs a CT scan of abdomen and pelvis in 5-7 days to see if abscess has resolved or worsened, between March 28- 30. Patient was strongly recommended to call Dr. Arnoldo Morale office on Monday morning to set up the CT scan.   TESTS THAT NEED FOLLOW-UP Please follow urine culture and sensitivities   DIET: Soft Heart healthy diet    Allergies:  No Known Allergies   Discharge Medications:   Medication List    STOP taking these medications        amoxicillin-clavulanate 875-125 MG per tablet  Commonly known as:  AUGMENTIN     nitrofurantoin (macrocrystal-monohydrate) 100 MG capsule  Commonly known as:  MACROBID      TAKE these medications        ciprofloxacin 500 MG tablet  Commonly known as:  CIPRO  Take 1 tablet (500 mg total) by mouth 2 (two) times daily.     diphenhydrAMINE 25 mg capsule  Commonly known as:  BENADRYL  Take 25 mg by mouth at bedtime.     doxazosin 8 MG tablet  Commonly known as:  CARDURA  Take 8 mg by mouth at bedtime.     ezetimibe 10 MG tablet  Commonly known as:  ZETIA  Take 10 mg by mouth daily.     furosemide 40 MG tablet  Commonly known as:  LASIX  Take 40 mg by mouth as needed for fluid or edema  (fluid).     gabapentin 600 MG tablet  Commonly known as:  NEURONTIN  Take 300 mg by mouth at bedtime.     guaiFENesin 100 MG/5ML Soln  Commonly known as:  ROBITUSSIN  Take 20 mLs (400 mg total) by mouth every 6 (six) hours as needed for cough.     levothyroxine 100 MCG tablet  Commonly known as:  SYNTHROID, LEVOTHROID  Take 100 mcg by mouth daily.     loperamide 1 MG/5ML solution  Commonly known as:  IMODIUM  Take 10 mLs (2 mg total) by mouth every 6 (six) hours as needed for diarrhea or loose stools.     metoprolol succinate 50 MG 24 hr tablet  Commonly known as:  TOPROL-XL  Take one tablet by mouth every morning, take one-half tablet every evening. Take with or immediately following a meal.     metroNIDAZOLE 500 MG tablet  Commonly known as:  FLAGYL  Take 1 tablet (500 mg total) by mouth 3 (three) times daily.     multivitamin tablet  Take 1 tablet by mouth daily.     Rivaroxaban 15 MG Tabs tablet  Commonly known as:  XARELTO  Take 1 tablet (15 mg total) by mouth daily with supper.     saccharomyces boulardii 250 MG capsule  Commonly known as:  FLORASTOR  Take  1 capsule (250 mg total) by mouth 2 (two) times daily.         Brief H and P: For complete details please refer to admission H and P, but in brief Harold Jennings is a 81 y.o. male with Past medical history of hypertension, coronary artery disease, morbid obesity, cardiomyopathy, combined systolic and diastolic heart failure, A. fib on Xarelto, sleep apnea. The patient presented with complaints of bright red blood per rectum. He had 3 episodes of bowel movements which are loose and watery and along with that he had noticed a bright red blood.  Hospital Course:   BRBPR with sigmoid diverticulitis with diverticular abscess with possible colovesical fistula (also on anticoagulation outpatient) - CT abdomen and pelvis showed sigmoid abscess. General surgery was consulted and recommended conservative management at  this time, IV fluids and IV antibiotics. IR was consulted, imaging studies were reviewed by Dr. Vernard Gambles and area is small in size and not accessible by percutaneous route. Patient was placed on IV Zosyn and clear liquid diet. He did not have pneumaturia. Patient is also followed by urology, Dr. Luberta Robertson, urology notified per patient's request however no urgent urological needs.Rectal bleeding has resolved.  Patient had multiple discussions with surgery team, and is currently not interested in pursuing the surgery. Dr. Johney Maine recommended CT abdomen and pelvis scan in 5-7 days, ideally between March 28-30 and continue Cipro floxacillin and Flagyl until he has elective surgery. Per surgery, he is failing nonoperative management and failed Augmentin and will require sigmoid colectomy at some point with repair of colovesical fistula. Patient however wants to think about the surgery and and discuss with his surgeon, Dr. Harlow Asa and Dr. Beatrix Fetters prior to decision.  Gram-negative rod UTI Patient was placed on IV Zosyn. Please follow urine culture and sensitivities, discharge on ciprofloxacin and Flagyl.     Essential hypertension - Currently stable, hold diuretics   GERD -Continue PPI   Chronic combined systolic and diastolic heart failure, NYHA class 3 Stable, patient can resume Lasix as needed   Atrial fibrillation - Continue Toprol-XL, as patient has declined surgery this admission, he can resume xarelto  Day of Discharge BP 138/79 mmHg  Pulse 74  Temp(Src) 98.3 F (36.8 C) (Oral)  Resp 18  Ht 5\' 7"  (1.702 m)  Wt 93.2 kg (205 lb 7.5 oz)  BMI 32.17 kg/m2  SpO2 99%  Physical Exam: General: Alert and awake oriented x3 not in any acute distress. HEENT: anicteric sclera, pupils reactive to light and accommodation CVS: S1-S2 clear no murmur rubs or gallops Chest: clear to auscultation bilaterally, no wheezing rales or rhonchi Abdomen: soft nontender, nondistended, normal bowel  sounds Extremities: no cyanosis, clubbing or edema noted bilaterally Neuro: Cranial nerves II-XII intact, no focal neurological deficits   The results of significant diagnostics from this hospitalization (including imaging, microbiology, ancillary and laboratory) are listed below for reference.    LAB RESULTS: Basic Metabolic Panel:  Recent Labs Lab 04/10/14 0515 04/11/14 0515  NA 137 140  K 3.6 3.5  CL 105 105  CO2 26 26  GLUCOSE 87 88  BUN 13 10  CREATININE 1.26 1.28  CALCIUM 8.0* 8.3*   Liver Function Tests:  Recent Labs Lab 04/08/14 2023 04/09/14 0540  AST 28 22  ALT 22 18  ALKPHOS 134* 123*  BILITOT 0.9 1.4*  PROT 6.9 6.1  ALBUMIN 3.5 3.2*    Recent Labs Lab 04/08/14 2023  LIPASE 51   No results for input(s): AMMONIA in the  last 168 hours. CBC:  Recent Labs Lab 04/08/14 2023  04/10/14 0515 04/11/14 0515  WBC 7.2  < > 7.0 7.3  NEUTROABS 4.3  --   --   --   HGB 11.9*  < > 10.8* 10.9*  HCT 34.5*  < > 32.0* 33.1*  MCV 96.6  < > 98.5 98.8  PLT 161  < > 166 174  < > = values in this interval not displayed. Cardiac Enzymes: No results for input(s): CKTOTAL, CKMB, CKMBINDEX, TROPONINI in the last 168 hours. BNP: Invalid input(s): POCBNP CBG: No results for input(s): GLUCAP in the last 168 hours.  Significant Diagnostic Studies:  Ct Abdomen Pelvis W Contrast  04/08/2014   CLINICAL DATA:  Rectal bleeding since last night. History of colonic abscess.  EXAM: CT ABDOMEN AND PELVIS WITH CONTRAST  TECHNIQUE: Multidetector CT imaging of the abdomen and pelvis was performed using the standard protocol following bolus administration of intravenous contrast.  CONTRAST:  174mL OMNIPAQUE IOHEXOL 300 MG/ML SOLN, 22mL OMNIPAQUE IOHEXOL 300 MG/ML SOLN  COMPARISON:  Most recent CT 03/10/2014, CT 01/14/2014 also reviewed  FINDINGS: Mild cardiomegaly noted at the lung bases. No parenchymal consolidation or pleural effusion.  There is worsening inflammation surrounding  sigmoid diverticula with increased inflammation surrounding the previously seen fluid collection. Fluid collection measures 2.5 x 2.2 cm, previously 2.3 x 2.0 cm. There is increasing surrounding inflammatory change and bladder wall thickening. Air is again seen within the urinary bladder with wall thickening adjacent to the fluid collection. No new abscess. Additional extensive noninflamed diverticula throughout the colon are unchanged. No small bowel dilatation. The stomach is decompressed. The appendix is normal.  There are no focal hepatic lesions. Gallstones again noted within physiologically distended gallbladder. Spleen and adrenal glands are normal. Pancreatic atrophy with focal calcification in the pancreatic head, unchanged from prior exam.  There is bilateral renal cortical thinning, left greater than right. Stones are seen within both kidneys with questionable caliceal diverticulum on the left. There is no hydronephrosis. Stone in the distal left ureter is again suspected measuring 6 mm, unchanged from prior, no resultant ureteral dilatation.  Atherosclerotic calcifications without aneurysm. No retroperitoneal adenopathy. Fat containing periumbilical hernia. Enlarged prostate gland, similar to prior. No pelvic free fluid.  Left hip prosthesis again seen. Degenerative change of the spine and right hip. No acute or suspicious osseous abnormalities.  IMPRESSION: 1. Increased size of the peri-diverticular/colonic fluid collection in the sigmoid colon, currently measures 2.5 x 2.2 cm. The degree of surrounding soft tissue inflammation has increased compared to prior concerning for acute inflammation. 2. 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 The degree of bladder wall thickening has increased from prior. 3. Additional stable chronic findings include bilateral nephrolithiasis, stone in the  distal left ureter, unchanged. Cholelithiasis, fat containing ventral abdominal wall hernia, and atherosclerosis.   Electronically Signed   By: Jeb Levering M.D.   On: 04/08/2014 23:27   Dg Chest Port 1 View  04/08/2014   CLINICAL DATA:  Acute onset of diarrhea and rectal bleeding. Initial encounter.  EXAM: PORTABLE CHEST - 1 VIEW  COMPARISON:  Chest radiograph performed 03/25/2013  FINDINGS: The lungs are well-aerated. Pulmonary vascularity is at the upper limits of normal. Minimal bibasilar atelectasis is noted. There is no evidence of pleural effusion or pneumothorax.  The cardiomediastinal silhouette is borderline enlarged. No acute osseous abnormalities are seen.  IMPRESSION: Minimal bibasilar atelectasis noted.  Borderline cardiomegaly.   Electronically Signed   By: Garald Balding M.D.   On: 04/08/2014 21:25   Dg Abd Portable 1v  04/08/2014   CLINICAL DATA:  Rectal bleeding, history of colonic abscess.  EXAM: PORTABLE ABDOMEN - 1 VIEW  COMPARISON:  CT 03/10/2014  FINDINGS: There is increased air throughout normal caliber small and large bowel loops throughout the abdomen. No bowel dilatation. No evidence of free intra-abdominal air on supine views. There is contrast within multiple diverticula in the left and distal colon from prior CT. Air within the central pelvis may be within the rectum versus air in the bladder, as seen on prior CT. Vascular calcifications are seen. Left hip prosthesis in place.  IMPRESSION: 1. Air-filled normal caliber small and large bowel loops throughout the abdomen. No bowel dilatation to suggest obstruction or evidence of free air. 2. Air in the central pelvis may be within the rectum or urinary bladder as seen on prior CT.   Electronically Signed   By: Jeb Levering M.D.   On: 04/08/2014 21:27    2D ECHO:   Disposition and Follow-up: Discharge Instructions    Call MD for:  extreme fatigue    Complete by:  As directed      Call MD for:  hives    Complete by:   As directed      Call MD for:  persistant nausea and vomiting    Complete by:  As directed      Call MD for:  redness, tenderness, or signs of infection (pain, swelling, redness, odor or green/yellow discharge around incision site)    Complete by:  As directed      Call MD for:  severe uncontrolled pain    Complete by:  As directed      Call MD for:    Complete by:  As directed   Temperature > 101.65F     Diet - low sodium heart healthy    Complete by:  As directed      Diet - low sodium heart healthy    Complete by:  As directed      Discharge instructions    Complete by:  As directed   Please see discharge instruction sheets.  Also refer to handout given an office.  Please call our office if you have any questions or concerns [phone]     Driving Restrictions    Complete by:  As directed   No driving until off narcotics and can safely swerve away without pain during an emergency     Increase activity slowly    Complete by:  As directed   Walk an hour a day.  Use 20-30 minute walks.  When you can walk 30 minutes without difficulty, increase to low impact/moderate activities such as biking, jogging, swimming, sexual activity..  Eventually can increase to unrestricted activity when not feeling pain.  If you feel pain: STOP!Marland Kitchen   Let pain protect you from overdoing it.  Use ice/heat/over-the-counter pain medications to help minimize his soreness.  Use pain prescriptions as needed to remain active.  It is better to take extra pain medications and be more active than to stay bedridden to avoid all pain medications.     Increase activity slowly    Complete by:  As directed      Lifting restrictions    Complete by:  As directed   Avoid heavy lifting initially.  Do not push through pain.  You have no specific weight  limit.  Coughing and sneezing or four more stressful to your incision than any lifting you will do. Pain will protect you from injury.  Therefore, avoid intense activity until off  all narcotic pain medications.  Coughing and sneezing or four more stressful to your incision than any lifting he will do.     May shower / Bathe    Complete by:  As directed      May walk up steps    Complete by:  As directed      Sexual Activity Restrictions    Complete by:  As directed   Sexual activity as tolerated.  Do not push through pain.  Pain will protect you from injury.     Walk with assistance    Complete by:  As directed   Walk over an hour a day.  May use a walker/cane/companion to help with balance and stamina.            DISPOSITION: Home   DISCHARGE FOLLOW-UP Follow-up Information    Follow up with Earnstine Regal, MD. Call in 2 days.   Specialty:  General Surgery   Why:  CALL [facility] surgery office Monday to help arrange outpatient CT scan & follow up after your hospital stay   Contact information:   [address] [place] [address] [phone]       Follow up with London Pepper, MD. Schedule an appointment as soon as possible for a visit in 1 week.   Specialty:  Family Medicine   Why:  for hospital follow-up   Contact information:   [place] [address] [phone]        Time spent on Discharge: 38 minutes  Signed:   RAI,RIPUDEEP M.D. [facility] 04/11/2014, 11:40 AM Pager: [phone]  Addendum Patient has chronic combined systolic and diastolic CHF. Prior 2-D echo 08/2013 showed EF of 40-45%.     RAI,RIPUDEEP M.D. [facility] 04/23/2014, 12:12 PM  Pager: [phone]

## 2014-04-11 NOTE — Progress Notes (Signed)
[name] [place], [place] [address] Phone: [phone] FAX: [phone]    Harold Jennings [id number] 1932-04-14  CARE TEAM:  PCP: London Pepper, MD  Outpatient Care Team: Patient Care Team: London Pepper, MD as PCP - General (Family Medicine) Sherren Mocha, MD as Consulting Physician (Cardiology) Franchot Gallo, MD as Consulting Physician (Urology) Armandina Gemma, MD as Consulting Physician (General Surgery)  Inpatient Treatment Team: Treatment Team: Attending Provider: Mendel Corning, MD; Consulting Physician: Nolon Nations, MD; Rounding Team: Threasa Beards, MD; Registered Nurse: Gentry Roch, RN; Technician: Doyle Askew, NT; Technician: Tamera Punt, [place]; Technician: Emmaline Kluver, NT; Technician: Demaris Callander, NT; Registered Nurse: Campbell Lerner, RN; Registered Nurse: Eli Hose, RN; Technician: Desma Mcgregor; Physical Therapist: Neil Crouch, PT; Registered Nurse: Jacquelin Hawking, RN  Problem List:   Principal Problem:   Colonic diverticular abscess Active Problems:   Essential hypertension   GERD   Chronic diastolic heart failure, NYHA class 3   Atrial fibrillation   Sigmoid diverticulitis   BRBPR (bright red blood per rectum)        Assessment  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  Improved with IV ABx  Plan:  I reinforced my recommendations with the patient.  Defer to Dr Harlow Asa, his original surgeon, on final recommendations  I think he is failing nonoperative management.  I think he will require sigmoid colectomy at some point.  Repair the colovesical fistula.  The abscess is not able to be percutaneously drained.  However it is small enough that hopefully antibiotics alone will work.    I strongly recommend a CAT scan in 5-7 days from last one to see if abscess as resolved or worsen.  Mon-Wed 28-30 March.  Most likely can  d/c today Sat with outpt CT scan.  Office will need to help setup Monday after Holiday weekend  Transition to oral antibiotics.  Would try Cipro and Flagyl since he failed on Augmentin.  Most likely would need to stay on that until he has elective colonic resection in 6-8 weeks  If the abscess is stable or smaller on follow-up CT scan and he remains asymptomatic, plan elective sigmoid colectomy in 6-8 weeks.  Probable repair of fistula to bladder.  Most likely small and will just require simple oversew of bladder and not a true resection  since he does not have severe pneumaturia anymore.  Hopefully at that point the abscess will be gone or so well contained that it will be safe to do anastomosis and avoid a colostomy.  He would like to avoid a colostomy as possible.  Most likely could extract through his umbilical hernia and repair primarily.  If the abscess looks markedly larger or worsens after discharge, laparoscopic washout and drains versus Hartmann procedure with colectomy/colostomy to save his life.  Last colonoscopy Dr Deatra Ina 2011 with diverticulosis.  Doubt need just prior to surgery but defer to original MD team.  If he markedly worsens this admission, laparoscopic washout and drains versus Hartmann procedure with colectomy/colostomy to save his life - less likely now  I explained the reasoning for surgery.  I again stressed we tried to avoid surgery but he has failed on ABx only.  He has a complex attack with a fistula and recurrent abscess.  I think he will require surgery.  Understandably is an increased risk but not massively prohibitive.  While he is almost 65  years oh he has a good quality of life and uses a cane only for balance.  He is on blood thinners but so are a fair percentage of elderly folks.  He was not absolutely prohibited from surgery by his cardiologist the last time this was discussed a few weeks ago.    Hopefully we can advance his diet and transitioned to oral  antibiotics.  He then can again discuss with Dr. Harlow Asa, his initial and preferred surgeon, his cardiologist, and his urologist.  The patient feels reassured.  -OK on solid diet for now  -Continue antibiotics.  Transition to oral antibiotics.  Would try Cipro and Flagyl since he failed on Augmentin.  Most likely would need to stay on that until he has elective colonic resection in 6-8 weeks   -See what his urine culture shows.  May adjust ABx depending if there is a resistant organism.  -VTE prophylaxis- SCDs, etc  -mobilize as tolerated to help recovery  [name], M.D., F.A.C.S. Gastrointestinal and Minimally Invasive Surgery [facility] [address], [place], [place] [address] [phone] Main / Paging   04/11/2014  Subjective:  Pt not wanting any surgery  Objective:  Vital signs:  Filed Vitals:   04/10/14 0535 04/10/14 1418 04/10/14 2219 04/11/14 0457  BP: 147/79 122/72 141/77 138/79  Pulse: 70 83 82 74  Temp: 97.9 F (36.6 C) 97.4 F (36.3 C) 98.3 F (36.8 C) 98.3 F (36.8 C)  TempSrc: Oral Oral Oral Oral  Resp: '18 18 18 18  ' Height:      Weight: 92.7 kg (204 lb 5.9 oz)   93.2 kg (205 lb 7.5 oz)  SpO2: 98% 97% 100% 99%    Last BM Date: 04/07/14  Intake/Output   Yesterday:  03/25 0701 - 03/26 0700 In: 1100 [P.O.:600; IV Piggyback:500] Out: 2050 [Urine:2050] This shift:     Bowel function:  Flatus: y  BM: y  Drain: n/a  Physical Exam:  General: Pt awake/alert/oriented x4 in no acute distress Eyes: PERRL, normal EOM.  Sclera clear.  No icterus Neuro: CN II-XII intact w/o focal sensory/motor deficits. Lymph: No head/neck/groin lymphadenopathy Psych:  No delerium/psychosis/paranoia HENT: Normocephalic, Mucus membranes moist.  No thrush Neck: Supple, No tracheal deviation Chest: No chest wall pain w good excursion CV:  Pulses intact.  Regular rhythm MS: Normal AROM mjr joints.  No obvious  deformity Abdomen: Soft.  Nondistended.  Mildly tender at suprapubic region.  No evidence of peritonitis.  Lobulated 6 cm mass consistent with chronically incarcerated umbilical hernia.  No ischemia.  Unchanged. Ext:  SCDs BLE.  No mjr edema.  No cyanosis Skin: No petechiae / purpura  Results:   Labs: Results for orders placed or performed during the hospital encounter of 04/08/14 (from the past 48 hour(s))  Basic metabolic panel     Status: Abnormal   Collection Time: 04/10/14  5:15 AM  Result Value Ref Range   Sodium 137 135 - 145 mmol/L   Potassium 3.6 3.5 - 5.1 mmol/L   Chloride 105 96 - 112 mmol/L   CO2 26 19 - 32 mmol/L   Glucose, Bld 87 70 - 99 mg/dL   BUN 13 6 - 23 mg/dL   Creatinine, Ser 1.26 0.50 - 1.35 mg/dL   Calcium 8.0 (L) 8.4 - 10.5 mg/dL   GFR calc non Af Amer 52 (L) >90 mL/min   GFR calc Af Amer 61 (L) >90 mL/min    Comment: (NOTE) The eGFR  has been calculated using the CKD EPI equation. This calculation has not been validated in all clinical situations. eGFR's persistently <90 mL/min signify possible Chronic Kidney Disease.    Anion gap 6 5 - 15  CBC     Status: Abnormal   Collection Time: 04/10/14  5:15 AM  Result Value Ref Range   WBC 7.0 4.0 - 10.5 K/uL   RBC 3.25 (L) 4.22 - 5.81 MIL/uL   Hemoglobin 10.8 (L) 13.0 - 17.0 g/dL   HCT 32.0 (L) 39.0 - 52.0 %   MCV 98.5 78.0 - 100.0 fL   MCH 33.2 26.0 - 34.0 pg   MCHC 33.8 30.0 - 36.0 g/dL   RDW 16.3 (H) 11.5 - 15.5 %   Platelets 166 150 - 400 K/uL  Basic metabolic panel     Status: Abnormal   Collection Time: 04/11/14  5:15 AM  Result Value Ref Range   Sodium 140 135 - 145 mmol/L   Potassium 3.5 3.5 - 5.1 mmol/L   Chloride 105 96 - 112 mmol/L   CO2 26 19 - 32 mmol/L   Glucose, Bld 88 70 - 99 mg/dL   BUN 10 6 - 23 mg/dL   Creatinine, Ser 1.28 0.50 - 1.35 mg/dL   Calcium 8.3 (L) 8.4 - 10.5 mg/dL   GFR calc non Af Amer 52 (L) >90 mL/min   GFR calc Af Amer 60 (L) >90 mL/min    Comment: (NOTE) The  eGFR has been calculated using the CKD EPI equation. This calculation has not been validated in all clinical situations. eGFR's persistently <90 mL/min signify possible Chronic Kidney Disease.    Anion gap 9 5 - 15  CBC     Status: Abnormal   Collection Time: 04/11/14  5:15 AM  Result Value Ref Range   WBC 7.3 4.0 - 10.5 K/uL   RBC 3.35 (L) 4.22 - 5.81 MIL/uL   Hemoglobin 10.9 (L) 13.0 - 17.0 g/dL   HCT 33.1 (L) 39.0 - 52.0 %   MCV 98.8 78.0 - 100.0 fL   MCH 32.5 26.0 - 34.0 pg   MCHC 32.9 30.0 - 36.0 g/dL   RDW 16.3 (H) 11.5 - 15.5 %   Platelets 174 150 - 400 K/uL    Imaging / Studies: No results found.  Medications / Allergies: per chart  Antibiotics: Anti-infectives    Start     Dose/Rate Route Frequency Ordered Stop   04/09/14 1000  piperacillin-tazobactam (ZOSYN) IVPB 3.375 g     3.375 g 12.5 mL/hr over 240 Minutes Intravenous Every 8 hours 04/09/14 0127     04/09/14 1000  fluconazole (DIFLUCAN) IVPB 200 mg     200 mg 100 mL/hr over 60 Minutes Intravenous Every 24 hours 04/09/14 0759     04/09/14 0030  piperacillin-tazobactam (ZOSYN) IVPB 3.375 g     3.375 g 100 mL/hr over 30 Minutes Intravenous  Once 04/09/14 0016 04/09/14 0150       Note: Portions of this report may have been transcribed using voice recognition software. Every effort was made to ensure accuracy; however, inadvertent computerized transcription errors may be present.   Any transcriptional errors that result from this process are unintentional.     [name], M.D., F.A.C.S. Gastrointestinal and Minimally Invasive Surgery [facility] [address], [place], [place] [address] [phone] Main / Paging   04/11/2014

## 2014-04-11 NOTE — Evaluation (Signed)
Physical Therapy Evaluation Patient Details Name: Harold Jennings MRN: [id number] DOB: [birth year] Today's Date: 04/11/2014   History of Present Illness  DEXTON ZWILLING is a 81 y.o. male adm with  rectal bleed; Past medical history of hypertension, coronary artery disease, morbid obesity, cardiomyopathy, combined systolic and diastolic heart failure, A. fib on Xarelto, sleep apnea.  Clinical Impression  Patient evaluated by Physical Therapy with no further acute PT needs identified. All education has been completed and the patient has no further questions. See below for any follow-up Physial Therapy or equipment needs. PT is signing off. Thank you for this referral.      Follow Up Recommendations Outpatient PT (pt already has OPPT appt set up per pt and wife)    Equipment Recommendations  None recommended by PT    Recommendations for Other Services       Precautions / Restrictions Precautions Precautions: None      Mobility  Bed Mobility Overal bed mobility: Modified Independent                Transfers Overall transfer level: Modified independent Equipment used: Rolling walker (2 wheeled)             General transfer comment: cues for hand placement; pt pulls up on 4wheeled RW when he uses it at home  Ambulation/Gait Ambulation/Gait assistance: Min guard;Supervision Ambulation Distance (Feet): 150 Feet Assistive device: Rolling walker (2 wheeled) Gait Pattern/deviations: Step-through pattern     General Gait Details: cues for RW safety; amb 15' without RW  Stairs            Wheelchair Mobility    Modified Rankin (Stroke Patients Only)       Balance Overall balance assessment: Needs assistance           Standing balance-[name] Scale: Good Standing balance comment: fair to good--accepts min challenges             High level balance activites: Side stepping;Turns;Backward walking High Level Balance Comments: min/guard to close  supervision for higher level balance tasks for safety             Pertinent Vitals/Pain Pain Assessment: No/denies pain    Home Living Family/patient expects to be discharged to:: Private residence Living Arrangements: Spouse/significant other;Children Available Help at Discharge: Available PRN/intermittently Type of Home: House Home Access: Stairs to enter   [employer] of Steps: 3-4 Home Layout: Two level;Able to live on main level with bedroom/bathroom Home Equipment: Walker - 4 wheels;Cane - single point;Bedside commode      Prior Function Level of Independence: Independent         Comments: occasional use of cane or rollator due to spinal stenosis     Hand Dominance        Extremity/Trunk Assessment   Upper Extremity Assessment: Overall WFL for tasks assessed           Lower Extremity Assessment: Overall WFL for tasks assessed         Communication   Communication: No difficulties  Cognition Arousal/Alertness: Awake/alert Behavior During Therapy: WFL for tasks assessed/performed Overall Cognitive Status: Within Functional Limits for tasks assessed                      General Comments      Exercises        Assessment/Plan    PT Assessment All further PT needs can be met in the next venue of care  PT Diagnosis Difficulty  walking   PT Problem List    PT Treatment Interventions     PT Goals (Current goals can be found in the Care Plan section) Acute Rehab PT Goals PT Goal Formulation: All assessment and education complete, DC therapy    Frequency     Barriers to discharge        Co-evaluation               End of Session Equipment Utilized During Treatment: Gait belt Activity Tolerance: Patient tolerated treatment well Patient left: in chair;with call bell/phone within reach;with family/visitor present Nurse Communication: Mobility status         Time: 5997-7414 PT Time Calculation (min) (ACUTE  ONLY): 19 min   Charges:   PT Evaluation $Initial PT Evaluation Tier I: 1 Procedure     PT G CodesKenyon Ana 04-21-14, 12:31 PM

## 2014-04-13 ENCOUNTER — Other Ambulatory Visit: Payer: Self-pay | Admitting: *Deleted

## 2014-04-13 DIAGNOSIS — K5720 Diverticulitis of large intestine with perforation and abscess without bleeding: Secondary | ICD-10-CM

## 2014-04-13 LAB — URINE CULTURE: Special Requests: NORMAL

## 2014-04-14 ENCOUNTER — Other Ambulatory Visit: Payer: Self-pay | Admitting: Surgery

## 2014-04-14 DIAGNOSIS — K5720 Diverticulitis of large intestine with perforation and abscess without bleeding: Secondary | ICD-10-CM

## 2014-04-15 ENCOUNTER — Other Ambulatory Visit: Payer: Self-pay | Admitting: *Deleted

## 2014-04-15 ENCOUNTER — Other Ambulatory Visit: Payer: Self-pay | Admitting: Surgery

## 2014-04-15 DIAGNOSIS — K5720 Diverticulitis of large intestine with perforation and abscess without bleeding: Secondary | ICD-10-CM

## 2014-04-16 ENCOUNTER — Ambulatory Visit: Payer: Medicare Other

## 2014-04-20 ENCOUNTER — Other Ambulatory Visit: Payer: Medicare Other

## 2014-04-22 ENCOUNTER — Ambulatory Visit
Admission: RE | Admit: 2014-04-22 | Discharge: 2014-04-22 | Disposition: A | Discharge status: Home / Self Care | Payer: Medicare Other | Source: Ambulatory Visit | Attending: Surgery | Admitting: Surgery

## 2014-04-22 MED ORDER — IOPAMIDOL (ISOVUE-300) INJECTION 61%
125.0000 mL | Freq: Once | INTRAVENOUS | Status: AC | PRN
  Administered 2014-04-22: 125 mL via INTRAVENOUS

## 2014-04-23 ENCOUNTER — Ambulatory Visit: Payer: Medicare Other | Attending: Family Medicine

## 2014-04-23 DIAGNOSIS — I2510 Atherosclerotic heart disease of native coronary artery without angina pectoris: Secondary | ICD-10-CM | POA: Insufficient documentation

## 2014-04-23 DIAGNOSIS — E785 Hyperlipidemia, unspecified: Secondary | ICD-10-CM | POA: Insufficient documentation

## 2014-04-23 DIAGNOSIS — R531 Weakness: Secondary | ICD-10-CM | POA: Insufficient documentation

## 2014-04-23 DIAGNOSIS — Z96649 Presence of unspecified artificial hip joint: Secondary | ICD-10-CM | POA: Diagnosis not present

## 2014-04-23 DIAGNOSIS — I10 Essential (primary) hypertension: Secondary | ICD-10-CM | POA: Diagnosis not present

## 2014-04-23 DIAGNOSIS — I482 Chronic atrial fibrillation: Secondary | ICD-10-CM | POA: Insufficient documentation

## 2014-04-23 DIAGNOSIS — G4733 Obstructive sleep apnea (adult) (pediatric): Secondary | ICD-10-CM | POA: Insufficient documentation

## 2014-04-23 DIAGNOSIS — I255 Ischemic cardiomyopathy: Secondary | ICD-10-CM | POA: Insufficient documentation

## 2014-04-23 DIAGNOSIS — E669 Obesity, unspecified: Secondary | ICD-10-CM | POA: Insufficient documentation

## 2014-04-23 DIAGNOSIS — R269 Unspecified abnormalities of gait and mobility: Secondary | ICD-10-CM

## 2014-04-23 DIAGNOSIS — R262 Difficulty in walking, not elsewhere classified: Secondary | ICD-10-CM | POA: Diagnosis not present

## 2014-04-23 NOTE — Therapy (Signed)
[facility] [address], [place], [place], [address] Phone: [phone]   Fax:  [phone]  Physical Therapy Evaluation  Patient Details  Name: Harold Jennings MRN: [id number] Date of Birth: [birth year] Referring Provider:  London Pepper, MD  Encounter Date: 04/23/2014      PT End of Session - 04/23/14 1519    Visit Number 1   Number of Visits 10  Medicare   Date for PT Re-Evaluation 06/18/14   PT Start Time 1450   PT Stop Time 1515   PT Time Calculation (min) 25 min   Activity Tolerance Patient tolerated treatment well   Behavior During Therapy [facility] for tasks assessed/performed      Past Medical History  Diagnosis Date  . Dyslipidemia   . HTN (hypertension)   . Obesity   . CAD (coronary artery disease)     Silent MI sometime prior to 2000. S/P cath in 2000 showing a total mid RCA with collaterals from the LCX. EF is 40 to50%; Last Myoview in 2010 showing inferior scar without ischemia. EF was 52%.  . Increased liver enzymes     felt to be a fatty liver per ultrasound in 2011  . Dyspnea   . Ischemic cardiomyopathy   . Spinal stenosis     Past Surgical History  Procedure Laterality Date  . Total hip arthroplasty    . Eye surgery    . Lower back surgery      spinal stenosis  . Shoulder surgery    . Cardioversion N/A 06/06/2012    Procedure: CARDIOVERSION;  Surgeon: Thayer Headings, MD;  Location[facility] ENDOSCOPY;  Service: Cardiovascular;  Laterality: N/A;    There were no vitals filed for this visit.  Visit Diagnosis:  Weakness generalized - Plan: PT plan of care cert/re-cert  Abnormality of gait - Plan: PT plan of care cert/re-cert      Subjective Assessment - 04/23/14 1452    Subjective Pt is a 81 y.o. male who presents to PT with weakness s/p hospitilization until 04/11/14.  Pt had a 3 day stay in the hospital.  Pt has been walking with a cane for about a year.     Patient is accompained by: Family  member   Pertinent History hospitalization for diverticulitis.  Pt will likely have a surgery in 6 weeks.     How long can you walk comfortably? 30 minutes max    Patient Stated Goals improve endurance and strength.  Improve strength to be stronger for surgery (6 weeks?)   Currently in Pain? No/denies            [facility] PT Assessment - 04/23/14 0001    Assessment   Medical Diagnosis weakness (R53.1)   Onset Date 04/07/14   Next MD Visit 1 month   Precautions   Precautions Fall   Restrictions   Weight Bearing Restrictions No   Balance Screen   Has the patient fallen in the past 6 months No   Has the patient had a decrease in activity level because of a fear of falling?  No   Is the patient reluctant to leave their home because of a fear of falling?  No   Home Environment   Living Enviornment Private residence   Home Access Stairs to enter   Entrance Stairs-Number of Steps [place] One level   Prior Function   Level of Independence Independent with basic ADLs   Vocation Retired  Leisure none   Cognition   Overall Cognitive Status Within Functional Limits for tasks assessed   Posture/Postural Control   Posture/Postural Control Postural limitations   Postural Limitations Rounded Shoulders;Forward head   ROM / Strength   AROM / PROM / Strength Strength   Strength   Overall Strength Deficits   Overall Strength Comments 4+/5 bilateral LE strength, 4/5 bilateral UE strength throughout   Strength Assessment Site Hip;Knee;Shoulder   Transfers   Transfers Sit to Stand   Sit to Stand 6: Modified independent (Device/Increase time)   Ambulation/Gait   Ambulation/Gait Yes   Ambulation/Gait Assistance 6: Modified independent (Device/Increase time)   Ambulation Distance (Feet) 75 Feet   Assistive device Straight cane   Gait Pattern Step-through pattern   Ambulation Surface Level   Gait velocity decreased gait velocity   Balance   Balance Assessed Yes   Standardized  Balance Assessment   Standardized Balance Assessment Timed Up and Go Test   Timed Up and Go Test   Normal TUG (seconds) 21  20                   [facility] Adult PT Treatment/Exercise - 04/23/14 0001    Exercises   Exercises Knee/Hip;Ankle;Shoulder   Knee/Hip Exercises: Seated   Long Arc Quad Strengthening;Both;20 reps   Other Seated Knee Exercises seated marching and heel/toe raises 2x10   Shoulder Exercises: Seated   Other Seated Exercises horizontal abduction: yellow x 12                PT Education - 04/23/14 1509    Education provided Yes   Education Details HEP: seated marching, heel/toe raises, long arc quads, yellow theraband: horizontal abduction   Person(s) Educated Patient;Spouse   Methods Explanation;Demonstration;Handout   Comprehension Verbalized understanding;Returned demonstration          PT Short Term Goals - 04/23/14 1522    PT SHORT TERM GOAL #1   Title be independent in initial HEP   Time 4   Period Weeks   Status New   PT SHORT TERM GOAL #2   Title reduce TUG to < or = to 18 seconds to improve safety   Time 4   Period Weeks   Status New   PT SHORT TERM GOAL #3   Title improve LE strength to perform sit to stand with moderate UE support   Time 4   Period Weeks   Status New           PT Long Term Goals - 04/23/14 1522    PT LONG TERM GOAL #1   Title be independent in advanced HEP   Time 8   Period Weeks   Status New   PT LONG TERM GOAL #2   Title perform TUG in < or = to 16 seconds   Time 8   Period Weeks   Status New   PT LONG TERM GOAL #3   Title demonstrate 4+/5 UE strength to improve use with ADLs   Time 8   Period Weeks   Status New   PT LONG TERM GOAL #4   Title verbalize understanding of how to progress HEP for continued strength gains   Time 8   Period Weeks   Status New               Plan - 04/23/14 1520    Clinical Impression Statement Pt presents to PT with UE and LE weakness and endurance  deficits.  Pt will  benefit from PT for advancement of exercises to improve strength after hospital stay.     Pt will benefit from skilled therapeutic intervention in order to improve on the following deficits Difficulty walking;Postural dysfunction;Decreased endurance;Decreased strength   Rehab Potential Good   PT Frequency 2x / week   PT Duration 8 weeks   PT Treatment/Interventions ADLs/Self Care Home Management;Therapeutic exercise;Balance training;Gait training;Neuromuscular re-education;Patient/family education   PT Next Visit Plan Strength and endurance training, practice sit to stand   Consulted and Agree with Plan of Care Patient          G-Codes - Apr 27, 2014 1525    Functional Assessment Tool Used TUG and clinical   Functional Limitation Mobility: Walking and moving around   Mobility: Walking and Moving Around Current Status (E3953) At least 60 percent but less than 80 percent impaired, limited or restricted   Mobility: Walking and Moving Around Goal Status [phone]) At least 40 percent but less than 60 percent impaired, limited or restricted       Problem List Patient Active Problem List   Diagnosis Date Noted  . Colonic diverticular abscess 04/09/2014  . BRBPR (bright red blood per rectum) 04/09/2014  . Sigmoid diverticulitis 01/14/2014  . Chronic atrial fibrillation 01/14/2014  . Chronic systolic congestive heart failure 01/14/2014  . Intra-abdominal abscess 01/14/2014  . Merkel cell carcinoma 06/30/2013  . Weight loss 06/30/2013  . Skin lesion 06/30/2013  . Atrial fibrillation 06/30/2013  . OSA (obstructive sleep apnea) 03/25/2013  . Chronic diastolic heart failure, NYHA class 3 04/11/2011  . DOE (dyspnea on exertion) 03/31/2011  . GERD 03/09/2009  . ABDOMINAL PAIN, GENERALIZED 03/09/2009  . DYSPEPSIA&OTHER [facility] DISORDERS FUNCTION STOMACH 02/08/2009  . PERSONAL HISTORY OF COLONIC POLYPS 02/08/2009  . LEG PAIN 01/04/2009  . MELANOMA OF SKIN, SITE UNSPECIFIED  06/03/2008  . SPINAL STENOSIS, LUMBAR 06/03/2008  . DYSLIPIDEMIA 05/23/2008  . OBESITY 05/23/2008  . Essential hypertension 05/23/2008  . CAD 05/23/2008    Edword Cu, PT 27-Apr-2014, 3:27 PM  [facility] [address] [place], [place], [address] Phone: [phone]   Fax:  [phone]

## 2014-04-23 NOTE — Patient Instructions (Signed)
KNEE: Extension, Long Arc Quad (Weight)  Place weight around leg. Raise leg until knee is straight. Hold _5__ seconds. Use ___ lb weight. _10__ reps per set (each leg), 4-5__ sets per day, __7_ days per week  Copyright  VHI. All rights reserved.    Reverse Fly / Shoulder Retraction   Extend both arms in front of body at shoulder height, palms down, holding band. Move arms out to sides, squeeze shoulder blades together. Repeat _10__ times. Do _4-5__ sessions per day.   Copyright  VHI. All rights reserved.     Knee Raise   Lift knee and then lower it. Repeat with other knee. Repeat _10__ times each leg. Do _4-5___ sessions per day.  http://[email or web address].exer.us/445   Copyright  VHI. All rights reserved.  Toe Up   Gently rise up on toes and back on heels. Repeat _20___ times. Do 4-5____ sessions per day.  http://[email or web address].exer.us/455   Copyright  VHI. All rights reserved.

## 2014-04-30 ENCOUNTER — Ambulatory Visit: Payer: Medicare Other

## 2014-04-30 DIAGNOSIS — R531 Weakness: Secondary | ICD-10-CM | POA: Diagnosis not present

## 2014-04-30 DIAGNOSIS — R269 Unspecified abnormalities of gait and mobility: Secondary | ICD-10-CM

## 2014-04-30 NOTE — Therapy (Signed)
[facility] [address], [place], [place], [address] Phone: [phone]   Fax:  [phone]  Physical Therapy Treatment  Patient Details  Name: Harold Jennings MRN: [id number] Date of Birth: March 12, 1932 Referring Provider:  London Pepper, MD  Encounter Date: 04/30/2014      PT End of Session - 04/30/14 1519    Visit Number 2   Number of Visits 10  Medicare   Date for PT Re-Evaluation 06/18/14   PT Start Time 0223   PT Stop Time 1527   PT Time Calculation (min) 40 min   Activity Tolerance Patient tolerated treatment well   Behavior During Therapy [facility] for tasks assessed/performed      Past Medical History  Diagnosis Date  . Dyslipidemia   . HTN (hypertension)   . Obesity   . CAD (coronary artery disease)     Silent MI sometime prior to 2000. S/P cath in 2000 showing a total mid RCA with collaterals from the LCX. EF is 40 to50%; Last Myoview in 2010 showing inferior scar without ischemia. EF was 52%.  . Increased liver enzymes     felt to be a fatty liver per ultrasound in 2011  . Dyspnea   . Ischemic cardiomyopathy   . Spinal stenosis     Past Surgical History  Procedure Laterality Date  . Total hip arthroplasty    . Eye surgery    . Lower back surgery      spinal stenosis  . Shoulder surgery    . Cardioversion N/A 06/06/2012    Procedure: CARDIOVERSION;  Surgeon: Thayer Headings, MD;  Location: [facility] ENDOSCOPY;  Service: Cardiovascular;  Laterality: N/A;    There were no vitals filed for this visit.  Visit Diagnosis:  Weakness generalized  Abnormality of gait      Subjective Assessment - 04/30/14 1451    Subjective Pt has not been compliant with HEP.  He has done exercises a few times.     Patient Stated Goals improve endurance and strength.  Improve strength to be stronger for surgery (6 weeks?)   Currently in Pain? No/denies                       [facility] Adult PT  Treatment/Exercise - 04/30/14 0001    Knee/Hip Exercises: Aerobic   Stationary Bike Level 1 x 6 minutes   Knee/Hip Exercises: Standing   Other Standing Knee Exercises sit to stand: 20 reps   Other Standing Knee Exercises standing hip abduction and extension 2x10 bil. each   Knee/Hip Exercises: Seated   Long Arc Quad Strengthening;Both;20 reps   Other Seated Knee Exercises seated marching and heel/toe raises 2x10   Other Seated Knee Exercises seated hip adduction against ball x 20, abduction with red band x 20   Shoulder Exercises: Seated   Other Seated Exercises horizontal abduction: yellow x 20  good demo of all HEP   Other Seated Exercises front raises: 1# 2x10                  PT Short Term Goals - 04/30/14 1453    PT SHORT TERM GOAL #1   Title be independent in initial HEP   Time 4   Period Weeks   Status On-going  Pt is able to demonstrate correctly but not compliant   PT SHORT TERM GOAL #3   Title improve LE strength to perform sit to stand with moderate UE support  Time 4   Period Weeks   Status On-going  No change since evaluation           PT Long Term Goals - 04/23/14 1522    PT LONG TERM GOAL #1   Title be independent in advanced HEP   Time 8   Period Weeks   Status New   PT LONG TERM GOAL #2   Title perform TUG in < or = to 16 seconds   Time 8   Period Weeks   Status New   PT LONG TERM GOAL #3   Title demonstrate 4+/5 UE strength to improve use with ADLs   Time 8   Period Weeks   Status New   PT LONG TERM GOAL #4   Title verbalize understanding of how to progress HEP for continued strength gains   Time 8   Period Weeks   Status New               Plan - 04/30/14 1452    Clinical Impression Statement Pt with only 1 session after evaluation and has had limited compliance with HEP.  No significant progress toward goals.     Pt will benefit from skilled therapeutic intervention in order to improve on the following deficits  Difficulty walking;Postural dysfunction;Decreased endurance;Decreased strength   Rehab Potential Good   PT Frequency 2x / week   PT Duration 8 weeks   PT Treatment/Interventions ADLs/Self Care Home Management;Therapeutic exercise;Balance training;Gait training;Neuromuscular re-education;Patient/family education   PT Next Visit Plan Strength and endurance training, continue to practice sit to stand   Consulted and Agree with Plan of Care Patient        Problem List Patient Active Problem List   Diagnosis Date Noted  . Colonic diverticular abscess 04/09/2014  . BRBPR (bright red blood per rectum) 04/09/2014  . Sigmoid diverticulitis 01/14/2014  . Chronic atrial fibrillation 01/14/2014  . Chronic systolic congestive heart failure 01/14/2014  . Intra-abdominal abscess 01/14/2014  . Merkel cell carcinoma 06/30/2013  . Weight loss 06/30/2013  . Skin lesion 06/30/2013  . Atrial fibrillation 06/30/2013  . OSA (obstructive sleep apnea) 03/25/2013  . Chronic diastolic heart failure, NYHA class 3 04/11/2011  . DOE (dyspnea on exertion) 03/31/2011  . GERD 03/09/2009  . ABDOMINAL PAIN, GENERALIZED 03/09/2009  . DYSPEPSIA&OTHER [facility] DISORDERS FUNCTION STOMACH 02/08/2009  . PERSONAL HISTORY OF COLONIC POLYPS 02/08/2009  . LEG PAIN 01/04/2009  . MELANOMA OF SKIN, SITE UNSPECIFIED 06/03/2008  . SPINAL STENOSIS, LUMBAR 06/03/2008  . DYSLIPIDEMIA 05/23/2008  . OBESITY 05/23/2008  . Essential hypertension 05/23/2008  . CAD 05/23/2008    Ronell Boldin,PT 04/30/2014, 3:24 PM  [facility] [address] [place], [place], [address] Phone: [phone]   Fax:  [phone]

## 2014-05-05 ENCOUNTER — Ambulatory Visit: Payer: Medicare Other

## 2014-05-05 ENCOUNTER — Ambulatory Visit (INDEPENDENT_AMBULATORY_CARE_PROVIDER_SITE_OTHER): Payer: Medicare Other | Admitting: Cardiovascular Disease

## 2014-05-05 ENCOUNTER — Encounter: Payer: Self-pay | Admitting: Cardiovascular Disease

## 2014-05-05 VITALS — BP 114/64 | HR 102 | Ht 67.0 in | Wt 205.1 lb

## 2014-05-05 DIAGNOSIS — I4891 Unspecified atrial fibrillation: Secondary | ICD-10-CM | POA: Diagnosis not present

## 2014-05-05 DIAGNOSIS — R269 Unspecified abnormalities of gait and mobility: Secondary | ICD-10-CM

## 2014-05-05 DIAGNOSIS — R531 Weakness: Secondary | ICD-10-CM

## 2014-05-05 NOTE — Progress Notes (Signed)
Cardiology Office Note   Date:  05/05/2014   ID:  Harold Jennings, DOB [birth year], MRN [id number]  PCP:  London Pepper, MD  Cardiologist:  Sherren Mocha, MD    Chief Complaint  Patient presents with  . Pre-op Exam    surgical clerance  . Fatigue     History of Present Illness: Harold Jennings is a 81 y.o. male who presents for follow-up of chronic atrial fibrillation on chronic anticoagulation with Xareltoand heart rate control with metoprolol succinate. Other problems include CAD with known total occlusion of the RCA and mild segmental LV dysfunction, obesity, and Merkel cell carcinoma.   The patient was recently hospitalized with rectal bleeding. He has known sigmoid diverticulitis with diverticular abscess. He also has a colovesical fistula. He has been treated with multiple courses of antibiotics and has apparently failed conservative management. There has been consideration for surgery but there are concerns about his cardiac condition as well as his poor functional capacity.  He has been stable from a cardiac perspective. Denies chest pain or pressure, orthopnea, PND, or exertional dyspnea. His primary complaint is generalized fatigue.   Past Medical History  Diagnosis Date  . Dyslipidemia   . HTN (hypertension)   . Obesity   . CAD (coronary artery disease)     Silent MI sometime prior to 2000. S/P cath in 2000 showing a total mid RCA with collaterals from the LCX. EF is 40 to50%; Last Myoview in 2010 showing inferior scar without ischemia. EF was 52%.  . Increased liver enzymes     felt to be a fatty liver per ultrasound in 2011  . Dyspnea   . Ischemic cardiomyopathy   . Spinal stenosis     Past Surgical History  Procedure Laterality Date  . Total hip arthroplasty    . Eye surgery    . Lower back surgery      spinal stenosis  . Shoulder surgery    . Cardioversion N/A 06/06/2012    Procedure: CARDIOVERSION;  Surgeon: Thayer Headings, MD;  Location: [facility] ENDOSCOPY;   Service: Cardiovascular;  Laterality: N/A;    Current Outpatient Prescriptions  Medication Sig Dispense Refill  . ciprofloxacin (CIPRO) 500 MG tablet Take 1 tablet (500 mg total) by mouth 2 (two) times daily. 20 tablet 2  . diphenhydrAMINE (BENADRYL) 25 mg capsule Take 25 mg by mouth at bedtime.     Marland Kitchen doxazosin (CARDURA) 8 MG tablet Take 8 mg by mouth at bedtime.    Marland Kitchen ezetimibe (ZETIA) 10 MG tablet Take 10 mg by mouth daily.    . furosemide (LASIX) 40 MG tablet Take 40 mg by mouth as needed for fluid or edema (fluid).     . gabapentin (NEURONTIN) 600 MG tablet Take 300 mg by mouth at bedtime.     Marland Kitchen guaiFENesin (ROBITUSSIN) 100 MG/5ML SOLN Take 20 mLs (400 mg total) by mouth every 6 (six) hours as needed for cough. 1200 mL 0  . levothyroxine (SYNTHROID, LEVOTHROID) 100 MCG tablet Take 100 mcg by mouth daily.  12  . loperamide (IMODIUM) 1 MG/5ML solution Take 10 mLs (2 mg total) by mouth every 6 (six) hours as needed for diarrhea or loose stools. 120 mL 0  . metoprolol succinate (TOPROL-XL) 50 MG 24 hr tablet Take 50 mg by mouth daily. Take with or immediately following a meal.    . metroNIDAZOLE (FLAGYL) 500 MG tablet Take 1 tablet (500 mg total) by mouth 3 (three) times daily. 30 tablet 2  .  Multiple Vitamin (MULTIVITAMIN) tablet Take 1 tablet by mouth daily.    . nitrofurantoin, macrocrystal-monohydrate, (MACROBID) 100 MG capsule Take 100 mg by mouth at bedtime.    . Rivaroxaban (XARELTO) 15 MG TABS tablet Take 1 tablet (15 mg total) by mouth daily with supper. 30 tablet 0  . saccharomyces boulardii (FLORASTOR) 250 MG capsule Take 250 mg by mouth 2 (two) times daily.    . [DISCONTINUED] hydrochlorothiazide 25 MG tablet Take 1 tablet (25 mg total) by mouth daily. 30 tablet 2   No current facility-administered medications for this visit.    Allergies:   Review of patient's allergies indicates no known allergies.   Social History:  The patient  reports that he has never smoked. He has never  used smokeless tobacco. He reports that he drinks alcohol. He reports that he does not use illicit drugs.   Family History:  The patient's  family history includes Diabetes in his father; Drug abuse in his mother; Heart attack in his father; Hypertension in his brother, brother, brother, father, and sister; Stroke in his father.    ROS:  Please see the history of present illness.  Otherwise, review of systems is positive for weight loss, anorexia, leg swelling, blood in stool, depression, gait instability, fatigue.  All other systems are reviewed and negative.    PHYSICAL EXAM: VS:  BP 114/64 mmHg  Pulse 102  Ht 5\' 7"  (1.702 m)  Wt 205 lb 1.9 oz (93.042 kg)  BMI 32.12 kg/m2 , BMI Body mass index is 32.12 kg/(m^2). GEN: Well nourished, well developed, in no acute distress HEENT: normal Neck: no JVD, no masses. No carotid bruits Cardiac: irregularly irregular without murmur or gallop                Respiratory:  clear to auscultation bilaterally, normal work of breathing GI: soft, nontender, nondistended, + BS MS: no deformity or atrophy Ext: 1+ pretibial edema Skin: warm and dry, no rash Neuro:  Strength and sensation are intact Psych: euthymic mood, full affect  EKG:  EKG is ordered today. The ekg ordered today shows atrial fibrillation atrial fibrillation 102 bpm, RBBB, age-indeterminate inferior infarct, borderline criteria for LVH  Recent Labs: 04/09/2014: ALT 18 04/11/2014: BUN 10; Creatinine 1.28; Hemoglobin 10.9*; Platelets 174; Potassium 3.5; Sodium 140   Lipid Panel     Component Value Date/Time   CHOL 183 01/04/2009 1009   TRIG 116.0 01/04/2009 1009   HDL 39.80 01/04/2009 1009   CHOLHDL 5 01/04/2009 1009   VLDL 23.2 01/04/2009 1009   LDLCALC 120* 01/04/2009 1009   LDLDIRECT 139.7 01/27/2008 1153      Wt Readings from Last 3 Encounters:  05/05/14 205 lb 1.9 oz (93.042 kg)  04/11/14 205 lb 7.5 oz (93.2 kg)  01/17/14 214 lb 8.1 oz (97.3 kg)     Cardiac Studies  Reviewed: 2D Echo 8.26.2015: Study Conclusions  - Left ventricle: The cavity size was mildly dilated. There was mild concentric hypertrophy. Systolic function was mildly to moderately reduced. The estimated ejection fraction was in the range of 40% to 45%. Wall motion was normal; there were no regional wall motion abnormalities. The study was not technically sufficient to allow evaluation of LV diastolic dysfunction due to atrial fibrillation. - Aortic valve: Trileaflet; moderately thickened, moderately calcified leaflets. Cusp separation was mildly reduced. There was mild stenosis. There was mild regurgitation. - Aortic root: The aortic root was normal in size. - Mitral valve: There was mild to moderate regurgitation directed centrally. -  Left atrium: The atrium was moderately to severely dilated. - Right ventricle: The cavity size was mildly dilated. Wall thickness was normal. Systolic function was normal. - Right atrium: The atrium was mildly dilated. - Tricuspid valve: There was mild regurgitation. - Pulmonic valve: There was mild regurgitation. - Pulmonary arteries: Systolic pressure was within the normal range. PA peak pressure: 31 mm Hg (S). - Pericardium, extracardiac: There was no pericardial effusion.  Impressions:  - Compared to the prior study in 2013 the left ventricle is now mildly dilated with mildly impaired systolic function, LVEF 01-02%. Mild to moderate mitral regurgitation. Severel lefta trial dilatation. Mild aortic stenosis and insufficiency. Normal RVSP.  ASSESSMENT AND PLAN: 1.  Chronic atrial fibrillation: tolerating rate-control and anticoagulation strategy. Does not appear to have specific cardiac symptoms at present. Rate control is marginal but he is on high-dose beta-blockade and I don't think Ca blockers are a good idea in the setting of LV dysfunction. If he has problems with RVR during the post-op period,  amiodarone would be a reasonable option for additional rate-control.  2. CAD, native vessel: no sx's of angina. Will continue current Rx.   3. Cardiomyopathy. Mild LV dysfunction, global pattern. May be tachycardia-mediated. Currently well-compensated.  4. Preoperative evaluation: pt is at moderate risk of perioperative cardiac events. However, he is stable without active angina. He needs surgery in setting of a colovesical fistula and I would not recommend any further preoperative CV testing. Ok to proceed. I will be available if any cardiac problems arise.    Current medicines are reviewed with the patient today.  The patient does not have concerns regarding medicines.  Labs/ tests ordered today include:  No orders of the defined types were placed in this encounter.   Signed, Sherren Mocha, MD  05/05/2014 12:14 PM    [facility] [place], [place], [place]  [address] Phone: [phone]; Fax: [phone]

## 2014-05-05 NOTE — Patient Instructions (Addendum)
Medication Instructions:  No changes today.  Labwork: None today.  Testing/Procedures: None today.  Follow-Up: Your physician wants you to follow-up in: 6 months with Dr Burt Knack. (October 2016).  You will receive a reminder letter in the mail two months in advance. If you don't receive a letter, please call our office to schedule the follow-up appointment.

## 2014-05-05 NOTE — Therapy (Signed)
[facility] [address], [place], [place], [address] Phone: [phone]   Fax:  [phone]  Physical Therapy Treatment  Patient Details  Name: Harold Jennings MRN: [id number] Date of Birth: 10-17-1932 Referring Provider:  London Pepper, MD  Encounter Date: 05/05/2014      PT End of Session - 05/05/14 1609    Visit Number 3   Number of Visits 10  Medicare   Date for PT Re-Evaluation 06/18/14   PT Start Time 1532   PT Stop Time 1613   PT Time Calculation (min) 41 min   Activity Tolerance Patient tolerated treatment well   Behavior During Therapy [facility] for tasks assessed/performed      Past Medical History  Diagnosis Date  . Dyslipidemia   . HTN (hypertension)   . Obesity   . CAD (coronary artery disease)     Silent MI sometime prior to 2000. S/P cath in 2000 showing a total mid RCA with collaterals from the LCX. EF is 40 to50%; Last Myoview in 2010 showing inferior scar without ischemia. EF was 52%.  . Increased liver enzymes     felt to be a fatty liver per ultrasound in 2011  . Dyspnea   . Ischemic cardiomyopathy   . Spinal stenosis     Past Surgical History  Procedure Laterality Date  . Total hip arthroplasty    . Eye surgery    . Lower back surgery      spinal stenosis  . Shoulder surgery    . Cardioversion N/A 06/06/2012    Procedure: CARDIOVERSION;  Surgeon: Thayer Headings, MD;  Location: [facility] ENDOSCOPY;  Service: Cardiovascular;  Laterality: N/A;    There were no vitals filed for this visit.  Visit Diagnosis:  Weakness generalized  Abnormality of gait      Subjective Assessment - 05/05/14 1538    Subjective Pt reports that he has not been feeling well and has not done his exercises.     Currently in Pain? No/denies            [facility] PT Assessment - 05/05/14 0001    Timed Up and Go Test   Normal TUG (seconds) 20                     [facility] Adult PT Treatment/Exercise -  05/05/14 0001    Knee/Hip Exercises: Aerobic   Stationary Bike Level 1 x 6 minutes   Knee/Hip Exercises: Standing   Other Standing Knee Exercises sit to stand: 20 reps   Other Standing Knee Exercises standing hip abduction and extension 2x10 bil. each   Knee/Hip Exercises: Seated   Long Arc Quad Strengthening;Both;20 reps   Other Seated Knee Exercises seated hip adduction against ball x 20, abduction with red band x 20   Shoulder Exercises: Seated   Other Seated Exercises horizontal abduction: yellow x 20  good demo of all HEP   Other Seated Exercises front raises: 1# 2x10                  PT Short Term Goals - 05/05/14 1539    PT SHORT TERM GOAL #1   Title be independent in initial HEP   Time 4   Period Weeks   Status On-going  Pt able to demo exercises in clinic- not compliant at home   PT SHORT TERM GOAL #2   Title reduce TUG to < or = to 18 seconds to improve safety  Period Weeks   Status On-going  20 seconds   PT SHORT TERM GOAL #3   Title improve LE strength to perform sit to stand with moderate UE support   Time 4   Period Weeks   Status On-going  Continued max UE support            PT Long Term Goals - 04/23/14 1522    PT LONG TERM GOAL #1   Title be independent in advanced HEP   Time 8   Period Weeks   Status New   PT LONG TERM GOAL #2   Title perform TUG in < or = to 16 seconds   Time 8   Period Weeks   Status New   PT LONG TERM GOAL #3   Title demonstrate 4+/5 UE strength to improve use with ADLs   Time 8   Period Weeks   Status New   PT LONG TERM GOAL #4   Title verbalize understanding of how to progress HEP for continued strength gains   Time 8   Period Weeks   Status New               Plan - 05/05/14 1546    Clinical Impression Statement Pt with only 2 sessions after evaluation and reports limited compliance with HEP at home.  See goals for status.     Pt will benefit from skilled therapeutic intervention in order to  improve on the following deficits Difficulty walking;Postural dysfunction;Decreased endurance;Decreased strength   Rehab Potential Good   PT Frequency 2x / week   PT Duration 8 weeks   PT Treatment/Interventions ADLs/Self Care Home Management;Therapeutic exercise;Balance training;Gait training;Neuromuscular re-education;Patient/family education   PT Next Visit Plan Strength and endurance training, continue to practice sit to stand   Consulted and Agree with Plan of Care Patient        Problem List Patient Active Problem List   Diagnosis Date Noted  . Colonic diverticular abscess 04/09/2014  . BRBPR (bright red blood per rectum) 04/09/2014  . Sigmoid diverticulitis 01/14/2014  . Chronic atrial fibrillation 01/14/2014  . Chronic systolic congestive heart failure 01/14/2014  . Intra-abdominal abscess 01/14/2014  . Merkel cell carcinoma 06/30/2013  . Weight loss 06/30/2013  . Skin lesion 06/30/2013  . Atrial fibrillation 06/30/2013  . OSA (obstructive sleep apnea) 03/25/2013  . Chronic diastolic heart failure, NYHA class 3 04/11/2011  . DOE (dyspnea on exertion) 03/31/2011  . GERD 03/09/2009  . ABDOMINAL PAIN, GENERALIZED 03/09/2009  . DYSPEPSIA&OTHER [facility] DISORDERS FUNCTION STOMACH 02/08/2009  . PERSONAL HISTORY OF COLONIC POLYPS 02/08/2009  . LEG PAIN 01/04/2009  . MELANOMA OF SKIN, SITE UNSPECIFIED 06/03/2008  . SPINAL STENOSIS, LUMBAR 06/03/2008  . DYSLIPIDEMIA 05/23/2008  . OBESITY 05/23/2008  . Essential hypertension 05/23/2008  . CAD 05/23/2008    TAKACS,KELLY, PT 05/05/2014, 4:14 PM  [facility] [address] [place], [place], [address] Phone: [phone]   Fax:  [phone]

## 2014-05-06 ENCOUNTER — Ambulatory Visit: Payer: Self-pay | Admitting: Surgery

## 2014-05-07 ENCOUNTER — Encounter: Payer: Self-pay | Admitting: Cardiovascular Disease

## 2014-05-07 ENCOUNTER — Ambulatory Visit: Payer: Medicare Other

## 2014-05-07 DIAGNOSIS — R531 Weakness: Secondary | ICD-10-CM | POA: Diagnosis not present

## 2014-05-07 DIAGNOSIS — R269 Unspecified abnormalities of gait and mobility: Secondary | ICD-10-CM

## 2014-05-07 NOTE — Therapy (Signed)
[facility] [address] [place], [place], [address] Phone: [phone]   Fax:  [phone]  Physical Therapy Treatment  Patient Details  Name: Harold Jennings MRN: [id number] Date of Birth: 1932/09/14 Referring Provider:  London Pepper, MD  Encounter Date: 05/07/2014      PT End of Session - 05/07/14 1612    Visit Number 4   Number of Visits 10  Medicare   Date for PT Re-Evaluation 06/18/14   PT Start Time 3154   PT Stop Time 1612   PT Time Calculation (min) 41 min   Activity Tolerance Patient tolerated treatment well   Behavior During Therapy [facility] for tasks assessed/performed      Past Medical History  Diagnosis Date  . Dyslipidemia   . HTN (hypertension)   . Obesity   . CAD (coronary artery disease)     Silent MI sometime prior to 2000. S/P cath in 2000 showing a total mid RCA with collaterals from the LCX. EF is 40 to50%; Last Myoview in 2010 showing inferior scar without ischemia. EF was 52%.  . Increased liver enzymes     felt to be a fatty liver per ultrasound in 2011  . Dyspnea   . Ischemic cardiomyopathy   . Spinal stenosis     Past Surgical History  Procedure Laterality Date  . Total hip arthroplasty    . Eye surgery    . Lower back surgery      spinal stenosis  . Shoulder surgery    . Cardioversion N/A 06/06/2012    Procedure: CARDIOVERSION;  Surgeon: Thayer Headings, MD;  Location: [facility] ENDOSCOPY;  Service: Cardiovascular;  Laterality: N/A;    There were no vitals filed for this visit.  Visit Diagnosis:  Weakness generalized  Abnormality of gait      Subjective Assessment - 05/07/14 1535    Subjective Pt is going to schedue his surgery soon.  "tired of feeling sick"   Currently in Pain? No/denies   Multiple Pain Sites No                         [facility] Adult PT Treatment/Exercise - 05/07/14 0001    Knee/Hip Exercises: Aerobic   Stationary Bike Level 1 x 7 minutes    Knee/Hip Exercises: Standing   Other Standing Knee Exercises sit to stand: 20 reps   Other Standing Knee Exercises standing hip abduction and extension 2x10 bil. each   Knee/Hip Exercises: Seated   Long Arc Quad Strengthening;Both;20 reps   Other Seated Knee Exercises seated hip adduction against ball x 20, abduction with red band x 20   Shoulder Exercises: Seated   Other Seated Exercises horizontal abduction: yellow x 20   Other Seated Exercises front raises: 1# 2x10                  PT Short Term Goals - 05/05/14 1539    PT SHORT TERM GOAL #1   Title be independent in initial HEP   Time 4   Period Weeks   Status On-going  Pt able to demo exercises in clinic- not compliant at home   PT SHORT TERM GOAL #2   Title reduce TUG to < or = to 18 seconds to improve safety   Period Weeks   Status On-going  20 seconds   PT SHORT TERM GOAL #3   Title improve LE strength to perform sit to stand with moderate UE  support   Time 4   Period Weeks   Status On-going  Continued max UE support            PT Long Term Goals - 04/23/14 1522    PT LONG TERM GOAL #1   Title be independent in advanced HEP   Time 8   Period Weeks   Status New   PT LONG TERM GOAL #2   Title perform TUG in < or = to 16 seconds   Time 8   Period Weeks   Status New   PT LONG TERM GOAL #3   Title demonstrate 4+/5 UE strength to improve use with ADLs   Time 8   Period Weeks   Status New   PT LONG TERM GOAL #4   Title verbalize understanding of how to progress HEP for continued strength gains   Time 8   Period Weeks   Status New               Plan - 05/07/14 1540    Clinical Impression Statement Pt with limited endurance and strength.  Pt tolerated exercise well today in the clinic.  Pt denies compliance with HEP at home.     Pt will benefit from skilled therapeutic intervention in order to improve on the following deficits Difficulty walking;Postural dysfunction;Decreased  endurance;Decreased strength   Rehab Potential Good   PT Frequency 2x / week   PT Duration 8 weeks   PT Treatment/Interventions ADLs/Self Care Home Management;Therapeutic exercise;Balance training;Gait training;Neuromuscular re-education;Patient/family education   PT Next Visit Plan Continue strength and endurance training for UE and LE.  Try arm bike.   Consulted and Agree with Plan of Care Patient        Problem List Patient Active Problem List   Diagnosis Date Noted  . Colonic diverticular abscess 04/09/2014  . BRBPR (bright red blood per rectum) 04/09/2014  . Sigmoid diverticulitis 01/14/2014  . Chronic atrial fibrillation 01/14/2014  . Chronic systolic congestive heart failure 01/14/2014  . Intra-abdominal abscess 01/14/2014  . Merkel cell carcinoma 06/30/2013  . Weight loss 06/30/2013  . Skin lesion 06/30/2013  . Atrial fibrillation 06/30/2013  . OSA (obstructive sleep apnea) 03/25/2013  . Chronic diastolic heart failure, NYHA class 3 04/11/2011  . DOE (dyspnea on exertion) 03/31/2011  . GERD 03/09/2009  . ABDOMINAL PAIN, GENERALIZED 03/09/2009  . DYSPEPSIA&OTHER [facility] DISORDERS FUNCTION STOMACH 02/08/2009  . PERSONAL HISTORY OF COLONIC POLYPS 02/08/2009  . LEG PAIN 01/04/2009  . MELANOMA OF SKIN, SITE UNSPECIFIED 06/03/2008  . SPINAL STENOSIS, LUMBAR 06/03/2008  . DYSLIPIDEMIA 05/23/2008  . OBESITY 05/23/2008  . Essential hypertension 05/23/2008  . CAD 05/23/2008    TAKACS,KELLY, PT 05/07/2014, 4:13 PM  [facility] [address] [place], [place], [address] Phone: [phone]   Fax:  [phone]

## 2014-05-12 ENCOUNTER — Ambulatory Visit: Payer: Medicare Other | Admitting: Physical Therapy

## 2014-05-12 ENCOUNTER — Encounter: Payer: Self-pay | Admitting: Physical Therapy

## 2014-05-12 DIAGNOSIS — R531 Weakness: Secondary | ICD-10-CM

## 2014-05-12 DIAGNOSIS — R269 Unspecified abnormalities of gait and mobility: Secondary | ICD-10-CM

## 2014-05-12 NOTE — Therapy (Signed)
[facility] [address], [place], [place], [address] Phone: [phone]   Fax:  [phone]  Physical Therapy Treatment  Patient Details  Name: Harold Jennings MRN: [id number] Date of Birth: 09/12/1932 Referring Provider:  London Pepper, MD  Encounter Date: 05/12/2014      PT End of Session - 05/12/14 1614    Visit Number 5   Number of Visits 10   Date for PT Re-Evaluation 06/18/14   PT Start Time 2263  pt arrived late    PT Stop Time 1629   PT Time Calculation (min) 39 min   Activity Tolerance Patient tolerated treatment well   Behavior During Therapy [facility] for tasks assessed/performed      Past Medical History  Diagnosis Date  . Dyslipidemia   . HTN (hypertension)   . Obesity   . CAD (coronary artery disease)     Silent MI sometime prior to 2000. S/P cath in 2000 showing a total mid RCA with collaterals from the LCX. EF is 40 to50%; Last Myoview in 2010 showing inferior scar without ischemia. EF was 52%.  . Increased liver enzymes     felt to be a fatty liver per ultrasound in 2011  . Dyspnea   . Ischemic cardiomyopathy   . Spinal stenosis     Past Surgical History  Procedure Laterality Date  . Total hip arthroplasty    . Eye surgery    . Lower back surgery      spinal stenosis  . Shoulder surgery    . Cardioversion N/A 06/06/2012    Procedure: CARDIOVERSION;  Surgeon: Thayer Headings, MD;  Location: [facility] ENDOSCOPY;  Service: Cardiovascular;  Laterality: N/A;    There were no vitals filed for this visit.  Visit Diagnosis:  Weakness generalized  Abnormality of gait      Subjective Assessment - 05/12/14 1557    Subjective Pt is feelling tired and is ready to have the surgery in the hope that will help   Pertinent History hospitalization for diverticulitis.  Pt will likely have a surgery in 6 weeks.     How long can you walk comfortably? 30 minutes max    Patient Stated Goals improve endurance and  strength.  Improve strength to be stronger for surgery (6 weeks?)   Currently in Pain? No/denies   Multiple Pain Sites No                         [facility] Adult PT Treatment/Exercise - 05/12/14 0001    Bed Mobility   Bed Mobility --  Pt wishes to be called [name]   Knee/Hip Exercises: Aerobic   Stationary Bike Level 1 x 8 minutes   Knee/Hip Exercises: Standing   Other Standing Knee Exercises sit to stand: x 74min able to perform 18 reps   Other Standing Knee Exercises standing hip abduction and extension 2x10 bil. each   Knee/Hip Exercises: Seated   Long Arc Quad Strengthening;Both;20 reps;Weights  2.5#   Other Seated Knee Exercises seated hip adduction against ball x 20, abduction with red band x 20   Shoulder Exercises: Seated   Other Seated Exercises horizontal abduction: yellow x 20   Other Seated Exercises front raises: 1# 2x10                  PT Short Term Goals - 05/12/14 1622    PT SHORT TERM GOAL #1   Title be independent in  initial HEP   Time 4   Period Weeks   Status On-going   PT SHORT TERM GOAL #2   Title reduce TUG to < or = to 18 seconds to improve safety   Time 4   Period Weeks   Status On-going   PT SHORT TERM GOAL #3   Title improve LE strength to perform sit to stand with moderate UE support   Time 4   Period Weeks   Status On-going           PT Long Term Goals - 04/23/14 1522    PT LONG TERM GOAL #1   Title be independent in advanced HEP   Time 8   Period Weeks   Status New   PT LONG TERM GOAL #2   Title perform TUG in < or = to 16 seconds   Time 8   Period Weeks   Status New   PT LONG TERM GOAL #3   Title demonstrate 4+/5 UE strength to improve use with ADLs   Time 8   Period Weeks   Status New   PT LONG TERM GOAL #4   Title verbalize understanding of how to progress HEP for continued strength gains   Time 8   Period Weeks   Status New               Plan - 05/12/14 1621    Clinical Impression  Statement Pt with good compliance and determination with activities in PT clinic, to improve strength and endurance   Pt will benefit from skilled therapeutic intervention in order to improve on the following deficits Difficulty walking;Postural dysfunction;Decreased endurance;Decreased strength   Rehab Potential Good   PT Frequency 2x / week   PT Duration 8 weeks   PT Treatment/Interventions ADLs/Self Care Home Management;Therapeutic exercise;Balance training;Gait training;Neuromuscular re-education;Patient/family education   PT Next Visit Plan Continue strength and endurance training for UE and LE.  Try arm bike.   Consulted and Agree with Plan of Care Patient        Problem List Patient Active Problem List   Diagnosis Date Noted  . Colonic diverticular abscess 04/09/2014  . BRBPR (bright red blood per rectum) 04/09/2014  . Sigmoid diverticulitis 01/14/2014  . Chronic atrial fibrillation 01/14/2014  . Chronic systolic congestive heart failure 01/14/2014  . Intra-abdominal abscess 01/14/2014  . Merkel cell carcinoma 06/30/2013  . Weight loss 06/30/2013  . Skin lesion 06/30/2013  . Atrial fibrillation 06/30/2013  . OSA (obstructive sleep apnea) 03/25/2013  . Chronic diastolic heart failure, NYHA class 3 04/11/2011  . DOE (dyspnea on exertion) 03/31/2011  . GERD 03/09/2009  . ABDOMINAL PAIN, GENERALIZED 03/09/2009  . DYSPEPSIA&OTHER [facility] DISORDERS FUNCTION STOMACH 02/08/2009  . PERSONAL HISTORY OF COLONIC POLYPS 02/08/2009  . LEG PAIN 01/04/2009  . MELANOMA OF SKIN, SITE UNSPECIFIED 06/03/2008  . SPINAL STENOSIS, LUMBAR 06/03/2008  . DYSLIPIDEMIA 05/23/2008  . OBESITY 05/23/2008  . Essential hypertension 05/23/2008  . CAD 05/23/2008    NAUMANN-HOUEGNIFIO,Wilbern Pennypacker PTA 05/12/2014, 4:23 PM  [facility] [address], [place], [place], [address] Phone: [phone]   Fax:  [phone]

## 2014-05-14 ENCOUNTER — Ambulatory Visit: Payer: Medicare Other | Admitting: Physical Therapy

## 2014-05-14 ENCOUNTER — Encounter: Payer: Self-pay | Admitting: Physical Therapy

## 2014-05-14 DIAGNOSIS — R531 Weakness: Secondary | ICD-10-CM | POA: Diagnosis not present

## 2014-05-14 DIAGNOSIS — R269 Unspecified abnormalities of gait and mobility: Secondary | ICD-10-CM

## 2014-05-14 NOTE — Therapy (Signed)
[facility] [address], [place], [place], [address] Phone: [phone]   Fax:  [phone]  Physical Therapy Treatment  Patient Details  Name: Harold Jennings MRN: [id number] Date of Birth: Aug 07, 1932 Referring Provider:  London Pepper, MD  Encounter Date: 05/14/2014      PT End of Session - 05/14/14 1712    Visit Number 6   Number of Visits 10   Date for PT Re-Evaluation 06/18/14   PT Start Time 2633   PT Stop Time 1616   PT Time Calculation (min) 45 min   Activity Tolerance Patient tolerated treatment well   Behavior During Therapy [facility] for tasks assessed/performed      Past Medical History  Diagnosis Date  . Dyslipidemia   . HTN (hypertension)   . Obesity   . CAD (coronary artery disease)     Silent MI sometime prior to 2000. S/P cath in 2000 showing a total mid RCA with collaterals from the LCX. EF is 40 to50%; Last Myoview in 2010 showing inferior scar without ischemia. EF was 52%.  . Increased liver enzymes     felt to be a fatty liver per ultrasound in 2011  . Dyspnea   . Ischemic cardiomyopathy   . Spinal stenosis     Past Surgical History  Procedure Laterality Date  . Total hip arthroplasty    . Eye surgery    . Lower back surgery      spinal stenosis  . Shoulder surgery    . Cardioversion N/A 06/06/2012    Procedure: CARDIOVERSION;  Surgeon: Thayer Headings, MD;  Location: [facility] ENDOSCOPY;  Service: Cardiovascular;  Laterality: N/A;    There were no vitals filed for this visit.  Visit Diagnosis:  Weakness generalized  Abnormality of gait      Subjective Assessment - 05/14/14 1554    Subjective Pt is feelling tired and is ready to have the surgery in the hope that will help   Patient is accompained by: Family member   Pertinent History hospitalization for diverticulitis.  Pt will likely have a surgery in 6 weeks.     How long can you walk comfortably? 30 minutes max    Patient Stated  Goals improve endurance and strength.  Improve strength to be stronger for surgery (6 weeks?)   Currently in Pain? No/denies   Multiple Pain Sites No                         [facility] Adult PT Treatment/Exercise - 05/14/14 0001    Bed Mobility   Bed Mobility --  pt wishes to be called [name]   Knee/Hip Exercises: Aerobic   Stationary Bike Level 1 x 10 minutes   Knee/Hip Exercises: Standing   Other Standing Knee Exercises sit to stand: x 16min able to perform 15 only   Other Standing Knee Exercises standing hip abduction and extension 2x10 bil. each  2.5 # added   Knee/Hip Exercises: Seated   Long Arc Quad Strengthening;Both;20 reps;Weights  2.5#   Other Seated Knee Exercises seated hip adduction against ball x 20, abduction with red band x 20   Shoulder Exercises: Seated   Other Seated Exercises horizontal abduction: yellow x 20   Other Seated Exercises 3 way raises with 1#   2 x 10   Shoulder Exercises: ROM/Strengthening   UBE (Upper Arm Bike) L1 16min (3/3)  PT Short Term Goals - 05/12/14 1622    PT SHORT TERM GOAL #1   Title be independent in initial HEP   Time 4   Period Weeks   Status On-going   PT SHORT TERM GOAL #2   Title reduce TUG to < or = to 18 seconds to improve safety   Time 4   Period Weeks   Status On-going   PT SHORT TERM GOAL #3   Title improve LE strength to perform sit to stand with moderate UE support   Time 4   Period Weeks   Status On-going           PT Long Term Goals - 04/23/14 1522    PT LONG TERM GOAL #1   Title be independent in advanced HEP   Time 8   Period Weeks   Status New   PT LONG TERM GOAL #2   Title perform TUG in < or = to 16 seconds   Time 8   Period Weeks   Status New   PT LONG TERM GOAL #3   Title demonstrate 4+/5 UE strength to improve use with ADLs   Time 8   Period Weeks   Status New   PT LONG TERM GOAL #4   Title verbalize understanding of how to progress HEP for  continued strength gains   Time 8   Period Weeks   Status New               Plan - 05/14/14 1716    Clinical Impression Statement Pt with good compiance and will continue to benefit from PT, esspecially Pt will have major surgery for diverticulitis on June 2nd, 2016   Pt will benefit from skilled therapeutic intervention in order to improve on the following deficits Difficulty walking;Postural dysfunction;Decreased endurance;Decreased strength   Rehab Potential Good   PT Frequency 2x / week   PT Duration 8 weeks   PT Treatment/Interventions ADLs/Self Care Home Management;Therapeutic exercise;Balance training;Gait training;Neuromuscular re-education;Patient/family education   PT Next Visit Plan Continue strength and endurance training for UE and LE.  Continue arm bike.   Consulted and Agree with Plan of Care Patient        Problem List Patient Active Problem List   Diagnosis Date Noted  . Colonic diverticular abscess 04/09/2014  . BRBPR (bright red blood per rectum) 04/09/2014  . Sigmoid diverticulitis 01/14/2014  . Chronic atrial fibrillation 01/14/2014  . Chronic systolic congestive heart failure 01/14/2014  . Intra-abdominal abscess 01/14/2014  . Merkel cell carcinoma 06/30/2013  . Weight loss 06/30/2013  . Skin lesion 06/30/2013  . Atrial fibrillation 06/30/2013  . OSA (obstructive sleep apnea) 03/25/2013  . Chronic diastolic heart failure, NYHA class 3 04/11/2011  . DOE (dyspnea on exertion) 03/31/2011  . GERD 03/09/2009  . ABDOMINAL PAIN, GENERALIZED 03/09/2009  . DYSPEPSIA&OTHER [facility] DISORDERS FUNCTION STOMACH 02/08/2009  . PERSONAL HISTORY OF COLONIC POLYPS 02/08/2009  . LEG PAIN 01/04/2009  . MELANOMA OF SKIN, SITE UNSPECIFIED 06/03/2008  . SPINAL STENOSIS, LUMBAR 06/03/2008  . DYSLIPIDEMIA 05/23/2008  . OBESITY 05/23/2008  . Essential hypertension 05/23/2008  . CAD 05/23/2008    NAUMANN-HOUEGNIFIO,Avagail Whittlesey PTA 05/14/2014, 5:17 PM  [facility] [address] [place], [place], [address] Phone: [phone]   Fax:  [phone]

## 2014-05-19 ENCOUNTER — Other Ambulatory Visit: Payer: Self-pay | Admitting: Urology

## 2014-05-19 ENCOUNTER — Ambulatory Visit: Payer: Medicare Other | Attending: Family Medicine | Admitting: Physical Therapy

## 2014-05-19 ENCOUNTER — Encounter: Payer: Self-pay | Admitting: Physical Therapy

## 2014-05-19 DIAGNOSIS — I2510 Atherosclerotic heart disease of native coronary artery without angina pectoris: Secondary | ICD-10-CM | POA: Diagnosis not present

## 2014-05-19 DIAGNOSIS — I10 Essential (primary) hypertension: Secondary | ICD-10-CM | POA: Diagnosis not present

## 2014-05-19 DIAGNOSIS — E669 Obesity, unspecified: Secondary | ICD-10-CM | POA: Diagnosis not present

## 2014-05-19 DIAGNOSIS — I482 Chronic atrial fibrillation: Secondary | ICD-10-CM | POA: Diagnosis not present

## 2014-05-19 DIAGNOSIS — I255 Ischemic cardiomyopathy: Secondary | ICD-10-CM | POA: Diagnosis not present

## 2014-05-19 DIAGNOSIS — R531 Weakness: Secondary | ICD-10-CM | POA: Diagnosis present

## 2014-05-19 DIAGNOSIS — R262 Difficulty in walking, not elsewhere classified: Secondary | ICD-10-CM | POA: Insufficient documentation

## 2014-05-19 DIAGNOSIS — G4733 Obstructive sleep apnea (adult) (pediatric): Secondary | ICD-10-CM | POA: Insufficient documentation

## 2014-05-19 DIAGNOSIS — E785 Hyperlipidemia, unspecified: Secondary | ICD-10-CM | POA: Diagnosis not present

## 2014-05-19 DIAGNOSIS — Z96649 Presence of unspecified artificial hip joint: Secondary | ICD-10-CM | POA: Insufficient documentation

## 2014-05-19 DIAGNOSIS — R269 Unspecified abnormalities of gait and mobility: Secondary | ICD-10-CM

## 2014-05-19 NOTE — Therapy (Signed)
[facility] [address], [place], [place], [address] Phone: [phone]   Fax:  [phone]  Physical Therapy Treatment  Patient Details  Name: Harold Jennings MRN: [id number] Date of Birth: Jun 12, 1932 Referring Provider:  London Pepper, MD  Encounter Date: 05/19/2014      PT End of Session - 05/19/14 1706    Visit Number 7   Number of Visits 10   Date for PT Re-Evaluation 06/18/14   PT Start Time 3428   PT Stop Time 7681   PT Time Calculation (min) 45 min   Activity Tolerance Patient tolerated treatment well      Past Medical History  Diagnosis Date  . Dyslipidemia   . HTN (hypertension)   . Obesity   . CAD (coronary artery disease)     Silent MI sometime prior to 2000. S/P cath in 2000 showing a total mid RCA with collaterals from the LCX. EF is 40 to50%; Last Myoview in 2010 showing inferior scar without ischemia. EF was 52%.  . Increased liver enzymes     felt to be a fatty liver per ultrasound in 2011  . Dyspnea   . Ischemic cardiomyopathy   . Spinal stenosis     Past Surgical History  Procedure Laterality Date  . Total hip arthroplasty    . Eye surgery    . Lower back surgery      spinal stenosis  . Shoulder surgery    . Cardioversion N/A 06/06/2012    Procedure: CARDIOVERSION;  Surgeon: Thayer Headings, MD;  Location: [facility] ENDOSCOPY;  Service: Cardiovascular;  Laterality: N/A;    There were no vitals filed for this visit.  Visit Diagnosis:  Weakness generalized  Abnormality of gait      Subjective Assessment - 05/19/14 1541    Subjective Pt continues to feel tired and low endurance, his colon surgery is scheduled for Friday 27 of May   Pertinent History hospitalization for diverticulitis.  Pt will likely have a surgery in 6 weeks.     How long can you walk comfortably? 30 minutes max    Patient Stated Goals improve endurance and strength.  Improve strength to be stronger for surgery (6  weeks?)   Currently in Pain? No/denies   Multiple Pain Sites No                         [facility] Adult PT Treatment/Exercise - 05/19/14 0001    Bed Mobility   Bed Mobility --  pt wishes to be called [name]   Ambulation/Gait   Ambulation/Gait Yes   Ambulation/Gait Assistance 6: Modified independent (Device/Increase time)   Ambulation Distance (Feet) --  100 feet one round in the gym   Assistive device Straight cane   Gait Pattern Step-through pattern   Ambulation Surface Level   Gait velocity decreased gait velocity   Gait Comments --  performed 100 feet in 58sec   Knee/Hip Exercises: Aerobic   Stationary Bike Level 1 x 10 minutes  1.69mph   Knee/Hip Exercises: Standing   Forward Step Up Both  1 min alternating tapping up   Other Standing Knee Exercises sit to stand: x 72min able to perform 17, with ball in hand x 2   Other Standing Knee Exercises standing hip abduction and extension 2x10 bil. each  2.5# added   Knee/Hip Exercises: Seated   Long Arc Quad Strengthening;Both;20 reps;Weights  2.5#   Other Seated Knee Exercises seated  hip adduction against ball x 20,                   PT Short Term Goals - 05/19/14 1709    PT SHORT TERM GOAL #1   Title be independent in initial HEP   Time 4   Period Weeks   Status On-going   PT SHORT TERM GOAL #2   Title reduce TUG to < or = to 18 seconds to improve safety   Time 4   Period Weeks   Status On-going   PT SHORT TERM GOAL #3   Title improve LE strength to perform sit to stand with moderate UE support   Time 4   Period Weeks   Status On-going           PT Long Term Goals - 05/19/14 1709    PT LONG TERM GOAL #1   Title be independent in advanced HEP   Time 8   Period Weeks   Status On-going   PT LONG TERM GOAL #2   Title perform TUG in < or = to 16 seconds   Time 8   Period Weeks   Status On-going   PT LONG TERM GOAL #3   Title demonstrate 4+/5 UE strength to improve use with ADLs   Time  8   Period Weeks   Status On-going   PT LONG TERM GOAL #4   Title verbalize understanding of how to progress HEP for continued strength gains   Time 8   Period Weeks   Status On-going               Plan - 05/19/14 1707    Clinical Impression Statement Pt will continue to benfit from skilled PT to gain with strength, andurance improve gait   Rehab Potential Good   PT Frequency 2x / week   PT Duration 8 weeks   PT Treatment/Interventions ADLs/Self Care Home Management;Therapeutic exercise;Balance training;Gait training;Neuromuscular re-education;Patient/family education   PT Next Visit Plan Continue strength and endurance training for UE and LE.  Continue arm bike.   Consulted and Agree with Plan of Care Patient        Problem List Patient Active Problem List   Diagnosis Date Noted  . Colonic diverticular abscess 04/09/2014  . BRBPR (bright red blood per rectum) 04/09/2014  . Sigmoid diverticulitis 01/14/2014  . Chronic atrial fibrillation 01/14/2014  . Chronic systolic congestive heart failure 01/14/2014  . Intra-abdominal abscess 01/14/2014  . Merkel cell carcinoma 06/30/2013  . Weight loss 06/30/2013  . Skin lesion 06/30/2013  . Atrial fibrillation 06/30/2013  . OSA (obstructive sleep apnea) 03/25/2013  . Chronic diastolic heart failure, NYHA class 3 04/11/2011  . DOE (dyspnea on exertion) 03/31/2011  . GERD 03/09/2009  . ABDOMINAL PAIN, GENERALIZED 03/09/2009  . DYSPEPSIA&OTHER [facility] DISORDERS FUNCTION STOMACH 02/08/2009  . PERSONAL HISTORY OF COLONIC POLYPS 02/08/2009  . LEG PAIN 01/04/2009  . MELANOMA OF SKIN, SITE UNSPECIFIED 06/03/2008  . SPINAL STENOSIS, LUMBAR 06/03/2008  . DYSLIPIDEMIA 05/23/2008  . OBESITY 05/23/2008  . Essential hypertension 05/23/2008  . CAD 05/23/2008    NAUMANN-HOUEGNIFIO,Adalynn Corne PTA 05/19/2014, 5:11 PM  [facility] [address] [place], [place], [address] Phone:  [phone]   Fax:  [phone]

## 2014-05-20 ENCOUNTER — Ambulatory Visit (INDEPENDENT_AMBULATORY_CARE_PROVIDER_SITE_OTHER): Payer: Medicare Other | Admitting: Pulmonary Disease

## 2014-05-20 ENCOUNTER — Encounter: Payer: Self-pay | Admitting: Pulmonary Disease

## 2014-05-20 VITALS — BP 122/82 | HR 102 | Ht 65.5 in | Wt 201.8 lb

## 2014-05-20 DIAGNOSIS — G4733 Obstructive sleep apnea (adult) (pediatric): Secondary | ICD-10-CM | POA: Diagnosis not present

## 2014-05-20 NOTE — Progress Notes (Signed)
   Subjective:    Patient ID: Harold Jennings, male    DOB: 08-09-1932, 81 y.o.   MRN: [id number]  HPI  81 year old retired [employer] with coronary artery disease, spinal stenosis and hypertension presents for FU of obstructive sleep apnea.   Significant tests/ events  PSG in [place], [place] in 1991, maintained on nasal CPAP since.  CXR - Stable nodular density in the right upper lobe since 2010  Home study showed severe OSA + central apneas  Titration 06/2013 -235 pounds with a height of 5 ft 5 inches and the BMI of 39, neck size of 18.5 inches mixed and central apneas persisted on CPAP. Hence BiPAP was required  Events corrected by BiPAP at 20/16 centimeters with a medium fullface mask - target 22/18   09/04/2013 Download shows-good compliance, average more than 6 hours, no residual events, AHI 4 per hour, small leak   11/04/2013 Changed bipap to auto  Per airview download 8/24-9/22/15:  Usage days 30/30 days (100%)  >= 4 hours 21 days (70%)   AHI: 8.3   ---  Mode VAuto  Max IPAP 20 cmH2O  Min EPAP 12 cmH2O  Pressure Support 4 cmH2O    05/20/2014  Chief Complaint  Patient presents with  . Sleep Apnea    Patient is not using CPAP machine.  Patient said he was having too much trouble with it leaking and it was keeping him awake more than sleep.      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 He stopped using last 4 wks Has lost >30 lbs , down to 201   Review of Systems neg for any significant sore throat, dysphagia, itching, sneezing, nasal congestion or excess/ purulent secretions, fever, chills, sweats, unintended wt loss, pleuritic or exertional cp, hempoptysis, orthopnea pnd or change in chronic leg swelling. Also denies presyncope, palpitations, heartburn, abdominal pain, nausea, vomiting, diarrhea or change in  bowel or urinary habits, dysuria,hematuria, rash, arthralgias, visual complaints, headache, numbness weakness or ataxia.     Objective:   Physical Exam  Gen. Pleasant, obese, in no distress, normal affect ENT - no lesions, no post nasal drip, class 2-3 airway Neck: No JVD, no thyromegaly, no carotid bruits Lungs: no use of accessory muscles, no dullness to percussion, decreased without rales or rhonchi  Cardiovascular: Rhythm regular, heart sounds  normal, no murmurs or gallops, no peripheral edema Abdomen: soft and non-tender, no hepatosplenomegaly, BS normal. Musculoskeletal: No deformities, no cyanosis or clubbing Neuro:  alert, non focal, no tremors        Assessment & Plan:

## 2014-05-20 NOTE — Patient Instructions (Signed)
We will drop pressure on bipap machine & check report in 2 weeks Get back on bipap Good luck with surgery - let anesthesia know about sleep apnea

## 2014-05-20 NOTE — Assessment & Plan Note (Signed)
He has lost sig wt & lower pressure may be ok We will drop pressure on bipap machine & check report in 2 weeks Get back on bipap Good luck with surgery - let anesthesia know about sleep apnea Eventually, will recheck home sleep test post op once wt steady

## 2014-05-21 ENCOUNTER — Ambulatory Visit: Payer: Medicare Other

## 2014-05-21 DIAGNOSIS — R269 Unspecified abnormalities of gait and mobility: Secondary | ICD-10-CM

## 2014-05-21 DIAGNOSIS — R531 Weakness: Secondary | ICD-10-CM | POA: Diagnosis not present

## 2014-05-21 NOTE — Therapy (Signed)
[facility] [address], [place], [place], [address] Phone: [phone]   Fax:  [phone]  Physical Therapy Treatment  Patient Details  Name: Harold Jennings MRN: [id number] Date of Birth: 1932/02/22 Referring Provider:  London Pepper, MD  Encounter Date: 05/21/2014      PT End of Session - 05/21/14 1609    Visit Number 8   Number of Visits 10  Medicare   Date for PT Re-Evaluation 06/18/14   PT Start Time 1532   PT Stop Time 5176   PT Time Calculation (min) 43 min   Activity Tolerance Patient tolerated treatment well   Behavior During Therapy [facility] for tasks assessed/performed      Past Medical History  Diagnosis Date  . Dyslipidemia   . HTN (hypertension)   . Obesity   . CAD (coronary artery disease)     Silent MI sometime prior to 2000. S/P cath in 2000 showing a total mid RCA with collaterals from the LCX. EF is 40 to50%; Last Myoview in 2010 showing inferior scar without ischemia. EF was 52%.  . Increased liver enzymes     felt to be a fatty liver per ultrasound in 2011  . Dyspnea   . Ischemic cardiomyopathy   . Spinal stenosis     Past Surgical History  Procedure Laterality Date  . Total hip arthroplasty    . Eye surgery    . Lower back surgery      spinal stenosis  . Shoulder surgery    . Cardioversion N/A 06/06/2012    Procedure: CARDIOVERSION;  Surgeon: Thayer Headings, MD;  Location: [facility] ENDOSCOPY;  Service: Cardiovascular;  Laterality: N/A;    There were no vitals filed for this visit.  Visit Diagnosis:  Weakness generalized  Abnormality of gait      Subjective Assessment - 05/21/14 1536    Subjective Pt feels tired today due to the weather.     Currently in Pain? No/denies                         [facility] Adult PT Treatment/Exercise - 05/21/14 0001    Knee/Hip Exercises: Aerobic   Stationary Bike Level 1 x 10 minutes  1.7 miles   Knee/Hip Exercises: Standing   Forward Step Up Both  2x1 min alternating tapping up   Rebounder mini tramp: 3 ways x 1 minute each   Knee/Hip Exercises: Seated   Long Arc Quad Strengthening;Both;20 reps;Weights  2.5#   Other Seated Knee Exercises seated hip adduction against ball x 20,    Shoulder Exercises: Seated   Other Seated Exercises 3 way raises with 1#   2 x 10   Shoulder Exercises: ROM/Strengthening   UBE (Upper Arm Bike) L1 62min (3/3)                  PT Short Term Goals - 05/19/14 1709    PT SHORT TERM GOAL #1   Title be independent in initial HEP   Time 4   Period Weeks   Status On-going   PT SHORT TERM GOAL #2   Title reduce TUG to < or = to 18 seconds to improve safety   Time 4   Period Weeks   Status On-going   PT SHORT TERM GOAL #3   Title improve LE strength to perform sit to stand with moderate UE support   Time 4   Period Weeks   Status On-going  PT Long Term Goals - 05/19/14 1709    PT LONG TERM GOAL #1   Title be independent in advanced HEP   Time 8   Period Weeks   Status On-going   PT LONG TERM GOAL #2   Title perform TUG in < or = to 16 seconds   Time 8   Period Weeks   Status On-going   PT LONG TERM GOAL #3   Title demonstrate 4+/5 UE strength to improve use with ADLs   Time 8   Period Weeks   Status On-going   PT LONG TERM GOAL #4   Title verbalize understanding of how to progress HEP for continued strength gains   Time 8   Period Weeks   Status On-going               Plan - 05/21/14 1544    Clinical Impression Statement Pt with continued strength and endurance deficits after hospital stay.  Pt will attend PT prior to surgery to improve endurance and strength.  Pt with limited compliance with HEP.     Pt will benefit from skilled therapeutic intervention in order to improve on the following deficits Difficulty walking;Postural dysfunction;Decreased endurance;Decreased strength   Rehab Potential Good   PT Frequency 2x / week   PT  Duration 8 weeks   PT Treatment/Interventions ADLs/Self Care Home Management;Therapeutic exercise;Balance training;Gait training;Neuromuscular re-education;Patient/family education   PT Next Visit Plan Continue strength and endurance training for UE and LE.  Continue arm bike.  G-codes next week.   Consulted and Agree with Plan of Care Patient        Problem List Patient Active Problem List   Diagnosis Date Noted  . Colonic diverticular abscess 04/09/2014  . BRBPR (bright red blood per rectum) 04/09/2014  . Sigmoid diverticulitis 01/14/2014  . Chronic atrial fibrillation 01/14/2014  . Chronic systolic congestive heart failure 01/14/2014  . Intra-abdominal abscess 01/14/2014  . Merkel cell carcinoma 06/30/2013  . Weight loss 06/30/2013  . Skin lesion 06/30/2013  . Atrial fibrillation 06/30/2013  . OSA (obstructive sleep apnea) 03/25/2013  . Chronic diastolic heart failure, NYHA class 3 04/11/2011  . DOE (dyspnea on exertion) 03/31/2011  . GERD 03/09/2009  . ABDOMINAL PAIN, GENERALIZED 03/09/2009  . DYSPEPSIA&OTHER [facility] DISORDERS FUNCTION STOMACH 02/08/2009  . PERSONAL HISTORY OF COLONIC POLYPS 02/08/2009  . LEG PAIN 01/04/2009  . MELANOMA OF SKIN, SITE UNSPECIFIED 06/03/2008  . SPINAL STENOSIS, LUMBAR 06/03/2008  . DYSLIPIDEMIA 05/23/2008  . OBESITY 05/23/2008  . Essential hypertension 05/23/2008  . CAD 05/23/2008    Geet Hosking, PT 05/21/2014, 4:10 PM  [facility] [address], [place], [place], [address] Phone: [phone]   Fax:  [phone]

## 2014-05-26 ENCOUNTER — Ambulatory Visit: Payer: Medicare Other | Admitting: Physical Therapy

## 2014-05-26 ENCOUNTER — Encounter: Payer: Self-pay | Admitting: Physical Therapy

## 2014-05-26 DIAGNOSIS — R531 Weakness: Secondary | ICD-10-CM | POA: Diagnosis not present

## 2014-05-26 DIAGNOSIS — R269 Unspecified abnormalities of gait and mobility: Secondary | ICD-10-CM

## 2014-05-26 NOTE — Therapy (Signed)
[facility] [address] [place], [place], [address] Phone: [phone]   Fax:  [phone]  Physical Therapy Treatment  Patient Details  Name: Harold Jennings MRN: [id number] Date of Birth: 12/17/1932 Referring Provider:  London Pepper, MD  Encounter Date: 05/26/2014      PT End of Session - 05/26/14 1708    Visit Number 9   Number of Visits 10   Date for PT Re-Evaluation 06/18/14   PT Start Time 9767   PT Stop Time 1618   PT Time Calculation (min) 49 min   Activity Tolerance Patient tolerated treatment well   Behavior During Therapy [facility] for tasks assessed/performed      Past Medical History  Diagnosis Date  . Dyslipidemia   . HTN (hypertension)   . Obesity   . CAD (coronary artery disease)     Silent MI sometime prior to 2000. S/P cath in 2000 showing a total mid RCA with collaterals from the LCX. EF is 40 to50%; Last Myoview in 2010 showing inferior scar without ischemia. EF was 52%.  . Increased liver enzymes     felt to be a fatty liver per ultrasound in 2011  . Dyspnea   . Ischemic cardiomyopathy   . Spinal stenosis     Past Surgical History  Procedure Laterality Date  . Total hip arthroplasty    . Eye surgery    . Lower back surgery      spinal stenosis  . Shoulder surgery    . Cardioversion N/A 06/06/2012    Procedure: CARDIOVERSION;  Surgeon: Thayer Headings, MD;  Location: [facility] ENDOSCOPY;  Service: Cardiovascular;  Laterality: N/A;    There were no vitals filed for this visit.  Visit Diagnosis:  Weakness generalized  Abnormality of gait      Subjective Assessment - 05/26/14 1552    Subjective I have no energy, I feel fatique   Pertinent History hospitalization for diverticulitis.  Pt will likely have a surgery in 6 weeks.     Patient Stated Goals improve strength and endurance to be stronger for surgery (Diverticulitis) on Thursday the 26 of May, 2016   Currently in Pain? --    Multiple Pain Sites --                         [facility] Adult PT Treatment/Exercise - 05/26/14 0001    Knee/Hip Exercises: Aerobic   Stationary Bike Level 1 x 10 minutes  1.43mpH   Knee/Hip Exercises: Standing   Forward Step Up Both;10 reps;Hand Hold: 2;Step Height: 6";Other (comment)  alternating tapping up 2x1 min using one UE    Rebounder mini tramp: 3 ways x 1 minute each   Other Standing Knee Exercises sit to stand: x 49min able to perform 15, with ball in hand x 2   Knee/Hip Exercises: Seated   Long Arc Quad Strengthening;Both;20 reps;Weights   Other Seated Knee Exercises seated hip adduction against ball x 20,    Shoulder Exercises: Seated   Other Seated Exercises 3 way raises with 1# 2 x10   Shoulder Exercises: ROM/Strengthening   UBE (Upper Arm Bike) L1 22min (3/3)                  PT Short Term Goals - 05/26/14 1710    PT SHORT TERM GOAL #1   Title be independent in initial HEP   Time 4   Period Weeks   Status On-going  PT SHORT TERM GOAL #2   Title reduce TUG to < or = to 18 seconds to improve safety   Time 4   Period Weeks   Status On-going   PT SHORT TERM GOAL #3   Title improve LE strength to perform sit to stand with moderate UE support   Time 4   Period Weeks   Status On-going           PT Long Term Goals - 05/26/14 1711    PT LONG TERM GOAL #1   Title be independent in advanced HEP   Time 8   Period Weeks   Status On-going   PT LONG TERM GOAL #2   Title perform TUG in < or = to 16 seconds   Time 8   Period Weeks   Status On-going   PT LONG TERM GOAL #3   Title demonstrate 4+/5 UE strength to improve use with ADLs   Time 8   Period Weeks   Status On-going   PT LONG TERM GOAL #4   Title verbalize understanding of how to progress HEP for continued strength gains   Time 8   Period Weeks   Status On-going               Plan - 05/26/14 1708    Clinical Impression Statement Pt with continued strength and  endurance deficits after hospital stay and before surgery scheduled for Thursday Jun 11, 2014    Pt will benefit from skilled therapeutic intervention in order to improve on the following deficits Difficulty walking;Postural dysfunction;Decreased endurance;Decreased strength   Rehab Potential Good   PT Frequency 2x / week   PT Duration 8 weeks   PT Treatment/Interventions ADLs/Self Care Home Management;Therapeutic exercise;Balance training;Gait training;Neuromuscular re-education;Patient/family education   PT Next Visit Plan Continue strength and endurance training for UE and LE.  Continue arm bike.  G-codes next week.   Consulted and Agree with Plan of Care Patient        Problem List Patient Active Problem List   Diagnosis Date Noted  . Colonic diverticular abscess 04/09/2014  . BRBPR (bright red blood per rectum) 04/09/2014  . Sigmoid diverticulitis 01/14/2014  . Chronic atrial fibrillation 01/14/2014  . Chronic systolic congestive heart failure 01/14/2014  . Intra-abdominal abscess 01/14/2014  . Merkel cell carcinoma 06/30/2013  . Weight loss 06/30/2013  . Skin lesion 06/30/2013  . Atrial fibrillation 06/30/2013  . OSA (obstructive sleep apnea) 03/25/2013  . Chronic diastolic heart failure, NYHA class 3 04/11/2011  . DOE (dyspnea on exertion) 03/31/2011  . GERD 03/09/2009  . ABDOMINAL PAIN, GENERALIZED 03/09/2009  . DYSPEPSIA&OTHER [facility] DISORDERS FUNCTION STOMACH 02/08/2009  . PERSONAL HISTORY OF COLONIC POLYPS 02/08/2009  . LEG PAIN 01/04/2009  . MELANOMA OF SKIN, SITE UNSPECIFIED 06/03/2008  . SPINAL STENOSIS, LUMBAR 06/03/2008  . DYSLIPIDEMIA 05/23/2008  . OBESITY 05/23/2008  . Essential hypertension 05/23/2008  . CAD 05/23/2008    NAUMANN-HOUEGNIFIO,Tynell Winchell PTA 05/26/2014, 5:17 PM  [facility] [address] [place], [place], [address] Phone: [phone]   Fax:  [phone]

## 2014-05-28 ENCOUNTER — Ambulatory Visit: Payer: Medicare Other

## 2014-05-28 DIAGNOSIS — R269 Unspecified abnormalities of gait and mobility: Secondary | ICD-10-CM

## 2014-05-28 DIAGNOSIS — R531 Weakness: Secondary | ICD-10-CM | POA: Diagnosis not present

## 2014-05-28 NOTE — Therapy (Signed)
[facility] [address], [place], [place], [address] Phone: [phone]   Fax:  [phone]  Physical Therapy Treatment  Patient Details  Name: Harold Jennings MRN: [id number] Date of Birth: 1932-04-25 Referring Provider:  London Pepper, MD  Encounter Date: 05/28/2014      PT End of Session - 05/28/14 1615    Visit Number 10   Number of Visits 10   Date for PT Re-Evaluation 06/18/14   PT Start Time 1532   PT Stop Time 1616   PT Time Calculation (min) 44 min      Past Medical History  Diagnosis Date  . Dyslipidemia   . HTN (hypertension)   . Obesity   . CAD (coronary artery disease)     Silent MI sometime prior to 2000. S/P cath in 2000 showing a total mid RCA with collaterals from the LCX. EF is 40 to50%; Last Myoview in 2010 showing inferior scar without ischemia. EF was 52%.  . Increased liver enzymes     felt to be a fatty liver per ultrasound in 2011  . Dyspnea   . Ischemic cardiomyopathy   . Spinal stenosis     Past Surgical History  Procedure Laterality Date  . Total hip arthroplasty    . Eye surgery    . Lower back surgery      spinal stenosis  . Shoulder surgery    . Cardioversion N/A 06/06/2012    Procedure: CARDIOVERSION;  Surgeon: Thayer Headings, MD;  Location: [facility] ENDOSCOPY;  Service: Cardiovascular;  Laterality: N/A;    There were no vitals filed for this visit.  Visit Diagnosis:  Weakness generalized  Abnormality of gait      Subjective Assessment - 05/28/14 1534    Subjective States that he is feeling "fair" currently.    How long can you walk comfortably? Able to ambulate for 30 minutes before needing a rest break   Currently in Pain? No/denies            [facility] PT Assessment - 05/28/14 0001    Timed Up and Go Test   TUG Normal TUG   Normal TUG (seconds) 21  25 seconds for 2nd trial                     [facility] Adult PT Treatment/Exercise - 05/28/14 0001     Knee/Hip Exercises: Aerobic   Stationary Bike Level 1 x 10 minutes   Knee/Hip Exercises: Standing   Forward Step Up Both;10 reps;Hand Hold: 2;Step Height: 6";Other (comment)   Rebounder mini tramp: 3 ways x 1 minute each   Other Standing Knee Exercises sit to stand with ball in hand;  2x10   Knee/Hip Exercises: Seated   Long Arc Quad Strengthening;Both;3 sets  2.5#   Other Seated Knee Exercises seated hip adduction against ball x 20,    Shoulder Exercises: Seated   Other Seated Exercises 3 way raises with 1# 2 x10   Shoulder Exercises: ROM/Strengthening   UBE (Upper Arm Bike) L1 28min (3/3)                  PT Short Term Goals - 05/26/14 1710    PT SHORT TERM GOAL #1   Title be independent in initial HEP   Time 4   Period Weeks   Status On-going   PT SHORT TERM GOAL #2   Title reduce TUG to < or = to 18 seconds to improve safety  Time 4   Period Weeks   Status On-going   PT SHORT TERM GOAL #3   Title improve LE strength to perform sit to stand with moderate UE support   Time 4   Period Weeks   Status On-going           PT Long Term Goals - 05/26/14 1711    PT LONG TERM GOAL #1   Title be independent in advanced HEP   Time 8   Period Weeks   Status On-going   PT LONG TERM GOAL #2   Title perform TUG in < or = to 16 seconds   Time 8   Period Weeks   Status On-going   PT LONG TERM GOAL #3   Title demonstrate 4+/5 UE strength to improve use with ADLs   Time 8   Period Weeks   Status On-going   PT LONG TERM GOAL #4   Title verbalize understanding of how to progress HEP for continued strength gains   Time 8   Period Weeks   Status On-going               Plan - 2014/05/29 1616    Clinical Impression Statement Pt states that he continues to feel "fair." Able to tolerate strength/endurance exericses well with frequent rest breaks. TUG score 21 s indicating high falls risk. Pt will benefit from skilled PT for continued strength, endurance and  balance training.   Rehab Potential Good   PT Frequency 2x / week   PT Duration 8 weeks   PT Treatment/Interventions ADLs/Self Care Home Management;Patient/family education;Therapeutic exercise;Balance training;Functional mobility training   PT Next Visit Plan Continue with endurance activities (bike for 10 min, arm bike for 3 minutes, ambulation with cane)   Consulted and Agree with Plan of Care Patient          G-Codes - 05/29/14 1554    Functional Assessment Tool Used TUG: 21/25 seconds, clincial judgement   Functional Limitation Mobility: Walking and moving around   Mobility: Walking and Moving Around Current Status [phone]) At least 40 percent but less than 60 percent impaired, limited or restricted   Mobility: Walking and Moving Around Goal Status [phone]) At least 40 percent but less than 60 percent impaired, limited or restricted      Problem List Patient Active Problem List   Diagnosis Date Noted  . Colonic diverticular abscess 04/09/2014  . BRBPR (bright red blood per rectum) 04/09/2014  . Sigmoid diverticulitis 01/14/2014  . Chronic atrial fibrillation 01/14/2014  . Chronic systolic congestive heart failure 01/14/2014  . Intra-abdominal abscess 01/14/2014  . Merkel cell carcinoma 06/30/2013  . Weight loss 06/30/2013  . Skin lesion 06/30/2013  . Atrial fibrillation 06/30/2013  . OSA (obstructive sleep apnea) 03/25/2013  . Chronic diastolic heart failure, NYHA class 3 04/11/2011  . DOE (dyspnea on exertion) 03/31/2011  . GERD 03/09/2009  . ABDOMINAL PAIN, GENERALIZED 03/09/2009  . DYSPEPSIA&OTHER [facility] DISORDERS FUNCTION STOMACH 02/08/2009  . PERSONAL HISTORY OF COLONIC POLYPS 02/08/2009  . LEG PAIN 01/04/2009  . MELANOMA OF SKIN, SITE UNSPECIFIED 06/03/2008  . SPINAL STENOSIS, LUMBAR 06/03/2008  . DYSLIPIDEMIA 05/23/2008  . OBESITY 05/23/2008  . Essential hypertension 05/23/2008  . CAD 05/23/2008    Reginal Lutes, SPT  05-29-2014, 4:36 PM Sigurd Sos,  PT 05-29-14 4:36 PM  [facility] [address] [place], [place], [address] Phone: [phone]   Fax:  [phone]

## 2014-06-08 ENCOUNTER — Other Ambulatory Visit: Payer: Self-pay | Admitting: Urology

## 2014-06-08 NOTE — Progress Notes (Signed)
Called for orders surgery 06-12-14 pre op 06-10-14 Thanks

## 2014-06-09 ENCOUNTER — Ambulatory Visit: Payer: Medicare Other

## 2014-06-09 DIAGNOSIS — R531 Weakness: Secondary | ICD-10-CM

## 2014-06-09 NOTE — Therapy (Addendum)
[facility] [address] [name], [place], [address] Phone: [phone]   Fax:  [phone]  Physical Therapy Treatment  Patient Details  Name: Harold Jennings MRN: [id number] Date of Birth: 1932/11/29 Referring Provider:  London Pepper, MD  Encounter Date: 06/09/2014      PT End of Session - 06/09/14 1547    Visit Number 11   Number of Visits 10   Date for PT Re-Evaluation 06/18/14   PT Start Time 4163   PT Stop Time 1537   PT Time Calculation (min) 46 min   Activity Tolerance Patient tolerated treatment well   Behavior During Therapy [facility] for tasks assessed/performed      Past Medical History  Diagnosis Date  . Dyslipidemia   . HTN (hypertension)   . Obesity   . CAD (coronary artery disease)     Silent MI sometime prior to 2000. S/P cath in 2000 showing a total mid RCA with collaterals from the LCX. EF is 40 to50%; Last Myoview in 2010 showing inferior scar without ischemia. EF was 52%.  . Increased liver enzymes     felt to be a fatty liver per ultrasound in 2011  . Dyspnea   . Ischemic cardiomyopathy   . Spinal stenosis     Past Surgical History  Procedure Laterality Date  . Total hip arthroplasty    . Eye surgery    . Lower back surgery      spinal stenosis  . Shoulder surgery    . Cardioversion N/A 06/06/2012    Procedure: CARDIOVERSION;  Surgeon: Thayer Headings, MD;  Location: [facility] ENDOSCOPY;  Service: Cardiovascular;  Laterality: N/A;    There were no vitals filed for this visit.  Visit Diagnosis:  Weakness generalized      Subjective Assessment - 06/09/14 1456    Subjective States that he is feeling "fair" and "weak" today. Will be going into surgery this Friday.    Currently in Pain? No/denies            [facility] PT Assessment - 06/09/14 0001    ROM / Strength   AROM / PROM / Strength Strength   Strength   Overall Strength Within functional limits for tasks performed   Overall  Strength Comments 4/5 for Lt hip flexion, knee extension; 4/5 for Rt hip flexion  5/5 for Bil UE strength; 5/5 for all other LE motions    Standardized Balance Assessment   Standardized Balance Assessment Timed Up and Go Test   Timed Up and Go Test   TUG Normal TUG   Normal TUG (seconds) 15  1st trial: 21 seconds                     [facility] Adult PT Treatment/Exercise - 06/09/14 0001    Knee/Hip Exercises: Aerobic   Stationary Bike Level 1 x 10 minutes   Knee/Hip Exercises: Standing   Forward Step Up --   Rebounder --   Other Standing Knee Exercises sit to stand with ball in hand; 1x10   Knee/Hip Exercises: Seated   Long Arc Quad Strengthening;Both;3 sets  2.5#   Other Seated Knee Exercises seated hip adduction against ball x 20,    Shoulder Exercises: ROM/Strengthening   UBE (Upper Arm Bike) L1 35mn (3/3)                  PT Short Term Goals - 06/09/14 1501    PT SHORT TERM GOAL #1  Title Be independent in initial HEP   Pt is noncompliant with HEP at home due to lack of energy   Time 4   Period Weeks   Status Not Met   PT SHORT TERM GOAL #3   Title improve LE strength to perform sit to stand with moderate UE support  Able to perform sit to stand x10 without UE support and VC in clinic; pt may use UE support in daily life out of habit   Time 4   Period Weeks   Status Achieved           PT Long Term Goals - Jun 15, 2014 1543    PT LONG TERM GOAL #1   Title be independent in advanced HEP  Pt noncompliant with HEP due to lack of energy   Time 8   Period Weeks   Status Not Met   PT LONG TERM GOAL #2   Title perform TUG in < or = to 16 seconds  Completed TUG in 15 seconds without SPC   Time 8   Period Weeks   Status Achieved   PT LONG TERM GOAL #3   Title demonstrate 4+/5 UE strength to improve use with ADLs  5/5 for Bil UE strength    Time 8   Period Weeks   Status Achieved   PT LONG TERM GOAL #4   Title verbalize understanding of how to  progress HEP for continued strength gains  Pt understands how to progress HEP; currently noncompliant with program   Time 8   Period Weeks   Status Not Met               Plan - 15-Jun-2014 1553    Clinical Impression Statement Pt has improved LE/UE strength for daily functional activites, demonstrates increased endurance for ambulation and has decreased falls risk (TUG score= 15 s) after therapy episode.    Pt will benefit from skilled therapeutic intervention in order to improve on the following deficits Difficulty walking;Postural dysfunction;Decreased endurance;Decreased strength   Rehab Potential Good   PT Frequency 2x / week   PT Duration 8 weeks   PT Treatment/Interventions ADLs/Self Care Home Management;Patient/family education;Therapeutic exercise;Balance training;Functional mobility training   PT Next Visit Plan Pt will D/C today; surgery on Friday    Consulted and Agree with Plan of Care Patient          G-Codes - 06-15-14 1514    Functional Assessment Tool Used TUG: 15 seconds, clincial judgement   Functional Limitation Mobility: Walking and moving around   Mobility: Walking and Moving Around Goal Status [phone]) At least 40 percent but less than 60 percent impaired, limited or restricted   Mobility: Walking and Moving Around Discharge Status [phone]) At least 40 percent but less than 60 percent impaired, limited or restricted      Problem List Patient Active Problem List   Diagnosis Date Noted  . Colonic diverticular abscess 04/09/2014  . BRBPR (bright red blood per rectum) 04/09/2014  . Sigmoid diverticulitis 01/14/2014  . Chronic atrial fibrillation 01/14/2014  . Chronic systolic congestive heart failure 01/14/2014  . Intra-abdominal abscess 01/14/2014  . Merkel cell carcinoma 06/30/2013  . Weight loss 06/30/2013  . Skin lesion 06/30/2013  . Atrial fibrillation 06/30/2013  . OSA (obstructive sleep apnea) 03/25/2013  . Chronic diastolic heart failure, NYHA  class 3 04/11/2011  . DOE (dyspnea on exertion) 03/31/2011  . GERD 03/09/2009  . ABDOMINAL PAIN, GENERALIZED 03/09/2009  . DYSPEPSIA&OTHER [facility] DISORDERS FUNCTION STOMACH 02/08/2009  .  PERSONAL HISTORY OF COLONIC POLYPS 02/08/2009  . LEG PAIN 01/04/2009  . MELANOMA OF SKIN, SITE UNSPECIFIED 06/03/2008  . SPINAL STENOSIS, LUMBAR 06/03/2008  . DYSLIPIDEMIA 05/23/2008  . OBESITY 05/23/2008  . Essential hypertension 05/23/2008  . CAD 05/23/2008    Reginal Lutes, SPT 06/09/2014 4:02 PM PHYSICAL THERAPY DISCHARGE SUMMARY  Visits from Start of Care: 11  Current functional level related to goals / functional outcomes: See goals for status   Remaining deficits: Pt with continued endurance and strength deficits due to chronic condition.  Pt will have surgery this week and will be discharged to HEP   Education / Equipment: HEP Plan: Patient agrees to discharge.  Patient goals were partially met. Patient is being discharged due to the patient's request.  ?????    Sigurd Sos, PT 06/09/2014 4:31 PM   [facility] [address] [place], [place], [address] Phone: [phone]   Fax:  [phone]

## 2014-06-09 NOTE — Patient Instructions (Addendum)
PASCHAL BLANTON  06/09/2014   Your procedure is scheduled on:   06/12/2014    Report to [facility] Main  Entrance and follow signs to               [place] at       [place].  Call this number if you have problems the morning of surgery [phone]   Remember: ONLY 1 PERSON MAY GO WITH YOU TO SHORT STAY TO GET  READY MORNING OF [place].  Do not eat food or drink liquids :After Midnight.     Take these medicines the morning of surgery with A SIP OF WATER:   Synthroid, Toprol ( Metoprolol)                                You may not have any metal on your body including hair pins and              piercings  Do not wear jewelry,  lotions, powders or perfumes, deodorant            .              Men may shave face and neck.   Do not bring valuables to the hospital. [place].  Contacts, dentures or bridgework may not be worn into surgery.  Leave suitcase in the car. After surgery it may be brought to your room.       Special Instructions: coughing and deep breathing exercises, leg exercises               Please read over the following fact sheets you were given: _____________________________________________________________________             [facility] - Preparing for Surgery Before surgery, you can play an important role.  Because skin is not sterile, your skin needs to be as free of germs as possible.  You can reduce the number of germs on your skin by washing with CHG (chlorahexidine gluconate) soap before surgery.  CHG is an antiseptic cleaner which kills germs and bonds with the skin to continue killing germs even after washing. Please DO NOT use if you have an allergy to CHG or antibacterial soaps.  If your skin becomes reddened/irritated stop using the CHG and inform your nurse when you arrive at Short Stay. Do not shave (including legs and underarms) for at least 48 hours prior to the  first CHG shower.  You may shave your face/neck. Please follow these instructions carefully:  1.  Shower with CHG Soap the night before surgery and the  morning of Surgery.  2.  If you choose to wash your hair, wash your hair first as usual with your  normal  shampoo.  3.  After you shampoo, rinse your hair and body thoroughly to remove the  shampoo.                           4.  Use CHG as you would any other liquid soap.  You can apply chg directly  to the skin and wash  Gently with a scrungie or clean washcloth.  5.  Apply the CHG Soap to your body ONLY FROM THE NECK DOWN.   Do not use on face/ open                           Wound or open sores. Avoid contact with eyes, ears mouth and genitals (private parts).                       Wash face,  Genitals (private parts) with your normal soap.             6.  Wash thoroughly, paying special attention to the area where your surgery  will be performed.  7.  Thoroughly rinse your body with warm water from the neck down.  8.  DO NOT shower/wash with your normal soap after using and rinsing off  the CHG Soap.                9.  Pat yourself dry with a clean towel.            10.  Wear clean pajamas.            11.  Place clean sheets on your bed the night of your first shower and do not  sleep with pets. Day of Surgery : Do not apply any lotions/deodorants the morning of surgery.  Please wear clean clothes to the hospital/surgery center.  FAILURE TO FOLLOW THESE INSTRUCTIONS MAY RESULT IN THE CANCELLATION OF YOUR SURGERY PATIENT SIGNATURE_________________________________  NURSE SIGNATURE__________________________________  ________________________________________________________________________  WHAT IS A BLOOD TRANSFUSION? Blood Transfusion Information  A transfusion is the replacement of blood or some of its parts. Blood is made up of multiple cells which provide different functions.  Red blood cells carry oxygen and are  used for blood loss replacement.  White blood cells fight against infection.  Platelets control bleeding.  Plasma helps clot blood.  Other blood products are available for specialized needs, such as hemophilia or other clotting disorders. BEFORE THE TRANSFUSION  Who gives blood for transfusions?   Healthy volunteers who are fully evaluated to make sure their blood is safe. This is blood bank blood. Transfusion therapy is the safest it has ever been in the practice of medicine. Before blood is taken from a donor, a complete history is taken to make sure that person has no history of diseases nor engages in risky social behavior (examples are intravenous drug use or sexual activity with multiple partners). The donor's travel history is screened to minimize risk of transmitting infections, such as malaria. The donated blood is tested for signs of infectious diseases, such as HIV and hepatitis. The blood is then tested to be sure it is compatible with you in order to minimize the chance of a transfusion reaction. If you or a relative donates blood, this is often done in anticipation of surgery and is not appropriate for emergency situations. It takes many days to process the donated blood. RISKS AND COMPLICATIONS Although transfusion therapy is very safe and saves many lives, the main dangers of transfusion include:   Getting an infectious disease.  Developing a transfusion reaction. This is an allergic reaction to something in the blood you were given. Every precaution is taken to prevent this. The decision to have a blood transfusion has been considered carefully by your caregiver before blood is given. Blood is not given unless the benefits outweigh  the risks. AFTER THE TRANSFUSION  Right after receiving a blood transfusion, you will usually feel much better and more energetic. This is especially true if your red blood cells have gotten low (anemic). The transfusion raises the level of the red  blood cells which carry oxygen, and this usually causes an energy increase.  The nurse administering the transfusion will monitor you carefully for complications. HOME CARE INSTRUCTIONS  No special instructions are needed after a transfusion. You may find your energy is better. Speak with your caregiver about any limitations on activity for underlying diseases you may have. SEEK MEDICAL CARE IF:   Your condition is not improving after your transfusion.  You develop redness or irritation at the intravenous (IV) site. SEEK IMMEDIATE MEDICAL CARE IF:  Any of the following symptoms occur over the next 12 hours:  Shaking chills.  You have a temperature by mouth above 102 F (38.9 C), not controlled by medicine.  Chest, back, or muscle pain.  People around you feel you are not acting correctly or are confused.  Shortness of breath or difficulty breathing.  Dizziness and fainting.  You get a rash or develop hives.  You have a decrease in urine output.  Your urine turns a dark color or changes to pink, red, or brown. Any of the following symptoms occur over the next 10 days:  You have a temperature by mouth above 102 F (38.9 C), not controlled by medicine.  Shortness of breath.  Weakness after normal activity.  The white part of the eye turns yellow (jaundice).  You have a decrease in the amount of urine or are urinating less often.  Your urine turns a dark color or changes to pink, red, or brown. Document Released: 12/31/1999 Document Revised: 03/27/2011 Document Reviewed: 08/19/2007 [facility] Patient Information 2014 [place], [place].  _______________________________________________________________________

## 2014-06-10 ENCOUNTER — Encounter (HOSPITAL_COMMUNITY): Payer: Self-pay

## 2014-06-10 ENCOUNTER — Encounter (HOSPITAL_COMMUNITY)
Admission: RE | Admit: 2014-06-10 | Discharge: 2014-06-10 | Disposition: A | Discharge status: Home / Self Care | Payer: Medicare Other | Source: Ambulatory Visit | Attending: Surgery | Admitting: Surgery

## 2014-06-10 DIAGNOSIS — Z01812 Encounter for preprocedural laboratory examination: Secondary | ICD-10-CM | POA: Insufficient documentation

## 2014-06-10 DIAGNOSIS — K5790 Diverticulosis of intestine, part unspecified, without perforation or abscess without bleeding: Secondary | ICD-10-CM

## 2014-06-10 HISTORY — DX: Anemia, unspecified: D64.9

## 2014-06-10 HISTORY — DX: Edema, unspecified: R60.9

## 2014-06-10 HISTORY — DX: Cardiac arrhythmia, unspecified: I49.9

## 2014-06-10 HISTORY — DX: Malignant (primary) neoplasm, unspecified: C80.1

## 2014-06-10 HISTORY — DX: Sleep apnea, unspecified: G47.30

## 2014-06-10 LAB — CBC
HCT: 41 % (ref 39.0–52.0)
HEMOGLOBIN: 14 g/dL (ref 13.0–17.0)
MCH: 33.4 pg (ref 26.0–34.0)
MCHC: 34.1 g/dL (ref 30.0–36.0)
MCV: 97.9 fL (ref 78.0–100.0)
Platelets: 196 10*3/uL (ref 150–400)
RBC: 4.19 MIL/uL — ABNORMAL LOW (ref 4.22–5.81)
RDW: 15.1 % (ref 11.5–15.5)
WBC: 7.1 10*3/uL (ref 4.0–10.5)

## 2014-06-10 LAB — BASIC METABOLIC PANEL
ANION GAP: 9 (ref 5–15)
BUN: 21 mg/dL — AB (ref 6–20)
CHLORIDE: 102 mmol/L (ref 101–111)
CO2: 28 mmol/L (ref 22–32)
Calcium: 9.1 mg/dL (ref 8.9–10.3)
Creatinine, Ser: 1.13 mg/dL (ref 0.61–1.24)
GFR calc non Af Amer: 59 mL/min — ABNORMAL LOW (ref >60)
Glucose, Bld: 154 mg/dL — ABNORMAL HIGH (ref 65–99)
POTASSIUM: 3.7 mmol/L (ref 3.5–5.1)
Sodium: 139 mmol/L (ref 135–145)

## 2014-06-10 NOTE — Consult Note (Signed)
WOC ostomy consult note Patient seen per Dr. Harlow Asa and Dahlstedt's request for assessment of abdomen and preoperative stoma site selection in advance of surgery scheduled for Friday, 5/27.  Rotund abdomen with 9cm round ventral hernia noted at umbilicus. Patient and wife taught about the advances in ostomy equipment and possibilities of resumption of ADLs post recovery.  Sample pouches provided and examined, but both patient and wife decline to take them home at this time as they feel the ostomy is only a possibility.  Two sites selected for stoma placement if needed; patient wears his belt and slacks quite low on the abdomen.  Left sided mark is 8.5cm to the left of the center of the ventral hernia at the umbilicus and in line with it and right sided mark is 10cm from the center of the ventral hernia at the umbilicus and 9.1PH below.  Patient is able to visualize both marks.  Luetta Nutting are made with a surgical site marking pen and covered with thin film transparent dressings. Patient and wife are aware that [facility] employs four [place] Nurses and that they will be supported in the post operative phase by a skilled staff. [place] nursing team will not follow, but will remain available to this patient, the nursing, surgical and medical teams.  Please re-consult if stoma is created intraoperatively. Thanks, Maudie Flakes, MSN, RN, [place], [place], [place] [phone])

## 2014-06-10 NOTE — Consult Note (Signed)
Asked to see pt in pre-op consultation for colo-vesical fistula repair.  Pt has complex heart history dating back to MI in 60's. He has total RCA occlusion, yet has developed collateral circulation. He also has Atrial fibrillation, which is rate controlled with B-blockade, and anti-coagulated with Xarelto (which he has not taken for 7 days in preparation for this surgery.)  Last ECHO '15: EF 40-45%, mild-mod MR, mild AS with mild AI. Last stress test '10: inferior scar, no ischemia.  He saw Dr. Burt Knack 4/16, who clears for surgery with no further testing, as pt is without chest pain or specific cardiac symptoms.  He does recommend amiodarone peri-op if rate control/RVR becomes problematic, as pt is already on high dose B-blockade and likely will not tolerate the addition of Ca++ channel blockers.  Additional problems: OSA managed with BiPAP, obesity, Hb 10.9.    Today: VSS, MP 2 with good mouth opening,  lungs clear to ausc. Nl S1, S2, with II/VI SEM Pt understands NPO after MN, except small sip of water with pills, to continue his metoprolol, even of day of surgery, with small sip of water, hold Xarelto, bring BiPAP, and likely will need transfusion peri-op.  He is prepared for an ICU stay post-op as well, given his moderate risk for cardiac event as well as OSA.  All questions answered.    Jenita Seashore, MD

## 2014-06-10 NOTE — Progress Notes (Signed)
At time of preop appt patient had not yet received bowel prep instructions from Dr Harlow Asa.  Instructed patient to call Dr Harlow Asa office for bowel prep instructions.  Also placed on preop instruction sheet.  Wife also with patient at time of preop appointment.  Also at time of preop appointment patient states " I have a big toe that is sore- unsure if gout or arthritis".  Instructed patient to contact PCP and to let them be aware of issue prior to surgery on 06/11/2013.  Patient and wife voice understanding.

## 2014-06-10 NOTE — Progress Notes (Signed)
Faxed to both Dr Harlow Asa and Dr Diona Fanti [facility] results done 06/10/2014 via EPIC.

## 2014-06-10 NOTE — Progress Notes (Signed)
EKG- 05/05/2014  EPIC  09/10/2013- ECHO- EPIC  08/05/2013  Sleep Study in EPIC CT abd/pelvis 04/22/14 EPIC  05/20/14-LOV pulm-EPIC 05/05/14- LOV- Dr Burt Knack in [facility]

## 2014-06-11 ENCOUNTER — Encounter (HOSPITAL_COMMUNITY): Payer: Self-pay | Admitting: Surgery

## 2014-06-11 ENCOUNTER — Telehealth: Payer: Self-pay | Admitting: Pulmonary Disease

## 2014-06-11 DIAGNOSIS — N321 Vesicointestinal fistula: Secondary | ICD-10-CM | POA: Diagnosis present

## 2014-06-11 NOTE — Telephone Encounter (Signed)
Patient notified.  Nothing further needed. 

## 2014-06-11 NOTE — Progress Notes (Signed)
Quick Note:  These results are acceptable for scheduled surgery.  Jmya Uliano M. Mylisa Brunson, MD, FACS [facility] Office: [phone]   ______ 

## 2014-06-11 NOTE — Telephone Encounter (Signed)
12/h is much better than without CPAP Emphasize using CPPA every night !! upto 4h at least

## 2014-06-11 NOTE — H&P (Signed)
  General Surgery [facility]  Harold Jennings DOB: 1932/04/16 Married / Language: English / Race: White Male  History of Present Illness Patient words: ct results and surgery.  The patient is a 81 year old male who presents with diverticulitis. 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 Patient has been treated with a daily suppressive dose of antibiotics. He has seen his primary care physician, his urologist, and his cardiologist recently. Follow-up CT scan from April 22, 2014 shows slight decrease in size of the abscess between the sigmoid colon and dome of the bladder. It now measures 2.0 cm. There remains some mild inflammatory changes. Acute diverticulitis appears to have resolved. Patient presents today accompanied by his daughter. He is ready to proceed with surgical resection of the sigmoid colon and repair of colovesical fistula. He saw his cardiologist yesterday in preparation.   Allergies No Known Drug Allergies02/18/2016  Medication History Doxazosin Mesylate (8MG  Tablet, Oral) Active. Levothyroxine Sodium (100MCG Tablet, Oral) Active. Metoprolol Succinate ER (50MG  Tablet ER 24HR, Oral) Active. MetroNIDAZOLE (500MG  Tablet, Oral) Active. Xarelto (20MG  Tablet, Oral) Active. Zetia (10MG  Tablet, Oral) Active. Nitrofurantoin Monohyd Macro (100MG  Capsule, Oral) Active. Ciprofloxacin HCl (500MG  Tablet, Oral) Active. [employer] Digestive Probiotic (250MG  Capsule, Oral) Active. Gabapentin (600MG  Tablet, Oral) Active. Lasix (40MG  Tablet, Oral) Active. Multivitamin Adults 50+ (Oral) Active. Medications Reconciled  Vitals Weight: 205 lb Height: 65.5in Body Surface Area: 2.07 m Body Mass Index: 33.59 kg/m Temp.: 97.60F(Oral)  Pulse: 84 (Regular)  BP: 132/86 (Sitting, Left Arm, Standard)    Physical Exam  General - appears comfortable, no distress; not diaphorectic  HEENT -  normocephalic; sclerae clear, gaze conjugate; mucous membranes moist, dentition good; voice normal  Neck - symmetric on extension; no palpable anterior or posterior cervical adenopathy; no palpable masses in the thyroid bed  Chest - clear bilaterally without rhonchi, rales, or wheeze  Cor - regular rhythm with normal rate; no significant murmur  Abd - soft without distension; obvious large umbilical hernia containing incarcerated omentum; mild tenderness left lower quadrant and suprapubic region without palpable mass  Ext -no deformity  Neuro - grossly intact; no tremor    Assessment & Plan  ACUTE DIVERTICULITIS (562.11  K57.92) 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 We reviewed the results of his recent CT scan and I provided them with a copy to review at home. We discussed the procedure, the location of the incision, the concurrent repair of his incarcerated umbilical hernia, the potential need for colostomy placement, and the hospitalization to be anticipated. We discussed the potential need for a short stay at rehabilitation following his hospitalization. Patient and his family understand and wish to proceed with surgery in the near future.  Cardiac clearance has been requested. We will coordinate the surgery schedule with Dr. Diona Fanti from urology. We will arrange for preoperative anesthesia consultation.  The risks and benefits of the procedure have been discussed at length with the patient. The patient understands the proposed procedure, potential alternative treatments, and the course of recovery to be expected. All of the patient's questions have been answered at this time. The patient wishes to proceed with surgery.  Earnstine Regal, MD, [facility] Office: [phone]

## 2014-06-11 NOTE — Telephone Encounter (Signed)
On 14/10 Poor Usage AHI 12/h Is there a problem?

## 2014-06-11 NOTE — Telephone Encounter (Signed)
Patient notified. Patient says he has been using it every night, but only about 3-4 hours.  Patient says that his machine is showing AHI 17, 21/hr.  Patient says that he feels like he sleeps just as well without the CPAP.  Patient is having major surgery tomorrow.  Patient is concerned about how many episodes he is having.

## 2014-06-12 ENCOUNTER — Inpatient Hospital Stay (HOSPITAL_COMMUNITY)
Admission: RE | Admit: 2014-06-12 | Discharge: 2014-06-23 | DRG: 654 | Disposition: A | Discharge status: Skilled Nursing Facility | Payer: Medicare Other | Source: Ambulatory Visit | Attending: Surgery | Admitting: Surgery

## 2014-06-12 ENCOUNTER — Inpatient Hospital Stay (HOSPITAL_COMMUNITY): Payer: Medicare Other | Admitting: Registered Nurse

## 2014-06-12 ENCOUNTER — Encounter (HOSPITAL_COMMUNITY): Payer: Self-pay | Admitting: *Deleted

## 2014-06-12 ENCOUNTER — Encounter (HOSPITAL_COMMUNITY)
Admission: RE | Disposition: A | Discharge status: Skilled Nursing Facility | Payer: Self-pay | Source: Ambulatory Visit | Attending: Surgery

## 2014-06-12 DIAGNOSIS — G4730 Sleep apnea, unspecified: Secondary | ICD-10-CM | POA: Diagnosis present

## 2014-06-12 DIAGNOSIS — I509 Heart failure, unspecified: Secondary | ICD-10-CM | POA: Diagnosis present

## 2014-06-12 DIAGNOSIS — I340 Nonrheumatic mitral (valve) insufficiency: Secondary | ICD-10-CM | POA: Diagnosis present

## 2014-06-12 DIAGNOSIS — I255 Ischemic cardiomyopathy: Secondary | ICD-10-CM | POA: Diagnosis present

## 2014-06-12 DIAGNOSIS — I4891 Unspecified atrial fibrillation: Secondary | ICD-10-CM | POA: Diagnosis present

## 2014-06-12 DIAGNOSIS — K5720 Diverticulitis of large intestine with perforation and abscess without bleeding: Secondary | ICD-10-CM | POA: Diagnosis present

## 2014-06-12 DIAGNOSIS — E876 Hypokalemia: Secondary | ICD-10-CM | POA: Diagnosis not present

## 2014-06-12 DIAGNOSIS — Z823 Family history of stroke: Secondary | ICD-10-CM | POA: Diagnosis not present

## 2014-06-12 DIAGNOSIS — Z833 Family history of diabetes mellitus: Secondary | ICD-10-CM

## 2014-06-12 DIAGNOSIS — I482 Chronic atrial fibrillation: Secondary | ICD-10-CM | POA: Diagnosis present

## 2014-06-12 DIAGNOSIS — E785 Hyperlipidemia, unspecified: Secondary | ICD-10-CM | POA: Diagnosis present

## 2014-06-12 DIAGNOSIS — I252 Old myocardial infarction: Secondary | ICD-10-CM | POA: Diagnosis not present

## 2014-06-12 DIAGNOSIS — Z7901 Long term (current) use of anticoagulants: Secondary | ICD-10-CM

## 2014-06-12 DIAGNOSIS — Z8249 Family history of ischemic heart disease and other diseases of the circulatory system: Secondary | ICD-10-CM | POA: Diagnosis not present

## 2014-06-12 DIAGNOSIS — I2510 Atherosclerotic heart disease of native coronary artery without angina pectoris: Secondary | ICD-10-CM | POA: Diagnosis present

## 2014-06-12 DIAGNOSIS — N321 Vesicointestinal fistula: Principal | ICD-10-CM | POA: Diagnosis present

## 2014-06-12 DIAGNOSIS — I352 Nonrheumatic aortic (valve) stenosis with insufficiency: Secondary | ICD-10-CM | POA: Diagnosis present

## 2014-06-12 DIAGNOSIS — K567 Ileus, unspecified: Secondary | ICD-10-CM | POA: Diagnosis not present

## 2014-06-12 DIAGNOSIS — K420 Umbilical hernia with obstruction, without gangrene: Secondary | ICD-10-CM | POA: Diagnosis present

## 2014-06-12 DIAGNOSIS — I10 Essential (primary) hypertension: Secondary | ICD-10-CM | POA: Diagnosis present

## 2014-06-12 DIAGNOSIS — Z85828 Personal history of other malignant neoplasm of skin: Secondary | ICD-10-CM

## 2014-06-12 DIAGNOSIS — Z01812 Encounter for preprocedural laboratory examination: Secondary | ICD-10-CM

## 2014-06-12 DIAGNOSIS — E8770 Fluid overload, unspecified: Secondary | ICD-10-CM

## 2014-06-12 DIAGNOSIS — Z79899 Other long term (current) drug therapy: Secondary | ICD-10-CM | POA: Diagnosis not present

## 2014-06-12 DIAGNOSIS — Z96649 Presence of unspecified artificial hip joint: Secondary | ICD-10-CM | POA: Diagnosis present

## 2014-06-12 DIAGNOSIS — K5732 Diverticulitis of large intestine without perforation or abscess without bleeding: Secondary | ICD-10-CM | POA: Diagnosis present

## 2014-06-12 HISTORY — PX: CYSTOSTOMY: SHX155

## 2014-06-12 HISTORY — PX: PARTIAL COLECTOMY: SHX5273

## 2014-06-12 HISTORY — DX: Umbilical hernia without obstruction or gangrene: K42.9

## 2014-06-12 HISTORY — DX: Diverticulosis of intestine, part unspecified, without perforation or abscess without bleeding: K57.90

## 2014-06-12 HISTORY — PX: UMBILICAL HERNIA REPAIR: SHX196

## 2014-06-12 LAB — CBC
HCT: 37.5 % — ABNORMAL LOW (ref 39.0–52.0)
HEMOGLOBIN: 13.1 g/dL (ref 13.0–17.0)
MCH: 33.3 pg (ref 26.0–34.0)
MCHC: 34.9 g/dL (ref 30.0–36.0)
MCV: 95.4 fL (ref 78.0–100.0)
Platelets: 176 10*3/uL (ref 150–400)
RBC: 3.93 MIL/uL — ABNORMAL LOW (ref 4.22–5.81)
RDW: 14.8 % (ref 11.5–15.5)
WBC: 9.1 10*3/uL (ref 4.0–10.5)

## 2014-06-12 LAB — CREATININE, SERUM
Creatinine, Ser: 0.99 mg/dL (ref 0.61–1.24)
GFR calc Af Amer: >60 mL/min (ref >60)
GFR calc non Af Amer: >60 mL/min (ref >60)

## 2014-06-12 LAB — TYPE AND SCREEN
ABO/RH(D): B NEG
ANTIBODY SCREEN: NEGATIVE

## 2014-06-12 LAB — MRSA PCR SCREENING: MRSA BY PCR: POSITIVE — AB

## 2014-06-12 SURGERY — COLECTOMY, PARTIAL
Anesthesia: General

## 2014-06-12 MED ORDER — ONDANSETRON HCL 4 MG/2ML IJ SOLN
INTRAMUSCULAR | Status: AC
  Filled 2014-06-12: qty 2

## 2014-06-12 MED ORDER — PROPOFOL 10 MG/ML IV BOLUS
INTRAVENOUS | Status: DC | PRN
  Administered 2014-06-12: 180 mg via INTRAVENOUS

## 2014-06-12 MED ORDER — HYDROMORPHONE 0.3 MG/ML IV SOLN
INTRAVENOUS | Status: DC
  Administered 2014-06-12: 0.199 mg via INTRAVENOUS
  Administered 2014-06-12: 0.2 mg via INTRAVENOUS
  Administered 2014-06-12: 0.4 mg via INTRAVENOUS
  Administered 2014-06-13 (×5): 0.2 mg via INTRAVENOUS
  Administered 2014-06-13: 0.599 mg via INTRAVENOUS
  Administered 2014-06-14: 0.2 mg via INTRAVENOUS
  Administered 2014-06-14: 0.479 mg via INTRAVENOUS
  Administered 2014-06-14: 0.4 mg via INTRAVENOUS
  Administered 2014-06-14: 14 mg via INTRAVENOUS
  Administered 2014-06-15 (×2): 0 mg via INTRAVENOUS

## 2014-06-12 MED ORDER — MIDAZOLAM HCL 2 MG/2ML IJ SOLN
INTRAMUSCULAR | Status: AC
  Filled 2014-06-12: qty 2

## 2014-06-12 MED ORDER — ONDANSETRON HCL 4 MG/2ML IJ SOLN
4.0000 mg | Freq: Four times a day (QID) | INTRAMUSCULAR | Status: DC | PRN
  Administered 2014-06-12 – 2014-06-17 (×4): 4 mg via INTRAVENOUS
  Filled 2014-06-12 (×4): qty 2

## 2014-06-12 MED ORDER — PROMETHAZINE HCL 25 MG/ML IJ SOLN: 12.5000 mg | Freq: Once | INTRAMUSCULAR | Status: DC | PRN

## 2014-06-12 MED ORDER — ACETAMINOPHEN 10 MG/ML IV SOLN
1000.0000 mg | Freq: Once | INTRAVENOUS | Status: DC
  Filled 2014-06-12: qty 100

## 2014-06-12 MED ORDER — HYDROMORPHONE 0.3 MG/ML IV SOLN
INTRAVENOUS | Status: AC
  Filled 2014-06-12: qty 25

## 2014-06-12 MED ORDER — ONDANSETRON HCL 4 MG/2ML IJ SOLN
4.0000 mg | Freq: Four times a day (QID) | INTRAMUSCULAR | Status: DC | PRN
  Administered 2014-06-14: 4 mg via INTRAVENOUS
  Filled 2014-06-12 (×2): qty 2

## 2014-06-12 MED ORDER — ENOXAPARIN SODIUM 40 MG/0.4ML ~~LOC~~ SOLN
40.0000 mg | SUBCUTANEOUS | Status: DC
  Administered 2014-06-13 – 2014-06-17 (×5): 40 mg via SUBCUTANEOUS
  Filled 2014-06-12 (×6): qty 0.4

## 2014-06-12 MED ORDER — CHLORHEXIDINE GLUCONATE CLOTH 2 % EX PADS
6.0000 | MEDICATED_PAD | Freq: Every day | CUTANEOUS | Status: AC
  Administered 2014-06-12 – 2014-06-16 (×5): 6 via TOPICAL

## 2014-06-12 MED ORDER — FENTANYL CITRATE (PF) 250 MCG/5ML IJ SOLN
INTRAMUSCULAR | Status: AC
  Filled 2014-06-12: qty 5

## 2014-06-12 MED ORDER — MUPIROCIN 2 % EX OINT
1.0000 "application " | TOPICAL_OINTMENT | Freq: Two times a day (BID) | CUTANEOUS | Status: AC
  Administered 2014-06-12 – 2014-06-16 (×9): 1 via NASAL
  Filled 2014-06-12: qty 22

## 2014-06-12 MED ORDER — SUCCINYLCHOLINE CHLORIDE 20 MG/ML IJ SOLN
INTRAMUSCULAR | Status: DC | PRN
  Administered 2014-06-12: 180 mg via INTRAVENOUS

## 2014-06-12 MED ORDER — ERTAPENEM SODIUM 1 G IJ SOLR
1.0000 g | INTRAMUSCULAR | Status: DC | PRN
  Administered 2014-06-12: 1 g via INTRAVENOUS

## 2014-06-12 MED ORDER — LABETALOL HCL 5 MG/ML IV SOLN
INTRAVENOUS | Status: DC | PRN
  Administered 2014-06-12: 2.5 mg via INTRAVENOUS

## 2014-06-12 MED ORDER — DEXAMETHASONE SODIUM PHOSPHATE 10 MG/ML IJ SOLN
INTRAMUSCULAR | Status: AC
  Filled 2014-06-12: qty 1

## 2014-06-12 MED ORDER — LIDOCAINE HCL (CARDIAC) 20 MG/ML IV SOLN
INTRAVENOUS | Status: AC
  Filled 2014-06-12: qty 5

## 2014-06-12 MED ORDER — ROCURONIUM BROMIDE 100 MG/10ML IV SOLN
INTRAVENOUS | Status: DC | PRN
  Administered 2014-06-12 (×2): 10 mg via INTRAVENOUS
  Administered 2014-06-12: 60 mg via INTRAVENOUS
  Administered 2014-06-12: 10 mg via INTRAVENOUS

## 2014-06-12 MED ORDER — KETOROLAC TROMETHAMINE 30 MG/ML IJ SOLN: 30.0000 mg | Freq: Once | INTRAMUSCULAR | Status: DC | PRN

## 2014-06-12 MED ORDER — MIDAZOLAM HCL 5 MG/5ML IJ SOLN
INTRAMUSCULAR | Status: DC | PRN
  Administered 2014-06-12: 2 mg via INTRAVENOUS

## 2014-06-12 MED ORDER — PHENYLEPHRINE HCL 10 MG/ML IJ SOLN
INTRAMUSCULAR | Status: AC
  Filled 2014-06-12: qty 1

## 2014-06-12 MED ORDER — PROMETHAZINE HCL 25 MG/ML IJ SOLN
6.2500 mg | INTRAMUSCULAR | Status: DC | PRN
Start: 2014-06-12 — End: 2014-06-12

## 2014-06-12 MED ORDER — PROMETHAZINE HCL 25 MG/ML IJ SOLN
12.5000 mg | INTRAMUSCULAR | Status: DC | PRN
  Administered 2014-06-12 – 2014-06-15 (×3): 12.5 mg via INTRAVENOUS
  Filled 2014-06-12 (×3): qty 1

## 2014-06-12 MED ORDER — DEXAMETHASONE SODIUM PHOSPHATE 10 MG/ML IJ SOLN
INTRAMUSCULAR | Status: DC | PRN
  Administered 2014-06-12: 10 mg via INTRAVENOUS

## 2014-06-12 MED ORDER — SODIUM CHLORIDE 0.9 % IV SOLN
1.0000 g | INTRAVENOUS | Status: DC
  Filled 2014-06-12 (×2): qty 1

## 2014-06-12 MED ORDER — DIPHENHYDRAMINE HCL 50 MG/ML IJ SOLN
12.5000 mg | Freq: Four times a day (QID) | INTRAMUSCULAR | Status: DC | PRN
  Filled 2014-06-12: qty 1

## 2014-06-12 MED ORDER — LIDOCAINE HCL (CARDIAC) 20 MG/ML IV SOLN
INTRAVENOUS | Status: DC | PRN
  Administered 2014-06-12: 75 mg via INTRAVENOUS
  Administered 2014-06-12: 25 mg via INTRATRACHEAL

## 2014-06-12 MED ORDER — PHENYLEPHRINE 40 MCG/ML (10ML) SYRINGE FOR IV PUSH (FOR BLOOD PRESSURE SUPPORT)
PREFILLED_SYRINGE | INTRAVENOUS | Status: AC
  Filled 2014-06-12: qty 10

## 2014-06-12 MED ORDER — PHENYLEPHRINE HCL 10 MG/ML IJ SOLN
10.0000 mg | INTRAVENOUS | Status: DC | PRN
  Administered 2014-06-12: 25 ug/min via INTRAVENOUS
  Administered 2014-06-12: 15 ug/min via INTRAVENOUS

## 2014-06-12 MED ORDER — ONDANSETRON HCL 4 MG PO TABS
4.0000 mg | ORAL_TABLET | Freq: Four times a day (QID) | ORAL | Status: DC | PRN
  Filled 2014-06-12: qty 1

## 2014-06-12 MED ORDER — ALVIMOPAN 12 MG PO CAPS: 12.0000 mg | ORAL_CAPSULE | Freq: Two times a day (BID) | ORAL | Status: DC

## 2014-06-12 MED ORDER — HYDROMORPHONE HCL 1 MG/ML IJ SOLN
INTRAMUSCULAR | Status: DC | PRN
  Administered 2014-06-12 (×2): 1 mg via INTRAVENOUS

## 2014-06-12 MED ORDER — SODIUM CHLORIDE 0.9 % IJ SOLN: 9.0000 mL | INTRAMUSCULAR | Status: DC | PRN

## 2014-06-12 MED ORDER — HYDROMORPHONE HCL 1 MG/ML IJ SOLN: 0.2500 mg | INTRAMUSCULAR | Status: DC | PRN

## 2014-06-12 MED ORDER — SUGAMMADEX SODIUM 200 MG/2ML IV SOLN
INTRAVENOUS | Status: DC | PRN
  Administered 2014-06-12: 200 mg via INTRAVENOUS

## 2014-06-12 MED ORDER — NALOXONE HCL 0.4 MG/ML IJ SOLN: 0.4000 mg | INTRAMUSCULAR | Status: DC | PRN

## 2014-06-12 MED ORDER — FENTANYL CITRATE (PF) 100 MCG/2ML IJ SOLN
INTRAMUSCULAR | Status: DC | PRN
  Administered 2014-06-12 (×2): 100 ug via INTRAVENOUS
  Administered 2014-06-12: 50 ug via INTRAVENOUS
  Administered 2014-06-12: 100 ug via INTRAVENOUS

## 2014-06-12 MED ORDER — PROPOFOL 10 MG/ML IV BOLUS
INTRAVENOUS | Status: AC
  Filled 2014-06-12: qty 20

## 2014-06-12 MED ORDER — HYDROMORPHONE HCL 2 MG/ML IJ SOLN
INTRAMUSCULAR | Status: AC
  Filled 2014-06-12: qty 1

## 2014-06-12 MED ORDER — DIPHENHYDRAMINE HCL 12.5 MG/5ML PO ELIX
12.5000 mg | ORAL_SOLUTION | Freq: Four times a day (QID) | ORAL | Status: DC | PRN
  Filled 2014-06-12: qty 5

## 2014-06-12 MED ORDER — LACTATED RINGERS IV SOLN
INTRAVENOUS | Status: DC | PRN
  Administered 2014-06-12 (×2): via INTRAVENOUS

## 2014-06-12 MED ORDER — SUGAMMADEX SODIUM 500 MG/5ML IV SOLN
INTRAVENOUS | Status: AC
  Filled 2014-06-12: qty 5

## 2014-06-12 MED ORDER — PHENYLEPHRINE HCL 10 MG/ML IJ SOLN
INTRAMUSCULAR | Status: DC | PRN
  Administered 2014-06-12 (×2): 80 ug via INTRAVENOUS
  Administered 2014-06-12: 120 ug via INTRAVENOUS

## 2014-06-12 MED ORDER — CETYLPYRIDINIUM CHLORIDE 0.05 % MT LIQD
7.0000 mL | Freq: Two times a day (BID) | OROMUCOSAL | Status: DC
  Administered 2014-06-12 – 2014-06-23 (×20): 7 mL via OROMUCOSAL

## 2014-06-12 MED ORDER — BUPIVACAINE HCL (PF) 0.25 % IJ SOLN
INTRAMUSCULAR | Status: AC
  Filled 2014-06-12: qty 30

## 2014-06-12 MED ORDER — ROCURONIUM BROMIDE 100 MG/10ML IV SOLN
INTRAVENOUS | Status: AC
  Filled 2014-06-12: qty 1

## 2014-06-12 MED ORDER — ONDANSETRON HCL 4 MG/2ML IJ SOLN
INTRAMUSCULAR | Status: DC | PRN
  Administered 2014-06-12: 4 mg via INTRAVENOUS

## 2014-06-12 MED ORDER — LACTATED RINGERS IV SOLN
INTRAVENOUS | Status: DC | PRN
  Administered 2014-06-12 (×2): via INTRAVENOUS

## 2014-06-12 MED ORDER — METOPROLOL TARTRATE 1 MG/ML IV SOLN
5.0000 mg | Freq: Four times a day (QID) | INTRAVENOUS | Status: DC
  Administered 2014-06-12 – 2014-06-15 (×11): 5 mg via INTRAVENOUS
  Filled 2014-06-12 (×15): qty 5

## 2014-06-12 MED ORDER — KCL IN DEXTROSE-NACL 20-5-0.45 MEQ/L-%-% IV SOLN
INTRAVENOUS | Status: DC
  Administered 2014-06-12: 14:00:00 via INTRAVENOUS
  Administered 2014-06-13: 1000 mL via INTRAVENOUS
  Administered 2014-06-13 – 2014-06-16 (×4): via INTRAVENOUS
  Administered 2014-06-16: 100 mL via INTRAVENOUS
  Administered 2014-06-17: 04:00:00 via INTRAVENOUS
  Administered 2014-06-17: 100 mL/h via INTRAVENOUS
  Administered 2014-06-18: 03:00:00 via INTRAVENOUS
  Administered 2014-06-19: 50 mL/h via INTRAVENOUS
  Administered 2014-06-20 (×2): via INTRAVENOUS
  Filled 2014-06-12 (×17): qty 1000

## 2014-06-12 SURGICAL SUPPLY — 73 items
APL SKNCLS STERI-STRIP NONHPOA (GAUZE/BANDAGES/DRESSINGS) ×1
BENZOIN TINCTURE PRP APPL 2/3 (GAUZE/BANDAGES/DRESSINGS) ×3 IMPLANT
BLADE EXTENDED COATED 6.5IN (ELECTRODE) ×2 IMPLANT
BLADE HEX COATED 2.75 (ELECTRODE) ×6 IMPLANT
BLADE SURG SZ10 CARB STEEL (BLADE) ×6 IMPLANT
CATH FOLEY 2WAY SLVR  5CC 20FR (CATHETERS) ×2
CATH FOLEY 2WAY SLVR 5CC 20FR (CATHETERS) IMPLANT
CHLORAPREP W/TINT 26ML (MISCELLANEOUS) ×3 IMPLANT
CLEANER TIP ELECTROSURG 2X2 (MISCELLANEOUS) ×3 IMPLANT
CLOSURE WOUND 1/2 X4 (GAUZE/BANDAGES/DRESSINGS)
COVER MAYO STAND STRL (DRAPES) ×6 IMPLANT
DECANTER SPIKE VIAL GLASS SM (MISCELLANEOUS) ×1 IMPLANT
DRAPE LAPAROSCOPIC ABDOMINAL (DRAPES) ×3 IMPLANT
DRAPE LAPAROTOMY T 102X78X121 (DRAPES) ×3 IMPLANT
DRAPE SHEET LG 3/4 BI-LAMINATE (DRAPES) ×3 IMPLANT
DRAPE UTILITY XL STRL (DRAPES) ×6 IMPLANT
DRAPE WARM FLUID 44X44 (DRAPE) ×3 IMPLANT
DRSG OPSITE POSTOP 4X10 (GAUZE/BANDAGES/DRESSINGS) IMPLANT
DRSG OPSITE POSTOP 4X6 (GAUZE/BANDAGES/DRESSINGS) IMPLANT
DRSG OPSITE POSTOP 4X8 (GAUZE/BANDAGES/DRESSINGS) IMPLANT
DRSG PAD ABDOMINAL 8X10 ST (GAUZE/BANDAGES/DRESSINGS) IMPLANT
ELECT REM PT RETURN 9FT ADLT (ELECTROSURGICAL) ×3
ELECTRODE REM PT RTRN 9FT ADLT (ELECTROSURGICAL) ×1 IMPLANT
GAUZE SPONGE 4X4 12PLY STRL (GAUZE/BANDAGES/DRESSINGS) IMPLANT
GLOVE BIOGEL PI IND STRL 7.0 (GLOVE) ×1 IMPLANT
GLOVE BIOGEL PI INDICATOR 7.0 (GLOVE) ×2
GLOVE SURG ORTHO 8.0 STRL STRW (GLOVE) ×16 IMPLANT
GOWN STRL REUS W/TWL LRG LVL3 (GOWN DISPOSABLE) ×1 IMPLANT
GOWN STRL REUS W/TWL XL LVL3 (GOWN DISPOSABLE) ×24 IMPLANT
KIT BASIN OR (CUSTOM PROCEDURE TRAY) ×5 IMPLANT
LASER FIBER DISP (UROLOGICAL SUPPLIES) IMPLANT
LEGGING LITHOTOMY PAIR STRL (DRAPES) ×3 IMPLANT
LIGASURE IMPACT 36 18CM CVD LR (INSTRUMENTS) ×2 IMPLANT
LUBRICANT JELLY K Y 4OZ (MISCELLANEOUS) IMPLANT
MARKER SKIN DUAL TIP RULER LAB (MISCELLANEOUS) ×3 IMPLANT
NDL HYPO 25X1 1.5 SAFETY (NEEDLE) ×1 IMPLANT
NEEDLE HYPO 25X1 1.5 SAFETY (NEEDLE) ×3 IMPLANT
NS IRRIG 1000ML POUR BTL (IV SOLUTION) ×11 IMPLANT
PACK BASIC VI WITH GOWN DISP (CUSTOM PROCEDURE TRAY) ×1 IMPLANT
PACK GENERAL/GYN (CUSTOM PROCEDURE TRAY) ×3 IMPLANT
PENCIL BUTTON HOLSTER BLD 10FT (ELECTRODE) ×5 IMPLANT
SPONGE LAP 18X18 X RAY DECT (DISPOSABLE) ×3 IMPLANT
SPONGE LAP 4X18 X RAY DECT (DISPOSABLE) ×3 IMPLANT
STAPLER PROXIMATE 75MM BLUE (STAPLE) ×2 IMPLANT
STAPLER VISISTAT 35W (STAPLE) ×3 IMPLANT
STRIP CLOSURE SKIN 1/2X4 (GAUZE/BANDAGES/DRESSINGS) ×1 IMPLANT
SUCTION POOLE TIP (SUCTIONS) ×5 IMPLANT
SUT ETHIBOND NAB CT1 #1 30IN (SUTURE) ×3 IMPLANT
SUT MNCRL AB 4-0 PS2 18 (SUTURE) ×3 IMPLANT
SUT NOV 1 T60/GS (SUTURE) IMPLANT
SUT NOVA NAB DX-16 0-1 5-0 T12 (SUTURE) ×10 IMPLANT
SUT NOVA T20/GS 25 (SUTURE) IMPLANT
SUT PDS AB 1 TP1 96 (SUTURE) IMPLANT
SUT SILK 2 0 (SUTURE) ×6
SUT SILK 2 0 SH CR/8 (SUTURE) ×1 IMPLANT
SUT SILK 2 0SH CR/8 30 (SUTURE) ×2 IMPLANT
SUT SILK 2-0 18XBRD TIE 12 (SUTURE) ×1 IMPLANT
SUT SILK 2-0 30XBRD TIE 12 (SUTURE) ×1 IMPLANT
SUT SILK 3 0 (SUTURE) ×3
SUT SILK 3 0 SH CR/8 (SUTURE) ×9 IMPLANT
SUT SILK 3-0 18XBRD TIE 12 (SUTURE) ×1 IMPLANT
SUT VIC AB 2-0 SH 27 (SUTURE) ×3
SUT VIC AB 2-0 SH 27X BRD (SUTURE) IMPLANT
SUT VIC AB 3-0 SH 18 (SUTURE) ×7 IMPLANT
SYR BULB IRRIGATION 50ML (SYRINGE) ×2 IMPLANT
SYR CONTROL 10ML LL (SYRINGE) ×3 IMPLANT
TOWEL OR 17X26 10 PK STRL BLUE (TOWEL DISPOSABLE) ×6 IMPLANT
TOWEL OR NON WOVEN STRL DISP B (DISPOSABLE) ×6 IMPLANT
TRAY FOLEY W/METER SILVER 14FR (SET/KITS/TRAYS/PACK) ×3 IMPLANT
TUBING CONNECTING 10 (TUBING) IMPLANT
TUBING CONNECTING 10' (TUBING)
WATER STERILE IRR 1500ML POUR (IV SOLUTION) ×1 IMPLANT
YANKAUER SUCT BULB TIP 10FT TU (MISCELLANEOUS) ×3 IMPLANT

## 2014-06-12 NOTE — H&P (Signed)
Urology History and Physical Exam  CC: Colovesical fistula  HPI: 81  year old male presents for elective colectomy/cystorrhaphy for treatment of a colovesical fistula secondary to acute diverticulitis and a pericolonic abscess.  PMH: Past Medical History  Diagnosis Date  . Dyslipidemia   . HTN (hypertension)   . Obesity   . Increased liver enzymes     felt to be a fatty liver per ultrasound in 2011  . Dyspnea   . Ischemic cardiomyopathy   . Spinal stenosis   . Sleep apnea     bipap   . CAD (coronary artery disease)     Silent MI sometime prior to 2000. S/P cath in 2000 showing a total mid RCA with collaterals from the LCX. EF is 40 to50%; Last Myoview in 2010 showing inferior scar without ischemia. EF was 52%.  Marland Kitchen Dysrhythmia     atrial fib   . Edema     lower extremity   . Cancer     skin cancer   . Anemia     PSH: Past Surgical History  Procedure Laterality Date  . Total hip arthroplasty    . Eye surgery    . Lower back surgery      spinal stenosis  . Shoulder surgery    . Cardioversion N/A 06/06/2012    Procedure: CARDIOVERSION;  Surgeon: Thayer Headings, MD;  Location: [facility] ENDOSCOPY;  Service: Cardiovascular;  Laterality: N/A;  . Cardiac catheterization      Allergies: No Known Allergies  Medications: Prescriptions prior to admission  Medication Sig Dispense Refill Last Dose  . ciprofloxacin (CIPRO) 500 MG tablet Take 1 tablet (500 mg total) by mouth 2 (two) times daily. (Patient not taking: Reported on 06/09/2014) 20 tablet 2 Not Taking  . diphenhydrAMINE (BENADRYL) 25 mg capsule Take 25 mg by mouth at bedtime.    Taking  . doxazosin (CARDURA) 8 MG tablet Take 8 mg by mouth at bedtime.   Taking  . ezetimibe (ZETIA) 10 MG tablet Take 10 mg by mouth every morning.    Taking  . furosemide (LASIX) 40 MG tablet Take 40 mg by mouth as needed for fluid or edema (fluid).    Taking  . ibuprofen (ADVIL,MOTRIN) 200 MG tablet Take 400 mg by mouth every 6 (six) hours as  needed for headache or moderate pain.     Marland Kitchen lactose free nutrition (BOOST) LIQD Take 237 mLs by mouth daily at 12 noon.     Marland Kitchen levothyroxine (SYNTHROID, LEVOTHROID) 100 MCG tablet Take 100 mcg by mouth daily.  12 Taking  . loperamide (IMODIUM) 1 MG/5ML solution Take 10 mLs (2 mg total) by mouth every 6 (six) hours as needed for diarrhea or loose stools. 120 mL 0 Taking  . metoprolol succinate (TOPROL-XL) 50 MG 24 hr tablet Take 50 mg by mouth every morning. Take with or immediately following a meal.   Taking  . Multiple Vitamin (MULTIVITAMIN) tablet Take 1 tablet by mouth every morning.    Taking  . Rivaroxaban (XARELTO) 15 MG TABS tablet Take 1 tablet (15 mg total) by mouth daily with supper. 30 tablet 0 Taking  . saccharomyces boulardii (FLORASTOR) 250 MG capsule Take 250 mg by mouth every morning.    Taking  . finasteride (PROSCAR) 5 MG tablet Take 5 mg by mouth daily.     . metroNIDAZOLE (FLAGYL) 500 MG tablet Take 1 tablet (500 mg total) by mouth 3 (three) times daily. (Patient not taking: Reported on 06/08/2014) 30 tablet  2 Taking     Social History: History   Social History  . Marital Status: Married    Spouse Name: N/A  . Number of Children: 2  . Years of Education: N/A   Occupational History  . retired    Social History Main Topics  . Smoking status: Never Smoker   . Smokeless tobacco: Never Used  . Alcohol Use: Yes     Comment: 2 glasses of wine daily   . Drug Use: No  . Sexual Activity: No   Other Topics Concern  . Not on file   Social History Narrative    Family History: Family History  Problem Relation Age of Onset  . Stroke Father   . Heart attack Father   . Hypertension Father   . Diabetes Father   . Drug abuse Mother   . Hypertension Brother   . Hypertension Brother   . Hypertension Brother   . Hypertension Sister     Review of Systems: Positive: Hematuria, pneumaturia, dysuria Negative:   A further 10 point review of systems was negative except  what is listed in the HPI.                  Physical Exam: @VITALS2 @ General: No acute distress.  Awake. Head:  Normocephalic.  Atraumatic. ENT:  EOMI.  Mucous membranes moist Neck:  Supple.  No lymphadenopathy. CV:  S1 present. S2 present. Regular rate. Pulmonary: Equal effort bilaterally.  Clear to auscultation bilaterally. Abdomen: Soft.  LLQ tenderness Skin:  Normal turgor.  No visible rash. Extremity: No gross deformity of bilateral upper extremities.  No gross deformity of                             lower extremities. Neurologic: Alert. Appropriate mood.    Studies:  Recent Labs     06/10/14  1330  HGB  14.0  WBC  7.1  PLT  196    Recent Labs     06/10/14  1330  NA  139  K  3.7  CL  102  CO2  28  BUN  21*  CREATININE  1.13  CALCIUM  9.1  GFRNONAA  59*  GFRAA  >60     No results for input(s): INR, APTT in the last 72 hours.  Invalid input(s): PT   Invalid input(s): ABG    Assessment:  Colovesical fistula/diverticulitis  Plan: Colectomy/cystorrhaphy

## 2014-06-12 NOTE — Anesthesia Postprocedure Evaluation (Signed)
  Anesthesia Post-op Note  Patient: Harold Jennings  Procedure(s) Performed: Procedure(s) (LRB): SIGMOID COLECTOMY AND TAKEDOWN OF COLOVESICAL FISTULA  (N/A) INCARCERATED UMBILICAL HERNIA REPAIR   (N/A) CYSTORRHAPHY (N/A)  Patient Location: PACU  Anesthesia Type: General  Level of Consciousness: awake and alert   Airway and Oxygen Therapy: Patient Spontanous Breathing  Post-op Pain: mild  Post-op Assessment: Post-op Vital signs reviewed, Patient's Cardiovascular Status Stable, Respiratory Function Stable, Patent Airway and No signs of Nausea or vomiting  Last Vitals:  Filed Vitals:   06/12/14 1148  BP: 138/79  Pulse: 80  Temp:   Resp: 14    Post-op Vital Signs: stable   Complications: No apparent anesthesia complications

## 2014-06-12 NOTE — Interval H&P Note (Signed)
History and Physical Interval Note:  06/12/2014 7:04 AM  Harold Jennings  has presented today for surgery, with the diagnosis of DIVERTICULAR DISEASE, COLOVESICAL FISTULA, INCARCERATED UMBILICAL HERNIA  The various methods of treatment have been discussed with the patient and family. After consideration of risks, benefits and other options for treatment, the patient has consented to   Exploratory laparotomy, sigmoid colectomy, takedown of colovesical fistula, primary repair of incarcerated umbilical hernia  as a surgical intervention .  The patient's history has been reviewed, patient examined, no change in status, stable for surgery.  I have reviewed the patient's chart and labs.  Questions were answered to the patient's satisfaction.    Earnstine Regal, MD, [facility] Office: [place]

## 2014-06-12 NOTE — Anesthesia Preprocedure Evaluation (Signed)
Anesthesia Evaluation  Patient identified by MRN, date of birth, ID band Patient awake    Reviewed: Allergy & Precautions, NPO status , Patient's Chart, lab work & pertinent test results  Airway Mallampati: II  TM Distance: >3 FB Neck ROM: Full    Dental no notable dental hx.    Pulmonary sleep apnea ,  breath sounds clear to auscultation  Pulmonary exam normal       Cardiovascular hypertension, Pt. on medications and Pt. on home beta blockers + CAD and +CHF Normal cardiovascular examRhythm:Regular Rate:Normal     Neuro/Psych negative neurological ROS  negative psych ROS   GI/Hepatic negative GI ROS, Neg liver ROS,   Endo/Other  negative endocrine ROS  Renal/GU negative Renal ROS  negative genitourinary   Musculoskeletal negative musculoskeletal ROS (+)   Abdominal   Peds negative pediatric ROS (+)  Hematology negative hematology ROS (+)   Anesthesia Other Findings   Reproductive/Obstetrics negative OB ROS                             Anesthesia Physical Anesthesia Plan  ASA: III  Anesthesia Plan: General   Post-op Pain Management:    Induction: Intravenous  Airway Management Planned: Oral ETT  Additional Equipment: Arterial line  Intra-op Plan:   Post-operative Plan: Extubation in OR  Informed Consent: I have reviewed the patients History and Physical, chart, labs and discussed the procedure including the risks, benefits and alternatives for the proposed anesthesia with the patient or authorized representative who has indicated his/her understanding and acceptance.   Dental advisory given  Plan Discussed with: CRNA and Surgeon  Anesthesia Plan Comments:         Anesthesia Quick Evaluation

## 2014-06-12 NOTE — Progress Notes (Signed)
Pt recently given zofran for nausea/sour stomach.  Pt vomited prior to application of nighttime bipap.  Rn notified.  This RT will hold off on applying bipap at this time and reassess when pt feels his stomach has settled.  RN aware.  Pt remains on 2lnc/etco2, spo2 96%.  RT will continue to monitor pt.

## 2014-06-12 NOTE — Brief Op Note (Signed)
06/12/2014  10:22 AM  PATIENT:  Harold Jennings  81 y.o. male  PRE-OPERATIVE DIAGNOSIS:  DIVERTICULAR DISEASE, COLOVESICAL FISTULA, INCARCERATED UMBILICAL HERNIA  POST-OPERATIVE DIAGNOSIS:  diverticular disease, colovesical fistula  PROCEDURE:  Procedure(s): SIGMOID COLECTOMY AND TAKEDOWN OF COLOVESICAL FISTULA  (N/A) INCARCERATED UMBILICAL HERNIA REPAIR   (N/A) CYSTORRHAPHY (N/A)  SURGEON:  Surgeon(s) and Role: Panel 1:    * Armandina Gemma, MD - Primary    * Excell Seltzer, MD - Assisting  Panel 2:    * Franchot Gallo, MD - Primary  ANESTHESIA:   general  EBL:  Total I/O In: 1002.5 [I.V.:1002.5] Out: 75 [Urine:25; Blood:50]  BLOOD ADMINISTERED:none  DRAINS: none   LOCAL MEDICATIONS USED:  NONE  SPECIMEN:  Excision  DISPOSITION OF SPECIMEN:  PATHOLOGY  COUNTS:  YES  TOURNIQUET:  * No tourniquets in log *  DICTATION: .Other Dictation: Dictation Number [id number]  PLAN OF CARE: Admit to inpatient   PATIENT DISPOSITION:  PACU   Delay start of Pharmacological VTE agent (>24hrs) due to surgical blood loss or risk of bleeding: yes  Earnstine Regal, MD, [facility] Office: [phone]

## 2014-06-12 NOTE — Anesthesia Procedure Notes (Signed)
Procedure Name: Intubation Date/Time: 06/12/2014 7:48 AM Performed by: Lissa Morales Pre-anesthesia Checklist: Patient identified, Emergency Drugs available, Suction available and Patient being monitored Patient Re-evaluated:Patient Re-evaluated prior to inductionOxygen Delivery Method: Circle System Utilized Preoxygenation: Pre-oxygenation with 100% oxygen Intubation Type: IV induction Ventilation: Mask ventilation without difficulty Laryngoscope Size: Miller and 3 Grade View: Grade III Tube type: Oral Tube size: 8.0 (taper tube for potential post op ventilation) mm Number of attempts: 1 Airway Equipment and Method: Stylet and Oral airway Placement Confirmation: ETT inserted through vocal cords under direct vision,  positive ETCO2 and breath sounds checked- equal and bilateral Secured at: 22 cm Tube secured with: Tape Dental Injury: Teeth and Oropharynx as per pre-operative assessment  Difficulty Due To: Difficulty was anticipated, Difficult Airway- due to large tongue, Difficult Airway- due to reduced neck mobility, Difficult Airway- due to anterior larynx and Difficult Airway- due to dentition Future Recommendations: Recommend- induction with short-acting agent, and alternative techniques readily available

## 2014-06-12 NOTE — Transfer of Care (Signed)
Immediate Anesthesia Transfer of Care Note  Patient: Harold Jennings  Procedure(s) Performed: Procedure(s): SIGMOID COLECTOMY AND TAKEDOWN OF COLOVESICAL FISTULA  (N/A) INCARCERATED UMBILICAL HERNIA REPAIR   (N/A) CYSTORRHAPHY (N/A)  Patient Location: PACU  Anesthesia Type:General  Level of Consciousness: awake, alert , oriented and patient cooperative  Airway & Oxygen Therapy: Patient Spontanous Breathing and Patient connected to face mask oxygen  Post-op Assessment: Report given to RN, Post -op Vital signs reviewed and stable and Patient moving all extremities X 4  Post vital signs: stable  Last Vitals:  Filed Vitals:   06/12/14 0544  BP: 148/80  Pulse: 114  Temp: 36.4 C  Resp: 16    Complications: No apparent anesthesia complications

## 2014-06-13 LAB — BASIC METABOLIC PANEL
Anion gap: 8 (ref 5–15)
BUN: 15 mg/dL (ref 6–20)
CO2: 22 mmol/L (ref 22–32)
CREATININE: 0.98 mg/dL (ref 0.61–1.24)
Calcium: 8.4 mg/dL — ABNORMAL LOW (ref 8.9–10.3)
Chloride: 105 mmol/L (ref 101–111)
GFR calc Af Amer: >60 mL/min (ref >60)
Glucose, Bld: 189 mg/dL — ABNORMAL HIGH (ref 65–99)
POTASSIUM: 4.1 mmol/L (ref 3.5–5.1)
Sodium: 135 mmol/L (ref 135–145)

## 2014-06-13 LAB — CBC
HCT: 36.3 % — ABNORMAL LOW (ref 39.0–52.0)
Hemoglobin: 12.3 g/dL — ABNORMAL LOW (ref 13.0–17.0)
MCH: 32.6 pg (ref 26.0–34.0)
MCHC: 33.9 g/dL (ref 30.0–36.0)
MCV: 96.3 fL (ref 78.0–100.0)
Platelets: 185 K/uL (ref 150–400)
RBC: 3.77 MIL/uL — ABNORMAL LOW (ref 4.22–5.81)
RDW: 15 % (ref 11.5–15.5)
WBC: 10.7 K/uL — ABNORMAL HIGH (ref 4.0–10.5)

## 2014-06-13 NOTE — Op Note (Signed)
Harold Jennings, Harold Jennings               ACCOUNT NO.:  [id number]  MEDICAL RECORD NO.:  [id number]  LOCATION:  1749                         FACILITY:  [facility]  PHYSICIAN:  Earnstine Regal, MD      DATE OF BIRTH:  1932/07/02  DATE OF PROCEDURE:  06/12/2014                              OPERATIVE REPORT   PREOPERATIVE DIAGNOSES: 1. Colovesical fistula secondary to diverticular disease. 2. Incarcerated umbilical hernia.  POSTOPERATIVE DIAGNOSES: 1. Colovesical fistula secondary to diverticular disease. 2. Incarcerated umbilical hernia.  PROCEDURE: 1. Exploratory laparotomy. 2. Sigmoid colectomy. 3. Takedown of colovesical fistula with cystorrhaphy. 4. Primary repair of incarcerated umbilical hernia.  SURGEON:  Earnstine Regal, MD, FACS  CO-SURGEON:  Lillette Boxer. Diona Fanti, M.D.  ASSISTANT:  Marland Kitchen T. Excell Seltzer, MD, FACS  ANESTHESIA:  General per Dr. Myrtie Soman.  ESTIMATED BLOOD LOSS:  Minimal.  PREPARATION:  ChloraPrep and Betadine.  COMPLICATIONS:  None.  INDICATIONS:  The patient is an 81 year old male who presented with acute diverticulitis and abscess.  He was treated with intravenous antibiotics and bowel rest.  The patient subsequently developed a colovesical fistula and persistent small abscess.  He was treated with prolonged courses of antibiotics.  After evaluation by Cardiology, Urology, Primary Care, and General Surgery, the patient is prepared for the operating room for definitive resection.  BODY OF REPORT:  Procedure was done at [place] [facility] at [facility].  The patient was brought to the operating room, placed in supine position on the operating room table.  Following administration of general anesthesia, the patient was positioned in lithotomy and prepped and draped in the usual aseptic fashion.  After ascertaining that an adequate level of anesthesia had been achieved, midline abdominal incision was made with a #10 blade.  Dissection was  carried through subcutaneous tissues.  Fascia was incised in the midline and the peritoneal cavity was entered cautiously.  Abdomen was briefly explored and Balfour retractor was placed for exposure.  There was relatively focal diverticular disease involving the mid to distal sigmoid colon. The sigmoid colon was densely adherent to the dome of the bladder. Using the electrocautery, the distal descending and proximal sigmoid colon was mobilized from its lateral peritoneal attachments.  The sigmoid colon was then dissected away from the bladder using the electrocautery for hemostasis.  There is a fistulous connection with a small approximately 2.5 cm abscess, which was opened and evacuated. Sigmoid colon was then completely mobilized away from the bladder.  Dr. Franchot Gallo then instilled saline in the bladder until it was moderately distended under tension.  There did not appear to be any significant leakage through the fistula.  Dr. Diona Fanti then closed the area of the fistula with interrupted Vicryl sutures and elevated peritoneal flap which he placed over the bladder repair and secured in place with interrupted 3-0 Vicryl sutures.  This portion of the operation will be dictated under separate operative note.  At this point, we continued to mobilize the sigmoid colon.  The sigmoid mesentery was fully elevated.  The distal sigmoid colon appeared relatively normal without inflammatory changes.  The rectum was normal. The distal descending colon was normal.  A point in the distal  descending colon at the junction with the proximal sigmoid colon was selected and transected between bowel clamps.  Colonic mesentery was divided with the LigaSure.  Dissection was carried distal to the diverticular disease and the site of fistula.  A point approximately 10 cm above the peritoneal reflection was selected.  Bowel appeared healthy with no further diverticula distally.  Bowel was  again transected between bowel clamps.  Remainder of the mesentery was divided with the LigaSure and the specimen was removed from the abdomen.  Suture was used to mark the distal extent of the bowel.  It was submitted to Pathology for review.  Next, an end-to-end anastomosis was created between the distal descending colon and distal sigmoid colon.  This was performed in a single layer with interrupted 3-0 silk sutures.  There was no tension on the anastomosis.  The bowel appeared viable and well vascularized. There was no sign of further diverticular disease.  The abdomen was then copiously irrigated with warm saline which was evacuated.  Good hemostasis was noted throughout the operative field.  Using the colon protocol, all instruments were removed.  New drapes were applied.  New gowns and gloves were donned and the procedure was continued.  Excess skin at the level of the umbilical hernia was excised.  Fascial defect was opened and fascial edges were freshened with the electrocautery.  The midline incision was then closed with interrupted #1 Novafil simple sutures.  Subcutaneous tissues were irrigated.  Skin was closed with stainless steel staples.  Sterile honeycomb dressing was applied.  The patient was awakened from anesthesia and brought to the recovery room.  The patient tolerated the procedure well.   Earnstine Regal, MD, [facility] Office: [phone]    TMG/MEDQ  D:  06/12/2014  T:  06/13/2014  Job:  [id number]  cc:   Juanda Bond. [address], [place], [place] [address]  Dr. Garth Bigness. Dahlstedt, M.D. Fax: [phone]

## 2014-06-13 NOTE — Progress Notes (Signed)
Patient ID: Harold Jennings, male   DOB: 25-Oct-1932, 81 y.o.   MRN: [id number] 1 Day Post-Op  Subjective: A lot of nausea last night but this is improved this morning. Good pain control. Up in chair. No other complaints.  Objective: Vital signs in last 24 hours: Temp:  [97.2 F (36.2 C)-97.6 F (36.4 C)] 97.6 F (36.4 C) (05/28 0431) Pulse Rate:  [72-94] 85 (05/28 0700) Resp:  [10-19] 14 (05/28 0752) BP: (135-143)/(78-101) 143/78 mmHg (05/27 1300) SpO2:  [90 %-100 %] 95 % (05/28 0752) Arterial Line BP: (144-153)/(76-84) 145/79 mmHg (05/27 1212) Weight:  [93.4 kg (205 lb 14.6 oz)] 93.4 kg (205 lb 14.6 oz) (05/27 1800) Last BM Date:  (PTA)  Intake/Output from previous day: 05/27 0701 - 05/28 0700 In: 4527.5 [I.V.:4527.5] Out: 820 [Urine:620; Emesis/NG output:150; Blood:50] Intake/Output this shift:    General appearance: alert, cooperative and no distress Resp: clear to auscultation bilaterally GI: normal findings: soft, non-tender Incision/Wound: clean and dry  Lab Results:   Recent Labs  06/12/14 1600 06/13/14 0415  WBC 9.1 10.7*  HGB 13.1 12.3*  HCT 37.5* 36.3*  PLT 176 185   BMET  Recent Labs  06/10/14 1330 06/12/14 1600 06/13/14 0415  NA 139  --  135  K 3.7  --  4.1  CL 102  --  105  CO2 28  --  22  GLUCOSE 154*  --  189*  BUN 21*  --  15  CREATININE 1.13 0.99 0.98  CALCIUM 9.1  --  8.4*     Studies/Results: No results found.  Anti-infectives: Anti-infectives    Start     Dose/Rate Route Frequency Ordered Stop   06/12/14 0618  ertapenem (INVANZ) 1 g in sodium chloride 0.9 % 50 mL IVPB  Status:  Discontinued     1 g 100 mL/hr over 30 Minutes Intravenous On call to O.R. 06/12/14 0618 06/12/14 1240      Assessment/Plan: s/p Procedure(s): SIGMOID COLECTOMY AND TAKEDOWN OF COLOVESICAL FISTULA  INCARCERATED UMBILICAL HERNIA REPAIR   CYSTORRHAPHY Stable postoperatively without apparent complication. Due to nausea we will leave on ice chips only  today. Transfer to floor   LOS: 1 day    Mayana Irigoyen T 06/13/2014

## 2014-06-14 NOTE — Plan of Care (Signed)
Problem: Phase I Progression Outcomes Goal: Tubes/drains patent Outcome: Completed/Met Date Met:  06/14/14 Foley continues to drain cloudy urine. Bag below bladder.

## 2014-06-14 NOTE — Plan of Care (Signed)
Problem: Phase I Progression Outcomes Goal: Voiding-avoid urinary catheter unless indicated Outcome: Not Applicable Date Met:  49/61/16 Pt need catheter per MD order.

## 2014-06-14 NOTE — Progress Notes (Signed)
Pt refused to wear Bipap tonight when RN offered to phone RT to place it on. Educated pt on importance of it and pt still refused. Will continue to monitor.

## 2014-06-14 NOTE — Plan of Care (Signed)
Problem: Phase I Progression Outcomes Goal: Sutures/staples intact Outcome: Completed/Met Date Met:  06/14/14 Staples are intact as well as honeycomb dressing.

## 2014-06-14 NOTE — Progress Notes (Signed)
Patient ID: Harold Jennings, male   DOB: Nov 30, 1932, 81 y.o.   MRN: [id number] 2 Days Post-Op sigmoidectomy, repair of colovesicular fistula Subjective: Nausea better, tolerating clears.  No flatus or BM's yet.  Good pain control. Up in chair yesterday. No other complaints.  Objective: Vital signs in last 24 hours: Temp:  [97.5 F (36.4 C)-98.8 F (37.1 C)] 98.8 F (37.1 C) (05/29 0609) Pulse Rate:  [63-110] 89 (05/29 0609) Resp:  [10-22] 17 (05/29 0742) BP: (117-149)/(66-89) 125/83 mmHg (05/29 0609) SpO2:  [93 %-98 %] 96 % (05/29 0742) Last BM Date:  (PTA)  Intake/Output from previous day: 05/28 0701 - 05/29 0700 In: 1965.7 [P.O.:240; I.V.:1725.7] Out: 765 [Urine:765] Intake/Output this shift: Total I/O In: 261.3 [I.V.:261.3] Out: -   General appearance: alert, cooperative and no distress Resp: clear to auscultation bilaterally GI: normal findings: soft, non-tender Incision/Wound: clean and dry  Lab Results:   Recent Labs  06/12/14 1600 06/13/14 0415  WBC 9.1 10.7*  HGB 13.1 12.3*  HCT 37.5* 36.3*  PLT 176 185   BMET  Recent Labs  06/12/14 1600 06/13/14 0415  NA  --  135  K  --  4.1  CL  --  105  CO2  --  22  GLUCOSE  --  189*  BUN  --  15  CREATININE 0.99 0.98  CALCIUM  --  8.4*     Studies/Results: No results found.  Anti-infectives: Anti-infectives    Start     Dose/Rate Route Frequency Ordered Stop   06/12/14 0618  ertapenem (INVANZ) 1 g in sodium chloride 0.9 % 50 mL IVPB  Status:  Discontinued     1 g 100 mL/hr over 30 Minutes Intravenous On call to O.R. 06/12/14 0618 06/12/14 1240      Assessment/Plan: s/p Procedure(s): SIGMOID COLECTOMY AND TAKEDOWN OF COLOVESICAL FISTULA  INCARCERATED UMBILICAL HERNIA REPAIR   CYSTORRHAPHY Stable postoperatively without apparent complication.  Cont clears, gentle hydration Cont foley, due to bladder repair Ambulate, PT/OT consults   LOS: 2 days    Harold Jennings C. [id number]/0881

## 2014-06-14 NOTE — Progress Notes (Signed)
PT Cancellation Note  Patient Details Name: Harold Jennings MRN: [id number] DOB: 1932-05-24   Cancelled Treatment:    Reason Eval/Treat Not Completed: Patient declined, stated he had been on [facility], does not want to walk today. Will check back this PM as schedule permits.  Claretha Cooper 06/14/2014, 11:13 AM  Tresa Endo PT [phone]

## 2014-06-14 NOTE — Progress Notes (Signed)
Pt has removed BIPAP mask and placed self back on [employer].  Pt stated that he didn't want to use the BIPAP tonight.  RT to monitor and assess as needed.

## 2014-06-14 NOTE — Plan of Care (Signed)
Problem: Phase I Progression Outcomes Goal: Pain controlled with appropriate interventions Outcome: Completed/Met Date Met:  06/14/14 Pt reports adequate pain control with Dilaudid pca.

## 2014-06-14 NOTE — Progress Notes (Signed)
Pt refused nocturnal BiPAP.  Pt states that it leaks to much and he can't go to sleep with it on.  I offered to adjust the mask or try a different mask but Pt insisted on remaining on his nasal cannula with EtCO2 monitoring for the night.

## 2014-06-14 NOTE — Plan of Care (Signed)
Problem: Phase I Progression Outcomes Goal: Initial discharge plan identified Outcome: Progressing Pt discussed discharge goals; eating, ambulating, tolerating diet, passing gas. Will continue to monitor for these goals.

## 2014-06-14 NOTE — Progress Notes (Signed)
Patient ID: Harold Jennings, male   DOB: [birth year], 81 y.o.   MRN: [id number] 2 Days Post-Op  Subjective: Harold Jennings is 2 days postop from repair of a colovesical fistula.   He has no complaints regarding the foley which is draining well.  The urine is slightly cloudy.   ROS:  Review of Systems  Constitutional: Negative for fever.    Anti-infectives: Anti-infectives    Start     Dose/Rate Route Frequency Ordered Stop   06/12/14 0618  ertapenem (INVANZ) 1 g in sodium chloride 0.9 % 50 mL IVPB  Status:  Discontinued     1 g 100 mL/hr over 30 Minutes Intravenous On call to O.R. 06/12/14 0618 06/12/14 1240      Current Facility-Administered Medications  Medication Dose Route Frequency Provider Last Rate Last Dose  . antiseptic oral rinse (CPC / CETYLPYRIDINIUM CHLORIDE 0.05%) solution 7 mL  7 mL Mouth Rinse BID Armandina Gemma, MD   7 mL at 06/14/14 0948  . Chlorhexidine Gluconate Cloth 2 % PADS 6 each  6 each Topical Q0600 Armandina Gemma, MD   6 each at 06/14/14 [phone]  . dextrose 5 % and 0.45 % NaCl with KCl 20 mEq/L infusion   Intravenous Continuous Armandina Gemma, MD 75 mL/hr at 06/13/14 2300    . diphenhydrAMINE (BENADRYL) injection 12.5 mg  12.5 mg Intravenous Q6H PRN Armandina Gemma, MD       Or  . diphenhydrAMINE (BENADRYL) 12.5 MG/5ML elixir 12.5 mg  12.5 mg Oral Q6H PRN Armandina Gemma, MD      . enoxaparin (LOVENOX) injection 40 mg  40 mg Subcutaneous Q24H Armandina Gemma, MD   40 mg at 06/14/14 0949  . HYDROmorphone (DILAUDID) PCA injection 0.3 mg/mL   Intravenous 6 times per day Armandina Gemma, MD   0.2 mg at 06/12/14 1117  . metoprolol (LOPRESSOR) injection 5 mg  5 mg Intravenous 4 times per day Excell Seltzer, MD   5 mg at 06/14/14 0522  . mupirocin ointment (BACTROBAN) 2 % 1 application  1 application Nasal BID Armandina Gemma, MD   1 application at 59/56/38 0947  . naloxone [facility]) injection 0.4 mg  0.4 mg Intravenous PRN Armandina Gemma, MD       And  . sodium chloride 0.9 % injection 9 mL  9 mL  Intravenous PRN Armandina Gemma, MD      . ondansetron (ZOFRAN) injection 4 mg  4 mg Intravenous Q6H PRN Armandina Gemma, MD      . ondansetron [facility]) tablet 4 mg  4 mg Oral Q6H PRN Armandina Gemma, MD       Or  . ondansetron [facility]) injection 4 mg  4 mg Intravenous Q6H PRN Armandina Gemma, MD   4 mg at 06/12/14 2115  . promethazine (PHENERGAN) injection 12.5 mg  12.5 mg Intravenous Q4H PRN Excell Seltzer, MD   12.5 mg at 06/12/14 2334     Objective: Vital signs in last 24 hours: Temp:  [97.5 F (36.4 C)-98.8 F (37.1 C)] 98.8 F (37.1 C) (05/29 0609) Pulse Rate:  [63-110] 89 (05/29 0609) Resp:  [10-22] 17 (05/29 0742) BP: (117-149)/(66-89) 125/83 mmHg (05/29 0609) SpO2:  [93 %-98 %] 96 % (05/29 0742)  Intake/Output from previous day: 05/28 0701 - 05/29 0700 In: 1965.7 [P.O.:240; I.V.:1725.7] Out: 765 [Urine:765] Intake/Output this shift: Total I/O In: 261.3 [I.V.:261.3] Out: -    Physical Exam  Constitutional: He is well-developed, well-nourished, and in no distress.  Genitourinary:  Foley is draining well  Lab Results:   Recent Labs  06/12/14 1600 06/13/14 0415  WBC 9.1 10.7*  HGB 13.1 12.3*  HCT 37.5* 36.3*  PLT 176 185   BMET  Recent Labs  06/12/14 1600 06/13/14 0415  NA  --  135  K  --  4.1  CL  --  105  CO2  --  22  GLUCOSE  --  189*  BUN  --  15  CREATININE 0.99 0.98  CALCIUM  --  8.4*   PT/INR No results for input(s): LABPROT, INR in the last 72 hours. ABG No results for input(s): PHART, HCO3 in the last 72 hours.  Invalid input(s): PCO2, PO2  Studies/Results: No results found.   Assessment: s/p Procedure(s): SIGMOID COLECTOMY AND TAKEDOWN OF COLOVESICAL FISTULA  INCARCERATED UMBILICAL HERNIA REPAIR   CYSTORRHAPHY  Plan: Continue foley drainage.     LOS: 2 days    Harold Jennings 06/14/2014

## 2014-06-14 NOTE — Plan of Care (Signed)
Problem: Phase II Progression Outcomes Goal: Return of bowel function (flatus, BM) IF ABDOMINAL SURGERY:  Outcome: Completed/Met Date Met:  06/14/14 Positive for flatus. Goal: Foley discontinued Outcome: Not Applicable Date Met:  68/11/57 Per MD's order, leave foley in place.

## 2014-06-14 NOTE — Plan of Care (Signed)
Problem: Phase I Progression Outcomes Goal: Vital signs/hemodynamically stable Outcome: Completed/Met Date Met:  06/14/14 Pt's vs have remained stable.

## 2014-06-14 NOTE — Plan of Care (Signed)
Problem: Phase I Progression Outcomes Goal: Incision/dressings dry and intact Outcome: Completed/Met Date Met:  06/14/14 Pt's dressing is clean/dry/intact and free from s/s of infection.

## 2014-06-14 NOTE — Plan of Care (Signed)
Problem: Phase I Progression Outcomes Goal: OOB as tolerated unless otherwise ordered Outcome: Completed/Met Date Met:  06/14/14 Pt OOB to [facility] w/assist x2. MD placed order for PT/OT consult.

## 2014-06-14 NOTE — Progress Notes (Signed)
Initial Nutrition Assessment  DOCUMENTATION CODES:  Obesity unspecified  INTERVENTION:  Boost Breeze TID RD to follow-up for supplemental acceptance  NUTRITION DIAGNOSIS:  Inadequate oral intake related to inability to eat as evidenced by  (clear liquid diet).  GOAL:  Patient will meet greater than or equal to 90% of their needs  MONITOR:  PO intake, Diet advancement, Supplement acceptance, Labs, Weight trends, Skin, I & O's  REASON FOR ASSESSMENT:  Malnutrition Screening Tool    ASSESSMENT: 81 year old male presents for elective colectomy/cystorrhaphy for treatment of a colovesical fistula secondary to acute diverticulitis and a pericolonic abscess.  S/p 5/28 Procedure(s): SIGMOID COLECTOMY AND TAKEDOWN OF COLOVESICAL FISTULA (N/A) INCARCERATED UMBILICAL HERNIA REPAIR (N/A) CYSTORRHAPHY (N/A)  Pt on clear liquid diet d/t continuous nausea. S/p exp lap. Pt has had 25 lb weight loss since 11/24 (10% weight loss x 6 months, significant for time frame).  RD to order [employer] supplement. Will follow-up for patient acceptance and when diet is advanced.  Labs reviewed:  Glucose 189  Height:  Ht Readings from Last 1 Encounters:  06/12/14 5\' 5"  (1.651 m)    Weight:  Wt Readings from Last 1 Encounters:  06/12/14 205 lb 14.6 oz (93.4 kg)    Ideal Body Weight:  61.8 kg  Wt Readings from Last 10 Encounters:  06/12/14 205 lb 14.6 oz (93.4 kg)  05/20/14 201 lb 12.8 oz (91.536 kg)  05/05/14 205 lb 1.9 oz (93.042 kg)  04/11/14 205 lb 7.5 oz (93.2 kg)  01/17/14 214 lb 8.1 oz (97.3 kg)  12/09/13 227 lb 12.8 oz (103.329 kg)  11/04/13 224 lb (101.606 kg)  09/04/13 225 lb (102.059 kg)  09/02/13 225 lb 3.2 oz (102.15 kg)  07/21/13 237 lb 9.6 oz (107.775 kg)    BMI:  Body mass index is 34.27 kg/(m^2).  Estimated Nutritional Needs:  Kcal:  1700-1900  Protein:  75-85g  Fluid:  1.7L/day     Skin:  Wound (see comment) (abdominal incision)  Diet  Order:  Diet clear liquid Room service appropriate?: Yes; Fluid consistency:: Thin  EDUCATION NEEDS:  No education needs identified at this time   Intake/Output Summary (Last 24 hours) at 06/14/14 1346 Last data filed at 06/14/14 0929  Gross per 24 hour  Intake 1776.92 ml  Output    575 ml  Net 1201.92 ml    Last BM:  PTA  Clayton Bibles, MS, RD, LDN Pager: [phone] After Hours Pager: [phone]

## 2014-06-15 ENCOUNTER — Inpatient Hospital Stay (HOSPITAL_COMMUNITY): Payer: Medicare Other

## 2014-06-15 LAB — BASIC METABOLIC PANEL
ANION GAP: 11 (ref 5–15)
BUN: 14 mg/dL (ref 6–20)
CO2: 25 mmol/L (ref 22–32)
Calcium: 8.8 mg/dL — ABNORMAL LOW (ref 8.9–10.3)
Chloride: 102 mmol/L (ref 101–111)
Creatinine, Ser: 0.94 mg/dL (ref 0.61–1.24)
GFR calc non Af Amer: >60 mL/min (ref >60)
GLUCOSE: 162 mg/dL — AB (ref 65–99)
POTASSIUM: 3.6 mmol/L (ref 3.5–5.1)
Sodium: 138 mmol/L (ref 135–145)

## 2014-06-15 LAB — CBC
HCT: 45.1 % (ref 39.0–52.0)
Hemoglobin: 15.9 g/dL (ref 13.0–17.0)
MCH: 34.1 pg — ABNORMAL HIGH (ref 26.0–34.0)
MCHC: 35.3 g/dL (ref 30.0–36.0)
MCV: 96.8 fL (ref 78.0–100.0)
PLATELETS: 194 10*3/uL (ref 150–400)
RBC: 4.66 MIL/uL (ref 4.22–5.81)
RDW: 15.1 % (ref 11.5–15.5)
WBC: 12.4 10*3/uL — ABNORMAL HIGH (ref 4.0–10.5)

## 2014-06-15 MED ORDER — SODIUM CHLORIDE 0.9 % IV BOLUS (SEPSIS)
500.0000 mL | Freq: Once | INTRAVENOUS | Status: AC
  Administered 2014-06-15: 500 mL via INTRAVENOUS

## 2014-06-15 MED ORDER — METOPROLOL TARTRATE 1 MG/ML IV SOLN
10.0000 mg | Freq: Four times a day (QID) | INTRAVENOUS | Status: DC
  Administered 2014-06-16 – 2014-06-18 (×10): 10 mg via INTRAVENOUS
  Filled 2014-06-15 (×15): qty 10

## 2014-06-15 NOTE — Progress Notes (Signed)
Patient ID: Harold Jennings, male   DOB: 1932/09/17, 81 y.o.   MRN: [id number] 3 Days Post-Op sigmoidectomy, repair of colovesicular fistula Subjective: Had some vomiting last night (nonbilious)  Having flatus and BM's per pt, not having much control though.  Good pain control. Up in chair today, hasn't ambulated much yet.   Objective: Vital signs in last 24 hours: Temp:  [97.3 F (36.3 C)-97.9 F (36.6 C)] 97.9 F (36.6 C) (05/30 0515) Pulse Rate:  [87-115] 87 (05/30 0515) Resp:  [14-18] 18 (05/30 0515) BP: (120-160)/(68-110) 144/73 mmHg (05/30 0515) SpO2:  [93 %-96 %] 93 % (05/30 0515) Last BM Date: 06/15/14  Intake/Output from previous day: 05/29 0701 - 05/30 0700 In: 1800 [I.V.:1800] Out: 1025 [Urine:1025] Intake/Output this shift:    General appearance: alert, cooperative and no distress Resp: clear to auscultation bilaterally GI: normal findings: soft, non-tender Incision/Wound: clean and dry  Lab Results:   Recent Labs  06/13/14 0415 06/15/14 0405  WBC 10.7* 12.4*  HGB 12.3* 15.9  HCT 36.3* 45.1  PLT 185 194   BMET  Recent Labs  06/13/14 0415 06/15/14 0405  NA 135 138  K 4.1 3.6  CL 105 102  CO2 22 25  GLUCOSE 189* 162*  BUN 15 14  CREATININE 0.98 0.94  CALCIUM 8.4* 8.8*     Studies/Results: No results found.  Anti-infectives: Anti-infectives    Start     Dose/Rate Route Frequency Ordered Stop   06/12/14 0618  ertapenem (INVANZ) 1 g in sodium chloride 0.9 % 50 mL IVPB  Status:  Discontinued     1 g 100 mL/hr over 30 Minutes Intravenous On call to O.R. 06/12/14 0618 06/12/14 1240      Assessment/Plan: s/p Procedure(s): SIGMOID COLECTOMY AND TAKEDOWN OF COLOVESICAL FISTULA  INCARCERATED UMBILICAL HERNIA REPAIR   CYSTORRHAPHY Stable postoperatively without apparent complication.  Bowel function returning Given nausea and vomiting, we will just cont clears with gentle hydration today Cont foley, due to bladder repair Ambulate, PT/OT  consults   LOS: 3 days    Briar Sword C. 1/57/2620

## 2014-06-15 NOTE — Progress Notes (Signed)
Spoke with pt regarding nocturnal bipap.  Pt stated he has tried it previously, but the mask leaks too much and can't sleep with it on.  RT offered to readjust mask, but he still declined.  Pt prefers to wear [place] with etco2 monitoring.  RN aware

## 2014-06-15 NOTE — Progress Notes (Signed)
OT Cancellation Note  Patient Details Name: Harold Jennings MRN: [id number] DOB: 1932/03/03   Cancelled Treatment:    Reason Eval/Treat Not Completed: Patient having test in room. Will recheck on pt later this day or next day.   Mickel Baas [place], [place] 06/15/2014, 1:53 PM

## 2014-06-15 NOTE — Progress Notes (Signed)
Patient foley flushed with 30 cc normal saline per MD order.  Saline flush returned with urine without difficulty when foley bag reconnected

## 2014-06-15 NOTE — Progress Notes (Signed)
EKG results given to Dr Marcello Moores via telephone, report for CXR read.  Orders received.  Will continue to monitor urinary output

## 2014-06-15 NOTE — Progress Notes (Signed)
Pt given NS 523ml bolus and IVF increased to 142ml per MD orders. sw

## 2014-06-15 NOTE — Progress Notes (Signed)
Pt had several episodes of nausea and vomiting. His emesis looked clear.  It was approx. 1/2 cup of emesis total. Administered Zofran first, but he obtained no relief. I then gave Phenergan, and that seemed to help. Will continue to monitor.

## 2014-06-15 NOTE — Evaluation (Signed)
Physical Therapy Evaluation Patient Details Name: Harold Jennings MRN: [id number] DOB: May 15, 1932 Today's Date: 06/15/2014   History of Present Illness  81 year old male presents for elective colectomy/cystorrhaphy for treatment of a colovesical fistula secondary to acute diverticulitis and a pericolonic abscess.  H/o HTN, CAD, obesity, cardiomyopathy, CHF, a.fib on xarelto, sleep apnea.  Clinical Impression  Pt admitted with above diagnosis. Pt currently with functional limitations due to the deficits listed below (see PT Problem List). Pt ambulated 160' with RW without loss of balance, HR 98 with walking. At rest prior to walking HR varied from 105-130, RN notified.  Pt will benefit from skilled PT to increase their independence and safety with mobility to allow discharge to the venue listed below.       Follow Up Recommendations Home health PT    Equipment Recommendations  None recommended by PT    Recommendations for Other Services       Precautions / Restrictions Precautions Precautions: Fall Precaution Comments: abdominal incision Restrictions Weight Bearing Restrictions: No      Mobility  Bed Mobility Overal bed mobility: Needs Assistance Bed Mobility: Supine to Sit;Rolling Rolling: Min assist   Supine to sit: Mod assist     General bed mobility comments: Mod A to raise trunk, instructed pt in log roll, used rail  Transfers Overall transfer level: Needs assistance Equipment used: Rolling walker (2 wheeled) Transfers: Sit to/from Stand Sit to Stand: Min guard         General transfer comment: cues for hand placement  Ambulation/Gait Ambulation/Gait assistance: Supervision Ambulation Distance (Feet): 160 Feet Assistive device: Rolling walker (2 wheeled) Gait Pattern/deviations: Step-through pattern   Gait velocity interpretation: Below normal speed for age/gender    Stairs            Wheelchair Mobility    Modified Rankin (Stroke Patients  Only)       Balance Overall balance assessment: Needs assistance   Sitting balance-[name] Scale: Good       Standing balance-[name] Scale: Fair                               Pertinent Vitals/Pain Pain Assessment: No/denies pain    Home Living Family/patient expects to be discharged to:: Private residence Living Arrangements: Spouse/significant other Available Help at Discharge: Available PRN/intermittently Type of Home: House Home Access: Stairs to enter Entrance Stairs-Rails: Psychiatric nurse of Steps: 3 + 3   Home Equipment: Walker - 4 wheels;Cane - single point;Bedside commode      Prior Function Level of Independence: Independent         Comments: uses cane or rollator     Hand Dominance        Extremity/Trunk Assessment   Upper Extremity Assessment: Overall WFL for tasks assessed           Lower Extremity Assessment: Overall WFL for tasks assessed      Cervical / Trunk Assessment: Normal  Communication   Communication: No difficulties  Cognition Arousal/Alertness: Awake/alert Behavior During Therapy: WFL for tasks assessed/performed Overall Cognitive Status: Within Functional Limits for tasks assessed                      General Comments      Exercises        Assessment/Plan    PT Assessment Patient needs continued PT services  PT Diagnosis Generalized weakness   PT Problem List Decreased  activity tolerance;Decreased mobility;Decreased knowledge of use of DME  PT Treatment Interventions DME instruction;Gait training;Stair training;Functional mobility training;Therapeutic activities;Patient/family education;Therapeutic exercise   PT Goals (Current goals can be found in the Care Plan section) Acute Rehab PT Goals Patient Stated Goal: to be able to exercise, to go fishing PT Goal Formulation: With patient Time For Goal Achievement: 06/22/14 Potential to Achieve Goals: Good    Frequency Min  3X/week   Barriers to discharge        Co-evaluation               End of Session   Activity Tolerance: Patient tolerated treatment well Patient left: in chair;with call bell/phone within reach;with family/visitor present Nurse Communication: Mobility status         Time: 1130-1206 PT Time Calculation (min) (ACUTE ONLY): 36 min   Charges:   PT Evaluation $Initial PT Evaluation Tier I: 1 Procedure PT Treatments $Gait Training: 8-22 mins   PT G Codes:        [name] 06/15/2014, 12:25 PM [phone]

## 2014-06-15 NOTE — Progress Notes (Signed)
Pt c/o that the PCA alarms were beeping and keeping him from sleeping. While on the PCA and trying to sleep his respirations dropped to 8 per minute. I advised pt that he if he wore his Cpap because he has sleep apnea, he would not set off the alarms. He then told me he was not in pain unless tried to get out of bed, and he did not the PCA while his was trying to sleep. He asked me to turn the machine off, so I did. I advised pt that is he started to have pain to notify me so I could turn the PCA back on and hook him back up to the monitor. He agreed.

## 2014-06-15 NOTE — Progress Notes (Addendum)
Discussed with Dr Marcello Moores patient uop 225 for 12 hr shift and 75 cc since normal saline bolus.  Orders received for additional NS bolus.  Lopressor IV increased. Heart rate currently 104

## 2014-06-15 NOTE — Progress Notes (Signed)
MD paged regarding pt's hr, uop and mild confusion. Informed Dr. Marcello Moores of pt's sporadic heart ranging from 80-120 at rest, with irregular beat, uop 160ml since 0900, bp 134/66, pt has mild confusion (phenergan during night shift).  Verbal orders received from Dr. Marcello Moores. sw

## 2014-06-16 ENCOUNTER — Encounter (HOSPITAL_COMMUNITY): Payer: Self-pay | Admitting: Surgery

## 2014-06-16 DIAGNOSIS — I482 Chronic atrial fibrillation: Secondary | ICD-10-CM

## 2014-06-16 MED ORDER — MORPHINE SULFATE 2 MG/ML IJ SOLN: 1.0000 mg | INTRAMUSCULAR | Status: DC | PRN

## 2014-06-16 MED ORDER — HYDROCODONE-ACETAMINOPHEN 5-325 MG PO TABS: 1.0000 | ORAL_TABLET | ORAL | Status: DC | PRN

## 2014-06-16 NOTE — Progress Notes (Signed)
Pt refused nocturnal bipap.  Pt has not wore it for several nights.  Pt stated he is not going to wear it due to mask leaks.  Therefore, machine removed from room.  Pt was ok with this as he does not want to be charged.  Pt was advised that RT is available all night should he change his mind.

## 2014-06-16 NOTE — Progress Notes (Signed)
Attempted to talk with patient twice, on the phone then sleeping soundly. Per nurse, very tired and did not sleep well overnight. Left HH list for choice if agrees to HHPT recommendation. Will f/u with patient or spouse if needed.

## 2014-06-16 NOTE — Consult Note (Signed)
CARDIOLOGY CONSULT NOTE  Patient ID: Harold Jennings, MRN: [id number], DOB/AGE: 09/02/1932 81 y.o. Admit date: 06/12/2014 Date of Consult: 06/16/2014  Primary Physician: London Pepper, MD Primary Cardiologist: Burt Knack Referring Physician: Dr Harlow Asa  Chief Complaint: Atrial fibrillation Reason for Consultation: Atrial fibrillation  HPI: 81 yo male well-known to me with chronic atrial fibrillation, chf, and CAD. He underwent sigmoidectomy and repair of colovesicular fistula 5/27 by Dr Harlow Asa and Dr Diona Fanti.   The patient is now POD #4 and is making slow, steady progress. C/O a lot of gas, belching. No CP, shortness of breath, or palpitations. He is taking some clears but no pills or solid foods yet. HR has been low 80-118 on average.   Medical History:  Past Medical History  Diagnosis Date  . Dyslipidemia   . HTN (hypertension)   . Obesity   . Increased liver enzymes     felt to be a fatty liver per ultrasound in 2011  . Dyspnea   . Ischemic cardiomyopathy   . Spinal stenosis   . Sleep apnea     bipap   . CAD (coronary artery disease)     Silent MI sometime prior to 2000. S/P cath in 2000 showing a total mid RCA with collaterals from the LCX. EF is 40 to50%; Last Myoview in 2010 showing inferior scar without ischemia. EF was 52%.  Marland Kitchen Dysrhythmia     atrial fib   . Edema     lower extremity   . Cancer     skin cancer   . Anemia   . Diverticular disease dec 2015  . Umbilical hernia       Surgical History:  Past Surgical History  Procedure Laterality Date  . Total hip arthroplasty    . Eye surgery    . Lower back surgery      spinal stenosis  . Shoulder surgery    . Cardioversion N/A 06/06/2012    Procedure: CARDIOVERSION;  Surgeon: Thayer Headings, MD;  Location: [place];  Service: Cardiovascular;  Laterality: N/A;  . Cardiac catheterization    . Partial colectomy N/A 06/12/2014    Procedure: SIGMOID COLECTOMY AND TAKEDOWN OF COLOVESICAL FISTULA ;  Surgeon: Armandina Gemma, MD;  Location: [facility];  Service: General;  Laterality: N/A;  . Umbilical hernia repair N/A 06/12/2014    Procedure: INCARCERATED UMBILICAL HERNIA REPAIR  ;  Surgeon: Armandina Gemma, MD;  Location: [facility];  Service: General;  Laterality: N/A;  . Cystostomy N/A 06/12/2014    Procedure: Jasmine December;  Surgeon: Franchot Gallo, MD;  Location: [facility];  Service: Urology;  Laterality: N/A;     Home Meds: Prior to Admission medications   Medication Sig Start Date End Date Taking? Authorizing Provider  ciprofloxacin (CIPRO) 500 MG tablet Take 1 tablet (500 mg total) by mouth 2 (two) times daily. Patient not taking: Reported on 06/09/2014 04/11/14  Yes Merriam Brandner Boston, MD  diphenhydrAMINE (BENADRYL) 25 mg capsule Take 25 mg by mouth at bedtime.    Yes Historical Provider, MD  doxazosin (CARDURA) 8 MG tablet Take 8 mg by mouth at bedtime.   Yes Historical Provider, MD  ezetimibe (ZETIA) 10 MG tablet Take 10 mg by mouth every morning.    Yes Historical Provider, MD  furosemide (LASIX) 40 MG tablet Take 40 mg by mouth as needed for fluid or edema (fluid).  04/29/12  Yes Sherren Mocha, MD  ibuprofen (ADVIL,MOTRIN) 200 MG tablet Take 400 mg by mouth every 6 (six) hours  as needed for headache or moderate pain.   Yes Historical Provider, MD  lactose free nutrition (BOOST) LIQD Take 237 mLs by mouth daily at 12 noon.   Yes Historical Provider, MD  levothyroxine (SYNTHROID, LEVOTHROID) 100 MCG tablet Take 100 mcg by mouth daily. 09/20/13  Yes Historical Provider, MD  loperamide (IMODIUM) 1 MG/5ML solution Take 10 mLs (2 mg total) by mouth every 6 (six) hours as needed for diarrhea or loose stools. 01/18/14  Yes Robbie Lis, MD  metoprolol succinate (TOPROL-XL) 50 MG 24 hr tablet Take 50 mg by mouth every morning. Take with or immediately following a meal.   Yes Historical Provider, MD  Multiple Vitamin (MULTIVITAMIN) tablet Take 1 tablet by mouth every morning.    Yes Historical Provider, MD  Rivaroxaban (XARELTO)  15 MG TABS tablet Take 1 tablet (15 mg total) by mouth daily with supper. 01/18/14  Yes Robbie Lis, MD  saccharomyces boulardii (FLORASTOR) 250 MG capsule Take 250 mg by mouth every morning.    Yes Historical Provider, MD  finasteride (PROSCAR) 5 MG tablet Take 5 mg by mouth daily.    Historical Provider, MD  metroNIDAZOLE (FLAGYL) 500 MG tablet Take 1 tablet (500 mg total) by mouth 3 (three) times daily. Patient not taking: Reported on 06/08/2014 04/11/14   Sundance Moise Boston, MD    Inpatient Medications:  . antiseptic oral rinse  7 mL Mouth Rinse BID  . enoxaparin (LOVENOX) injection  40 mg Subcutaneous Q24H  . metoprolol  10 mg Intravenous 4 times per day  . mupirocin ointment  1 application Nasal BID   . dextrose 5 % and 0.45 % NaCl with KCl 20 mEq/L 100 mL/hr at 06/16/14 1747    Allergies: No Known Allergies  History   Social History  . Marital Status: Married    Spouse Name: N/A  . Number of Children: 2  . Years of Education: N/A   Occupational History  . retired    Social History Main Topics  . Smoking status: Never Smoker   . Smokeless tobacco: Never Used  . Alcohol Use: Yes     Comment: 2 glasses of wine daily   . Drug Use: No  . Sexual Activity: No   Other Topics Concern  . Not on file   Social History Narrative     Family History  Problem Relation Age of Onset  . Stroke Father   . Heart attack Father   . Hypertension Father   . Diabetes Father   . Drug abuse Mother   . Hypertension Brother   . Hypertension Brother   . Hypertension Brother   . Hypertension Sister      Review of Systems: General: negative for chills, fever, night sweats. positive for weight loss 70# ENT: negative for rhinorrhea or epistaxis Cardiovascular: see HPI Dermatological: negative for rash Respiratory: positive for cough GI: positive for expected abdominal discomfort GU: no hematuria, urgency, or frequency Neurologic: negative for visual changes, syncope, headache, or  dizziness Heme: no easy bruising or bleeding Endo: negative for excessive thirst, thyroid disorder, or flushing Musculoskeletal: negative for joint pain or swelling, negative for myalgias  All other systems reviewed and are otherwise negative except as noted above.  Physical Exam: Blood pressure 124/82, pulse 57, temperature 98.5 F (36.9 C), temperature source Oral, resp. rate 18, height 5\' 5"  (1.651 m), weight 205 lb 14.6 oz (93.4 kg), SpO2 98 %. Pt is alert and oriented, WD, WN, pleasant elderly male in no distress. HEENT:  normal Neck: JVP normal. Carotid upstrokes normal without bruits. No thyromegaly. Lungs: equal expansion, clear bilaterally CV: Apex is discrete and nondisplaced, irregularly irregular without murmur or gallop Abd: soft, NT Back: no CVA tenderness Ext: no C/C/E Skin: warm and dry without rash Neuro: CNII-XII intact             Strength intact = bilaterally    Labs: No results for input(s): CKTOTAL, CKMB, TROPONINI in the last 72 hours. Lab Results  Component Value Date   WBC 12.4* 06/15/2014   HGB 15.9 06/15/2014   HCT 45.1 06/15/2014   MCV 96.8 06/15/2014   PLT 194 06/15/2014    Recent Labs Lab 06/15/14 0405  NA 138  K 3.6  CL 102  CO2 25  BUN 14  CREATININE 0.94  CALCIUM 8.8*  GLUCOSE 162*   Lab Results  Component Value Date   CHOL 183 01/04/2009   HDL 39.80 01/04/2009   LDLCALC 120* 01/04/2009   TRIG 116.0 01/04/2009   No results found for: DDIMER  Radiology/Studies:  Dg Chest Port 1 View  06/15/2014   CLINICAL DATA:  Shortness of breath and chest tightness. Three days post sigmoid colectomy, colovesical fistula repair and incarcerated umbilical hernia repair.  EXAM: PORTABLE CHEST - 1 VIEW  COMPARISON:  04/08/2014  FINDINGS: Bibasilar atelectasis present. There may be a small amount of left pleural fluid. There is no evidence of pulmonary edema, consolidation, pneumothorax, nodule or pleural fluid. Stable mild cardiomegaly.   IMPRESSION: Bibasilar atelectasis and potential small left pleural effusion.   Electronically Signed   By: Aletta Edouard M.D.   On: 06/15/2014 14:17    EKG: atrial fib with RVR, RBBB, HR 110 bpm. No change from baseline tracings  Cardiac Studies: 2D Echo 8.26.2015: Study Conclusions  - Left ventricle: The cavity size was mildly dilated. There was mild concentric hypertrophy. Systolic function was mildly to moderately reduced. The estimated ejection fraction was in the range of 40% to 45%. Wall motion was normal; there were no regional wall motion abnormalities. The study was not technically sufficient to allow evaluation of LV diastolic dysfunction due to atrial fibrillation. - Aortic valve: Trileaflet; moderately thickened, moderately calcified leaflets. Cusp separation was mildly reduced. There was mild stenosis. There was mild regurgitation. - Aortic root: The aortic root was normal in size. - Mitral valve: There was mild to moderate regurgitation directed centrally. - Left atrium: The atrium was moderately to severely dilated. - Right ventricle: The cavity size was mildly dilated. Wall thickness was normal. Systolic function was normal. - Right atrium: The atrium was mildly dilated. - Tricuspid valve: There was mild regurgitation. - Pulmonic valve: There was mild regurgitation. - Pulmonary arteries: Systolic pressure was within the normal range. PA peak pressure: 31 mm Hg (S). - Pericardium, extracardiac: There was no pericardial effusion.  Impressions:  - Compared to the prior study in 2013 the left ventricle is now mildly dilated with mildly impaired systolic function, LVEF 94-85%. Mild to moderate mitral regurgitation. Severel lefta trial dilatation. Mild aortic stenosis and insufficiency. Normal RVSP.  ASSESSMENT AND PLAN:  1. Chronic atrial fib, now with RVR 2. Known mild LV dysfunction without sx's of CHF 3. CAD, native  vessel, without angina  Overall he appears stable from a cardiac perspective. Would continue IV metoprolol 10 mg every 6 hours until he is taking oral meds, he then could be converted back to Toprol XL. May consider adding digoxin if ventricular rates increase, but at present he is not  far from his baseline. His heart rates generally run around 100 bpm on outpatient basis. As he is at baseline from cardiac perspective, ok to leave off of telemetry. Resume anticoagulation with Xarelto when it's ok from a post-op bleeding perspective. We will follow while here. thanks  Signed, Sherren Mocha MD, [facility] 06/16/2014, 7:00 PM

## 2014-06-16 NOTE — Progress Notes (Signed)
Patient ID: Harold Jennings, male   DOB: Jul 03, 1932, 81 y.o.   MRN: [id number]  [facility]  POD#: 4  Subjective: Patient with one episode of emesis last night.  Taking small amounts clear liquids.  Few soft BM's.  Minimal pain.  Objective: Vital signs in last 24 hours: Temp:  [97.4 F (36.3 C)-98.4 F (36.9 C)] 97.4 F (36.3 C) (05/31 0942) Pulse Rate:  [81-118] 104 (05/31 0942) Resp:  [14-21] 18 (05/31 0942) BP: (120-140)/(66-97) 138/97 mmHg (05/31 0942) SpO2:  [9 %-100 %] 100 % (05/31 0942) Last BM Date: 06/15/14  Intake/Output from previous day: 05/30 0701 - 05/31 0700 In: 3071.3 [P.O.:490; I.V.:2081.3; IV Piggyback:500] Out: 875 [Urine:875] Intake/Output this shift: Total I/O In: 30 [P.O.:30] Out: 200 [Urine:200]  Physical Exam: HEENT - sclerae clear, mucous membranes moist Neck - soft Chest - clear bilaterally Cor - RRR Abdomen - soft, distended; few BS present; wound dry and intact with dressing in place Ext - no edema, non-tender Neuro - alert & oriented, no focal deficits  Lab Results:   Recent Labs  06/15/14 0405  WBC 12.4*  HGB 15.9  HCT 45.1  PLT 194   BMET  Recent Labs  06/15/14 0405  NA 138  K 3.6  CL 102  CO2 25  GLUCOSE 162*  BUN 14  CREATININE 0.94  CALCIUM 8.8*   PT/INR No results for input(s): LABPROT, INR in the last 72 hours. Comprehensive Metabolic Panel:    Component Value Date/Time   NA 138 06/15/2014 0405   NA 135 06/13/2014 0415   K 3.6 06/15/2014 0405   K 4.1 06/13/2014 0415   CL 102 06/15/2014 0405   CL 105 06/13/2014 0415   CO2 25 06/15/2014 0405   CO2 22 06/13/2014 0415   BUN 14 06/15/2014 0405   BUN 15 06/13/2014 0415   CREATININE 0.94 06/15/2014 0405   CREATININE 0.98 06/13/2014 0415   GLUCOSE 162* 06/15/2014 0405   GLUCOSE 189* 06/13/2014 0415   CALCIUM 8.8* 06/15/2014 0405   CALCIUM 8.4* 06/13/2014 0415   AST 22 04/09/2014 0540   AST 28 04/08/2014 2023   ALT 18  04/09/2014 0540   ALT 22 04/08/2014 2023   ALKPHOS 123* 04/09/2014 0540   ALKPHOS 134* 04/08/2014 2023   BILITOT 1.4* 04/09/2014 0540   BILITOT 0.9 04/08/2014 2023   PROT 6.1 04/09/2014 0540   PROT 6.9 04/08/2014 2023   ALBUMIN 3.2* 04/09/2014 0540   ALBUMIN 3.5 04/08/2014 2023    Studies/Results: Dg Chest Port 1 View  06/15/2014   CLINICAL DATA:  Shortness of breath and chest tightness. Three days post sigmoid colectomy, colovesical fistula repair and incarcerated umbilical hernia repair.  EXAM: PORTABLE CHEST - 1 VIEW  COMPARISON:  04/08/2014  FINDINGS: Bibasilar atelectasis present. There may be a small amount of left pleural fluid. There is no evidence of pulmonary edema, consolidation, pneumothorax, nodule or pleural fluid. Stable mild cardiomegaly.  IMPRESSION: Bibasilar atelectasis and potential small left pleural effusion.   Electronically Signed   By: Aletta Edouard M.D.   On: 06/15/2014 14:17    Anti-infectives: Anti-infectives    Start     Dose/Rate Route Frequency Ordered Stop   06/12/14 0618  ertapenem (INVANZ) 1 g in sodium chloride 0.9 % 50 mL IVPB  Status:  Discontinued     1 g 100 mL/hr over 30 Minutes Intravenous On call to O.R. 06/12/14 0618 06/12/14 1240      Assessment & Plans:  Status post sigmoid colectomy, cystorrhaphy for colovesical fistula  Continue clear liquid diet  Discontinue PCA - po pain Rx, IV if needed  OOB, ambulate with assist  Continue Foley catheter for one week, then cystogram per urology  Check labs in AM 6/1 Tachycardia, Afib  Will ask patient's cardiologist to evaluate  Earnstine Regal, MD, [facility] Office: [place] 06/16/2014

## 2014-06-16 NOTE — Progress Notes (Signed)
OT Cancellation Note  Patient Details Name: EVERITT WENNER MRN: [id number] DOB: 07-Jul-1932   Cancelled Treatment:    Reason Eval/Treat Not Completed: Other (comment) Attempted X 3 this am but first attempt pt just back to bed with nursing tech, second attempt pt eating, and third attempt pt was asleep and nursing requested to let pt sleep  Jules Schick  [phone] 06/16/2014, 11:20 AM

## 2014-06-16 NOTE — Progress Notes (Signed)
Physical Therapy Treatment Patient Details Name: Harold Jennings MRN: [id number] DOB: 07/01/1932 Today's Date: 06/16/2014    History of Present Illness 81 year old male presents for elective colectomy/cystorrhaphy for treatment of a colovesical fistula secondary to acute diverticulitis and a pericolonic abscess.  H/o HTN, CAD, obesity, cardiomyopathy, CHF, a.fib on xarelto, sleep apnea.    PT Comments    Assisted pt OOB to amb a great distance in the hallway then returned to room and assisted to BR.  Pt progressing well with his mobility.  Minimal ABD pain unless he is coughing or getting up.  Overall, feels "tired" but "better" each day.  Assisted back to bed and positioned to comfort.   Follow Up Recommendations  Home health PT     Equipment Recommendations  None recommended by PT (has a walker at home)    Recommendations for Other Services       Precautions / Restrictions Precautions Precautions: Fall Precaution Comments: abdominal incision Restrictions Weight Bearing Restrictions: No    Mobility  Bed Mobility Overal bed mobility: Needs Assistance Bed Mobility: Supine to Sit;Rolling Rolling: Min assist   Supine to sit: Mod assist     General bed mobility comments: Mod A to raise trunk, instructed pt in log roll, used rail.  Assist B LE's up onto bed to minimize ABD pain  Transfers Overall transfer level: Needs assistance Equipment used: Rolling walker (2 wheeled) Transfers: Sit to/from Stand Sit to Stand: Min guard         General transfer comment: assisted off bed and on/off toilet.  25% VC's safety with turns and hand placement with stand to sit to control decend and minimize ABD pain  Ambulation/Gait Ambulation/Gait assistance: Supervision Ambulation Distance (Feet): 145 Feet Assistive device: Rolling walker (2 wheeled) Gait Pattern/deviations: Step-through pattern;Decreased stride length;Trunk flexed Gait velocity: decreased   General Gait Details:  increased time with slightly flex posture due to recent ABD surgery.  Pt c/o that hospital walker does not roll straight. states, he has a "better one at home".    Stairs            Wheelchair Mobility    Modified Rankin (Stroke Patients Only)       Balance                                    Cognition Arousal/Alertness: Awake/alert Behavior During Therapy: WFL for tasks assessed/performed Overall Cognitive Status: Within Functional Limits for tasks assessed                      Exercises      General Comments        Pertinent Vitals/Pain Pain Assessment: 0-10 Pain Score: 2  Pain Location: ABD but "hurts more if I cough or laugh" Pain Descriptors / Indicators: Sore Pain Intervention(s): Limited activity within patient's tolerance    Home Living                      Prior Function            PT Goals (current goals can now be found in the care plan section) Progress towards PT goals: Progressing toward goals    Frequency  Min 3X/week    PT Plan      Co-evaluation             End of Session Equipment Utilized During Treatment:  Gait belt (high around chest) Activity Tolerance: Patient tolerated treatment well Patient left: with call bell/phone within reach     Time: 1512-1540 PT Time Calculation (min) (ACUTE ONLY): 28 min  Charges:  $Gait Training: 8-22 mins $Therapeutic Activity: 23-37 mins                    G Codes:      Rica Koyanagi  PTA [facility]  Acute  Rehab Pager      [phone]

## 2014-06-16 NOTE — Op Note (Addendum)
Preoperative diagnosis:colovesical fistula  Postoperative diagnosis:same   Procedure:cystorrhaphy    Co-Surgeons: Lillette Boxer. Diona Fanti, M.D. ,Armandina Gemma, M.D  Anesthesia: Carin Hock.   Complications:none  Specimen(s):none  Drain(s):20 French Foley catheter, to straight drain  Indications:81 year old male with known colovesical fistula, complications from acute diverticulitis with associated supravesical abscess.  The patient has been appropriately treated conservatively with antibiotics.  He presents at this time, because of persistent symptoms from his colovesical fistula and diverticulitis, for colectomy to be performed by Dr. Harlow Asa as well as cystorrhaphy.    Technique and findings:the patient was properly identified in the holding area and brought to the operating room.  He received preoperative IV antibiotics.  He received general endotracheal anesthetic and was placed in the dorsolithotomy position on the operating table.  Genitalia, perineum and abdomen were properly prepped and draped.  Timeout was then performed.    A 20 French Foley catheter was thenplaced in the patient's bladder without difficulty.  Air was liberated from the bladder at this point.  The Foley was plugged.  Dr. Harlow Asa performed a midline incision.  He then exposed the superior part of the bladder, dissecting the colon and an associated abscess off of the dome of the bladder.  Very small amount of purulent matter was obtained.Dr. Harlow Asa then mobilized the sigmoid colon in preparation for his eventual partial colectomy.  After the dome of the bladder was dissected, it was an approximate 1.5 cm defect in the dome of the bladder associated with the previously mentioned abscess.  I filled the bladder with approximately 250 cc of saline.  With the bladder distended like this, there was no significant leak of urine from the abscess cavity.  No other extravasation was seen at the dome of the bladder from a possible other fistula  site.  At this point, the bladder was drained.  The dome of the bladder where the abscess had been was copiously irrigated with saline.  I then decided to close the bladder defect.  2-0 Vicryl was placed to close the bladder defect-sutures were placed in a simple interrupted fashion.  At this point, the cystorrhaphy was completed.  Dr. Harlow Asa commenced with the remaining colectomy and anastomosis.  The patient tolerated the urologic part of the procedure well, no complications resulted.  The bladder will be drained appropriately with a Foley catheter for at least a week.

## 2014-06-17 DIAGNOSIS — N321 Vesicointestinal fistula: Principal | ICD-10-CM

## 2014-06-17 LAB — BASIC METABOLIC PANEL
ANION GAP: 8 (ref 5–15)
BUN: 16 mg/dL (ref 6–20)
CALCIUM: 8.7 mg/dL — AB (ref 8.9–10.3)
CO2: 23 mmol/L (ref 22–32)
Chloride: 107 mmol/L (ref 101–111)
Creatinine, Ser: 1.07 mg/dL (ref 0.61–1.24)
GFR calc Af Amer: >60 mL/min (ref >60)
GFR calc non Af Amer: >60 mL/min (ref >60)
GLUCOSE: 146 mg/dL — AB (ref 65–99)
POTASSIUM: 3.3 mmol/L — AB (ref 3.5–5.1)
Sodium: 138 mmol/L (ref 135–145)

## 2014-06-17 LAB — CBC
HEMATOCRIT: 41.6 % (ref 39.0–52.0)
Hemoglobin: 14 g/dL (ref 13.0–17.0)
MCH: 32.9 pg (ref 26.0–34.0)
MCHC: 33.7 g/dL (ref 30.0–36.0)
MCV: 97.9 fL (ref 78.0–100.0)
PLATELETS: 210 10*3/uL (ref 150–400)
RBC: 4.25 MIL/uL (ref 4.22–5.81)
RDW: 15.3 % (ref 11.5–15.5)
WBC: 12 10*3/uL — AB (ref 4.0–10.5)

## 2014-06-17 MED ORDER — PROMETHAZINE HCL 25 MG/ML IJ SOLN
12.5000 mg | INTRAMUSCULAR | Status: DC | PRN
  Administered 2014-06-17: 12.5 mg via INTRAVENOUS
  Filled 2014-06-17: qty 1

## 2014-06-17 MED ORDER — ZOLPIDEM TARTRATE 5 MG PO TABS
5.0000 mg | ORAL_TABLET | Freq: Every evening | ORAL | Status: DC | PRN
  Administered 2014-06-17 – 2014-06-21 (×2): 5 mg via ORAL
  Filled 2014-06-17 (×2): qty 1

## 2014-06-17 MED ORDER — BOOST / RESOURCE BREEZE PO LIQD
1.0000 | Freq: Three times a day (TID) | ORAL | Status: DC
  Administered 2014-06-17 – 2014-06-20 (×4): 1 via ORAL

## 2014-06-17 MED ORDER — DIPHENHYDRAMINE HCL 25 MG PO CAPS: 25.0000 mg | ORAL_CAPSULE | Freq: Every evening | ORAL | Status: DC | PRN

## 2014-06-17 MED ORDER — METOCLOPRAMIDE HCL 5 MG/ML IJ SOLN
5.0000 mg | Freq: Four times a day (QID) | INTRAMUSCULAR | Status: DC | PRN
  Administered 2014-06-17: 5 mg via INTRAVENOUS
  Filled 2014-06-17: qty 2

## 2014-06-17 MED ORDER — POTASSIUM CHLORIDE CRYS ER 20 MEQ PO TBCR
40.0000 meq | EXTENDED_RELEASE_TABLET | Freq: Once | ORAL | Status: AC
  Administered 2014-06-17: 40 meq via ORAL
  Filled 2014-06-17: qty 2

## 2014-06-17 NOTE — Evaluation (Signed)
Occupational Therapy Evaluation Patient Details Name: Harold Jennings MRN: [id number] DOB: [birth year] Today's Date: 06/17/2014    History of Present Illness 81 year old male presents for elective colectomy/cystorrhaphy for treatment of a colovesical fistula secondary to acute diverticulitis and a pericolonic abscess.  H/o HTN, CAD, obesity, cardiomyopathy, CHF, a.fib on xarelto, sleep apnea.   Clinical Impression   Pt moving well with min guard assist to the bathroom to wash his hands in prep for breakfast. Pt complains of feeling "sore" but not rated. Will follow for continued OT to progress ADL and safety.    Follow Up Recommendations  No OT follow up;Supervision/Assistance - 24 hour    Equipment Recommendations  None recommended by OT    Recommendations for Other Services       Precautions / Restrictions Precautions Precautions: Fall Precaution Comments: abdominal incision Restrictions Weight Bearing Restrictions: No      Mobility Bed Mobility Overal bed mobility: Needs Assistance Bed Mobility: Rolling;Sidelying to Sit Rolling: Min guard Sidelying to sit: Min assist       General bed mobility comments: min assist for trunk to upright.  Transfers Overall transfer level: Needs assistance Equipment used: Rolling walker (2 wheeled) Transfers: Sit to/from Stand Sit to Stand: Min guard         General transfer comment: verbal cues for hand placement.    Balance                                            ADL Overall ADL's : Needs assistance/impaired Eating/Feeding: Sitting;Independent Eating/Feeding Details (indicate cue type and reason): clear liquids Grooming: Wash/dry hands;Min guard;Standing   Upper Body Bathing: Set up;Sitting   Lower Body Bathing: Minimal assistance;Sit to/from stand   Upper Body Dressing : Set up;Sitting   Lower Body Dressing: Moderate assistance;Sit to/from stand   Toilet Transfer: Min guard;Ambulation;RW    Toileting- Water quality scientist and Hygiene: Min guard;Sit to/from stand         General ADL Comments: Pt states he had to drop pants down to the floor PTA and "step into" pants while seated and then pull up. He pulled his legs up onto the bed to don socks. Educated on AE options and pt has a Secondary school teacher but states he may need a new one so educated on coverage and uses. He states wife can also assist. Requested pt practice sitting on comfort height commode with bar but pt declined to practice this with no reason given. He states he has 3in1 if needed. O2 sats 98% after activiyt on RA and O2 left off and nursing aware.      Vision     Perception     Praxis      Pertinent Vitals/Pain Pain Location: pt didnt rate but stated "sore" Pain Descriptors / Indicators: Sore Pain Intervention(s): Repositioned     Hand Dominance     Extremity/Trunk Assessment Upper Extremity Assessment Upper Extremity Assessment: Overall WFL for tasks assessed           Communication Communication Communication: No difficulties   Cognition Arousal/Alertness: Awake/alert Behavior During Therapy: WFL for tasks assessed/performed Overall Cognitive Status: Within Functional Limits for tasks assessed                     General Comments       Exercises       Shoulder Instructions  Home Living Family/patient expects to be discharged to:: Private residence Living Arrangements: Spouse/significant other Available Help at Discharge: Available PRN/intermittently Type of Home: House Home Access: Stairs to enter [employer] of Steps: 3 + 3 Entrance Stairs-Rails: Right;Left       Bathroom Shower/Tub: Occupational psychologist: Standard (? comfort height)     Home Equipment: Environmental consultant - 4 wheels;Cane - single point;Bedside commode;Grab bars - tub/shower;Grab bars - toilet;Shower seat - built in          Prior Functioning/Environment Level of Independence: Independent         Comments: uses cane or rollator    OT Diagnosis: Generalized weakness   OT Problem List: Decreased strength;Decreased knowledge of use of DME or AE   OT Treatment/Interventions: Self-care/ADL training;Patient/family education;Therapeutic activities;DME and/or AE instruction    OT Goals(Current goals can be found in the care plan section) Acute Rehab OT Goals Patient Stated Goal: none stated. OT Goal Formulation: With patient Time For Goal Achievement: 07/01/14 Potential to Achieve Goals: Good  OT Frequency: Min 2X/week   Barriers to D/C:            Co-evaluation              End of Session Equipment Utilized During Treatment: Rolling walker  Activity Tolerance: Patient tolerated treatment well Patient left: in chair;with call bell/phone within reach   Time: 0850-0905 OT Time Calculation (min): 15 min Charges:  OT General Charges $OT Visit: 1 Procedure OT Evaluation $Initial OT Evaluation Tier I: 1 Procedure G-Codes:    Jules Schick  [phone] 06/17/2014, 9:28 AM

## 2014-06-17 NOTE — Progress Notes (Signed)
Patient ID: JAIMES ECKERT, male   DOB: 09/17/1932, 81 y.o.   MRN: [id number]  [facility]  POD#: 4  Subjective: Patient with another episode of emesis last night.  Taking small amounts clear liquids.  Few soft BM's, somewhat uncontrolled.  Minimal pain.  Objective: Vital signs in last 24 hours: Temp:  [97.4 F (36.3 C)-98.5 F (36.9 C)] 97.7 F (36.5 C) (06/01 0550) Pulse Rate:  [57-103] 98 (06/01 0550) Resp:  [16-18] 16 (06/01 0550) BP: (118-140)/(62-99) 137/65 mmHg (06/01 0550) SpO2:  [98 %-100 %] 98 % (06/01 0923) Last BM Date: 06/17/14  Intake/Output from previous day: 05/31 0701 - 06/01 0700 In: 1890 [P.O.:290; I.V.:1600] Out: 1190 [Urine:1190] Intake/Output this shift:    Physical Exam: HEENT - sclerae clear, mucous membranes moist Abdomen - soft, distended; wound dry and intact with dressing in place Ext - no edema, non-tender Neuro - alert & oriented, no focal deficits  Lab Results:   Recent Labs  06/15/14 0405 06/17/14 0450  WBC 12.4* 12.0*  HGB 15.9 14.0  HCT 45.1 41.6  PLT 194 210   BMET  Recent Labs  06/15/14 0405 06/17/14 0450  NA 138 138  K 3.6 3.3*  CL 102 107  CO2 25 23  GLUCOSE 162* 146*  BUN 14 16  CREATININE 0.94 1.07  CALCIUM 8.8* 8.7*   PT/INR No results for input(s): LABPROT, INR in the last 72 hours. Comprehensive Metabolic Panel:    Component Value Date/Time   NA 138 06/17/2014 0450   NA 138 06/15/2014 0405   K 3.3* 06/17/2014 0450   K 3.6 06/15/2014 0405   CL 107 06/17/2014 0450   CL 102 06/15/2014 0405   CO2 23 06/17/2014 0450   CO2 25 06/15/2014 0405   BUN 16 06/17/2014 0450   BUN 14 06/15/2014 0405   CREATININE 1.07 06/17/2014 0450   CREATININE 0.94 06/15/2014 0405   GLUCOSE 146* 06/17/2014 0450   GLUCOSE 162* 06/15/2014 0405   CALCIUM 8.7* 06/17/2014 0450   CALCIUM 8.8* 06/15/2014 0405   AST 22 04/09/2014 0540   AST 28 04/08/2014 2023   ALT 18 04/09/2014 0540   ALT 22  04/08/2014 2023   ALKPHOS 123* 04/09/2014 0540   ALKPHOS 134* 04/08/2014 2023   BILITOT 1.4* 04/09/2014 0540   BILITOT 0.9 04/08/2014 2023   PROT 6.1 04/09/2014 0540   PROT 6.9 04/08/2014 2023   ALBUMIN 3.2* 04/09/2014 0540   ALBUMIN 3.5 04/08/2014 2023    Studies/Results: Dg Chest Port 1 View  06/15/2014   CLINICAL DATA:  Shortness of breath and chest tightness. Three days post sigmoid colectomy, colovesical fistula repair and incarcerated umbilical hernia repair.  EXAM: PORTABLE CHEST - 1 VIEW  COMPARISON:  04/08/2014  FINDINGS: Bibasilar atelectasis present. There may be a small amount of left pleural fluid. There is no evidence of pulmonary edema, consolidation, pneumothorax, nodule or pleural fluid. Stable mild cardiomegaly.  IMPRESSION: Bibasilar atelectasis and potential small left pleural effusion.   Electronically Signed   By: Aletta Edouard M.D.   On: 06/15/2014 14:17    Anti-infectives: Anti-infectives    Start     Dose/Rate Route Frequency Ordered Stop   06/12/14 0618  ertapenem (INVANZ) 1 g in sodium chloride 0.9 % 50 mL IVPB  Status:  Discontinued     1 g 100 mL/hr over 30 Minutes Intravenous On call to O.R. 06/12/14 0618 06/12/14 1240      Assessment & Plans: Status post sigmoid  colectomy, cystorrhaphy for colovesical fistula  Continue clear liquid diet and IVF's, will try some reglan prn to see if that helps nausea better  Cont po pain Rx, IV if needed  OOB, ambulate with assist  Continue Foley catheter for one week, then cystogram per urology  Hypokalemia: PO replacements ordered   Tachycardia, Afib  Per note, pt near baseline.  Cont metoprolol.  Will let Dr Harlow Asa decide when to restart Xarelto.    Rosario Adie, MD  Colorectal and General Surgery [facility]   [place], [place] C. 0/09/3233

## 2014-06-17 NOTE — Progress Notes (Signed)
5 Days Post-Op Subjective: Patient reports some nausea and belching.  He did have a bowel movement or to yesterday.  Objective: Vital signs in last 24 hours: Temp:  [97.4 F (36.3 C)-98.5 F (36.9 C)] 97.7 F (36.5 C) (06/01 0550) Pulse Rate:  [57-104] 98 (06/01 0550) Resp:  [16-18] 16 (06/01 0550) BP: (118-140)/(62-99) 137/65 mmHg (06/01 0550) SpO2:  [98 %-100 %] 99 % (06/01 0550)  Intake/Output from previous day: 05/31 0701 - 06/01 0700 In: 1890 [P.O.:290; I.V.:1600] Out: 1190 [Urine:1190] Intake/Output this shift:    Physical Exam:  Constitutional: Vital signs reviewed. WD WN in NAD   Eyes: PERRL, No scleral icterus.   Pulmonary/Chest: Normal effort Abdominal: incision, still with dressing on, no erythema.  Urine slightly pink, without clots, draining well Lab Results:  Recent Labs  06/15/14 0405 06/17/14 0450  HGB 15.9 14.0  HCT 45.1 41.6   BMET  Recent Labs  06/15/14 0405 06/17/14 0450  NA 138 138  K 3.6 3.3*  CL 102 107  CO2 25 23  GLUCOSE 162* 146*  BUN 14 16  CREATININE 0.94 1.07  CALCIUM 8.8* 8.7*   No results for input(s): LABPT, INR in the last 72 hours. No results for input(s): LABURIN in the last 72 hours. Results for orders placed or performed during the hospital encounter of 06/12/14  MRSA PCR Screening     Status: Abnormal   Collection Time: 06/12/14  1:00 PM  Result Value Ref Range Status   MRSA by PCR POSITIVE (A) NEGATIVE Final    Comment:        The GeneXpert MRSA Assay (FDA approved for NASAL specimens only), is one component of a comprehensive MRSA colonization surveillance program. It is not intended to diagnose MRSA infection nor to guide or monitor treatment for MRSA infections. RESULT CALLED TO, READ BACK BY AND VERIFIED WITH: [name] [id number] @ 1440 BY J SCOTTON     Studies/Results: Dg Chest Port 1 View  06/15/2014   CLINICAL DATA:  Shortness of breath and chest tightness. Three days post sigmoid colectomy,  colovesical fistula repair and incarcerated umbilical hernia repair.  EXAM: PORTABLE CHEST - 1 VIEW  COMPARISON:  04/08/2014  FINDINGS: Bibasilar atelectasis present. There may be a small amount of left pleural fluid. There is no evidence of pulmonary edema, consolidation, pneumothorax, nodule or pleural fluid. Stable mild cardiomegaly.  IMPRESSION: Bibasilar atelectasis and potential small left pleural effusion.   Electronically Signed   By: Aletta Edouard M.D.   On: 06/15/2014 14:17    Assessment/Plan:   Postoperative day #5, sigmoid colectomy, closure of colovesical fistula.  Slow but steady recovery.  I have ordered a cystogram to be performed in the morning-if that shows normal findings, I will have the catheter discontinued.   LOS: 5 days   Harold Jennings M 06/17/2014, 9:10 AM

## 2014-06-17 NOTE — Progress Notes (Signed)
Patient Profile: 81 y/o male with h/o chronic atrial fibrillation, CHF (EF 40-45%) and CAD. He underwent sigmoidectomy and repair of colovesicular fistula 5/27 by Dr Harlow Asa and Dr Diona Fanti. Post-op recovery complicated by rapid atrial fibrillation.   Subjective: Out of bed. Sitting in chair. No complaints. Denies palpitations, CP and dyspnea.  Objective: Vital signs in last 24 hours: Temp:  [97.4 F (36.3 C)-98.5 F (36.9 C)] 97.7 F (36.5 C) (06/01 0550) Pulse Rate:  [57-104] 98 (06/01 0550) Resp:  [16-18] 16 (06/01 0550) BP: (118-140)/(62-99) 137/65 mmHg (06/01 0550) SpO2:  [97 %-100 %] 99 % (06/01 0550) Last BM Date: 06/16/14  Intake/Output from previous day: 05/31 0701 - 06/01 0700 In: 1890 [P.O.:290; I.V.:1600] Out: 1190 [Urine:1190] Intake/Output this shift:    Medications Current Facility-Administered Medications  Medication Dose Route Frequency Provider Last Rate Last Dose  . antiseptic oral rinse (CPC / CETYLPYRIDINIUM CHLORIDE 0.05%) solution 7 mL  7 mL Mouth Rinse BID Armandina Gemma, MD   7 mL at 06/16/14 1007  . dextrose 5 % and 0.45 % NaCl with KCl 20 mEq/L infusion   Intravenous Continuous Leighton Ruff, MD 409 mL/hr at 06/17/14 0400    . enoxaparin (LOVENOX) injection 40 mg  40 mg Subcutaneous Q24H Armandina Gemma, MD   40 mg at 06/16/14 1002  . HYDROcodone-acetaminophen (NORCO/VICODIN) 5-325 MG per tablet 1-2 tablet  1-2 tablet Oral Q4H PRN Armandina Gemma, MD      . metoprolol (LOPRESSOR) injection 10 mg  10 mg Intravenous 4 times per day Leighton Ruff, MD   10 mg at 06/17/14 0535  . morphine 2 MG/ML injection 1-4 mg  1-4 mg Intravenous Q2H PRN Armandina Gemma, MD      . mupirocin ointment (BACTROBAN) 2 % 1 application  1 application Nasal BID Armandina Gemma, MD   1 application at 81/19/14 1002  . ondansetron (ZOFRAN) tablet 4 mg  4 mg Oral Q6H PRN Armandina Gemma, MD       Or  . ondansetron [facility]) injection 4 mg  4 mg Intravenous Q6H PRN Armandina Gemma, MD   4 mg at 06/16/14  0123  . promethazine (PHENERGAN) injection 12.5 mg  12.5 mg Intravenous Q4H PRN Excell Seltzer, MD   12.5 mg at 06/15/14 0317    PE: General appearance: alert, cooperative and no distress Neck: no carotid bruit and no JVD Lungs: clear to auscultation bilaterally Heart: irregularly irregular rhythm and controlled rate Extremities: no LEE Pulses: 2+ and symmetric Skin: warm and dry Neurologic: Grossly normal  Lab Results:   Recent Labs  06/15/14 0405 06/17/14 0450  WBC 12.4* 12.0*  HGB 15.9 14.0  HCT 45.1 41.6  PLT 194 210   BMET  Recent Labs  06/15/14 0405 06/17/14 0450  NA 138 138  K 3.6 3.3*  CL 102 107  CO2 25 23  GLUCOSE 162* 146*  BUN 14 16  CREATININE 0.94 1.07  CALCIUM 8.8* 8.7*    Assessment/Plan  Principal Problem:   Colovesical fistula Active Problems:   Sigmoid diverticulitis   1. Sigmoid Diverticulitis and Colovesical Fistula: s/p sigmoidectomy and repair of colovesicular fistula 5/27.  2. Chronic Atrial Fibrillation: Post operative course apparently complicated by RVR (reason for initial consult), however patient not on telemetry floor, therefore unable to assess if rate has been stable/ well controlled over the last 12 hrs. Most recent vitals check revealed HR in the upper 90s. Rate sounds ok on exam. Pulse rate not elevated on exam. Per Dr. Burt Knack (primary  cardiologist), his baseline resting HR is typically around 100 bpm on an outpatient basis. Continue IV metoprolol 10 mg Q6H and transition to PO once he is able to tolerate orals. Resume anticoagulation with Xarelto when it's ok from a post-op bleeding perspective. Recommend correction of his hypokalemia (3.3) to prevent exacerbation of his arrhthymia.     LOS: 5 days    Brittainy M. Ladoris Gene 06/17/2014 7:52 AM  Patient seen and examined and history reviewed. Agree with above findings and plan. No chest pain or SOB. On exam HR is well controlled in the 90s. On IV metoprolol. Still not  taking po's well. When he is able to take po's will transition to oral meds. Potassium repleted.  Peter Martinique, [place] 06/17/2014 2:01 PM

## 2014-06-17 NOTE — Care Management Note (Signed)
Case Management Note  Patient Details  Name: Harold Jennings MRN: [id number] Date of Birth: September 30, 1932  Subjective/Objective:         Admitted s/p sigmoid colectomy, cystorrhaphy for colovesical fistula           Action/Plan: Discharge planning, spoke with patient at bedside 5/31 and left [facility] list for choice. Returned today and discussed recommendations for HHPT, patient states he has used [facility] in the past and would like to use them again. Contacted [facility] to follow, awaiting final HH orders.   Expected Discharge Date:                  Expected Discharge Plan:  [place]  In-House Referral:  NA  Discharge planning Services  CM Consult  Post Acute Care Choice:    Choice offered to:  Patient  DME Arranged:    DME Agency:     HH Arranged:  PT [place]:  [place]  Status of Service:  In process, will continue to follow  Medicare Important Message Given:    Date Medicare IM Given:    Medicare IM give by:    Date Additional Medicare IM Given:    Additional Medicare Important Message give by:     If discussed at [place] of Stay Meetings, dates discussed:    Additional Comments:  Guadalupe Maple, RN 06/17/2014, 10:26 AM

## 2014-06-18 ENCOUNTER — Inpatient Hospital Stay (HOSPITAL_COMMUNITY): Payer: Medicare Other

## 2014-06-18 LAB — BASIC METABOLIC PANEL
Anion gap: 7 (ref 5–15)
BUN: 14 mg/dL (ref 6–20)
CO2: 21 mmol/L — ABNORMAL LOW (ref 22–32)
Calcium: 8.4 mg/dL — ABNORMAL LOW (ref 8.9–10.3)
Chloride: 110 mmol/L (ref 101–111)
Creatinine, Ser: 0.99 mg/dL (ref 0.61–1.24)
GFR calc Af Amer: >60 mL/min (ref >60)
GFR calc non Af Amer: >60 mL/min (ref >60)
Glucose, Bld: 119 mg/dL — ABNORMAL HIGH (ref 65–99)
POTASSIUM: 3.4 mmol/L — AB (ref 3.5–5.1)
SODIUM: 138 mmol/L (ref 135–145)

## 2014-06-18 LAB — CBC
HCT: 39.5 % (ref 39.0–52.0)
HEMOGLOBIN: 13.3 g/dL (ref 13.0–17.0)
MCH: 32.6 pg (ref 26.0–34.0)
MCHC: 33.7 g/dL (ref 30.0–36.0)
MCV: 96.8 fL (ref 78.0–100.0)
Platelets: 218 10*3/uL (ref 150–400)
RBC: 4.08 MIL/uL — AB (ref 4.22–5.81)
RDW: 15.2 % (ref 11.5–15.5)
WBC: 9.7 10*3/uL (ref 4.0–10.5)

## 2014-06-18 MED ORDER — DIATRIZOATE MEGLUMINE 30 % UR SOLN
Freq: Once | URETHRAL | Status: AC | PRN
  Administered 2014-06-18: 200 mL

## 2014-06-18 MED ORDER — RIVAROXABAN 20 MG PO TABS
20.0000 mg | ORAL_TABLET | Freq: Every day | ORAL | Status: DC
  Administered 2014-06-18 – 2014-06-22 (×5): 20 mg via ORAL
  Filled 2014-06-18 (×6): qty 1

## 2014-06-18 MED ORDER — METOPROLOL SUCCINATE ER 50 MG PO TB24
50.0000 mg | ORAL_TABLET | Freq: Every day | ORAL | Status: DC
  Administered 2014-06-18: 50 mg via ORAL
  Filled 2014-06-18 (×2): qty 1

## 2014-06-18 NOTE — Progress Notes (Signed)
PT Cancellation Note  Patient Details Name: Harold Jennings MRN: [id number] DOB: [birth year]   Cancelled Treatment:    Reason Eval/Treat Not Completed: Other (comment) (pt sleeping, family requested PT attempt later. ) Will follow.    Blondell Reveal Kistler 06/18/2014, 2:06 PM  [phone]

## 2014-06-18 NOTE — Progress Notes (Signed)
Patient ID: Harold Jennings, male   DOB: December 10, 1932, 81 y.o.   MRN: [id number]  [facility]  POD#: 6  Subjective: Patient in bed, comfortable, no emesis in 24 hours.  Ambulated in hall.  Soft BM x 2 today.  Went down for cystogram today.  Objective: Vital signs in last 24 hours: Temp:  [97.7 F (36.5 C)-98.2 F (36.8 C)] 97.7 F (36.5 C) (06/02 0536) Pulse Rate:  [79-103] 96 (06/02 0536) Resp:  [16] 16 (06/02 0536) BP: (129-147)/(79-88) 140/85 mmHg (06/02 0536) SpO2:  [99 %] 99 % (06/02 0536) Last BM Date: 06/17/14  Intake/Output from previous day: 06/01 0701 - 06/02 0700 In: 3260 [P.O.:60; I.V.:3200] Out: 525 [Urine:525] Intake/Output this shift: Total I/O In: 0  Out: 100 [Urine:100]  Physical Exam: HEENT - sclerae clear, mucous membranes moist Neck - soft Chest - clear bilaterally Cor - RRR Abdomen - soft, mild distension; BS present; wound dry and intact Ext - no edema, non-tender Neuro - alert & oriented, no focal deficits  Lab Results:   Recent Labs  06/17/14 0450 06/18/14 0434  WBC 12.0* 9.7  HGB 14.0 13.3  HCT 41.6 39.5  PLT 210 218   BMET  Recent Labs  06/17/14 0450 06/18/14 0434  NA 138 138  K 3.3* 3.4*  CL 107 110  CO2 23 21*  GLUCOSE 146* 119*  BUN 16 14  CREATININE 1.07 0.99  CALCIUM 8.7* 8.4*   PT/INR No results for input(s): LABPROT, INR in the last 72 hours. Comprehensive Metabolic Panel:    Component Value Date/Time   NA 138 06/18/2014 0434   NA 138 06/17/2014 0450   K 3.4* 06/18/2014 0434   K 3.3* 06/17/2014 0450   CL 110 06/18/2014 0434   CL 107 06/17/2014 0450   CO2 21* 06/18/2014 0434   CO2 23 06/17/2014 0450   BUN 14 06/18/2014 0434   BUN 16 06/17/2014 0450   CREATININE 0.99 06/18/2014 0434   CREATININE 1.07 06/17/2014 0450   GLUCOSE 119* 06/18/2014 0434   GLUCOSE 146* 06/17/2014 0450   CALCIUM 8.4* 06/18/2014 0434   CALCIUM 8.7* 06/17/2014 0450   AST 22 04/09/2014 0540   AST  28 04/08/2014 2023   ALT 18 04/09/2014 0540   ALT 22 04/08/2014 2023   ALKPHOS 123* 04/09/2014 0540   ALKPHOS 134* 04/08/2014 2023   BILITOT 1.4* 04/09/2014 0540   BILITOT 0.9 04/08/2014 2023   PROT 6.1 04/09/2014 0540   PROT 6.9 04/08/2014 2023   ALBUMIN 3.2* 04/09/2014 0540   ALBUMIN 3.5 04/08/2014 2023    Studies/Results: No results found.  Anti-infectives: Anti-infectives    Start     Dose/Rate Route Frequency Ordered Stop   06/12/14 0618  ertapenem (INVANZ) 1 g in sodium chloride 0.9 % 50 mL IVPB  Status:  Discontinued     1 g 100 mL/hr over 30 Minutes Intravenous On call to O.R. 06/12/14 0618 06/12/14 1240      Assessment & Plans: Status post sigmoid colectomy with takedown of colovesical fistula  Advance to full liquid diet  OOB, ambulate  Restart Xarelto Afib  Per cardiology Foley catheter  Per urology  Earnstine Regal, MD, [facility] Office: [place] 06/18/2014

## 2014-06-18 NOTE — Progress Notes (Signed)
Patient returned from radiology after completion of cystogram without indwelling catheter. MD notified of removal by radiology team, per results of cystogram.

## 2014-06-18 NOTE — Progress Notes (Signed)
ANTICOAGULATION CONSULT NOTE - Initial Consult  Pharmacy Consult for Xarelto Indication: atrial fibrillation  No Known Allergies  Patient Measurements: Height: 5\' 5"  (165.1 cm) Weight: 205 lb 14.6 oz (93.4 kg) IBW/kg (Calculated) : 61.5  Vital Signs: Temp: 97.7 F (36.5 C) (06/02 0536) Temp Source: Oral (06/02 0536) BP: 140/85 mmHg (06/02 0536) Pulse Rate: 96 (06/02 0536)  Labs:  Recent Labs  06/17/14 0450 06/18/14 0434  HGB 14.0 13.3  HCT 41.6 39.5  PLT 210 218  CREATININE 1.07 0.99    Estimated Creatinine Clearance: 62.5 mL/min (by C-G formula based on Cr of 0.99).   Medical History: Past Medical History  Diagnosis Date  . Dyslipidemia   . HTN (hypertension)   . Obesity   . Increased liver enzymes     felt to be a fatty liver per ultrasound in 2011  . Dyspnea   . Ischemic cardiomyopathy   . Spinal stenosis   . Sleep apnea     bipap   . CAD (coronary artery disease)     Silent MI sometime prior to 2000. S/P cath in 2000 showing a total mid RCA with collaterals from the LCX. EF is 40 to50%; Last Myoview in 2010 showing inferior scar without ischemia. EF was 52%.  Marland Kitchen Dysrhythmia     atrial fib   . Edema     lower extremity   . Cancer     skin cancer   . Anemia   . Diverticular disease dec 2015  . Umbilical hernia     Medications:  Prescriptions prior to admission  Medication Sig Dispense Refill Last Dose  . ciprofloxacin (CIPRO) 500 MG tablet Take 1 tablet (500 mg total) by mouth 2 (two) times daily. (Patient not taking: Reported on 06/09/2014) 20 tablet 2 Not Taking  . diphenhydrAMINE (BENADRYL) 25 mg capsule Take 25 mg by mouth at bedtime.    06/11/2014 at 2200  . doxazosin (CARDURA) 8 MG tablet Take 8 mg by mouth at bedtime.   06/11/2014 at 2200  . ezetimibe (ZETIA) 10 MG tablet Take 10 mg by mouth every morning.    06/11/2014 at 0900  . furosemide (LASIX) 40 MG tablet Take 40 mg by mouth as needed for fluid or edema (fluid).    06/10/2014  . ibuprofen  (ADVIL,MOTRIN) 200 MG tablet Take 400 mg by mouth every 6 (six) hours as needed for headache or moderate pain.   06/10/2014  . lactose free nutrition (BOOST) LIQD Take 237 mLs by mouth daily at 12 noon.   06/10/2014  . levothyroxine (SYNTHROID, LEVOTHROID) 100 MCG tablet Take 100 mcg by mouth daily.  12 06/11/2014 at 0900  . loperamide (IMODIUM) 1 MG/5ML solution Take 10 mLs (2 mg total) by mouth every 6 (six) hours as needed for diarrhea or loose stools. 120 mL 0 Past Month at Unknown time  . metoprolol succinate (TOPROL-XL) 50 MG 24 hr tablet Take 50 mg by mouth every morning. Take with or immediately following a meal.   06/12/2014 at 0500  . Multiple Vitamin (MULTIVITAMIN) tablet Take 1 tablet by mouth every morning.    06/01/2014  . Rivaroxaban (XARELTO) 15 MG TABS tablet Take 1 tablet (15 mg total) by mouth daily with supper. 30 tablet 0 06/01/2014  . saccharomyces boulardii (FLORASTOR) 250 MG capsule Take 250 mg by mouth every morning.    06/10/2014  . finasteride (PROSCAR) 5 MG tablet Take 5 mg by mouth daily.   06/11/2014 at 0900  . metroNIDAZOLE (FLAGYL) 500 MG  tablet Take 1 tablet (500 mg total) by mouth 3 (three) times daily. (Patient not taking: Reported on 06/08/2014) 30 tablet 2 Taking   Scheduled:  . antiseptic oral rinse  7 mL Mouth Rinse BID  . feeding supplement (RESOURCE BREEZE)  1 Container Oral TID BM  . metoprolol succinate  50 mg Oral Daily  . rivaroxaban  20 mg Oral Q supper   Assessment: 81 y/o male with h/o chronic atrial fibrillation, CHF (EF 40-45%) and CAD. He underwent sigmoidectomy and repair of colovesicular fistula 5/27 by Dr Harlow Asa and Dr Diona Fanti. PTA Xarelto held prior to procedure, now to resume per pharmacy.   Prior anticoagulation: Xarelto 15 mg PO daily PTA.  Note that this dose was started in January with CrCl < 50, with 20 mg daily being documented in cardiology OV notes up to this point.  Currently on Lovenox 40 mg SQ q12 hr for VTE prophylaxis  Significant  events:  Today, 06/18/2014:  CBC: Hgb/PLt wnl  Major drug interactions: none  No bleeding issues per nursing  SCr wnl; CrCl 77 using TBW as recommended with Xarelto  Goal of Therapy: Prevention of thromboembolism  Plan:  Stop Lovenox; last dose given yesterday around 10am  Xarelto 20 mg daily with supper.  Will give today's dose with lunch as last Lovenox dose > 24 hrs ago.  Resuming full-dose Xarelto, as I cannot determine any reason this dose should have been reduced, other than his past renal impairment which has since resolved.  In the absence of new findings, recommend continuing 20 mg PO daily at discharge with adequate follow-up (Cardiologist is Dr. Sherren Mocha)  CBC at least q72 hr while on Xarelto  Monitor for signs of bleeding or thrombosis   Reuel Boom, PharmD Pager: [phone] 06/18/2014, 1:19 PM

## 2014-06-18 NOTE — Progress Notes (Signed)
6 Days Post-Op Subjective: Patient reports that he is feeling better. Less queasy. Still having loose bowel movements  Objective: Vital signs in last 24 hours: Temp:  [97.7 F (36.5 C)-98.2 F (36.8 C)] 97.7 F (36.5 C) (06/02 0536) Pulse Rate:  [79-103] 96 (06/02 0536) Resp:  [16] 16 (06/02 0536) BP: (129-147)/(79-88) 140/85 mmHg (06/02 0536) SpO2:  [99 %] 99 % (06/02 0536)  Intake/Output from previous day: 06/01 0701 - 06/02 0700 In: 3260 [P.O.:60; I.V.:3200] Out: 525 [Urine:525] Intake/Output this shift: Total I/O In: 0  Out: 100 [Urine:100]  Physical Exam:  Constitutional: Vital signs reviewed. WD WN in NAD   Eyes: PERRL, No scleral icterus.   Pulmonary/Chest: Normal effort  Urine is clear.  Lab Results:  Recent Labs  06/17/14 0450 06/18/14 0434  HGB 14.0 13.3  HCT 41.6 39.5   BMET  Recent Labs  06/17/14 0450 06/18/14 0434  NA 138 138  K 3.3* 3.4*  CL 107 110  CO2 23 21*  GLUCOSE 146* 119*  BUN 16 14  CREATININE 1.07 0.99  CALCIUM 8.7* 8.4*   No results for input(s): LABPT, INR in the last 72 hours. No results for input(s): LABURIN in the last 72 hours. Results for orders placed or performed during the hospital encounter of 06/12/14  MRSA PCR Screening     Status: Abnormal   Collection Time: 06/12/14  1:00 PM  Result Value Ref Range Status   MRSA by PCR POSITIVE (A) NEGATIVE Final    Comment:        The GeneXpert MRSA Assay (FDA approved for NASAL specimens only), is one component of a comprehensive MRSA colonization surveillance program. It is not intended to diagnose MRSA infection nor to guide or monitor treatment for MRSA infections. RESULT CALLED TO, READ BACK BY AND VERIFIED WITH: [name] [id number] @ 1440 BY J SCOTTON     Studies/Results: Dg Cystogram  06/18/2014   CLINICAL DATA:  81 year old male with history of complicated diverticulitis with colovesical fistula. Status post surgical repair.  EXAM: CYSTOGRAM  TECHNIQUE: The  bladder was filled with 200 mL Cysto-Hypaque 30% by drip infusion via the patient's indwelling Foley catheter. Serial spot images were obtained during bladder filling and post draining.  FLUOROSCOPY TIME:  6 minutes and 13 seconds  COMPARISON:  CT of the abdomen and pelvis 04/22/2014.  FINDINGS: Urinary bladder is highly trabeculated, with multiple small bladder wall diverticulae. At no point during the examination was there a fistulous communication identified between the bladder an adjacent loops of bowel.  IMPRESSION: 1. No evidence of residual colovesical fistula. 2. Highly trabeculated urinary bladder with multiple small bladder wall diverticulae.   Electronically Signed   By: Vinnie Langton M.D.   On: 06/18/2014 12:15    Assessment/Plan:   Postoperative day #6 cystorrhaphy, sigmoid colectomy, doing well. Cystogram today revealed an intact bladder.    I removed the patient's catheter. I'll continue to follow, but he does not eat any specific urologic follow-up   LOS: 6 days   Franchot Gallo M 06/18/2014, 2:29 PM

## 2014-06-18 NOTE — Progress Notes (Signed)
Patient Profile: 81 y/o male with h/o chronic atrial fibrillation, CHF (EF 40-45%) and CAD. He underwent sigmoidectomy and repair of colovesicular fistula 5/27 by Dr Harlow Asa and Dr Diona Fanti. Post-op recovery complicated by rapid atrial fibrillation.   Subjective: Out of bed. Sitting in chair. No complaints. Denies palpitations, CP and dyspnea.  Objective: Vital signs in last 24 hours: Temp:  [97.7 F (36.5 C)-98.2 F (36.8 C)] 97.7 F (36.5 C) (06/02 0536) Pulse Rate:  [78-103] 96 (06/02 0536) Resp:  [16] 16 (06/02 0536) BP: (129-151)/(79-88) 140/85 mmHg (06/02 0536) SpO2:  [98 %-99 %] 99 % (06/02 0536) Last BM Date: 06/17/14  Intake/Output from previous day: 06/01 0701 - 06/02 0700 In: 3260 [P.O.:60; I.V.:3200] Out: 525 [Urine:525] Intake/Output this shift:    Medications Current Facility-Administered Medications  Medication Dose Route Frequency Provider Last Rate Last Dose  . antiseptic oral rinse (CPC / CETYLPYRIDINIUM CHLORIDE 0.05%) solution 7 mL  7 mL Mouth Rinse BID Armandina Gemma, MD   7 mL at 06/17/14 2134  . dextrose 5 % and 0.45 % NaCl with KCl 20 mEq/L infusion   Intravenous Continuous Leighton Ruff, MD 638 mL/hr at 06/18/14 0300    . enoxaparin (LOVENOX) injection 40 mg  40 mg Subcutaneous Q24H Armandina Gemma, MD   40 mg at 06/17/14 0930  . feeding supplement (RESOURCE BREEZE) (RESOURCE BREEZE) liquid 1 Container  1 Container Oral TID BM Clayton Bibles, RD   1 Container at 06/17/14 2008  . HYDROcodone-acetaminophen (NORCO/VICODIN) 5-325 MG per tablet 1-2 tablet  1-2 tablet Oral Q4H PRN Armandina Gemma, MD      . metoCLOPramide (REGLAN) injection 5 mg  5 mg Intravenous V5I PRN Leighton Ruff, MD   5 mg at 06/17/14 1141  . metoprolol (LOPRESSOR) injection 10 mg  10 mg Intravenous 4 times per day Leighton Ruff, MD   10 mg at 06/18/14 0556  . morphine 2 MG/ML injection 1-4 mg  1-4 mg Intravenous Q2H PRN Armandina Gemma, MD      . ondansetron [facility]) tablet 4 mg  4 mg Oral Q6H PRN  Armandina Gemma, MD       Or  . ondansetron [facility]) injection 4 mg  4 mg Intravenous Q6H PRN Armandina Gemma, MD   4 mg at 06/17/14 1457  . promethazine (PHENERGAN) injection 12.5 mg  12.5 mg Intravenous E3P PRN Leighton Ruff, MD   29.5 mg at 06/17/14 2342  . zolpidem (AMBIEN) tablet 5 mg  5 mg Oral QHS PRN Leighton Ruff, MD   5 mg at 06/17/14 2134    PE: General appearance: alert, cooperative and no distress Neck: no carotid bruit and no JVD Lungs: clear to auscultation bilaterally Heart: irregularly irregular rhythm and controlled rate Extremities: no LEE Pulses: 2+ and symmetric Skin: warm and dry Neurologic: Grossly normal  Lab Results:   Recent Labs  06/17/14 0450 06/18/14 0434  WBC 12.0* 9.7  HGB 14.0 13.3  HCT 41.6 39.5  PLT 210 218   BMET  Recent Labs  06/17/14 0450 06/18/14 0434  NA 138 138  K 3.3* 3.4*  CL 107 110  CO2 23 21*  GLUCOSE 146* 119*  BUN 16 14  CREATININE 1.07 0.99  CALCIUM 8.7* 8.4*    Assessment/Plan  Principal Problem:   Colovesical fistula Active Problems:   Sigmoid diverticulitis   1. Sigmoid Diverticulitis and Colovesical Fistula: s/p sigmoidectomy and repair of colovesicular fistula 5/27.  2. Chronic Atrial Fibrillation: HR controlled in the 90s. Asymptomatic. Still on clear  liquid diet and not on PO meds. Continue IV metoprolol 10 mg Q6H and transition to PO once he is able to tolerate orals. Resume anticoagulation with Xarelto when it's ok from a post-op bleeding perspective. Supplement K.    LOS: 6 days    Brittainy M. Rosita Fire, PA-C 06/18/2014 8:00 AM  Patient seen and examined and history reviewed. Agree with above findings and plan. Improving- now taking po's. HR 84 by my exam. We will go ahead and resume Toprol XL 50 mg daily. DC IV metoprolol. He is back on Xarelto. Will sign off now. Please call with problems/questions. Follow up with Dr. Burt Knack as outpatient for routine follow up.  Peter Martinique, [place] 06/18/2014 12:32  PM

## 2014-06-18 NOTE — Progress Notes (Signed)
Nutrition Follow-up  DOCUMENTATION CODES:  Obesity unspecified  INTERVENTION:  Boost Breeze TID RD to continue to monitor  NUTRITION DIAGNOSIS:  Inadequate oral intake related to inability to eat as evidenced by  (clear liquid diet). Now evidenced by full liquid diet.  Ongoing.  GOAL:  Patient will meet greater than or equal to 90% of their needs  Unmet.  MONITOR:  PO intake, Diet advancement, Supplement acceptance, Labs, Weight trends, Skin, I & O's  ASSESSMENT: 81 year old male presents for elective colectomy/cystorrhaphy for treatment of a colovesical fistula secondary to acute diverticulitis and a pericolonic abscess.  S/p 5/28 Procedure(s): SIGMOID COLECTOMY AND TAKEDOWN OF COLOVESICAL FISTULA (N/A) INCARCERATED UMBILICAL HERNIA REPAIR (N/A) CYSTORRHAPHY (N/A)  Pt's diet upgraded to full liquids. Pt continues to receive Resource Breeze supplements. RD attempted to speak with patient twice, first attempt patient was in radiology for cystogram. Second attempt patient speaking with staff member with curtain closed.  RD will attempt to follow-up at a later time.   Labs reviewed:  Low K  Height:  Ht Readings from Last 1 Encounters:  06/12/14 5\' 5"  (1.651 m)    Weight:  Wt Readings from Last 1 Encounters:  06/12/14 205 lb 14.6 oz (93.4 kg)    Ideal Body Weight:  61.8 kg  Wt Readings from Last 10 Encounters:  06/12/14 205 lb 14.6 oz (93.4 kg)  05/20/14 201 lb 12.8 oz (91.536 kg)  05/05/14 205 lb 1.9 oz (93.042 kg)  04/11/14 205 lb 7.5 oz (93.2 kg)  01/17/14 214 lb 8.1 oz (97.3 kg)  12/09/13 227 lb 12.8 oz (103.329 kg)  11/04/13 224 lb (101.606 kg)  09/04/13 225 lb (102.059 kg)  09/02/13 225 lb 3.2 oz (102.15 kg)  07/21/13 237 lb 9.6 oz (107.775 kg)    BMI:  Body mass index is 34.27 kg/(m^2).  Estimated Nutritional Needs:  Kcal:  1700-1900  Protein:  75-85g  Fluid:  1.7L/day    Skin:  Wound (see comment) (abdominal incision)  Diet  Order:  Diet full liquid Room service appropriate?: Yes; Fluid consistency:: Thin  EDUCATION NEEDS:  No education needs identified at this time   Intake/Output Summary (Last 24 hours) at 06/18/14 1347 Last data filed at 06/18/14 1011  Gross per 24 hour  Intake   3260 ml  Output    625 ml  Net   2635 ml    Last BM:  6/1  Clayton Bibles, MS, RD, LDN Pager: [phone] After Hours Pager: [phone]

## 2014-06-19 MED ORDER — METOPROLOL SUCCINATE ER 50 MG PO TB24
75.0000 mg | ORAL_TABLET | Freq: Every day | ORAL | Status: DC
  Administered 2014-06-19 – 2014-06-23 (×5): 75 mg via ORAL
  Filled 2014-06-19 (×5): qty 1

## 2014-06-19 MED ORDER — PANTOPRAZOLE SODIUM 40 MG PO TBEC
40.0000 mg | DELAYED_RELEASE_TABLET | Freq: Every day | ORAL | Status: DC
  Administered 2014-06-19 – 2014-06-23 (×5): 40 mg via ORAL
  Filled 2014-06-19 (×6): qty 1

## 2014-06-19 NOTE — Progress Notes (Signed)
Physical Therapy Treatment Patient Details Name: TAVIOUS GRIESINGER MRN: [id number] DOB: 04-Nov-1932 Today's Date: 06/19/2014    History of Present Illness 81 year old male presents for elective colectomy/cystorrhaphy for treatment of a colovesical fistula secondary to acute diverticulitis and a pericolonic abscess.  H/o HTN, CAD, obesity, cardiomyopathy, CHF, a.fib on xarelto, sleep apnea.    PT Comments    Patient progressing with ambulation, but both family and pt feel he needs extra time prior to going home and hopeful for SNF rehab.  Feel appropriate for patient given level of mobility and limited tolerance to activities.   Follow Up Recommendations  SNF     Equipment Recommendations  None recommended by PT    Recommendations for Other Services       Precautions / Restrictions Precautions Precautions: Fall Precaution Comments: abdominal incision    Mobility  Bed Mobility   Bed Mobility: Rolling;Sidelying to Sit;Supine to Sit Rolling: Supervision (no rail) Sidelying to sit: Min guard       General bed mobility comments: cues for technique as pt attempting to raise HOB to come to sitting  Transfers Overall transfer level: Needs assistance Equipment used: Rolling walker (2 wheeled) Transfers: Sit to/from Stand Sit to Stand: Min guard         General transfer comment: no real assistance needed, increased time  Ambulation/Gait Ambulation/Gait assistance: Supervision Ambulation Distance (Feet): 200 Feet Assistive device: Rolling walker (2 wheeled)     Gait velocity interpretation: Below normal speed for age/gender General Gait Details: cues for uprigjht posture, patient self limits ambulation due to pain, weakness (traded walkers for improved comfort)   Stairs            Wheelchair Mobility    Modified Rankin (Stroke Patients Only)       Balance     Sitting balance-[name] Scale: Good       Standing balance-[name] Scale: Fair Standing balance  comment: standing without UE support to perform hygiene after BM                    Cognition Arousal/Alertness: Awake/alert Behavior During Therapy: WFL for tasks assessed/performed Overall Cognitive Status: Within Functional Limits for tasks assessed                      Exercises      General Comments General comments (skin integrity, edema, etc.): Educated in importance of walking over weekend due to won't have therapy and nursing can assist to walk in hallway      Pertinent Vitals/Pain Pain Assessment: Faces Faces Pain Scale: Hurts a little bit Pain Location: abdomen Pain Descriptors / Indicators: Sore Pain Intervention(s): Monitored during session    Home Living                      Prior Function            PT Goals (current goals can now be found in the care plan section) Progress towards PT goals: Progressing toward goals    Frequency  Min 3X/week    PT Plan Discharge plan needs to be updated    Co-evaluation             End of Session Equipment Utilized During Treatment: Gait belt Activity Tolerance: Patient tolerated treatment well Patient left: with call bell/phone within reach;in bed     Time: 1350-1417 PT Time Calculation (min) (ACUTE ONLY): 27 min  Charges:  $Gait Training: 8-22 mins $  Therapeutic Activity: 8-22 mins                    G Codes:      WYNN,CYNDI 2014/07/13, 2:25 PM [place], [place] 07-13-14

## 2014-06-19 NOTE — Progress Notes (Signed)
CSW continuing to follow.   CSW followed up with pt at bedside to provide SNF bed offers. Pt requested CSW contact pt wife via telephone to discuss bed offers as well. CSW contacted pt wife via telephone to discuss bed offers. CSW provided bed offers and clarified pt wife questions.   Per pt and pt wife, MD notified them of anticipation for d/c on Monday.   CSW to follow up with pt regarding decision for rehab at [facility].   CSW to continue to follow to provide support and assist with transition to SNF when medically ready for discharge.  Alison Murray, MSW, [place] Work [phone]

## 2014-06-19 NOTE — Clinical Social Work Placement (Signed)
   CLINICAL SOCIAL WORK PLACEMENT  NOTE  Date:  06/19/2014  Patient Details  Name: DEMITRIOS MOLYNEUX MRN: [id number] Date of Birth: 01-04-1933  Clinical Social Work is seeking post-discharge placement for this patient at the [place] level of care (*CSW will initial, date and re-position this form in  chart as items are completed):  Yes   Patient/family provided with [place] Work Department's list of facilities offering this level of care within the geographic area requested by the patient (or if unable, by the patient's family).  Yes   Patient/family informed of their freedom to choose among providers that offer the needed level of care, that participate in Medicare, Medicaid or managed care program needed by the patient, have an available bed and are willing to accept the patient.  Yes   Patient/family informed of [facility]'s ownership interest in [facility] and [facility], as well as of the fact that they are under no obligation to receive care at these facilities.  PASRR submitted to EDS on       PASRR number received on       Existing PASRR number confirmed on 06/19/14     FL2 transmitted to all facilities in geographic area requested by pt/family on 06/19/14     FL2 transmitted to all facilities within larger geographic area on       Patient informed that his/her managed care company has contracts with or will negotiate with certain facilities, including the following:            Patient/family informed of bed offers received.  Patient chooses bed at       Physician recommends and patient chooses bed at      Patient to be transferred to   on  .  Patient to be transferred to facility by       Patient family notified on   of transfer.  Name of family member notified:        PHYSICIAN Please sign FL2     Additional Comment:    _______________________________________________ Ladell Pier, LCSW 06/19/2014, 10:28 AM

## 2014-06-19 NOTE — Progress Notes (Signed)
Pt refused nocturnal CPAP.  RT will monitor as needed.

## 2014-06-19 NOTE — Progress Notes (Signed)
Patient ID: Harold Jennings, male   DOB: 1932-03-13, 81 y.o.   MRN: [id number]  [facility]  POD#: 7  Subjective: Patient up in chair, eating breakfast of full liquids.  No emesis overnight.  Voiding.  Liquid BM's.  Objective: Vital signs in last 24 hours: Temp:  [97.4 F (36.3 C)-98.9 F (37.2 C)] 97.9 F (36.6 C) (06/03 0448) Pulse Rate:  [95-107] 107 (06/03 0448) Resp:  [16-18] 18 (06/03 0448) BP: (132-152)/(76-91) 136/91 mmHg (06/03 0448) SpO2:  [98 %-99 %] 98 % (06/03 0448) Last BM Date: 06/17/14  Intake/Output from previous day: 06/02 0701 - 06/03 0700 In: 1609.2 [I.V.:1609.2] Out: 100 [Urine:100] Intake/Output this shift:    Physical Exam: HEENT - sclerae clear, mucous membranes moist Neck - soft Chest - clear bilaterally Cor - RRR Abdomen - soft, mild distension Ext - no edema, non-tender Neuro - alert & oriented, no focal deficits  Lab Results:   Recent Labs  06/17/14 0450 06/18/14 0434  WBC 12.0* 9.7  HGB 14.0 13.3  HCT 41.6 39.5  PLT 210 218   BMET  Recent Labs  06/17/14 0450 06/18/14 0434  NA 138 138  K 3.3* 3.4*  CL 107 110  CO2 23 21*  GLUCOSE 146* 119*  BUN 16 14  CREATININE 1.07 0.99  CALCIUM 8.7* 8.4*   PT/INR No results for input(s): LABPROT, INR in the last 72 hours. Comprehensive Metabolic Panel:    Component Value Date/Time   NA 138 06/18/2014 0434   NA 138 06/17/2014 0450   K 3.4* 06/18/2014 0434   K 3.3* 06/17/2014 0450   CL 110 06/18/2014 0434   CL 107 06/17/2014 0450   CO2 21* 06/18/2014 0434   CO2 23 06/17/2014 0450   BUN 14 06/18/2014 0434   BUN 16 06/17/2014 0450   CREATININE 0.99 06/18/2014 0434   CREATININE 1.07 06/17/2014 0450   GLUCOSE 119* 06/18/2014 0434   GLUCOSE 146* 06/17/2014 0450   CALCIUM 8.4* 06/18/2014 0434   CALCIUM 8.7* 06/17/2014 0450   AST 22 04/09/2014 0540   AST 28 04/08/2014 2023   ALT 18 04/09/2014 0540   ALT 22 04/08/2014 2023   ALKPHOS 123*  04/09/2014 0540   ALKPHOS 134* 04/08/2014 2023   BILITOT 1.4* 04/09/2014 0540   BILITOT 0.9 04/08/2014 2023   PROT 6.1 04/09/2014 0540   PROT 6.9 04/08/2014 2023   ALBUMIN 3.2* 04/09/2014 0540   ALBUMIN 3.5 04/08/2014 2023    Studies/Results: Dg Cystogram  06/18/2014   CLINICAL DATA:  81 year old male with history of complicated diverticulitis with colovesical fistula. Status post surgical repair.  EXAM: CYSTOGRAM  TECHNIQUE: The bladder was filled with 200 mL Cysto-Hypaque 30% by drip infusion via the patient's indwelling Foley catheter. Serial spot images were obtained during bladder filling and post draining.  FLUOROSCOPY TIME:  6 minutes and 13 seconds  COMPARISON:  CT of the abdomen and pelvis 04/22/2014.  FINDINGS: Urinary bladder is highly trabeculated, with multiple small bladder wall diverticulae. At no point during the examination was there a fistulous communication identified between the bladder an adjacent loops of bowel.  IMPRESSION: 1. No evidence of residual colovesical fistula. 2. Highly trabeculated urinary bladder with multiple small bladder wall diverticulae.   Electronically Signed   By: Vinnie Langton M.D.   On: 06/18/2014 12:15    Anti-infectives: Anti-infectives    Start     Dose/Rate Route Frequency Ordered Stop   06/12/14 0618  ertapenem (INVANZ) 1 g  in sodium chloride 0.9 % 50 mL IVPB  Status:  Discontinued     1 g 100 mL/hr over 30 Minutes Intravenous On call to O.R. 06/12/14 0618 06/12/14 1240      Assessment & Plans: Status post sigmoid colectomy for colovesical fistula  Foley out per urology  Xarelto restarted  Full liquid diet today  OOB, ambulate Afib  Per cardiology Disposition  Patient asked about Rehab - will await PT recommendations  Earnstine Regal, MD, [facility] Office: [employer] 06/19/2014

## 2014-06-19 NOTE — Progress Notes (Signed)
Thank you for consult on Harold Jennings. He is currently at min guar assist for transfers and is ambulating at supervision level.  He's doing well with therapy and supervision with HHPT recommended for follow up. If wife unable to assist would recommend SNF for further therapy. Will defer CIR consult for now.

## 2014-06-19 NOTE — Progress Notes (Signed)
7 Days Post-Op Subjective: Patient reports bladder pressure/urgency/frequency. Apparently had prior scan showing no residual urine volume  Objective: Vital signs in last 24 hours: Temp:  [97.4 F (36.3 C)-98.9 F (37.2 C)] 97.9 F (36.6 C) (06/03 0448) Pulse Rate:  [95-107] 107 (06/03 0448) Resp:  [16-18] 18 (06/03 0448) BP: (132-152)/(76-91) 136/91 mmHg (06/03 0448) SpO2:  [98 %-99 %] 98 % (06/03 0448)  Intake/Output from previous day: 06/02 0701 - 06/03 0700 In: 1409.2 [I.V.:1409.2] Out: 100 [Urine:100] Intake/Output this shift: Total I/O In: 590.8 [I.V.:590.8] Out: -   Physical Exam:  Constitutional: Vital signs reviewed. WD WN in NAD   Eyes: PERRL, No scleral icterus.   Pulmonary/Chest: Normal effort Abdominal: Soft. Non-tender, non-distended, bowel sounds are normal, no masses, organomegaly, or guarding present. Wound C/D/I Genitourinary: Extremities: No cyanosis or edema   Lab Results:  Recent Labs  06/17/14 0450 06/18/14 0434  HGB 14.0 13.3  HCT 41.6 39.5   BMET  Recent Labs  06/17/14 0450 06/18/14 0434  NA 138 138  K 3.3* 3.4*  CL 107 110  CO2 23 21*  GLUCOSE 146* 119*  BUN 16 14  CREATININE 1.07 0.99  CALCIUM 8.7* 8.4*   No results for input(s): LABPT, INR in the last 72 hours. No results for input(s): LABURIN in the last 72 hours. Results for orders placed or performed during the hospital encounter of 06/12/14  MRSA PCR Screening     Status: Abnormal   Collection Time: 06/12/14  1:00 PM  Result Value Ref Range Status   MRSA by PCR POSITIVE (A) NEGATIVE Final    Comment:        The GeneXpert MRSA Assay (FDA approved for NASAL specimens only), is one component of a comprehensive MRSA colonization surveillance program. It is not intended to diagnose MRSA infection nor to guide or monitor treatment for MRSA infections. RESULT CALLED TO, READ BACK BY AND VERIFIED WITH: [name] [id number] @ 1440 BY J SCOTTON     Studies/Results: Dg  Cystogram  06/18/2014   CLINICAL DATA:  81 year old male with history of complicated diverticulitis with colovesical fistula. Status post surgical repair.  EXAM: CYSTOGRAM  TECHNIQUE: The bladder was filled with 200 mL Cysto-Hypaque 30% by drip infusion via the patient's indwelling Foley catheter. Serial spot images were obtained during bladder filling and post draining.  FLUOROSCOPY TIME:  6 minutes and 13 seconds  COMPARISON:  CT of the abdomen and pelvis 04/22/2014.  FINDINGS: Urinary bladder is highly trabeculated, with multiple small bladder wall diverticulae. At no point during the examination was there a fistulous communication identified between the bladder an adjacent loops of bowel.  IMPRESSION: 1. No evidence of residual colovesical fistula. 2. Highly trabeculated urinary bladder with multiple small bladder wall diverticulae.   Electronically Signed   By: Vinnie Langton M.D.   On: 06/18/2014 12:15    Assessment/Plan:   S/p colectomy/colovesical fistula repair. Now has urgency/bladder pressure /frequency.  Will r/o uti--urinalysis Loperamide may help bladder sx's.  Bladder scan to r/o retention   LOS: 7 days   Franchot Gallo M 06/19/2014, 6:20 AM

## 2014-06-19 NOTE — Clinical Social Work Note (Signed)
Clinical Social Work Assessment  Patient Details  Name: Harold Jennings MRN: [id number] Date of Birth: 1932/10/07  Date of referral:  06/19/14               Reason for consult:  Discharge Planning                Permission sought to share information with:    Permission granted to share information::  No  Name::        Agency::  [facility] SNF search  Relationship::     Contact Information:     Housing/Transportation Living arrangements for the past 2 months:  Single Family Home Source of Information:  Patient Patient Interpreter Needed:  None Criminal Activity/Legal Involvement Pertinent to Current Situation/Hospitalization:  No - Comment as needed Significant Relationships:  Spouse Lives with:  Spouse Do you feel safe going back to the place where you live?  No Need for family participation in patient care:  No (Coment)  Care giving concerns:  Pt lives at home with pt wife. Pt continues to feel weak and inquiring about rehab at SNF prior to returning home.   Social Worker assessment / plan:  CSW received referral that pt feeling weak and asking about rehab at SNF prior to returning home. PT recommending Home Health PT and OT recommending SNF.   CSW met with pt at bedside. CSW introduced self and explained role. Pt reports that he lives at home with his wife. CSW provided support as pt expressed that he feels weak and is concerned about not being independent in order to return home. CSW discussed short term rehab at SNF and pt stated that he is interested in exploring option, but has not yet decided if that will be his choice for disposition. Pt states, I will go if it is "needed". CSW provided education surrounding benefits of SNF in order for pt to get stronger and be less dependent on assistance from pt wife upon discharge home. Pt states, "my wife is what matters." Pt agreeable to [facility] search.   CSW completed FL2 and initiated SNF search to [facility].    CSW to follow up with pt regarding SNF bed offers in order for pt to have options to make an informed decision about disposition plan.   CSW to continue to follow to provide support and assist with pt discharge planning needs.   Employment status:  Retired Health visitor PT Recommendations:  Home with [place], [place] / Referral to community resources:  [place]  Patient/Family's Response to care:  Pt alert and oriented x 4. Pt pleasant and actively involved in conversation. Pt is concerned about being a burden to his wife as he has not yet progressed to being completely independent. Pt considering option of short term SNF upon discharge.   Patient/Family's Understanding of and Emotional Response to Diagnosis, Current Treatment, and Prognosis:  Pt understanding of diagnosis, current treatment, and plan of care. Pt hoping to get stronger, but recognizes that short term rehab may be beneficial prior to pt returning home.   Emotional Assessment Appearance:  Appears stated age Attitude/Demeanor/Rapport:  Other (pt appropriate) Affect (typically observed):  Pleasant Orientation:  Oriented to Self, Oriented to Place, Oriented to  Time, Oriented to Situation Alcohol / Substance use:  Not Applicable Psych involvement (Current and /or in the community):  No (Comment)  Discharge Needs  Concerns to be addressed:  Discharge Planning Concerns Readmission within  the last 30 days:  No Current discharge risk:  None Barriers to Discharge:  No Barriers Identified   KIDD, [place], LCSW 06/19/2014, 10:22 AM  [phone]

## 2014-06-19 NOTE — Progress Notes (Addendum)
Occupational Therapy Treatment Patient Details Name: Harold Jennings MRN: [id number] DOB: [birth year] Today's Date: 06/19/2014    History of present illness 81 year old male presents for elective colectomy/cystorrhaphy for treatment of a colovesical fistula secondary to acute diverticulitis and a pericolonic abscess.  H/o HTN, CAD, obesity, cardiomyopathy, CHF, a.fib on xarelto, sleep apnea.   OT comments  Pt up to EOB to perform sponge bathing task. He requires min assist for trunk to upright sitting. He is slow to perform tasks and states he does feel some overall weakness/not fully back to baseline. Had a long discussion regarding d/c plan with pt. He doesn't want to d/c to home "too soon" and has concerns about going directly home as his wife is unable to physically assist him. Overall he is at min to min guard assist level. If he does end up d/c home, do recommend HHOT followup. Needs some encouragement to continue to try to do as much for himself as possible to build strength and pt verbalized understanding of why OT was requesting he do as much of self care for himself as he can.   Follow Up Recommendations  Supervision/Assistance - 24 hour;SNF;Other (comment) (pt states wife cant assist physically.) Recommend HHOT if d/c home.     Equipment Recommendations  None recommended by OT    Recommendations for Other Services      Precautions / Restrictions Precautions Precautions: Fall Precaution Comments: abdominal incision       Mobility Bed Mobility Overal bed mobility: Needs Assistance Bed Mobility: Rolling;Sidelying to Sit;Sit to Supine Rolling: Supervision Sidelying to sit: Min assist   Sit to supine: Min guard   General bed mobility comments: min assist for trunk to upright.   Transfers Overall transfer level: Needs assistance Equipment used: Rolling walker (2 wheeled) Transfers: Sit to/from Stand Sit to Stand: Min guard         General transfer comment: close guard  for safety.     Balance                                   ADL       Grooming: Wash/dry hands;Min guard;Standing       Lower Body Bathing: Minimal assistance;Sit to/from stand; for his feet. Educated on LHS option.       Lower Body Dressing: Total assistance with socks; will require AE versus assist as he cant fully reach to feet.   Toilet Transfer: Min guard;Ambulation;Comfort height toilet;RW   Toileting- Water quality scientist and Hygiene: Min guard;Sit to/from stand         General ADL Comments: Educated further on AE options. Pt is able to wash down to ankle area bilaterally but would benefit from [facility] and educated him on this option. He would also benefit from a reacher to don pants and doff socks and explained use of AE. Would benefit from practice. Pt is slow with performing tasks and needs cues/encouragement at times to do as much of his bathing as he can do for himself.  he continues to have some difficulty with bed mobility and needs assist for bringing trunk to upright. Pt states he does not want to be a burden to his wife and is asking about possible SNF to increase independence. Total assist with socks without AE as pt not able to fully reach down to feet to don socks.       Vision  Perception     Praxis      Cognition   Behavior During Therapy: WFL for tasks assessed/performed Overall Cognitive Status: Within Functional Limits for tasks assessed                       Extremity/Trunk Assessment               Exercises     Shoulder Instructions       General Comments      Pertinent Vitals/ Pain       Pain Location: pt didnt rate "sore" abdomen Pain Intervention(s): Repositioned  Home Living                                          Prior Functioning/Environment              Frequency Min 2X/week     Progress Toward Goals  OT Goals(current goals can now be found in  the care plan section)  Progress towards OT goals: Progressing toward goals     Plan Discharge plan needs to be updated    Co-evaluation                 End of Session Equipment Utilized During Treatment: Rolling walker   Activity Tolerance Patient tolerated treatment well   Patient Left in bed;with call bell/phone within reach   Nurse Communication          Time: 7014-1030 OT Time Calculation (min): 27 min  Charges: OT Treatments $Self Care/Home Management : 8-22 mins $Therapeutic Activity: 8-22 mins  Jules Schick  [phone] 06/19/2014, 9:56 AM

## 2014-06-19 NOTE — Clinical Social Work Placement (Signed)
   CLINICAL SOCIAL WORK PLACEMENT  NOTE  Date:  06/19/2014  Patient Details  Name: JOAKIM HUESMAN MRN: [id number] Date of Birth: July 31, 1932  Clinical Social Work is seeking post-discharge placement for this patient at the [place] level of care (*CSW will initial, date and re-position this form in  chart as items are completed):  Yes   Patient/family provided with [place] Work Department's list of facilities offering this level of care within the geographic area requested by the patient (or if unable, by the patient's family).  Yes   Patient/family informed of their freedom to choose among providers that offer the needed level of care, that participate in Medicare, Medicaid or managed care program needed by the patient, have an available bed and are willing to accept the patient.  Yes   Patient/family informed of [facility]'s ownership interest in [facility] and [facility], as well as of the fact that they are under no obligation to receive care at these facilities.  PASRR submitted to EDS on       PASRR number received on       Existing PASRR number confirmed on 06/19/14     FL2 transmitted to all facilities in geographic area requested by pt/family on 06/19/14     FL2 transmitted to all facilities within larger geographic area on       Patient informed that his/her managed care company has contracts with or will negotiate with certain facilities, including the following:        Yes   Patient/family informed of bed offers received.  Patient chooses bed at       Physician recommends and patient chooses bed at      Patient to be transferred to   on  .  Patient to be transferred to facility by       Patient family notified on   of transfer.  Name of family member notified:        PHYSICIAN Please sign FL2     Additional Comment:    _______________________________________________ Ladell Pier, LCSW 06/19/2014, 2:54 PM

## 2014-06-20 LAB — CLOSTRIDIUM DIFFICILE BY PCR: Toxigenic C. Difficile by PCR: NEGATIVE

## 2014-06-20 MED ORDER — LOPERAMIDE HCL 2 MG PO CAPS
4.0000 mg | ORAL_CAPSULE | Freq: Once | ORAL | Status: AC
  Administered 2014-06-20: 4 mg via ORAL
  Filled 2014-06-20: qty 2

## 2014-06-20 NOTE — Progress Notes (Signed)
Patient ID: Harold Jennings, male   DOB: 1932-05-11, 81 y.o.   MRN: [id number]  [facility]  POD#: 8  Subjective: Patient up in chair.  Complains about diarrheal stools every 1 1/2 hours.  No nausea or emesis.  Objective: Vital signs in last 24 hours: Temp:  [97.7 F (36.5 C)-98 F (36.7 C)] 97.7 F (36.5 C) (06/04 0400) Pulse Rate:  [54-93] 88 (06/04 0400) Resp:  [16] 16 (06/04 0400) BP: (135-144)/(75-96) 138/96 mmHg (06/04 0400) SpO2:  [97 %-100 %] 97 % (06/04 0400) Last BM Date: 06/17/14  Intake/Output from previous day: 06/03 0701 - 06/04 0700 In: 775 [I.V.:775] Out: 0  Intake/Output this shift:    Physical Exam: HEENT - sclerae clear, mucous membranes moist Neck - soft Chest - clear bilaterally Cor - RRR Abdomen - softer, BS present; wound dry and intact Ext - no edema, non-tender Neuro - alert & oriented, no focal deficits  Lab Results:   Recent Labs  06/18/14 0434  WBC 9.7  HGB 13.3  HCT 39.5  PLT 218   BMET  Recent Labs  06/18/14 0434  NA 138  K 3.4*  CL 110  CO2 21*  GLUCOSE 119*  BUN 14  CREATININE 0.99  CALCIUM 8.4*   PT/INR No results for input(s): LABPROT, INR in the last 72 hours. Comprehensive Metabolic Panel:    Component Value Date/Time   NA 138 06/18/2014 0434   NA 138 06/17/2014 0450   K 3.4* 06/18/2014 0434   K 3.3* 06/17/2014 0450   CL 110 06/18/2014 0434   CL 107 06/17/2014 0450   CO2 21* 06/18/2014 0434   CO2 23 06/17/2014 0450   BUN 14 06/18/2014 0434   BUN 16 06/17/2014 0450   CREATININE 0.99 06/18/2014 0434   CREATININE 1.07 06/17/2014 0450   GLUCOSE 119* 06/18/2014 0434   GLUCOSE 146* 06/17/2014 0450   CALCIUM 8.4* 06/18/2014 0434   CALCIUM 8.7* 06/17/2014 0450   AST 22 04/09/2014 0540   AST 28 04/08/2014 2023   ALT 18 04/09/2014 0540   ALT 22 04/08/2014 2023   ALKPHOS 123* 04/09/2014 0540   ALKPHOS 134* 04/08/2014 2023   BILITOT 1.4* 04/09/2014 0540   BILITOT 0.9  04/08/2014 2023   PROT 6.1 04/09/2014 0540   PROT 6.9 04/08/2014 2023   ALBUMIN 3.2* 04/09/2014 0540   ALBUMIN 3.5 04/08/2014 2023    Studies/Results: Dg Cystogram  06/18/2014   CLINICAL DATA:  81 year old male with history of complicated diverticulitis with colovesical fistula. Status post surgical repair.  EXAM: CYSTOGRAM  TECHNIQUE: The bladder was filled with 200 mL Cysto-Hypaque 30% by drip infusion via the patient's indwelling Foley catheter. Serial spot images were obtained during bladder filling and post draining.  FLUOROSCOPY TIME:  6 minutes and 13 seconds  COMPARISON:  CT of the abdomen and pelvis 04/22/2014.  FINDINGS: Urinary bladder is highly trabeculated, with multiple small bladder wall diverticulae. At no point during the examination was there a fistulous communication identified between the bladder an adjacent loops of bowel.  IMPRESSION: 1. No evidence of residual colovesical fistula. 2. Highly trabeculated urinary bladder with multiple small bladder wall diverticulae.   Electronically Signed   By: Vinnie Langton M.D.   On: 06/18/2014 12:15    Anti-infectives: Anti-infectives    Start     Dose/Rate Route Frequency Ordered Stop   06/12/14 0618  ertapenem (INVANZ) 1 g in sodium chloride 0.9 % 50 mL IVPB  Status:  Discontinued  1 g 100 mL/hr over 30 Minutes Intravenous On call to O.R. 06/12/14 0618 06/12/14 1240      Assessment & Plans: Status post sigmoid colectomy for colovesical fistula  Advance to regular diet  Foley out Diarrhea  Sample sent to lab this AM  Will give one dose loperamide this AM  Precautions in place Afib  On Xarelto  Per cardiology  Anticipate discharge to SNF for Rehab early this week.  FL-2 signed and on chart.  Earnstine Regal, MD, [facility] Office: [place] 06/20/2014

## 2014-06-20 NOTE — Plan of Care (Signed)
Problem: Phase III Progression Outcomes Goal: Activity at appropriate level-compared to baseline (UP IN CHAIR FOR HEMODIALYSIS)  Outcome: Progressing Pt ambulating to BR with RW and CGA.  Amb 100' yesterday.  Will encourage amb today.  D/C plan is for SNF rehab, per pt.

## 2014-06-20 NOTE — Progress Notes (Signed)
Urology Progress Note  8 Days Post-Op   Subjective: Catheter out, but pt continues to c/o diarrhea. Unable to void lying down, and needs help standing to void.     No acute urologic events overnight. Ambulation:   positive Flatus:    positive Bowel movement  positive  Pain: minimal  Objective:  Blood pressure 138/96, pulse 88, temperature 97.7 F (36.5 C), temperature source Oral, resp. rate 16, height 5\' 5"  (1.651 m), weight 93.4 kg (205 lb 14.6 oz), SpO2 97 %.  Physical Exam:  General:  No acute distress, awake  Genitourinary: soft. Non-distended Foley:  out    I/O last 3 completed shifts: In: 1565.8 [I.V.:1565.8] Out: 0   Recent Labs     06/18/14  0434  HGB  13.3  WBC  9.7  PLT  218    Recent Labs     06/18/14  0434  NA  138  K  3.4*  CL  110  CO2  21*  BUN  14  CREATININE  0.99  CALCIUM  8.4*  GFRNONAA  >60  GFRAA  >60     CLINICAL DATA: 81 year old male with history of complicated diverticulitis with colovesical fistula. Status post surgical repair.  EXAM: CYSTOGRAM  TECHNIQUE: The bladder was filled with 200 mL Cysto-Hypaque 30% by drip infusion via the patient's indwelling Foley catheter. Serial spot images were obtained during bladder filling and post draining.  FLUOROSCOPY TIME: 6 minutes and 13 seconds  COMPARISON: CT of the abdomen and pelvis 04/22/2014.  FINDINGS: Urinary bladder is highly trabeculated, with multiple small bladder wall diverticulae. At no point during the examination was there a fistulous communication identified between the bladder an adjacent loops of bowel.  IMPRESSION: 1. No evidence of residual colovesical fistula. 2. Highly trabeculated urinary bladder with multiple small bladder wall diverticulae.   Electronically Signed  By: Vinnie Langton M.D.  On: 06/18/2014 12:15       Assessment/Plan: Stable urologically, but c/o diarrhea.   Follow up per Dr. Dorina Hoyer. Inform RN re:  diarrhea. On C.Dif. Precautions.

## 2014-06-21 MED ORDER — LOPERAMIDE HCL 2 MG PO CAPS
4.0000 mg | ORAL_CAPSULE | Freq: Once | ORAL | Status: AC
  Administered 2014-06-21: 4 mg via ORAL
  Filled 2014-06-21: qty 2

## 2014-06-21 NOTE — Progress Notes (Signed)
Patient ID: Harold Jennings, male   DOB: Oct 12, 1932, 81 y.o.   MRN: [id number]  [facility]  POD#: 9  Subjective: Patient taking full liquids, less diarrhea.  Frequent urination.  No nausea or emesis.  Objective: Vital signs in last 24 hours: Temp:  [97.6 F (36.4 C)-98.6 F (37 C)] 97.6 F (36.4 C) (06/05 0614) Pulse Rate:  [87-102] 102 (06/05 0614) Resp:  [16] 16 (06/05 0614) BP: (125-143)/(64-74) 132/72 mmHg (06/05 0614) SpO2:  [100 %] 100 % (06/05 0614) Last BM Date: 06/20/14  Intake/Output from previous day: 06/04 0701 - 06/05 0700 In: 2940 [P.O.:1740; I.V.:1200] Out: -  Intake/Output this shift:    Physical Exam: HEENT - sclerae clear, mucous membranes moist Chest - clear bilaterally Cor - RRR Abdomen - soft, wound dry and intact - dressing removed Ext - no edema, non-tender Neuro - alert & oriented, no focal deficits  Lab Results:  No results for input(s): WBC, HGB, HCT, PLT in the last 72 hours. BMET No results for input(s): NA, K, CL, CO2, GLUCOSE, BUN, CREATININE, CALCIUM in the last 72 hours. PT/INR No results for input(s): LABPROT, INR in the last 72 hours. Comprehensive Metabolic Panel:    Component Value Date/Time   NA 138 06/18/2014 0434   NA 138 06/17/2014 0450   K 3.4* 06/18/2014 0434   K 3.3* 06/17/2014 0450   CL 110 06/18/2014 0434   CL 107 06/17/2014 0450   CO2 21* 06/18/2014 0434   CO2 23 06/17/2014 0450   BUN 14 06/18/2014 0434   BUN 16 06/17/2014 0450   CREATININE 0.99 06/18/2014 0434   CREATININE 1.07 06/17/2014 0450   GLUCOSE 119* 06/18/2014 0434   GLUCOSE 146* 06/17/2014 0450   CALCIUM 8.4* 06/18/2014 0434   CALCIUM 8.7* 06/17/2014 0450   AST 22 04/09/2014 0540   AST 28 04/08/2014 2023   ALT 18 04/09/2014 0540   ALT 22 04/08/2014 2023   ALKPHOS 123* 04/09/2014 0540   ALKPHOS 134* 04/08/2014 2023   BILITOT 1.4* 04/09/2014 0540   BILITOT 0.9 04/08/2014 2023   PROT 6.1 04/09/2014 0540   PROT  6.9 04/08/2014 2023   ALBUMIN 3.2* 04/09/2014 0540   ALBUMIN 3.5 04/08/2014 2023    Studies/Results: No results found.  Anti-infectives: Anti-infectives    Start     Dose/Rate Route Frequency Ordered Stop   06/12/14 0618  ertapenem (INVANZ) 1 g in sodium chloride 0.9 % 50 mL IVPB  Status:  Discontinued     1 g 100 mL/hr over 30 Minutes Intravenous On call to O.R. 06/12/14 0618 06/12/14 1240      Assessment & Plans: Status post sigmoid colectomy for colovesical fistula Advance to regular diet Foley out Diarrhea PCR negative for Cdiff Will give one dose loperamide this AM Afib On Xarelto Per cardiology Urinary frequency  Per urology  Earnstine Regal, MD, [facility] Office: [place] 06/21/2014

## 2014-06-21 NOTE — Clinical Social Work Note (Signed)
CSW left message for pt's spouse to determine if she has decided on a specific SNF for pt at discharge  CSW will continue to follow up with pt until discharge  .Dede Query, LCSW [facility] Clinical Social Worker - Weekend Coverage cell #: [phone]

## 2014-06-21 NOTE — Plan of Care (Signed)
Problem: Phase III Progression Outcomes Goal: Activity at appropriate level-compared to baseline (UP IN CHAIR FOR HEMODIALYSIS)  Outcome: Completed/Met Date Met:  06/21/14 Sufficient for d/c to rehab.

## 2014-06-21 NOTE — Progress Notes (Signed)
ANTICOAGULATION CONSULT NOTE - Follow up  Pharmacy Consult for Xarelto Indication: atrial fibrillation  No Known Allergies  Patient Measurements: Height: 5\' 5"  (165.1 cm) Weight: 205 lb 14.6 oz (93.4 kg) IBW/kg (Calculated) : 61.5   Labs: No results for input(s): HGB, HCT, PLT, APTT, LABPROT, INR, HEPARINUNFRC, CREATININE, CKTOTAL, CKMB, TROPONINI in the last 72 hours.  Estimated Creatinine Clearance: 62.5 mL/min (by C-G formula based on Cr of 0.99).  Medications, Scheduled:  . antiseptic oral rinse  7 mL Mouth Rinse BID  . feeding supplement (RESOURCE BREEZE)  1 Container Oral TID BM  . metoprolol succinate  75 mg Oral Daily  . pantoprazole  40 mg Oral Daily  . rivaroxaban  20 mg Oral Q supper   Assessment: 81 y/o male admitted 5/27 for sigmoidectomy and repair of colovesicular fistula 5/27 by Dr Harlow Asa and Dr Diona Fanti. PMH includes chronic atrial fibrillation on Xarelto anticoagulation, CHF (EF 40-45%) and CAD.  Xarelto held prior to procedure.  Pharmacy was consulted to resume Xarelto dosing.  Home Xarelto was dose-reduced to Xarelto 15 mg PO daily.  Reduced dose was started January, 2016 when CrCl < 50 ml/min.    Today, 06/21/2014:  CBC: Hgb/PLt wnl  Major drug interactions: none  No bleeding issues per nursing  SCr 0.99, CrCl 78 ml/min CG (using TBW as recommended with Xarelto)  Goal of Therapy: Prevention of thromboembolism  Plan:  Continue Xarelto 20 mg daily with supper.    Recommend continuing full-dose Xarelto 20 mg PO daily at discharge, instead of PTA dose of 15mg .  Renal function is now improved.  Ensure adequate cardiology follow-up (Cardiologist is Dr. Sherren Mocha)  CBC at least q72 hr while on Xarelto  Monitor for signs of bleeding or thrombosis  Gretta Arab PharmD, BCPS Pager [phone] 06/21/2014 2:04 PM

## 2014-06-22 LAB — URINALYSIS, ROUTINE W REFLEX MICROSCOPIC
Bilirubin Urine: NEGATIVE
GLUCOSE, UA: NEGATIVE mg/dL
KETONES UR: NEGATIVE mg/dL
Nitrite: NEGATIVE
Protein, ur: NEGATIVE mg/dL
Specific Gravity, Urine: 1.015 (ref 1.005–1.030)
Urobilinogen, UA: 0.2 mg/dL (ref 0.0–1.0)
pH: 5.5 (ref 5.0–8.0)

## 2014-06-22 LAB — URINE MICROSCOPIC-ADD ON

## 2014-06-22 MED ORDER — FINASTERIDE 5 MG PO TABS
5.0000 mg | ORAL_TABLET | Freq: Every day | ORAL | Status: DC
  Administered 2014-06-23: 5 mg via ORAL
  Filled 2014-06-22 (×4): qty 1

## 2014-06-22 MED ORDER — LOPERAMIDE HCL 2 MG PO CAPS
4.0000 mg | ORAL_CAPSULE | Freq: Three times a day (TID) | ORAL | Status: AC
  Administered 2014-06-22 (×3): 4 mg via ORAL
  Filled 2014-06-22 (×3): qty 2

## 2014-06-22 NOTE — Care Management Note (Signed)
Case Management Note  Patient Details  Name: Harold Jennings MRN: [id number] Date of Birth: November 21, 1932  Subjective/Objective:             Admitted s/p        Action/Plan: Disharge planning, not wants SNF placement. CSW following, will continue to follow in case plans change.   Expected Discharge Date:                  Expected Discharge Plan:  Skilled Nursing Facility  In-House Referral:  Clinical Social Work  Discharge planning Services  CM Consult  Post Acute Care Choice:    Choice offered to:  Patient  DME Arranged:    DME Agency:     HH Arranged:    [place] Agency:     Status of Service:  Completed, signed off  Medicare Important Message Given:  Yes Date Medicare IM Given:  06/22/14 Medicare IM give by:  Sunday Spillers RN CM  Date Additional Medicare IM Given:    Additional Medicare Important Message give by:     If discussed at [place] of Stay Meetings, dates discussed:    Additional Comments:  Guadalupe Maple, RN 06/22/2014, 2:25 PM

## 2014-06-22 NOTE — Progress Notes (Signed)
Pt refuses to wear CPAP and has been so the past several nights. No distress noted.

## 2014-06-22 NOTE — Progress Notes (Signed)
Patient ID: Harold Jennings, male   DOB: 12-14-1932, 81 y.o.   MRN: [id number]  [facility]  POD#: 10  Subjective: Patient up in chair.  Tolerating regular diet.  Frequent voiding and stools.  Pain with urination.  Loose BM's persist.  Objective: Vital signs in last 24 hours: Temp:  [98.1 F (36.7 C)-98.5 F (36.9 C)] 98.5 F (36.9 C) (06/06 0652) Pulse Rate:  [85-105] 105 (06/06 0652) Resp:  [16-22] 20 (06/06 0652) BP: (124-155)/(71-120) 149/72 mmHg (06/06 0652) SpO2:  [97 %-99 %] 98 % (06/06 0652) Last BM Date: 06/21/14  Intake/Output from previous day: 06/05 0701 - 06/06 0700 In: 1000 [P.O.:400; I.V.:600] Out: -  Intake/Output this shift:    Physical Exam: HEENT - sclerae clear, mucous membranes moist Neck - soft Chest - clear bilaterally Cor - RRR Abdomen - softer, minimally distended; BS present; wound dry and intact with staples in place Ext - no edema, non-tender Neuro - alert & oriented, no focal deficits  Lab Results:  No results for input(s): WBC, HGB, HCT, PLT in the last 72 hours. BMET No results for input(s): NA, K, CL, CO2, GLUCOSE, BUN, CREATININE, CALCIUM in the last 72 hours. PT/INR No results for input(s): LABPROT, INR in the last 72 hours. Comprehensive Metabolic Panel:    Component Value Date/Time   NA 138 06/18/2014 0434   NA 138 06/17/2014 0450   K 3.4* 06/18/2014 0434   K 3.3* 06/17/2014 0450   CL 110 06/18/2014 0434   CL 107 06/17/2014 0450   CO2 21* 06/18/2014 0434   CO2 23 06/17/2014 0450   BUN 14 06/18/2014 0434   BUN 16 06/17/2014 0450   CREATININE 0.99 06/18/2014 0434   CREATININE 1.07 06/17/2014 0450   GLUCOSE 119* 06/18/2014 0434   GLUCOSE 146* 06/17/2014 0450   CALCIUM 8.4* 06/18/2014 0434   CALCIUM 8.7* 06/17/2014 0450   AST 22 04/09/2014 0540   AST 28 04/08/2014 2023   ALT 18 04/09/2014 0540   ALT 22 04/08/2014 2023   ALKPHOS 123* 04/09/2014 0540   ALKPHOS 134* 04/08/2014 2023   BILITOT 1.4* 04/09/2014 0540   BILITOT 0.9 04/08/2014 2023   PROT 6.1 04/09/2014 0540   PROT 6.9 04/08/2014 2023   ALBUMIN 3.2* 04/09/2014 0540   ALBUMIN 3.5 04/08/2014 2023    Studies/Results: No results found.  Anti-infectives: Anti-infectives    Start     Dose/Rate Route Frequency Ordered Stop   06/12/14 0618  ertapenem (INVANZ) 1 g in sodium chloride 0.9 % 50 mL IVPB  Status:  Discontinued     1 g 100 mL/hr over 30 Minutes Intravenous On call to O.R. 06/12/14 0618 06/12/14 1240      Assessment & Plans: Status post sigmoid colectomy for colovesical fistula Regular diet Foley out Diarrhea PCR negative for Cdiff Will give loperamide TID today Afib On Xarelto Per cardiology Urinary frequency / discomfort Per urology - will call Dr. Diona Fanti this AM  Earnstine Regal, MD, [facility] Office: [place] 06/22/2014

## 2014-06-22 NOTE — Progress Notes (Signed)
CSW continuing to follow.   Pt and pt wife have chosen bed at [facility].   Per MD, pt not yet medically ready for discharge today.  CSW met with pt at bedside to provide support.   CSW updated [employer].   CSW to continue to follow to provide support and assist with pt discharge needs.   Alison Murray, MSW, [name] Work [phone]

## 2014-06-22 NOTE — Progress Notes (Signed)
Occupational Therapy Treatment Patient Details Name: Harold Jennings MRN: [id number] DOB: [birth year] Today's Date: 06/22/2014    History of present illness 81 year old male presents for elective colectomy/cystorrhaphy for treatment of a colovesical fistula secondary to acute diverticulitis and a pericolonic abscess.  H/o HTN, CAD, obesity, cardiomyopathy, CHF, a.fib on xarelto, sleep apnea.   OT comments  Pt educated further on AE options for self care. Pt states he is interested in obtaining AE kit from gift shop and aware of coverage. Will follow. Pt planning SNF.    Follow Up Recommendations  Supervision/Assistance - 24 hour;SNF;Other (comment)    Equipment Recommendations  None recommended by OT    Recommendations for Other Services      Precautions / Restrictions Precautions Precautions: Fall Precaution Comments: abdominal incision Restrictions Weight Bearing Restrictions: No       Mobility Bed Mobility               General bed mobility comments: NT-up on EOB  Transfers                Balance                                   ADL                                         General ADL Comments: Pt just finished with PT and declines need for getting up to the bathroom right now for any ADL. He did agree to work on AE training. Educated pt and wife on all AE options and how to use reacher to doff socks, don pants and undwear. pt found this information to be helpful as he wasnt aware of how AE could be used to dress. Practiced with sock aid to don R sock with min assist and min cues. Wife states she thinks they may have a sock aid at home but isnt sure. Pt interested in all AE so they state they will likely obtain whole kit from gift shop. Educated on long shoe horn and sponge options also.       Vision                     Perception     Praxis      Cognition   Behavior During Therapy: WFL for tasks  assessed/performed Overall Cognitive Status: Within Functional Limits for tasks assessed                       Extremity/Trunk Assessment               Exercises     Shoulder Instructions       General Comments      Pertinent Vitals/ Pain       Pain Assessment: No/denies pain  Home Living                                          Prior Functioning/Environment              Frequency Min 2X/week     Progress Toward Goals  OT Goals(current goals can now be found in the care plan section)  Progress towards OT goals: Progressing toward goals  Acute Rehab OT Goals 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

## 2014-06-22 NOTE — Progress Notes (Signed)
[name], c/o  urine frequency with small amount of urine out each time, .  Notified MD on call and Finasteride ordered and restarted.  Harold Jennings 06/22/14  [phone]

## 2014-06-22 NOTE — Progress Notes (Signed)
Physical Therapy Treatment Patient Details Name: Harold Jennings MRN: [id number] DOB: 31-Mar-1932 Today's Date: 06/22/2014    History of Present Illness 81 year old male presents for elective colectomy/cystorrhaphy for treatment of a colovesical fistula secondary to acute diverticulitis and a pericolonic abscess.  H/o HTN, CAD, obesity, cardiomyopathy, CHF, a.fib on xarelto, sleep apnea.    PT Comments    Progressing well with mobility. Pt walked 300' with RW today, no loss of balance, improved velocity. Pt reported difficulty with urination, urinalysis pending.   Follow Up Recommendations  SNF     Equipment Recommendations  None recommended by PT    Recommendations for Other Services       Precautions / Restrictions Precautions Precautions: Fall Precaution Comments: abdominal incision Restrictions Weight Bearing Restrictions: No    Mobility  Bed Mobility               General bed mobility comments: NT-up on EOB  Transfers Overall transfer level: Modified independent Equipment used: Rolling walker (2 wheeled) Transfers: Sit to/from Stand Sit to Stand: Modified independent (Device/Increase time)         General transfer comment: good hand placement  Ambulation/Gait Ambulation/Gait assistance: Supervision Ambulation Distance (Feet): 300 Feet Assistive device: Rolling walker (2 wheeled) Gait Pattern/deviations: WFL(Within Functional Limits)   Gait velocity interpretation: at or above normal speed for age/gender General Gait Details: steady with RW, cues to lift head due to flexed neck posture, good velocity   Stairs            Wheelchair Mobility    Modified Rankin (Stroke Patients Only)       Balance                                    Cognition Arousal/Alertness: Awake/alert Behavior During Therapy: WFL for tasks assessed/performed Overall Cognitive Status: Within Functional Limits for tasks assessed                      Exercises      General Comments        Pertinent Vitals/Pain Pain Assessment: No/denies pain    Home Living                      Prior Function            PT Goals (current goals can now be found in the care plan section) Acute Rehab PT Goals 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 PT Goal Formulation: With patient Time For Goal Achievement: 06/29/14 Potential to Achieve Goals: Good Progress towards PT goals: Progressing toward goals    Frequency  Min 3X/week    PT Plan Current plan remains appropriate    Co-evaluation             End of Session Equipment Utilized During Treatment: Gait belt Activity Tolerance: Patient tolerated treatment well Patient left: with call bell/phone within reach;in chair;with family/visitor present     Time: 1346-1400 PT Time Calculation (min) (ACUTE ONLY): 14 min  Charges:  $Gait Training: 8-22 mins                    G Codes:      [name] 06/22/2014, 2:07 PM [phone]

## 2014-06-23 MED ORDER — HYDROCODONE-ACETAMINOPHEN 5-325 MG PO TABS: 1.0000 | ORAL_TABLET | ORAL | Status: DC | PRN

## 2014-06-23 NOTE — Progress Notes (Signed)
Attempted to call report to [facility].  Nurse unavailable. Particia Lather RN to call report to [facility]

## 2014-06-23 NOTE — Clinical Social Work Placement (Signed)
   CLINICAL SOCIAL WORK PLACEMENT  NOTE  Date:  06/23/2014  Patient Details  Name: Harold Jennings MRN: [id number] Date of Birth: May 14, 1932  Clinical Social Work is seeking post-discharge placement for this patient at the [place] level of care (*CSW will initial, date and re-position this form in  chart as items are completed):  Yes   Patient/family provided with [place] Work Department's list of facilities offering this level of care within the geographic area requested by the patient (or if unable, by the patient's family).  Yes   Patient/family informed of their freedom to choose among providers that offer the needed level of care, that participate in Medicare, Medicaid or managed care program needed by the patient, have an available bed and are willing to accept the patient.  Yes   Patient/family informed of [facility]'s ownership interest in [facility] and [facility], as well as of the fact that they are under no obligation to receive care at these facilities.  PASRR submitted to EDS on       PASRR number received on       Existing PASRR number confirmed on 06/19/14     FL2 transmitted to all facilities in geographic area requested by pt/family on 06/19/14     FL2 transmitted to all facilities within larger geographic area on       Patient informed that his/her managed care company has contracts with or will negotiate with certain facilities, including the following:        Yes   Patient/family informed of bed offers received.  Patient chooses bed at [facility]     Physician recommends and patient chooses bed at      Patient to be transferred to [facility] on 06/23/14.  Patient to be transferred to facility by pt wife via private vehicle     Patient family notified on 06/23/14 of transfer.  Name of family member notified:  pt notified at bedside and pt wife, [place] notified via telephone     PHYSICIAN Please sign FL2      Additional Comment:    _______________________________________________ Ladell Pier, LCSW 06/23/2014, 11:07 AM

## 2014-06-23 NOTE — Discharge Summary (Signed)
Physician Discharge Summary [facility]  Patient ID: Harold Jennings MRN: [id number] DOB/AGE: 1932-09-06 81 y.o.  Admit date: 06/12/2014 Discharge date: 06/23/2014  Admission Diagnoses:  Diverticular disease with colovesical fistula  Discharge Diagnoses:  Principal Problem:   Colovesical fistula Active Problems:   Sigmoid diverticulitis   Discharged Condition: good  Hospital Course: patient admitted after sigmoid colectomy and bladder repair for management of diverticular disease with colovesical fistula.  Patient followed by Dr. Diona Fanti post op with Foley cath for one week.  Cystogram showed no leak and foley removed.  Post op ileus resolved with some diarrhea - controlled with loperamide prn.  Prepared for discharge on POD#10.  Consults: cardiology  Treatments: surgery: ex lap, sigmoid colectomy, cystorrhaphy  Discharge Exam: Blood pressure 149/89, pulse 104, temperature 98.7 F (37.1 C), temperature source Oral, resp. rate 18, height 5\' 5"  (1.651 m), weight 93.4 kg (205 lb 14.6 oz), SpO2 98 %. HEENT - clear Neck - soft Chest - clear bilaterally Cor - RRR Abd - wound dry and intact  Disposition: Home  Discharge Instructions    Diet - low sodium heart healthy    Complete by:  As directed      Discharge instructions    Complete by:  As directed   [facility]  OPEN ABDOMINAL SURGERY: POST OP INSTRUCTIONS  Always review your discharge instruction sheet given to you by the facility where your surgery was performed.  A prescription for pain medication may be given to you upon discharge.  Take your pain medication as prescribed.  If narcotic pain medicine is not needed, then you may take acetaminophen (Tylenol) or ibuprofen (Advil) as needed. Take your usually prescribed medications unless otherwise directed. If you need a refill on your pain medication, please contact your pharmacy. They will contact our office to request authorization.   Prescriptions will not be filled after 5 pm or on weekends. You should follow a light diet the first few days after arrival home, such as soup and crackers, unless your doctor has advised otherwise. A high-fiber, low fat diet can be resumed as tolerated.  Be sure to include plenty of fluids daily.  Most patients will experience some swelling and bruising in the area of the incision. Ice packs will help. Swelling and bruising can take several days to resolve. It is common to experience some constipation if taking pain medication after surgery.  Increasing fluid intake and taking a stool softener will usually help or prevent this problem from occurring.  A mild laxative (Milk of Magnesia or Miralax) should be taken according to package directions if there are no bowel movements after 48 hours.  You may have steri-strips (small skin tapes) in place directly over the incision.  These strips should be left on the skin for 5-7 days.  Any sutures or staples will be removed at the office during your follow-up visit. You may find that a light gauze bandage over your incision may keep your staples from being rubbed or pulled. You may shower and replace the bandage daily. ACTIVITIES:  You may resume regular (light) daily activities beginning the next day - such as daily self-care, walking, climbing stairs - gradually increasing activities as tolerated.  You may have sexual intercourse when it is comfortable.  Refrain from any heavy lifting or straining until approved by your doctor.  You may drive when you no longer are taking prescription pain medication, you can comfortably wear a seatbelt, and you can safely  maneuver your car and apply brakes. You should see your doctor in the office for a follow-up appointment approximately 2-3 weeks after your surgery.  Make sure that you call for this appointment within a day or two after you arrive home to insure a convenient appointment time.  WHEN TO CALL YOUR DOCTOR: Fever  greater than 101.0 Inability to urinate Persistent nausea and/or vomiting Extreme swelling or bruising Continued bleeding from incision Increased pain, redness, or drainage from the incision Difficulty swallowing or breathing Muscle cramping or spasms Numbness or tingling in hands or around lips  IF YOU HAVE DISABILITY OR FAMILY LEAVE FORMS, YOU MUST BRING THEM TO THE OFFICE FOR PROCESSING.  PLEASE DO NOT GIVE THEM TO YOUR DOCTOR.  The clinic staff is available to answer your questions during regular business hours.  Please don't hesitate to call and ask to speak to one of the nurses if you have concerns.  [facility] [place] Office: [phone]  For further questions, please visit [email or web address]     Increase activity slowly    Complete by:  As directed      No dressing needed    Complete by:  As directed   Leave Steristrips in place for 5 days.  May shower.            Medication List    TAKE these medications        ciprofloxacin 500 MG tablet  Commonly known as:  CIPRO  Take 1 tablet (500 mg total) by mouth 2 (two) times daily.     diphenhydrAMINE 25 mg capsule  Commonly known as:  BENADRYL  Take 25 mg by mouth at bedtime.     doxazosin 8 MG tablet  Commonly known as:  CARDURA  Take 8 mg by mouth at bedtime.     ezetimibe 10 MG tablet  Commonly known as:  ZETIA  Take 10 mg by mouth every morning.     finasteride 5 MG tablet  Commonly known as:  PROSCAR  Take 5 mg by mouth daily.     furosemide 40 MG tablet  Commonly known as:  LASIX  Take 40 mg by mouth as needed for fluid or edema (fluid).     HYDROcodone-acetaminophen 5-325 MG per tablet  Commonly known as:  NORCO/VICODIN  Take 1-2 tablets by mouth every 4 (four) hours as needed for moderate pain.     ibuprofen 200 MG tablet  Commonly known as:  ADVIL,MOTRIN  Take 400 mg by mouth every 6 (six) hours as needed for headache or moderate pain.     lactose free nutrition Liqd   Take 237 mLs by mouth daily at 12 noon.     levothyroxine 100 MCG tablet  Commonly known as:  SYNTHROID, LEVOTHROID  Take 100 mcg by mouth daily.     loperamide 1 MG/5ML solution  Commonly known as:  IMODIUM  Take 10 mLs (2 mg total) by mouth every 6 (six) hours as needed for diarrhea or loose stools.     metoprolol succinate 50 MG 24 hr tablet  Commonly known as:  TOPROL-XL  Take 50 mg by mouth every morning. Take with or immediately following a meal.     metroNIDAZOLE 500 MG tablet  Commonly known as:  FLAGYL  Take 1 tablet (500 mg total) by mouth 3 (three) times daily.     multivitamin tablet  Take 1 tablet by mouth every morning.     Rivaroxaban 15 MG Tabs tablet  Commonly known as:  XARELTO  Take 1 tablet (15 mg total) by mouth daily with supper.     saccharomyces boulardii 250 MG capsule  Commonly known as:  FLORASTOR  Take 250 mg by mouth every morning.           Follow-up Information    Follow up with [facility].   Why:  Physical therapy    Contact information:   [place] [address] [place], MD, [facility] Office: [phone]   Signed: Earnstine Regal 06/23/2014, 8:20 AM

## 2014-06-23 NOTE — Progress Notes (Signed)
Pt for discharge to [facility].   CSW facilitated pt discharge needs including contacting facility, faxing pt discharge information via [facility], discussing with pt at bedside and pt wife via telephone, providing RN phone number to call report. Pt wife plans to transport pt via private vehicle. CSW provided discharge packet to pt at bedside in order for pt and pt wife to provide to [facility] upon admission to SNF.   Pt and pt wife appreciative of CSW support and assistance. Pt eager to continue to progress in order to return home.   No further social work needs identified at this time.  CSW signing off.   Alison Murray, MSW, [place] Work [phone]

## 2014-06-23 NOTE — Progress Notes (Signed)
Called report to Adonis Huguenin, Therapist, sports at [employer]. Reviewed discharge instructions with daughter and patient. Both state understanding. No questions or needs stated. Daughter to drive patient to [facility].

## 2014-06-24 ENCOUNTER — Non-Acute Institutional Stay (SKILLED_NURSING_FACILITY): Payer: Medicare Other | Admitting: Adult Health

## 2014-06-24 DIAGNOSIS — N400 Enlarged prostate without lower urinary tract symptoms: Secondary | ICD-10-CM | POA: Diagnosis not present

## 2014-06-24 DIAGNOSIS — K5732 Diverticulitis of large intestine without perforation or abscess without bleeding: Secondary | ICD-10-CM

## 2014-06-24 DIAGNOSIS — I10 Essential (primary) hypertension: Secondary | ICD-10-CM

## 2014-06-24 DIAGNOSIS — R5381 Other malaise: Secondary | ICD-10-CM | POA: Diagnosis not present

## 2014-06-24 DIAGNOSIS — R197 Diarrhea, unspecified: Secondary | ICD-10-CM

## 2014-06-24 DIAGNOSIS — I482 Chronic atrial fibrillation, unspecified: Secondary | ICD-10-CM

## 2014-06-24 DIAGNOSIS — E039 Hypothyroidism, unspecified: Secondary | ICD-10-CM | POA: Diagnosis not present

## 2014-06-25 ENCOUNTER — Encounter: Payer: Self-pay | Admitting: Adult Health

## 2014-06-25 ENCOUNTER — Encounter: Payer: Self-pay | Admitting: Pulmonary Disease

## 2014-06-25 NOTE — Progress Notes (Signed)
Patient ID: Harold Jennings, male   DOB: 14-Apr-1932, 81 y.o.   MRN: [id number]   06/24/14  Facility:  Nursing Home Location:  [place] Room Number: 1007-P LEVEL OF CARE:  SNF (31)   Chief Complaint  Patient presents with  . Hospitalization Follow-up    Physical deconditioning, sigmoid diverticulitis S/P sigmoid colectomy and takedown of colovesical fistula, hypertension, BPH, hypothyroidism, diarrhea and atrial fibrillation    HISTORY OF PRESENT ILLNESS:  This is an 81 year old male who has been admitted to [facility] on 06/23/14 from [facility]. He has PMH of atrial fibrillation, chronic combined systolic and diastolic heart failure, hypertension and GERD. He has colonic diverticular abscess with colovesical fistula. He has been treated with daily suppressive dose up on today Rx. Follow-up CT scan from 04/22/2014, shows slight decrease in size of the abscess between the sigmoid colon and dome of the bladder. It now measures 2 cm. He had sigmoid colectomy and takedown of colovesical fistula, incarcerated umbilical hernia repair and cystorrhapy on 5/27.  He has been admitted for a short-term rehabilitation.  PAST MEDICAL HISTORY:  Past Medical History  Diagnosis Date  . Dyslipidemia   . HTN (hypertension)   . Obesity   . Increased liver enzymes     felt to be a fatty liver per ultrasound in 2011  . Dyspnea   . Ischemic cardiomyopathy   . Spinal stenosis   . Sleep apnea     bipap   . CAD (coronary artery disease)     Silent MI sometime prior to 2000. S/P cath in 2000 showing a total mid RCA with collaterals from the LCX. EF is 40 to50%; Last Myoview in 2010 showing inferior scar without ischemia. EF was 52%.  Marland Kitchen Dysrhythmia     atrial fib   . Edema     lower extremity   . Cancer     skin cancer   . Anemia   . Diverticular disease dec 2015  . Umbilical hernia     CURRENT MEDICATIONS: Reviewed per MAR/see medication list  No Known  Allergies   REVIEW OF SYSTEMS:  GENERAL: no change in appetite, no fatigue, no weight changes, no fever, chills or weakness RESPIRATORY: no cough, SOB, DOE, wheezing, hemoptysis CARDIAC: no chest pain, edema or palpitations GI: no abdominal pain, diarrhea, constipation, heart burn, nausea or vomiting  PHYSICAL EXAMINATION  GENERAL: no acute distress SKIN:  Midline abdominal surgical incision has steri-strips, dry, no erythema EYES: conjunctivae normal, sclerae normal, normal eye lids NECK: supple, trachea midline, no neck masses, no thyroid tenderness, no thyromegaly LYMPHATICS: no LAN in the neck, no supraclavicular LAN RESPIRATORY: breathing is even & unlabored, BS CTAB CARDIAC: RRR, no murmur,no extra heart sounds, no edema GI: abdomen soft, normal BS, no masses, no tenderness, no hepatomegaly, no splenomegaly EXTREMITIES: Able to move 4 extremities PSYCHIATRIC: the patient is alert & oriented to person, affect & behavior appropriate  LABS/RADIOLOGY: Labs reviewed: Basic Metabolic Panel:  Recent Labs  06/15/14 0405 06/17/14 0450 06/18/14 0434  NA 138 138 138  K 3.6 3.3* 3.4*  CL 102 107 110  CO2 25 23 21*  GLUCOSE 162* 146* 119*  BUN 14 16 14   CREATININE 0.94 1.07 0.99  CALCIUM 8.8* 8.7* 8.4*   Liver Function Tests:  Recent Labs  01/15/14 0509 04/08/14 2023 04/09/14 0540  AST 30 28 22   ALT 22 22 18   ALKPHOS 136* 134* 123*  BILITOT 1.8* 0.9 1.4*  PROT 6.5 6.9 6.1  ALBUMIN 3.3* 3.5 3.2*    Recent Labs  01/14/14 1801 04/08/14 2023  LIPASE 48 51   CBC:  Recent Labs  01/14/14 1801 01/15/14 0509  04/08/14 2023  06/15/14 0405 06/17/14 0450 06/18/14 0434  WBC 7.2 7.7  < > 7.2  < > 12.4* 12.0* 9.7  NEUTROABS 4.8 5.2  --  4.3  --   --   --   --   HGB 13.9 13.5  < > 11.9*  < > 15.9 14.0 13.3  HCT 40.2 40.5  < > 34.5*  < > 45.1 41.6 39.5  MCV 97.8 98.1  < > 96.6  < > 96.8 97.9 96.8  PLT 168 182  < > 161  < > 194 210 218  < > = values in this  interval not displayed.  Cardiac Enzymes:  Recent Labs  01/14/14 1801  TROPONINI <0.03   CBG:  Recent Labs  01/18/14 2346 01/19/14 0609 01/19/14 1144  GLUCAP 117* 108* 178*    Dg Cystogram  06/18/2014   CLINICAL DATA:  81 year old male with history of complicated diverticulitis with colovesical fistula. Status post surgical repair.  EXAM: CYSTOGRAM  TECHNIQUE: The bladder was filled with 200 mL Cysto-Hypaque 30% by drip infusion via the patient's indwelling Foley catheter. Serial spot images were obtained during bladder filling and post draining.  FLUOROSCOPY TIME:  6 minutes and 13 seconds  COMPARISON:  CT of the abdomen and pelvis 04/22/2014.  FINDINGS: Urinary bladder is highly trabeculated, with multiple small bladder wall diverticulae. At no point during the examination was there a fistulous communication identified between the bladder an adjacent loops of bowel.  IMPRESSION: 1. No evidence of residual colovesical fistula. 2. Highly trabeculated urinary bladder with multiple small bladder wall diverticulae.   Electronically Signed   By: Vinnie Langton M.D.   On: 06/18/2014 12:15   Dg Chest Port 1 View  06/15/2014   CLINICAL DATA:  Shortness of breath and chest tightness. Three days post sigmoid colectomy, colovesical fistula repair and incarcerated umbilical hernia repair.  EXAM: PORTABLE CHEST - 1 VIEW  COMPARISON:  04/08/2014  FINDINGS: Bibasilar atelectasis present. There may be a small amount of left pleural fluid. There is no evidence of pulmonary edema, consolidation, pneumothorax, nodule or pleural fluid. Stable mild cardiomegaly.  IMPRESSION: Bibasilar atelectasis and potential small left pleural effusion.   Electronically Signed   By: Aletta Edouard M.D.   On: 06/15/2014 14:17    ASSESSMENT/PLAN:  Physical deconditioning - for rehabilitation Sigmoid diverticulitis S/P sigmoid colectomy and takedown of colovesical fistula - continue Cipro 500 mg by mouth twice a day 10  days, Flagyl 500 mg 1 tab by mouth 3 times a day 10 days; and Norco 5/325 mg 1-2 tabs by mouth every 4 hours when necessary Hypertension - continue Toprol-XL 50 mg by mouth daily BPH - continue Cardura 8 mg by mouth daily at bedtime and Proscar 5 mg by mouth daily Hypothyroidism - continue Synthroid 100 g by mouth daily Diarrhea - continue loperamide 2 mg 1 tab by mouth every 6 hours when necessary Atrial fibrillation - rate controlled; continue Xarelto 15 mg by mouth daily and Toprol-XL 50 mg by mouth daily   Goals of care:  Short-term rehabilitation   Labs/test ordered:  CMP, tsh and CBC  Spent 50 minutes in patient care.   [facility], NP [employer] [phone]

## 2014-06-26 ENCOUNTER — Non-Acute Institutional Stay (SKILLED_NURSING_FACILITY): Payer: Medicare Other | Admitting: Internal Medicine

## 2014-06-26 ENCOUNTER — Encounter: Payer: Self-pay | Admitting: Internal Medicine

## 2014-06-26 DIAGNOSIS — I10 Essential (primary) hypertension: Secondary | ICD-10-CM | POA: Diagnosis not present

## 2014-06-26 DIAGNOSIS — I482 Chronic atrial fibrillation, unspecified: Secondary | ICD-10-CM

## 2014-06-26 DIAGNOSIS — R5381 Other malaise: Secondary | ICD-10-CM | POA: Diagnosis not present

## 2014-06-26 DIAGNOSIS — E039 Hypothyroidism, unspecified: Secondary | ICD-10-CM

## 2014-06-26 DIAGNOSIS — K5732 Diverticulitis of large intestine without perforation or abscess without bleeding: Secondary | ICD-10-CM | POA: Diagnosis not present

## 2014-06-26 DIAGNOSIS — N400 Enlarged prostate without lower urinary tract symptoms: Secondary | ICD-10-CM | POA: Diagnosis not present

## 2014-06-26 NOTE — Progress Notes (Signed)
Patient ID: Harold Jennings, male   DOB: 1932/08/11, 81 y.o.   MRN: [id number]     [place]  PCP: London Pepper, MD  Code Status: Full Code   No Known Allergies  Chief Complaint  Patient presents with  . New Admit To SNF    New Admission      HPI:  81 year old patient is here for short term rehabilitation post hospital admission from 06/12/14-06/23/14 after sigmoid colectomy and bladder repair for management of diverticular disease with colovesical fistula. He underwent exploratory laprotomy, sigmoid colectomy and takedown of the colovesical fistula, repair of incarcerated umbilical hernia and cystorrhaphy on 06/12/14. He had foley in place for a week and once cystogram showed no leak, foley was removed. Post op ileus resolved. He has PMH of atrial fibrillation, chronic combined systolic and diastolic heart failure, hypertension and GERD. He is seen in his room today. He denies any abdominal pain except for with coughing and movement having discomfort. He does not have an appetite. He had a bowel movement this am   Review of Systems:  Constitutional: Negative for fever, chills,diaphoresis. positive for easy fatigue HENT: Negative for headache, congestion, nasal discharge Eyes: Negative for eye pain, blurred vision, double vision and discharge.  Respiratory: Negative for cough, shortness of breath and wheezing.   Cardiovascular: Negative for chest pain, palpitations, leg swelling.  Gastrointestinal: Negative for heartburn, nausea, vomiting, abdominal pain Genitourinary: Negative for dysuria Musculoskeletal: Negative for back pain, falls Skin: Negative for itching, rash.  Neurological: Negative for dizziness, tingling, focal weakness Psychiatric/Behavioral: Negative for depression   Past Medical History  Diagnosis Date  . Dyslipidemia   . HTN (hypertension)   . Obesity   . Increased liver enzymes     felt to be a fatty liver per ultrasound in 2011  . Dyspnea     . Ischemic cardiomyopathy   . Spinal stenosis   . Sleep apnea     bipap   . CAD (coronary artery disease)     Silent MI sometime prior to 2000. S/P cath in 2000 showing a total mid RCA with collaterals from the LCX. EF is 40 to50%; Last Myoview in 2010 showing inferior scar without ischemia. EF was 52%.  Marland Kitchen Dysrhythmia     atrial fib   . Edema     lower extremity   . Cancer     skin cancer   . Anemia   . Diverticular disease dec 2015  . Umbilical hernia    Past Surgical History  Procedure Laterality Date  . Total hip arthroplasty    . Eye surgery    . Lower back surgery      spinal stenosis  . Shoulder surgery    . Cardioversion N/A 06/06/2012    Procedure: CARDIOVERSION;  Surgeon: Thayer Headings, MD;  Location: [place];  Service: Cardiovascular;  Laterality: N/A;  . Cardiac catheterization    . Partial colectomy N/A 06/12/2014    Procedure: SIGMOID COLECTOMY AND TAKEDOWN OF COLOVESICAL FISTULA ;  Surgeon: Armandina Gemma, MD;  Location: [facility];  Service: General;  Laterality: N/A;  . Umbilical hernia repair N/A 06/12/2014    Procedure: INCARCERATED UMBILICAL HERNIA REPAIR  ;  Surgeon: Armandina Gemma, MD;  Location: [facility];  Service: General;  Laterality: N/A;  . Cystostomy N/A 06/12/2014    Procedure: Jasmine December;  Surgeon: Franchot Gallo, MD;  Location: [facility];  Service: Urology;  Laterality: N/A;   Social History:   reports  that he has never smoked. He has never used smokeless tobacco. He reports that he drinks alcohol. He reports that he does not use illicit drugs.  Family History  Problem Relation Age of Onset  . Stroke Father   . Heart attack Father   . Hypertension Father   . Diabetes Father   . Drug abuse Mother   . Hypertension Brother   . Hypertension Brother   . Hypertension Brother   . Hypertension Sister     Medications: Patient's Medications  New Prescriptions   No medications on file  Previous Medications   AMBULATORY NON FORMULARY MEDICATION     Medication Name: Med pass 120 mL by mouth daily at 12   CIPROFLOXACIN (CIPRO) 500 MG TABLET    Take 1 tablet (500 mg total) by mouth 2 (two) times daily.   DIPHENHYDRAMINE (BENADRYL) 25 MG CAPSULE    Take 25 mg by mouth at bedtime.    DOXAZOSIN (CARDURA) 8 MG TABLET    Take 8 mg by mouth at bedtime.   EZETIMIBE (ZETIA) 10 MG TABLET    Take 10 mg by mouth every morning.    FINASTERIDE (PROSCAR) 5 MG TABLET    Take 5 mg by mouth daily.   FUROSEMIDE (LASIX) 40 MG TABLET    Take 40 mg by mouth as needed for fluid or edema (fluid).    HYDROCODONE-ACETAMINOPHEN (NORCO/VICODIN) 5-325 MG PER TABLET    Take 1-2 tablets by mouth every 4 (four) hours as needed for moderate pain.   IBUPROFEN (ADVIL,MOTRIN) 200 MG TABLET    Take 400 mg by mouth every 6 (six) hours as needed for headache or moderate pain.   LEVOTHYROXINE (SYNTHROID, LEVOTHROID) 100 MCG TABLET    Take 100 mcg by mouth daily.   LOPERAMIDE (IMODIUM) 1 MG/5ML SOLUTION    Take 10 mLs (2 mg total) by mouth every 6 (six) hours as needed for diarrhea or loose stools.   METOPROLOL SUCCINATE (TOPROL-XL) 50 MG 24 HR TABLET    Take 50 mg by mouth every morning. Take with or immediately following a meal.   METRONIDAZOLE (FLAGYL) 500 MG TABLET    Take 1 tablet (500 mg total) by mouth 3 (three) times daily.   MULTIPLE VITAMIN (MULTIVITAMIN) TABLET    Take 1 tablet by mouth every morning.    RIVAROXABAN (XARELTO) 15 MG TABS TABLET    Take 1 tablet (15 mg total) by mouth daily with supper.   SACCHAROMYCES BOULARDII (FLORASTOR) 250 MG CAPSULE    Take 250 mg by mouth every morning.   Modified Medications   No medications on file  Discontinued Medications   LACTOSE FREE NUTRITION (BOOST) LIQD    Take 237 mLs by mouth daily at 12 noon.     Physical Exam: Filed Vitals:   06/26/14 0959  BP: 132/91  Pulse: 94  Temp: 97 F (36.1 C)  TempSrc: Oral  Resp: 18  Height: 5\' 5"  (1.651 m)  Weight: 193 lb 6.4 oz (87.726 kg)  SpO2: 96%    General- elderly male,  in no acute distress Head- normocephalic, atraumatic Throat- moist mucus membrane Eyes- PERRLA, EOMI, no pallor, no icterus, no discharge, normal conjunctiva, normal sclera Neck- no cervical lymphadenopathy Cardiovascular- irregular heart rate, no murmurs, palpable dorsalis pedis and radial pulses, no leg edema Respiratory- bilateral clear to auscultation, no wheeze, no rhonchi, no crackles, no use of accessory muscles Abdomen- bowel sounds present, soft, non tender Musculoskeletal- able to move all 4 extremities, generalized weakness Neurological-  no focal deficit Skin- warm and dry, abdominal incision with steri strip and mild erythema present, non tender Psychiatry- alert and oriented to person, place and time, normal mood and affect    Labs reviewed: Basic Metabolic Panel:  Recent Labs  06/15/14 0405 06/17/14 0450 06/18/14 0434  NA 138 138 138  K 3.6 3.3* 3.4*  CL 102 107 110  CO2 25 23 21*  GLUCOSE 162* 146* 119*  BUN 14 16 14   CREATININE 0.94 1.07 0.99  CALCIUM 8.8* 8.7* 8.4*   Liver Function Tests:  Recent Labs  01/15/14 0509 04/08/14 2023 04/09/14 0540  AST 30 28 22   ALT 22 22 18   ALKPHOS 136* 134* 123*  BILITOT 1.8* 0.9 1.4*  PROT 6.5 6.9 6.1  ALBUMIN 3.3* 3.5 3.2*    Recent Labs  01/14/14 1801 04/08/14 2023  LIPASE 48 51   No results for input(s): AMMONIA in the last 8760 hours. CBC:  Recent Labs  01/14/14 1801 01/15/14 0509  04/08/14 2023  06/15/14 0405 06/17/14 0450 06/18/14 0434  WBC 7.2 7.7  < > 7.2  < > 12.4* 12.0* 9.7  NEUTROABS 4.8 5.2  --  4.3  --   --   --   --   HGB 13.9 13.5  < > 11.9*  < > 15.9 14.0 13.3  HCT 40.2 40.5  < > 34.5*  < > 45.1 41.6 39.5  MCV 97.8 98.1  < > 96.6  < > 96.8 97.9 96.8  PLT 168 182  < > 161  < > 194 210 218  < > = values in this interval not displayed. Cardiac Enzymes:  Recent Labs  01/14/14 1801  TROPONINI <0.03   BNP: Invalid input(s): POCBNP CBG:  Recent Labs  01/18/14 2346  01/19/14 0609 01/19/14 1144  GLUCAP 117* 108* 178*    Assessment/Plan  Physical deconditioning Will have him work with physical therapy and occupational therapy team to help with gait training and muscle strengthening exercises.fall precautions. Skin care. Encourage to be out of bed.   Sigmoid diverticulitis  S/P sigmoid colectomy and takedown of colovesical fistula. Continue Cipro 500 mg bid and Flagyl 500 mg tid for 10 days. Continue Norco 5/325 mg 1-2 tabs q4h prn pain  Hypertension Stable. continue Toprol-XL 50 mg daily  Atrial fibrillation  continue toprol xl 50 mg daily and Xarelto 15 mg daily. Rate controlled  BPH continue Cardura 8 mg daily and Proscar 5 mg daily  Hypothyroidism continue Synthroid 100 mcg daily   Goals of care:  Short-term rehabilitation   Labs/test ordered:  CMP, tsh and CBC    Goals of care: short term rehabilitation    Labs/tests ordered: bmp  Family/ staff Communication: reviewed care plan with patient and nursing supervisor    Blanchie Serve, MD  [facility]: [facility] [phone] (Monday-Friday 8 am - 5 pm) [phone] (afterhours)

## 2014-06-30 ENCOUNTER — Encounter: Payer: Self-pay | Admitting: Adult Health

## 2014-06-30 ENCOUNTER — Non-Acute Institutional Stay (SKILLED_NURSING_FACILITY): Payer: Medicare Other | Admitting: Adult Health

## 2014-06-30 DIAGNOSIS — I482 Chronic atrial fibrillation, unspecified: Secondary | ICD-10-CM

## 2014-06-30 DIAGNOSIS — R197 Diarrhea, unspecified: Secondary | ICD-10-CM | POA: Diagnosis not present

## 2014-06-30 DIAGNOSIS — N400 Enlarged prostate without lower urinary tract symptoms: Secondary | ICD-10-CM | POA: Diagnosis not present

## 2014-06-30 DIAGNOSIS — K5732 Diverticulitis of large intestine without perforation or abscess without bleeding: Secondary | ICD-10-CM

## 2014-06-30 DIAGNOSIS — E876 Hypokalemia: Secondary | ICD-10-CM | POA: Diagnosis not present

## 2014-06-30 DIAGNOSIS — I10 Essential (primary) hypertension: Secondary | ICD-10-CM | POA: Diagnosis not present

## 2014-06-30 DIAGNOSIS — E039 Hypothyroidism, unspecified: Secondary | ICD-10-CM | POA: Diagnosis not present

## 2014-06-30 DIAGNOSIS — R5381 Other malaise: Secondary | ICD-10-CM

## 2014-06-30 NOTE — Progress Notes (Signed)
Patient ID: Harold Jennings, male   DOB: 12/06/1932, 81 y.o.   MRN: [id number]   06/30/14  Facility:  Nursing Home Location:  [place] Room Number: 1007-P LEVEL OF CARE:  SNF (31)   Chief Complaint  Patient presents with  . Discharge Note    Physical deconditioning, sigmoid diverticulitis S/P sigmoid colectomy and takedown of colovesical fistula, hypertension, BPH, hypothyroidism, diarrhea and atrial fibrillation    HISTORY OF PRESENT ILLNESS:  This is an 81 year old male who is for discharge home with home health PT for gait therapeutic exercises and endurance and home health nursing for disease management. He has been admitted to [facility] on 06/23/14 from [facility]. He has PMH of atrial fibrillation, chronic combined systolic and diastolic heart failure, hypertension and GERD. He has colonic diverticular abscess with colovesical fistula. He has been treated with daily suppressive dose up on today Rx. Follow-up CT scan from 04/22/2014, shows slight decrease in size of the abscess between the sigmoid colon and dome of the bladder. It now measures 2 cm. He had sigmoid colectomy and takedown of colovesical fistula, incarcerated umbilical hernia repair and cystorrhapy on 5/27.  Patient was admitted to this facility for short-term rehabilitation after the patient's recent hospitalization.  Patient has completed SNF rehabilitation and therapy has cleared the patient for discharge.  PAST MEDICAL HISTORY:  Past Medical History  Diagnosis Date  . Dyslipidemia   . HTN (hypertension)   . Obesity   . Increased liver enzymes     felt to be a fatty liver per ultrasound in 2011  . Dyspnea   . Ischemic cardiomyopathy   . Spinal stenosis   . Sleep apnea     bipap   . CAD (coronary artery disease)     Silent MI sometime prior to 2000. S/P cath in 2000 showing a total mid RCA with collaterals from the LCX. EF is 40 to50%; Last Myoview in 2010 showing  inferior scar without ischemia. EF was 52%.  Marland Kitchen Dysrhythmia     atrial fib   . Edema     lower extremity   . Cancer     skin cancer   . Anemia   . Diverticular disease dec 2015  . Umbilical hernia     CURRENT MEDICATIONS: Reviewed per MAR/see medication list  No Known Allergies   REVIEW OF SYSTEMS:  GENERAL: no change in appetite, no fatigue, no weight changes, no fever, chills or weakness RESPIRATORY: no cough, SOB, DOE, wheezing, hemoptysis CARDIAC: no chest pain, edema or palpitations GI: no abdominal pain, diarrhea, constipation, heart burn, nausea or vomiting  PHYSICAL EXAMINATION  GENERAL: no acute distress SKIN:  Midline abdominal surgical incision has steri-strips, dry, no erythema NECK: supple, trachea midline, no neck masses, no thyroid tenderness, no thyromegaly LYMPHATICS: no LAN in the neck, no supraclavicular LAN RESPIRATORY: breathing is even & unlabored, BS CTAB CARDIAC: RRR, no murmur,no extra heart sounds, no edema GI: abdomen soft, normal BS, no masses, no tenderness, no hepatomegaly, no splenomegaly EXTREMITIES: Able to move 4 extremities PSYCHIATRIC: the patient is alert & oriented to person, affect & behavior appropriate  LABS/RADIOLOGY: Labs reviewed: 06/27/14  WBC 7.8 hemoglobin 12.2 hematocrit 35.3 MCV 92.4 sodium 139 potassium 2.9 glucose 104 BUN 9 creatinine 1.06 total bilirubin 1.8 alkaline phosphatase 126 SGOT 17 SGPT 16 total protein 5.3 albumin 2.9 calcium 7.6 TSH 0.814 Basic Metabolic Panel:  Recent Labs  06/15/14 0405 06/17/14 0450 06/18/14 0434  NA 138  138 138  K 3.6 3.3* 3.4*  CL 102 107 110  CO2 25 23 21*  GLUCOSE 162* 146* 119*  BUN 14 16 14   CREATININE 0.94 1.07 0.99  CALCIUM 8.8* 8.7* 8.4*   Liver Function Tests:  Recent Labs  01/15/14 0509 04/08/14 2023 04/09/14 0540  AST 30 28 22   ALT 22 22 18   ALKPHOS 136* 134* 123*  BILITOT 1.8* 0.9 1.4*  PROT 6.5 6.9 6.1  ALBUMIN 3.3* 3.5 3.2*    Recent Labs   01/14/14 1801 04/08/14 2023  LIPASE 48 51   CBC:  Recent Labs  01/14/14 1801 01/15/14 0509  04/08/14 2023  06/15/14 0405 06/17/14 0450 06/18/14 0434  WBC 7.2 7.7  < > 7.2  < > 12.4* 12.0* 9.7  NEUTROABS 4.8 5.2  --  4.3  --   --   --   --   HGB 13.9 13.5  < > 11.9*  < > 15.9 14.0 13.3  HCT 40.2 40.5  < > 34.5*  < > 45.1 41.6 39.5  MCV 97.8 98.1  < > 96.6  < > 96.8 97.9 96.8  PLT 168 182  < > 161  < > 194 210 218  < > = values in this interval not displayed.  Cardiac Enzymes:  Recent Labs  01/14/14 1801  TROPONINI <0.03   CBG:  Recent Labs  01/18/14 2346 01/19/14 0609 01/19/14 1144  GLUCAP 117* 108* 178*    Dg Cystogram  06/18/2014   CLINICAL DATA:  81 year old male with history of complicated diverticulitis with colovesical fistula. Status post surgical repair.  EXAM: CYSTOGRAM  TECHNIQUE: The bladder was filled with 200 mL Cysto-Hypaque 30% by drip infusion via the patient's indwelling Foley catheter. Serial spot images were obtained during bladder filling and post draining.  FLUOROSCOPY TIME:  6 minutes and 13 seconds  COMPARISON:  CT of the abdomen and pelvis 04/22/2014.  FINDINGS: Urinary bladder is highly trabeculated, with multiple small bladder wall diverticulae. At no point during the examination was there a fistulous communication identified between the bladder an adjacent loops of bowel.  IMPRESSION: 1. No evidence of residual colovesical fistula. 2. Highly trabeculated urinary bladder with multiple small bladder wall diverticulae.   Electronically Signed   By: Vinnie Langton M.D.   On: 06/18/2014 12:15   Dg Chest Port 1 View  06/15/2014   CLINICAL DATA:  Shortness of breath and chest tightness. Three days post sigmoid colectomy, colovesical fistula repair and incarcerated umbilical hernia repair.  EXAM: PORTABLE CHEST - 1 VIEW  COMPARISON:  04/08/2014  FINDINGS: Bibasilar atelectasis present. There may be a small amount of left pleural fluid. There is no  evidence of pulmonary edema, consolidation, pneumothorax, nodule or pleural fluid. Stable mild cardiomegaly.  IMPRESSION: Bibasilar atelectasis and potential small left pleural effusion.   Electronically Signed   By: Aletta Edouard M.D.   On: 06/15/2014 14:17    ASSESSMENT/PLAN:  Physical deconditioning - for home health PT and nursing  Sigmoid diverticulitis S/P sigmoid colectomy and takedown of colovesical fistula - continue Cipro 500 mg by mouth twice a day 5 days, Flagyl 500 mg 1 tab by mouth 3 times a day 5 days; and Norco 5/325 mg 1-2 tabs by mouth every 4 hours when necessary Hypertension - continue Toprol-XL 50 mg by mouth daily BPH - continue Cardura 8 mg by mouth daily at bedtime and Proscar 5 mg by mouth daily Hypothyroidism -  tsh 3.788; continue Synthroid 100 g by mouth daily  Diarrhea - continue loperamide 2 mg 1 tab by mouth every 6 hours when necessary Atrial fibrillation - rate controlled; continue Xarelto 15 mg by mouth daily and Toprol-XL 50 mg by mouth daily Hypokalemia - K2.9; start KCl 20 meq  by mouth twice a day 2 days   I have filled out patient's discharge paperwork and written prescriptions.  Patient will receive home health PT and Nursing.  Total discharge time: Greater than 30 minutes  Discharge time involved coordination of the discharge process with social worker, nursing staff and therapy department. Medical justification for home health services verified.   [facility], NP [employer] [phone]

## 2014-07-06 ENCOUNTER — Ambulatory Visit: Payer: Medicare Other | Admitting: Cardiovascular Disease

## 2014-07-12 ENCOUNTER — Encounter (HOSPITAL_COMMUNITY): Payer: Self-pay

## 2014-07-12 ENCOUNTER — Inpatient Hospital Stay (HOSPITAL_COMMUNITY)
Admission: EM | Admit: 2014-07-12 | Discharge: 2014-07-14 | DRG: 682 | Disposition: A | Discharge status: Home Health | Payer: Medicare Other | Attending: Internal Medicine | Admitting: Internal Medicine

## 2014-07-12 DIAGNOSIS — I4891 Unspecified atrial fibrillation: Secondary | ICD-10-CM | POA: Diagnosis present

## 2014-07-12 DIAGNOSIS — E785 Hyperlipidemia, unspecified: Secondary | ICD-10-CM | POA: Diagnosis present

## 2014-07-12 DIAGNOSIS — Z833 Family history of diabetes mellitus: Secondary | ICD-10-CM

## 2014-07-12 DIAGNOSIS — I5042 Chronic combined systolic (congestive) and diastolic (congestive) heart failure: Secondary | ICD-10-CM | POA: Diagnosis present

## 2014-07-12 DIAGNOSIS — N321 Vesicointestinal fistula: Secondary | ICD-10-CM | POA: Diagnosis present

## 2014-07-12 DIAGNOSIS — R5383 Other fatigue: Secondary | ICD-10-CM

## 2014-07-12 DIAGNOSIS — Z96649 Presence of unspecified artificial hip joint: Secondary | ICD-10-CM | POA: Diagnosis present

## 2014-07-12 DIAGNOSIS — E669 Obesity, unspecified: Secondary | ICD-10-CM | POA: Diagnosis present

## 2014-07-12 DIAGNOSIS — Z79899 Other long term (current) drug therapy: Secondary | ICD-10-CM

## 2014-07-12 DIAGNOSIS — I2510 Atherosclerotic heart disease of native coronary artery without angina pectoris: Secondary | ICD-10-CM | POA: Diagnosis present

## 2014-07-12 DIAGNOSIS — W19XXXA Unspecified fall, initial encounter: Secondary | ICD-10-CM | POA: Diagnosis not present

## 2014-07-12 DIAGNOSIS — G4730 Sleep apnea, unspecified: Secondary | ICD-10-CM | POA: Diagnosis present

## 2014-07-12 DIAGNOSIS — Z9049 Acquired absence of other specified parts of digestive tract: Secondary | ICD-10-CM | POA: Diagnosis present

## 2014-07-12 DIAGNOSIS — N400 Enlarged prostate without lower urinary tract symptoms: Secondary | ICD-10-CM | POA: Diagnosis present

## 2014-07-12 DIAGNOSIS — N179 Acute kidney failure, unspecified: Principal | ICD-10-CM | POA: Diagnosis present

## 2014-07-12 DIAGNOSIS — I482 Chronic atrial fibrillation, unspecified: Secondary | ICD-10-CM | POA: Diagnosis present

## 2014-07-12 DIAGNOSIS — I10 Essential (primary) hypertension: Secondary | ICD-10-CM | POA: Diagnosis present

## 2014-07-12 DIAGNOSIS — E039 Hypothyroidism, unspecified: Secondary | ICD-10-CM | POA: Diagnosis present

## 2014-07-12 DIAGNOSIS — I252 Old myocardial infarction: Secondary | ICD-10-CM | POA: Diagnosis not present

## 2014-07-12 DIAGNOSIS — W19XXXS Unspecified fall, sequela: Secondary | ICD-10-CM | POA: Diagnosis not present

## 2014-07-12 DIAGNOSIS — Z85828 Personal history of other malignant neoplasm of skin: Secondary | ICD-10-CM | POA: Diagnosis not present

## 2014-07-12 DIAGNOSIS — Z6831 Body mass index (BMI) 31.0-31.9, adult: Secondary | ICD-10-CM

## 2014-07-12 DIAGNOSIS — Z8249 Family history of ischemic heart disease and other diseases of the circulatory system: Secondary | ICD-10-CM

## 2014-07-12 DIAGNOSIS — E43 Unspecified severe protein-calorie malnutrition: Secondary | ICD-10-CM | POA: Diagnosis present

## 2014-07-12 HISTORY — DX: Heart failure, unspecified: I50.9

## 2014-07-12 HISTORY — DX: Acute myocardial infarction, unspecified: I21.9

## 2014-07-12 LAB — CBC WITH DIFFERENTIAL/PLATELET
BASOS ABS: 0 10*3/uL (ref 0.0–0.1)
Basophils Absolute: 0 10*3/uL (ref 0.0–0.1)
Basophils Relative: 0 % (ref 0–1)
Basophils Relative: 0 % (ref 0–1)
EOS ABS: 0.1 10*3/uL (ref 0.0–0.7)
EOS PCT: 2 % (ref 0–5)
Eosinophils Absolute: 0.2 10*3/uL (ref 0.0–0.7)
Eosinophils Relative: 2 % (ref 0–5)
HEMATOCRIT: 35.6 % — AB (ref 39.0–52.0)
HEMATOCRIT: 36.7 % — AB (ref 39.0–52.0)
Hemoglobin: 12.2 g/dL — ABNORMAL LOW (ref 13.0–17.0)
Hemoglobin: 12.6 g/dL — ABNORMAL LOW (ref 13.0–17.0)
LYMPHS ABS: 1.6 10*3/uL (ref 0.7–4.0)
Lymphocytes Relative: 27 % (ref 12–46)
Lymphocytes Relative: 22 % (ref 12–46)
Lymphs Abs: 2 10*3/uL (ref 0.7–4.0)
MCH: 33.1 pg (ref 26.0–34.0)
MCH: 33.1 pg (ref 26.0–34.0)
MCHC: 34.3 g/dL (ref 30.0–36.0)
MCHC: 34.3 g/dL (ref 30.0–36.0)
MCV: 96.5 fL (ref 78.0–100.0)
MCV: 96.3 fL (ref 78.0–100.0)
MONO ABS: 0.9 10*3/uL (ref 0.1–1.0)
MONO ABS: 1.1 10*3/uL — AB (ref 0.1–1.0)
MONOS PCT: 12 % (ref 3–12)
MONOS PCT: 14 % — AB (ref 3–12)
NEUTROS ABS: 4.7 10*3/uL (ref 1.7–7.7)
NEUTROS PCT: 62 % (ref 43–77)
Neutro Abs: 4.3 10*3/uL (ref 1.7–7.7)
Neutrophils Relative %: 59 % (ref 43–77)
Platelets: 330 10*3/uL (ref 150–400)
Platelets: 324 10*3/uL (ref 150–400)
RBC: 3.69 MIL/uL — ABNORMAL LOW (ref 4.22–5.81)
RBC: 3.81 MIL/uL — AB (ref 4.22–5.81)
RDW: 15.7 % — ABNORMAL HIGH (ref 11.5–15.5)
RDW: 15.6 % — ABNORMAL HIGH (ref 11.5–15.5)
WBC: 7.3 10*3/uL (ref 4.0–10.5)
WBC: 7.6 10*3/uL (ref 4.0–10.5)

## 2014-07-12 LAB — COMPREHENSIVE METABOLIC PANEL
ALBUMIN: 2.8 g/dL — AB (ref 3.5–5.0)
ALBUMIN: 2.9 g/dL — AB (ref 3.5–5.0)
ALK PHOS: 153 U/L — AB (ref 38–126)
ALT: 19 U/L (ref 17–63)
ALT: 20 U/L (ref 17–63)
AST: 35 U/L (ref 15–41)
AST: 32 U/L (ref 15–41)
Alkaline Phosphatase: 147 U/L — ABNORMAL HIGH (ref 38–126)
Anion gap: 10 (ref 5–15)
Anion gap: 9 (ref 5–15)
BILIRUBIN TOTAL: 0.8 mg/dL (ref 0.3–1.2)
BUN: 48 mg/dL — AB (ref 6–20)
BUN: 49 mg/dL — ABNORMAL HIGH (ref 6–20)
CHLORIDE: 97 mmol/L — AB (ref 101–111)
CO2: 24 mmol/L (ref 22–32)
CO2: 26 mmol/L (ref 22–32)
CREATININE: 2.67 mg/dL — AB (ref 0.61–1.24)
Calcium: 8.5 mg/dL — ABNORMAL LOW (ref 8.9–10.3)
Calcium: 8.6 mg/dL — ABNORMAL LOW (ref 8.9–10.3)
Chloride: 98 mmol/L — ABNORMAL LOW (ref 101–111)
Creatinine, Ser: 2.57 mg/dL — ABNORMAL HIGH (ref 0.61–1.24)
GFR calc Af Amer: 26 mL/min — ABNORMAL LOW (ref >60)
GFR calc non Af Amer: 22 mL/min — ABNORMAL LOW (ref >60)
GFR calc non Af Amer: 21 mL/min — ABNORMAL LOW (ref >60)
GFR, EST AFRICAN AMERICAN: 24 mL/min — AB (ref >60)
GLUCOSE: 124 mg/dL — AB (ref 65–99)
GLUCOSE: 99 mg/dL (ref 65–99)
Potassium: 4.3 mmol/L (ref 3.5–5.1)
Potassium: 4.4 mmol/L (ref 3.5–5.1)
SODIUM: 132 mmol/L — AB (ref 135–145)
SODIUM: 132 mmol/L — AB (ref 135–145)
TOTAL PROTEIN: 7.5 g/dL (ref 6.5–8.1)
Total Bilirubin: 0.8 mg/dL (ref 0.3–1.2)
Total Protein: 7.3 g/dL (ref 6.5–8.1)

## 2014-07-12 LAB — TSH: TSH: 3.2 u[IU]/mL (ref 0.350–4.500)

## 2014-07-12 LAB — URINALYSIS, ROUTINE W REFLEX MICROSCOPIC
BILIRUBIN URINE: NEGATIVE
GLUCOSE, UA: NEGATIVE mg/dL
Ketones, ur: NEGATIVE mg/dL
Nitrite: NEGATIVE
PH: 6.5 (ref 5.0–8.0)
Protein, ur: 100 mg/dL — AB
Specific Gravity, Urine: 1.012 (ref 1.005–1.030)
Urobilinogen, UA: 0.2 mg/dL (ref 0.0–1.0)

## 2014-07-12 LAB — PROTIME-INR
INR: 1.38 (ref 0.00–1.49)
Prothrombin Time: 17 seconds — ABNORMAL HIGH (ref 11.6–15.2)

## 2014-07-12 LAB — URINE MICROSCOPIC-ADD ON

## 2014-07-12 LAB — MRSA PCR SCREENING: MRSA by PCR: NEGATIVE

## 2014-07-12 LAB — LIPASE, BLOOD: Lipase: 64 U/L — ABNORMAL HIGH (ref 22–51)

## 2014-07-12 LAB — MAGNESIUM: Magnesium: 2.1 mg/dL (ref 1.7–2.4)

## 2014-07-12 LAB — APTT: aPTT: 40 seconds — ABNORMAL HIGH (ref 24–37)

## 2014-07-12 LAB — PHOSPHORUS: PHOSPHORUS: 4.5 mg/dL (ref 2.5–4.6)

## 2014-07-12 MED ORDER — METOPROLOL SUCCINATE ER 50 MG PO TB24
50.0000 mg | ORAL_TABLET | Freq: Every morning | ORAL | Status: DC
  Administered 2014-07-13 – 2014-07-14 (×2): 50 mg via ORAL
  Filled 2014-07-12 (×2): qty 1

## 2014-07-12 MED ORDER — ACETAMINOPHEN 325 MG PO TABS: 650.0000 mg | ORAL_TABLET | Freq: Four times a day (QID) | ORAL | Status: DC | PRN

## 2014-07-12 MED ORDER — FINASTERIDE 5 MG PO TABS
5.0000 mg | ORAL_TABLET | Freq: Every day | ORAL | Status: DC
  Administered 2014-07-12 – 2014-07-14 (×3): 5 mg via ORAL
  Filled 2014-07-12 (×3): qty 1

## 2014-07-12 MED ORDER — ACETAMINOPHEN 650 MG RE SUPP: 650.0000 mg | Freq: Four times a day (QID) | RECTAL | Status: DC | PRN

## 2014-07-12 MED ORDER — DOXAZOSIN MESYLATE 8 MG PO TABS
8.0000 mg | ORAL_TABLET | Freq: Every day | ORAL | Status: DC
  Administered 2014-07-12 – 2014-07-13 (×2): 8 mg via ORAL
  Filled 2014-07-12 (×3): qty 1

## 2014-07-12 MED ORDER — EZETIMIBE 10 MG PO TABS
10.0000 mg | ORAL_TABLET | Freq: Every morning | ORAL | Status: DC
  Administered 2014-07-13 – 2014-07-14 (×2): 10 mg via ORAL
  Filled 2014-07-12 (×2): qty 1

## 2014-07-12 MED ORDER — ONDANSETRON HCL 4 MG PO TABS: 4.0000 mg | ORAL_TABLET | Freq: Four times a day (QID) | ORAL | Status: DC | PRN

## 2014-07-12 MED ORDER — HYDROCODONE-ACETAMINOPHEN 5-325 MG PO TABS: 1.0000 | ORAL_TABLET | ORAL | Status: DC | PRN

## 2014-07-12 MED ORDER — ADULT MULTIVITAMIN W/MINERALS CH
1.0000 | ORAL_TABLET | Freq: Every morning | ORAL | Status: DC
  Administered 2014-07-13 – 2014-07-14 (×2): 1 via ORAL
  Filled 2014-07-12 (×2): qty 1

## 2014-07-12 MED ORDER — SODIUM CHLORIDE 0.9 % IV SOLN
INTRAVENOUS | Status: AC
  Administered 2014-07-12: 17:00:00 via INTRAVENOUS

## 2014-07-12 MED ORDER — SODIUM CHLORIDE 0.9 % IV SOLN: Freq: Once | INTRAVENOUS | Status: DC

## 2014-07-12 MED ORDER — LEVOTHYROXINE SODIUM 100 MCG PO TABS
100.0000 ug | ORAL_TABLET | Freq: Every day | ORAL | Status: DC
  Administered 2014-07-13 – 2014-07-14 (×2): 100 ug via ORAL
  Filled 2014-07-12 (×3): qty 1

## 2014-07-12 MED ORDER — SODIUM CHLORIDE 0.9 % IV SOLN: INTRAVENOUS | Status: AC

## 2014-07-12 MED ORDER — DIPHENHYDRAMINE HCL 25 MG PO CAPS
25.0000 mg | ORAL_CAPSULE | Freq: Once | ORAL | Status: AC
  Administered 2014-07-12: 25 mg via ORAL
  Filled 2014-07-12: qty 1

## 2014-07-12 MED ORDER — PNEUMOCOCCAL VAC POLYVALENT 25 MCG/0.5ML IJ INJ
0.5000 mL | INJECTION | INTRAMUSCULAR | Status: AC
  Administered 2014-07-13: 0.5 mL via INTRAMUSCULAR
  Filled 2014-07-12 (×2): qty 0.5

## 2014-07-12 MED ORDER — ONDANSETRON HCL 4 MG/2ML IJ SOLN: 4.0000 mg | Freq: Four times a day (QID) | INTRAMUSCULAR | Status: DC | PRN

## 2014-07-12 MED ORDER — RIVAROXABAN 15 MG PO TABS
15.0000 mg | ORAL_TABLET | Freq: Every day | ORAL | Status: DC
  Administered 2014-07-12 – 2014-07-13 (×2): 15 mg via ORAL
  Filled 2014-07-12 (×3): qty 1

## 2014-07-12 MED ORDER — SODIUM CHLORIDE 0.9 % IV SOLN: INTRAVENOUS | Status: DC

## 2014-07-12 MED ORDER — ENSURE ENLIVE PO LIQD
237.0000 mL | Freq: Two times a day (BID) | ORAL | Status: DC
  Administered 2014-07-13 – 2014-07-14 (×3): 237 mL via ORAL

## 2014-07-12 MED ORDER — SACCHAROMYCES BOULARDII 250 MG PO CAPS
250.0000 mg | ORAL_CAPSULE | Freq: Every morning | ORAL | Status: DC
  Administered 2014-07-13 – 2014-07-14 (×2): 250 mg via ORAL
  Filled 2014-07-12 (×2): qty 1

## 2014-07-12 NOTE — ED Notes (Signed)
I have just given Beverlee Nims, RN report on [facility] [address] and will transport shortly.

## 2014-07-12 NOTE — H&P (Addendum)
[facility] History and Physical  IBAN UTZ AOZ:[id number] DOB: Jun 16, 1932 DOA: 07/12/2014  Referring physician: ER physician: Dr. Pamella Pert PCP: London Pepper, MD  Chief Complaint:   HPI:  81 year old male with past medical history of atrial fibrillation on xarelto, dyslipidemia, CAD, chronic systolic and diastolic CHF (last 2 D ECHO in 08/2013 with EF of 45%), recent rehabilitation after hospitalization from 06/12/2014 to 06/23/2014 for sigmoid colectomy and bladder repair for management of diverticular disease with colovesical fistula. He presented to [facility] ED because recently he had blood work done by PCP and was told he has worsening kidney function. He repots no significant complaints at this time but does endorse few falls about 1 week prior to this admission when he slid but did not hit his head. No lightheadedness or loss of consciousness. No chest pain, shortness of breath or palpitations. No abdominal pain, nausea or vomiting. No fevers or chills. No blood in stool or urine. No diarrhea or constipation.   In ED< BP was 135/74 and one measurement of 125/101 but this was transient and has subsequently normalized. Blood work showed creatinine of 2.57 and he actually has had normal creatinine based on previous lab review. He was started on low rate IV fluids and admitted for further evaluation of renal failure.   Assessment & Plan    Principal Problem:   Fall - Unclear etiology - CT head not done on admission on grounds that pt never hit the head when he fell - Will get PT evaluation  Active Problems:   ARF (acute renal failure) - Probably from lasix which was put on hold - Given low rate IVF for about 10 hours - Recheck BMP tomorrow am    Dyslipidemia - Continue ezetimibe    Essential hypertension - Continue metoprolol    Hypothyroidism - Continue synthroid    Chronic combined diastolic and systolic CHF - Compensated - Last 2 D ECHO in 08/2013 with EF of  45% - Lasix on hold due to acute renal failure    Chronic atrial fibrillation - CHADS vasc score at least 3 - rate controlled with metoprolol - On full dose AC with xarelto    Colovesical fistula - Recent surgery - Stable    BPH (benign prostatic hyperplasia) - Continue cardura and proscar    DVT prophylaxis:  - On full dose anticoagulation with xarelto   Radiological Exams on Admission: No results found.  EKG: I have personally reviewed EKG. EKG shows atrial fibrillation   Code Status: Full Family Communication: Plan of care discussed with the patient  Disposition Plan: Admit for further evaluation  Leisa Lenz, MD  [facility] Pager [phone]  Time spent in minutes: 75 minutes  Review of Systems:  Constitutional: Negative for fever, chills and malaise/fatigue. Negative for diaphoresis.  HENT: Negative for hearing loss, ear pain, nosebleeds, congestion, sore throat, neck pain, tinnitus and ear discharge.   Eyes: Negative for blurred vision, double vision, photophobia, pain, discharge and redness.  Respiratory: Negative for cough, hemoptysis, sputum production, shortness of breath, wheezing and stridor.   Cardiovascular: Negative for chest pain, palpitations, orthopnea, claudication and leg swelling.  Gastrointestinal: Negative for nausea, vomiting and abdominal pain. Negative for heartburn, constipation, blood in stool and melena.  Genitourinary: Negative for dysuria, urgency, frequency, hematuria and flank pain.  Musculoskeletal: Negative for myalgias, back pain, joint pain and positive for falls.  Skin: Negative for itching and rash.  Neurological: Negative for dizziness and weakness. Negative for tingling, tremors,  sensory change, speech change, focal weakness, loss of consciousness and headaches.  Endo/Heme/Allergies: Negative for environmental allergies and polydipsia. Does not bruise/bleed easily.  Psychiatric/Behavioral: Negative for suicidal ideas. The  patient is not nervous/anxious.      Past Medical History  Diagnosis Date  . Dyslipidemia   . HTN (hypertension)   . Obesity   . Increased liver enzymes     felt to be a fatty liver per ultrasound in 2011  . Dyspnea   . Ischemic cardiomyopathy   . Spinal stenosis   . Sleep apnea     bipap   . CAD (coronary artery disease)     Silent MI sometime prior to 2000. S/P cath in 2000 showing a total mid RCA with collaterals from the LCX. EF is 40 to50%; Last Myoview in 2010 showing inferior scar without ischemia. EF was 52%.  Marland Kitchen Dysrhythmia     atrial fib   . Edema     lower extremity   . Cancer     skin cancer   . Anemia   . Diverticular disease dec 2015  . Umbilical hernia    Past Surgical History  Procedure Laterality Date  . Total hip arthroplasty    . Eye surgery    . Lower back surgery      spinal stenosis  . Shoulder surgery    . Cardioversion N/A 06/06/2012    Procedure: CARDIOVERSION;  Surgeon: Thayer Headings, MD;  Location: [place];  Service: Cardiovascular;  Laterality: N/A;  . Cardiac catheterization    . Partial colectomy N/A 06/12/2014    Procedure: SIGMOID COLECTOMY AND TAKEDOWN OF COLOVESICAL FISTULA ;  Surgeon: Armandina Gemma, MD;  Location: [facility];  Service: General;  Laterality: N/A;  . Umbilical hernia repair N/A 06/12/2014    Procedure: INCARCERATED UMBILICAL HERNIA REPAIR  ;  Surgeon: Armandina Gemma, MD;  Location: [facility];  Service: General;  Laterality: N/A;  . Cystostomy N/A 06/12/2014    Procedure: Jasmine December;  Surgeon: Franchot Gallo, MD;  Location: [facility];  Service: Urology;  Laterality: N/A;   Social History:  reports that he has never smoked. He has never used smokeless tobacco. He reports that he drinks alcohol. He reports that he does not use illicit drugs.  No Known Allergies  Family History:  Family History  Problem Relation Age of Onset  . Stroke Father   . Heart attack Father   . Hypertension Father   . Diabetes Father   . Drug abuse  Mother   . Hypertension Brother   . Hypertension Brother   . Hypertension Brother   . Hypertension Sister      Prior to Admission medications   Medication Sig Start Date End Date Taking? Authorizing Provider  doxazosin (CARDURA) 8 MG tablet Take 8 mg by mouth at bedtime.   Yes Historical Provider, MD  ezetimibe (ZETIA) 10 MG tablet Take 10 mg by mouth every morning.    Yes Historical Provider, MD  finasteride (PROSCAR) 5 MG tablet Take 5 mg by mouth daily.   Yes Historical Provider, MD  furosemide (LASIX) 40 MG tablet Take 40 mg by mouth as needed for fluid or edema (fluid).  04/29/12  Yes Sherren Mocha, MD  ibuprofen (ADVIL,MOTRIN) 200 MG tablet Take 400 mg by mouth every 6 (six) hours as needed for headache or moderate pain.   Yes Historical Provider, MD  levothyroxine (SYNTHROID, LEVOTHROID) 100 MCG tablet Take 100 mcg by mouth daily. 09/20/13  Yes Historical Provider, MD  metoprolol succinate (TOPROL-XL) 50 MG 24 hr tablet Take 50 mg by mouth every morning. Take with or immediately following a meal.   Yes Historical Provider, MD  Multiple Vitamin (MULTIVITAMIN) tablet Take 1 tablet by mouth every morning.    Yes Historical Provider, MD  Rivaroxaban (XARELTO) 15 MG TABS tablet Take 1 tablet (15 mg total) by mouth daily with supper. 01/18/14  Yes Robbie Lis, MD  saccharomyces boulardii (FLORASTOR) 250 MG capsule Take 250 mg by mouth every morning.    Yes Historical Provider, MD   Physical Exam: Filed Vitals:   07/12/14 1335 07/12/14 1519  BP: 135/74 125/101  Pulse: 77 54  Temp: 98.1 F (36.7 C) 97.9 F (36.6 C)  TempSrc: Oral Oral  Resp: 16 18  SpO2: 99% 96%    Physical Exam  Constitutional: Appears well-developed and well-nourished. No distress.  HENT: Normocephalic. No tonsillar erythema or exudates Eyes: Conjunctivae are normal. No scleral icterus.  Neck: Normal ROM. Neck supple. No JVD. No tracheal deviation. No thyromegaly.  CVS: irregular rhythm, rate controlled, S1/S2  appreciated  Pulmonary: Effort and breath sounds normal, no stridor, rhonchi, wheezes, rales.  Abdominal: Soft. BS +,  no distension, tenderness, rebound or guarding.  Musculoskeletal: Normal range of motion. No edema and no tenderness.  Lymphadenopathy: No lymphadenopathy noted, cervical, inguinal. Neuro: Alert. Normal reflexes, muscle tone coordination. No focal neurologic deficits. Skin: Skin is warm and dry. No rash noted.  No erythema. No pallor.  Psychiatric: Normal mood and affect. Behavior, judgment, thought content normal.   Labs on Admission:  Basic Metabolic Panel:  Recent Labs Lab 07/12/14 1335  NA 132*  K 4.3  CL 98*  CO2 24  GLUCOSE 124*  BUN 48*  CREATININE 2.57*  CALCIUM 8.5*   Liver Function Tests:  Recent Labs Lab 07/12/14 1335  AST 35  ALT 19  ALKPHOS 147*  BILITOT 0.8  PROT 7.3  ALBUMIN 2.8*    Recent Labs Lab 07/12/14 1335  LIPASE 64*   No results for input(s): AMMONIA in the last 168 hours. CBC:  Recent Labs Lab 07/12/14 1335  WBC 7.3  NEUTROABS 4.3  HGB 12.2*  HCT 35.6*  MCV 96.5  PLT 330   Cardiac Enzymes: No results for input(s): CKTOTAL, CKMB, CKMBINDEX, TROPONINI in the last 168 hours. BNP: Invalid input(s): POCBNP CBG: No results for input(s): GLUCAP in the last 168 hours.  If 7PM-7AM, please contact night-coverage [email or web address] Password [facility] 07/12/2014, 4:07 PM

## 2014-07-12 NOTE — ED Provider Notes (Signed)
CSN: [id number]     Arrival date & time 07/12/14  1316 History   First MD Initiated Contact with Patient 07/12/14 1451     Chief Complaint  Patient presents with  . Anorexia     (Consider location/radiation/quality/duration/timing/severity/associated sxs/prior Treatment) Patient is a 81 y.o. male presenting with fall. The history is provided by the patient and a relative.  Fall This is a new problem. The current episode started 12 to 24 hours ago. Episode frequency: once yesterday and once about 1 week ago. The problem has been resolved. Pertinent negatives include no chest pain, no headaches and no shortness of breath. Nothing aggravates the symptoms. Nothing relieves the symptoms. He has tried nothing for the symptoms. The treatment provided significant relief.    Past Medical History  Diagnosis Date  . Dyslipidemia   . HTN (hypertension)   . Obesity   . Increased liver enzymes     felt to be a fatty liver per ultrasound in 2011  . Dyspnea   . Ischemic cardiomyopathy   . Spinal stenosis   . Sleep apnea     bipap   . CAD (coronary artery disease)     Silent MI sometime prior to 2000. S/P cath in 2000 showing a total mid RCA with collaterals from the LCX. EF is 40 to50%; Last Myoview in 2010 showing inferior scar without ischemia. EF was 52%.  Marland Kitchen Dysrhythmia     atrial fib   . Edema     lower extremity   . Cancer     skin cancer   . Anemia   . Diverticular disease dec 2015  . Umbilical hernia    Past Surgical History  Procedure Laterality Date  . Total hip arthroplasty    . Eye surgery    . Lower back surgery      spinal stenosis  . Shoulder surgery    . Cardioversion N/A 06/06/2012    Procedure: CARDIOVERSION;  Surgeon: Thayer Headings, MD;  Location: [place];  Service: Cardiovascular;  Laterality: N/A;  . Cardiac catheterization    . Partial colectomy N/A 06/12/2014    Procedure: SIGMOID COLECTOMY AND TAKEDOWN OF COLOVESICAL FISTULA ;  Surgeon: Armandina Gemma, MD;   Location: [facility];  Service: General;  Laterality: N/A;  . Umbilical hernia repair N/A 06/12/2014    Procedure: INCARCERATED UMBILICAL HERNIA REPAIR  ;  Surgeon: Armandina Gemma, MD;  Location: [facility];  Service: General;  Laterality: N/A;  . Cystostomy N/A 06/12/2014    Procedure: Jasmine December;  Surgeon: Franchot Gallo, MD;  Location: [facility];  Service: Urology;  Laterality: N/A;   Family History  Problem Relation Age of Onset  . Stroke Father   . Heart attack Father   . Hypertension Father   . Diabetes Father   . Drug abuse Mother   . Hypertension Brother   . Hypertension Brother   . Hypertension Brother   . Hypertension Sister    History  Substance Use Topics  . Smoking status: Never Smoker   . Smokeless tobacco: Never Used  . Alcohol Use: Yes     Comment: 2 glasses of wine daily     Review of Systems  Constitutional: Positive for fatigue.  HENT: Negative for drooling and rhinorrhea.   Eyes: Negative for pain.  Respiratory: Negative for cough and shortness of breath.   Cardiovascular: Negative for chest pain and leg swelling.  Genitourinary: Positive for decreased urine volume.  Musculoskeletal: Negative for gait problem and neck pain.  Skin: Negative for color change.  Neurological: Negative for numbness and headaches.       2 falls  Hematological: Negative for adenopathy.  Psychiatric/Behavioral: Negative for behavioral problems.  All other systems reviewed and are negative.     Allergies  Review of patient's allergies indicates no known allergies.  Home Medications   Prior to Admission medications   Medication Sig Start Date End Date Taking? Authorizing Provider  diphenhydrAMINE (BENADRYL) 25 mg capsule Take 25 mg by mouth at bedtime.    Yes Historical Provider, MD  doxazosin (CARDURA) 8 MG tablet Take 8 mg by mouth at bedtime.   Yes Historical Provider, MD  ezetimibe (ZETIA) 10 MG tablet Take 10 mg by mouth every morning.    Yes Historical Provider, MD   finasteride (PROSCAR) 5 MG tablet Take 5 mg by mouth daily.   Yes Historical Provider, MD  furosemide (LASIX) 40 MG tablet Take 40 mg by mouth as needed for fluid or edema (fluid).  04/29/12  Yes Sherren Mocha, MD  HYDROcodone-acetaminophen (NORCO/VICODIN) 5-325 MG per tablet Take 1-2 tablets by mouth every 4 (four) hours as needed for moderate pain. 06/23/14  Yes Armandina Gemma, MD  ibuprofen (ADVIL,MOTRIN) 200 MG tablet Take 400 mg by mouth every 6 (six) hours as needed for headache or moderate pain.   Yes Historical Provider, MD  levothyroxine (SYNTHROID, LEVOTHROID) 100 MCG tablet Take 100 mcg by mouth daily. 09/20/13  Yes Historical Provider, MD  loperamide (IMODIUM) 1 MG/5ML solution Take 10 mLs (2 mg total) by mouth every 6 (six) hours as needed for diarrhea or loose stools. 01/18/14  Yes Robbie Lis, MD  metoprolol succinate (TOPROL-XL) 50 MG 24 hr tablet Take 50 mg by mouth every morning. Take with or immediately following a meal.   Yes Historical Provider, MD  Multiple Vitamin (MULTIVITAMIN) tablet Take 1 tablet by mouth every morning.    Yes Historical Provider, MD  Rivaroxaban (XARELTO) 15 MG TABS tablet Take 1 tablet (15 mg total) by mouth daily with supper. 01/18/14  Yes Robbie Lis, MD  saccharomyces boulardii (FLORASTOR) 250 MG capsule Take 250 mg by mouth every morning.    Yes Historical Provider, MD  ciprofloxacin (CIPRO) 500 MG tablet Take 1 tablet (500 mg total) by mouth 2 (two) times daily. Patient not taking: Reported on 07/12/2014 04/11/14   Michael Boston, MD  metroNIDAZOLE (FLAGYL) 500 MG tablet Take 1 tablet (500 mg total) by mouth 3 (three) times daily. Patient not taking: Reported on 07/12/2014 04/11/14   Michael Boston, MD   BP 125/101 mmHg  Pulse 54  Temp(Src) 97.9 F (36.6 C) (Oral)  Resp 18  SpO2 96% Physical Exam  Constitutional: He is oriented to person, place, and time. He appears well-developed and well-nourished.  HENT:  Head: Normocephalic and atraumatic.  Right Ear:  External ear normal.  Left Ear: External ear normal.  Nose: Nose normal.  Mouth/Throat: Oropharynx is clear and moist. No oropharyngeal exudate.  Eyes: Conjunctivae and EOM are normal. Pupils are equal, round, and reactive to light.  Neck: Normal range of motion. Neck supple.  Cardiovascular: Normal rate, regular rhythm, normal heart sounds and intact distal pulses.  Exam reveals no gallop and no friction rub.   No murmur heard. Pulmonary/Chest: Effort normal and breath sounds normal. No respiratory distress. He has no wheezes. He exhibits tenderness (mild tenderness to palpation of the right lateral lower ribs.).  Abdominal: Soft. Bowel sounds are normal. He exhibits no distension. There is no tenderness.  There is no rebound and no guarding.  Midline lower abdominal surgical scar appears clean, dry, intact. No abdominal tenderness.  Musculoskeletal: Normal range of motion. He exhibits no edema or tenderness.  Mild old appearing abrasion to the left midanterior shin.  Neurological: He is alert and oriented to person, place, and time.  alert, oriented x3 speech: normal in context and clarity memory: intact grossly cranial nerves II-XII: intact motor strength: full proximally and distally  sensation: intact to light touch diffusely  cerebellar: finger-to-nose and heel-to-shin intact gait: normal gait w/ my assistance, pt ambulates w/ a walker  Skin: Skin is warm and dry.  Psychiatric: He has a normal mood and affect. His behavior is normal.  Nursing note and vitals reviewed.   ED Course  Procedures (including critical care time) Labs Review Labs Reviewed  CBC WITH DIFFERENTIAL/PLATELET - Abnormal; Notable for the following:    RBC 3.69 (*)    Hemoglobin 12.2 (*)    HCT 35.6 (*)    RDW 15.7 (*)    All other components within normal limits  COMPREHENSIVE METABOLIC PANEL - Abnormal; Notable for the following:    Sodium 132 (*)    Chloride 98 (*)    Glucose, Bld 124 (*)    BUN 48  (*)    Creatinine, Ser 2.57 (*)    Calcium 8.5 (*)    Albumin 2.8 (*)    Alkaline Phosphatase 147 (*)    GFR calc non Af Amer 22 (*)    GFR calc Af Amer 26 (*)    All other components within normal limits  LIPASE, BLOOD - Abnormal; Notable for the following:    Lipase 64 (*)    All other components within normal limits  URINALYSIS, ROUTINE W REFLEX MICROSCOPIC (NOT AT [facility])    Imaging Review No results found.   EKG Interpretation   Date/Time:  Sunday July 12 2014 15:24:55 EDT Ventricular Rate:  101 PR Interval:    QRS Duration: 154 QT Interval:  393 QTC Calculation: 509 R Axis:   1 Text Interpretation:  Atrial fibrillation Right bundle branch block  Inferior infarct, age indeterminate No significant change since last  tracing Confirmed by Thaniel Coluccio  MD, Majid Mccravy ([id number]) on 07/12/2014 3:28:16 PM      MDM   Final diagnoses:  Acute renal failure, unspecified acute renal failure type  Other fatigue  Falls, initial encounter    3:20 PM 81 y.o. male w hx of CAD, afib on xarelto, spinal stenosis, s/p recent sigmoid colectomy for colovesical fistula by Dr. Harlow Asa who presents with worsening fatigue. The patient had also had some decreased urine output recently and had screening blood work performed by his primary care doctor 3 days ago which showed new onset acute renal failure. The patient was referred to the hospital for further evaluation. He denies any fevers. He has had 2 falls recently, one about one week ago in the shower and 1 yesterday out of the bed. He states that he slid down during both episodes and did not hit his head or lose consciousness. He states that he has some very mild right lateral rib pain but denies any other injuries. He denies any headache. His vital signs are unremarkable here. He continues to be in renal failure. Family does note that he has had decreased appetite and decreased intake likely contributing to this. Will admit to the hospitalist.  Normal  neuro exam. Ambulatory. No HA. Do not think CT head imaging needed. Will admit to  hospitalist.   Pamella Pert, MD 07/12/14 1550

## 2014-07-12 NOTE — ED Notes (Signed)
He states he has had "no appetite" since he had a colon resection late May of this year.  He phoned EMS today at the advisement of his pcp, who phoned to tell him he had abnormal lab results from his visit Thursday; mainly "elevated creatinine".  He arrives in no distress.

## 2014-07-12 NOTE — ED Notes (Signed)
Bed: EL07 Expected date: 07/12/14 Expected time: 1:20 PM Means of arrival: Ambulance Comments: Hold, Licciardi

## 2014-07-13 DIAGNOSIS — I10 Essential (primary) hypertension: Secondary | ICD-10-CM

## 2014-07-13 DIAGNOSIS — N179 Acute kidney failure, unspecified: Secondary | ICD-10-CM | POA: Diagnosis not present

## 2014-07-13 DIAGNOSIS — W19XXXS Unspecified fall, sequela: Secondary | ICD-10-CM

## 2014-07-13 LAB — CBC
HCT: 33.6 % — ABNORMAL LOW (ref 39.0–52.0)
Hemoglobin: 11.8 g/dL — ABNORMAL LOW (ref 13.0–17.0)
MCH: 33.7 pg (ref 26.0–34.0)
MCHC: 35.1 g/dL (ref 30.0–36.0)
MCV: 96 fL (ref 78.0–100.0)
Platelets: 307 10*3/uL (ref 150–400)
RBC: 3.5 MIL/uL — ABNORMAL LOW (ref 4.22–5.81)
RDW: 15.5 % (ref 11.5–15.5)
WBC: 7.1 10*3/uL (ref 4.0–10.5)

## 2014-07-13 LAB — BASIC METABOLIC PANEL
Anion gap: 10 (ref 5–15)
BUN: 46 mg/dL — ABNORMAL HIGH (ref 6–20)
CHLORIDE: 101 mmol/L (ref 101–111)
CO2: 23 mmol/L (ref 22–32)
Calcium: 8.3 mg/dL — ABNORMAL LOW (ref 8.9–10.3)
Creatinine, Ser: 2.35 mg/dL — ABNORMAL HIGH (ref 0.61–1.24)
GFR calc Af Amer: 28 mL/min — ABNORMAL LOW (ref >60)
GFR, EST NON AFRICAN AMERICAN: 25 mL/min — AB (ref >60)
GLUCOSE: 105 mg/dL — AB (ref 65–99)
POTASSIUM: 4.2 mmol/L (ref 3.5–5.1)
Sodium: 134 mmol/L — ABNORMAL LOW (ref 135–145)

## 2014-07-13 LAB — GLUCOSE, CAPILLARY: Glucose-Capillary: 96 mg/dL (ref 65–99)

## 2014-07-13 MED ORDER — SODIUM CHLORIDE 0.9 % IV SOLN
INTRAVENOUS | Status: DC
  Administered 2014-07-13: 10:00:00 via INTRAVENOUS

## 2014-07-13 MED ORDER — SODIUM CHLORIDE 0.9 % IV SOLN
INTRAVENOUS | Status: AC
  Administered 2014-07-13: 19:00:00 via INTRAVENOUS

## 2014-07-13 MED ORDER — ZOLPIDEM TARTRATE 5 MG PO TABS
5.0000 mg | ORAL_TABLET | Freq: Every evening | ORAL | Status: DC | PRN
  Administered 2014-07-13: 5 mg via ORAL
  Filled 2014-07-13: qty 1

## 2014-07-13 MED ORDER — DIPHENHYDRAMINE HCL 25 MG PO CAPS: 25.0000 mg | ORAL_CAPSULE | Freq: Every evening | ORAL | Status: DC | PRN

## 2014-07-13 NOTE — Progress Notes (Signed)
Initial Nutrition Assessment  DOCUMENTATION CODES:  Severe malnutrition in context of chronic illness, Obesity unspecified  INTERVENTION:  Ensure Enlive (each supplement provides 350kcal and 20 grams of protein) BID Encourage PO intake RD to continue to monitor  NUTRITION DIAGNOSIS:  Malnutrition related to chronic illness as evidenced by percent weight loss, energy intake < or equal to 75% for > or equal to 1 month.  GOAL:  Patient will meet greater than or equal to 90% of their needs  MONITOR:  PO intake, Supplement acceptance, Labs, Weight trends, Skin, I & O's  REASON FOR ASSESSMENT:  Malnutrition Screening Tool    ASSESSMENT: 81 year old male with past medical history of atrial fibrillation on xarelto, dyslipidemia, CAD, chronic systolic and diastolic CHF, recent rehabilitation after hospitalization from 06/12/2014 to 06/23/2014 for sigmoid colectomy and bladder repair for management of diverticular disease with colovesical fistula.  Pt reports poor appetite x 6-8 months. Pt has been ordered Ensure supplements BID, pt was drinking one during visit. Pt reports 85 lb weight loss. Per weight history documentation, pt has lost 37 lb since 12/09/13 (16% weight loss x 7 months, significant for time frame).  Nutrition-Focused physical exam completed. Findings are moderate fat depletion, moderate muscle depletion, and no edema.   Labs reviewed: Mg/Phos WNL Elevated BUN & creatinine Low Na  Height:  Ht Readings from Last 1 Encounters:  07/12/14 5\' 5"  (1.651 m)    Weight:  Wt Readings from Last 1 Encounters:  07/12/14 190 lb (86.183 kg)    Ideal Body Weight:  61.8 kg  Wt Readings from Last 10 Encounters:  07/12/14 190 lb (86.183 kg)  06/30/14 193 lb 6.4 oz (87.726 kg)  06/26/14 193 lb 6.4 oz (87.726 kg)  06/24/14 193 lb 6.4 oz (87.726 kg)  06/12/14 205 lb 14.6 oz (93.4 kg)  06/10/14 199 lb (90.266 kg)  05/20/14 201 lb 12.8 oz (91.536 kg)  05/05/14 205 lb 1.9 oz  (93.042 kg)  04/11/14 205 lb 7.5 oz (93.2 kg)  01/17/14 214 lb 8.1 oz (97.3 kg)    BMI:  Body mass index is 31.62 kg/(m^2).  Estimated Nutritional Needs:  Kcal:  1800-2000  Protein:  85-95g  Fluid:  1.8L/day     Skin:  Reviewed, no issues  Diet Order:  Diet regular Room service appropriate?: Yes; Fluid consistency:: Thin  EDUCATION NEEDS:  No education needs identified at this time   Intake/Output Summary (Last 24 hours) at 07/13/14 1052 Last data filed at 07/13/14 0729  Gross per 24 hour  Intake    425 ml  Output   1025 ml  Net   -600 ml    Last BM:  6/25  Clayton Bibles, MS, RD, LDN Pager: [phone] After Hours Pager: [phone]

## 2014-07-13 NOTE — Progress Notes (Signed)
Patient ID: Harold Jennings, male   DOB: [birth year], 81 y.o.   MRN: [id number] [facility] PROGRESS NOTE  Harold Jennings TZG:[id number] DOB: [birth year] DOA: 07/12/2014 PCP: London Pepper, MD  Brief narrative:    81 year old male with past medical history of atrial fibrillation on xarelto, dyslipidemia, CAD, chronic systolic and diastolic CHF (last 2 D ECHO in 08/2013 with EF of 45%), recent rehabilitation after hospitalization from 06/12/2014 to 06/23/2014 for sigmoid colectomy and bladder repair for management of diverticular disease with colovesical fistula. He presented to [facility] ED because recently he had blood work done by PCP and was told he has worsening kidney function. He also reported a few falls at home when he accidentally slid but no reports of loss of consciousness, lightheadedness or hitting his head during the fall.  Vital signs were stable on the admission. Blood work revealed acutely elevated creatinine at 2.57 and his baseline is within normal limits. He was given low rate IV fluids.  Anticipated discharge: 07/14/2014 if kidney function is closer to baseline values.  Assessment/Plan:    Principal Problem:  Fall - Unclear etiology - Patient did not have a CAT scan of the head at the time of the admission because he did not his head during the fall. - PT evaluation is pending  Active Problems:  ARF (acute renal failure) - Likely from Lasix. - Lasix placed on hold at the time of the admission. - We gave patient low rate IV fluids and creatinine improved to 2.3 - We'll continue IV fluids for next 8 hours at a low rate and recheck BMP tomorrow.   Dyslipidemia - Continue ezetimibe   Essential hypertension - Continue metoprolol - Blood pressures 114/64   Hypothyroidism - Continue synthroid   Chronic combined diastolic and systolic CHF - Compensated - Last 2 D ECHO in 08/2013 with EF of 45% - Lasix on hold due to acute renal failure   Chronic atrial  fibrillation - CHADS vasc score at least 3 - Slightly tachycardic, heart rate 103. Continue current metoprolol. - On full dose AC with xarelto   Colovesical fistula - Recent surgery - Stable   BPH (benign prostatic hyperplasia) - Continue cardura and proscar    DVT Prophylaxis  - On full dose anticoagulation with xarelto   Code Status: Full.  Family Communication:  plan of care discussed with the patient; patient's family is not at the bedside this morning. Disposition Plan: Home once renal function closer to normal values   IV access:  Peripheral IV  Procedures and diagnostic studies:    No results found.  Medical Consultants:  None   Other Consultants:  Physical therapy  IAnti-Infectives:   None    DEVINE, ALMA, MD  [facility] Pager [phone]  Time spent in minutes: 15 minutes  If 7PM-7AM, please contact night-coverage [email or web address] Password [facility] 07/13/2014, 10:37 AM   LOS: 1 day    HPI/Subjective: No acute overnight events. Patient reports no vomiting, no shortness of breath.   Objective: Filed Vitals:   07/12/14 1519 07/12/14 1706 07/12/14 2023 07/13/14 0523  BP: 125/101 130/85 128/90 114/64  Pulse: 54 100 59 103  Temp: 97.9 F (36.6 C) 97.9 F (36.6 C) 98.1 F (36.7 C) 97.9 F (36.6 C)  TempSrc: Oral Oral Oral Oral  Resp: 18 18 16 18   Height:  5\' 5"  (1.651 m)    Weight:  86.183 kg (190 lb)    SpO2: 96% 96% 99% 96%    Intake/Output Summary (  Last 24 hours) at 07/13/14 1037 Last data filed at 07/13/14 0729  Gross per 24 hour  Intake    425 ml  Output   1025 ml  Net   -600 ml    Exam:   General:  Pt is alert, follows commands appropriately, not in acute distress  Cardiovascular: Regular rate and rhythm, S1/S2 (+)  Respiratory: Clear to auscultation bilaterally, no wheezing, no crackles, no rhonchi  Abdomen: Soft, non tender, non distended, bowel sounds present  Extremities: No edema, pulses DP and PT palpable  bilaterally  Neuro: Grossly nonfocal  Data Reviewed: Basic Metabolic Panel:  Recent Labs Lab 07/12/14 1335 07/12/14 1745 07/13/14 0435  NA 132* 132* 134*  K 4.3 4.4 4.2  CL 98* 97* 101  CO2 24 26 23   GLUCOSE 124* 99 105*  BUN 48* 49* 46*  CREATININE 2.57* 2.67* 2.35*  CALCIUM 8.5* 8.6* 8.3*  MG  --  2.1  --   PHOS  --  4.5  --    Liver Function Tests:  Recent Labs Lab 07/12/14 1335 07/12/14 1745  AST 35 32  ALT 19 20  ALKPHOS 147* 153*  BILITOT 0.8 0.8  PROT 7.3 7.5  ALBUMIN 2.8* 2.9*    Recent Labs Lab 07/12/14 1335  LIPASE 64*   No results for input(s): AMMONIA in the last 168 hours. CBC:  Recent Labs Lab 07/12/14 1335 07/12/14 1745 07/13/14 0435  WBC 7.3 7.6 7.1  NEUTROABS 4.3 4.7  --   HGB 12.2* 12.6* 11.8*  HCT 35.6* 36.7* 33.6*  MCV 96.5 96.3 96.0  PLT 330 324 307   Cardiac Enzymes: No results for input(s): CKTOTAL, CKMB, CKMBINDEX, TROPONINI in the last 168 hours. BNP: Invalid input(s): POCBNP CBG:  Recent Labs Lab 07/13/14 0716  GLUCAP 96    Recent Results (from the past 240 hour(s))  MRSA PCR Screening     Status: None   Collection Time: 07/12/14  1:16 PM  Result Value Ref Range Status   MRSA by PCR NEGATIVE NEGATIVE Final     Scheduled Meds: . doxazosin  8 mg Oral QHS  . ezetimibe  10 mg Oral q morning - 10a  . feeding supplement (ENSURE ENLIVE)  237 mL Oral BID BM  . finasteride  5 mg Oral Daily  . levothyroxine  100 mcg Oral QAC breakfast  . metoprolol succinate  50 mg Oral q morning - 10a  . multivitamin with minerals  1 tablet Oral q morning - 10a  . Rivaroxaban  15 mg Oral Q supper  . saccharomyces boulardii  250 mg Oral q morning - 10a   Continuous Infusions: . sodium chloride 50 mL/hr at 07/13/14 1020

## 2014-07-13 NOTE — Evaluation (Signed)
Physical Therapy Evaluation Patient Details Name: Harold Jennings MRN: [id number] DOB: [birth year] Today's Date: 07/13/2014   History of Present Illness  81 year old male with past medical history of atrial fibrillation on xarelto, dyslipidemia, CAD, chronic systolic and diastolic CHF (last 2 D ECHO in 08/2013 with EF of 45%), recent rehabilitation after hospitalization from 06/12/2014 to 06/23/2014 for sigmoid colectomy and bladder repair for management of diverticular disease with colovesical fistula and admitted for acute renal failure and falls.  Clinical Impression  Pt admitted with above diagnosis. Pt currently with functional limitations due to the deficits listed below (see PT Problem List).  Pt will benefit from skilled PT to increase their independence and safety with mobility to allow discharge to the venue listed below.  Pt had just started HHPT (SNF upon recent hospitalization d/c) and was still using RW prior to admission.  Pt reports "sliding" to floor in shower and off of bed a few times prior to admission as well.  Spouse reports some recent difficulty with steps into/out of home so verbally educated on safe sequence and instructed to use one hand rail with pt turned towards rail since pt needs both hands to assist (spouse states cane was too wobbly) to get into home and then have HHPT assist with further instructions.     Follow Up Recommendations Home health PT    Equipment Recommendations  None recommended by PT    Recommendations for Other Services       Precautions / Restrictions Precautions Precautions: Fall      Mobility  Bed Mobility Overal bed mobility: Needs Assistance Bed Mobility: Supine to Sit;Sit to Supine Rolling: Min guard   Supine to sit: Mod assist     General bed mobility comments: assist for LEs onto bed  Transfers Overall transfer level: Needs assistance Equipment used: Rolling walker (2 wheeled) Transfers: Sit to/from Stand Sit to Stand: Min  guard         General transfer comment: good hand placement, min/guard for safety  Ambulation/Gait Ambulation/Gait assistance: Min guard Ambulation Distance (Feet): 140 Feet Assistive device: Rolling walker (2 wheeled) Gait Pattern/deviations: Step-through pattern Gait velocity: decreased   General Gait Details: pt reports feeling very weak and fatigued  Stairs            Wheelchair Mobility    Modified Rankin (Stroke Patients Only)       Balance                                             Pertinent Vitals/Pain Pain Assessment: No/denies pain    Home Living Family/patient expects to be discharged to:: Private residence Living Arrangements: Spouse/significant other   Type of Home: House Home Access: Stairs to enter Entrance Stairs-Rails: Psychiatric nurse of Steps: 3 + 3 Home Layout: Two level;Able to live on main level with bedroom/bathroom Home Equipment: Walker - 4 wheels;Cane - single point;Bedside commode;Grab bars - tub/shower;Grab bars - toilet;Shower seat - built in      Prior Function                 Hand Dominance        Extremity/Trunk Assessment               Lower Extremity Assessment: Generalized weakness         Communication      Cognition Arousal/Alertness:  Awake/alert Behavior During Therapy: WFL for tasks assessed/performed Overall Cognitive Status: Within Functional Limits for tasks assessed                      General Comments      Exercises        Assessment/Plan    PT Assessment Patient needs continued PT services  PT Diagnosis Generalized weakness   PT Problem List Decreased activity tolerance;Decreased mobility;Decreased knowledge of use of DME;Decreased strength  PT Treatment Interventions DME instruction;Gait training;Stair training;Functional mobility training;Therapeutic activities;Patient/family education;Therapeutic exercise   PT Goals (Current  goals can be found in the Care Plan section) Acute Rehab PT Goals PT Goal Formulation: With patient Time For Goal Achievement: 07/20/14    Frequency Min 3X/week   Barriers to discharge        Co-evaluation               End of Session Equipment Utilized During Treatment: Gait belt Activity Tolerance: Patient tolerated treatment well Patient left: in bed;with call bell/phone within reach;with bed alarm set;with family/visitor present           Time: 0263-7858 PT Time Calculation (min) (ACUTE ONLY): 21 min   Charges:   PT Evaluation $Initial PT Evaluation Tier I: 1 Procedure     PT G Codes:        Harold Jennings,Harold Jennings 07/13/2014, 4:25 PM Carmelia Bake, PT, DPT 07/13/2014 Pager: [phone]

## 2014-07-13 NOTE — Progress Notes (Signed)
PT Cancellation Note  Patient Details Name: Harold Jennings MRN: [id number] DOB: 11-15-1932   Cancelled Treatment:    Reason Eval/Treat Not Completed: Patient declined, no reason specified Pt requested no mobility this morning, willing to attempt after lunch.  Will check back as schedule permits.   Harold Jennings,[name] 07/13/2014, 11:48 AM Carmelia Bake, PT, DPT 07/13/2014 Pager: [phone]

## 2014-07-14 ENCOUNTER — Encounter: Payer: Self-pay | Admitting: *Deleted

## 2014-07-14 DIAGNOSIS — E785 Hyperlipidemia, unspecified: Secondary | ICD-10-CM

## 2014-07-14 LAB — BASIC METABOLIC PANEL
ANION GAP: 10 (ref 5–15)
BUN: 37 mg/dL — AB (ref 6–20)
CALCIUM: 8 mg/dL — AB (ref 8.9–10.3)
CO2: 22 mmol/L (ref 22–32)
CREATININE: 2.08 mg/dL — AB (ref 0.61–1.24)
Chloride: 102 mmol/L (ref 101–111)
GFR, EST AFRICAN AMERICAN: 33 mL/min — AB (ref >60)
GFR, EST NON AFRICAN AMERICAN: 28 mL/min — AB (ref >60)
Glucose, Bld: 124 mg/dL — ABNORMAL HIGH (ref 65–99)
Potassium: 3.9 mmol/L (ref 3.5–5.1)
Sodium: 134 mmol/L — ABNORMAL LOW (ref 135–145)

## 2014-07-14 LAB — GLUCOSE, CAPILLARY: Glucose-Capillary: 96 mg/dL (ref 65–99)

## 2014-07-14 NOTE — Care Management (Signed)
Important Message  Patient Details  Name: Harold Jennings MRN: [id number] Date of Birth: August 09, 1932   Medicare Important Message Given:       Camillo Flaming, [place] 07/14/2014, 3:29 PM

## 2014-07-14 NOTE — Discharge Instructions (Signed)
[facility] Stay Proper nutrition can help your body recover from illness and injury.   Foods and beverages high in protein, vitamins, and minerals help rebuild muscle loss, promote healing, & reduce fall risk.   In addition to eating healthy foods, a nutrition shake is an easy, delicious way to get the nutrition you need during and after your hospital stay  It is recommended that you continue to drink 2 bottles per day of:       Ensure Enlive for at least 1 month (30 days) after your hospital stay   Tips for adding a nutrition shake into your routine: As allowed, drink one with vitamins or medications instead of water or juice Enjoy one as a tasty mid-morning or afternoon snack Drink cold or make a milkshake out of it Drink one instead of milk with cereal or snacks Use as a coffee creamer   Available at the following grocery stores and pharmacies:           * [place] [phone]            For COUPONS visit: www.ensure.com/join or http://dawson-may.com/   Suggested Substitutions Ensure Plus = Boost Plus = Carnation Breakfast Essentials = Boost Compact Ensure Active Clear = Boost Breeze Glucerna Shake = Boost Glucose Control = Carnation Breakfast Essentials SUGAR FREE    Acute Kidney Injury Acute kidney injury is a disease in which there is sudden (acute) damage to the kidneys. The kidneys are 2 organs that lie on either side of the spine between the middle of the back and the front of the abdomen. The kidneys:  Remove wastes and extra water from the blood.   Produce important hormones. These help keep bones strong, regulate blood pressure, and help create red blood cells.   Balance the fluids and chemicals in the blood and tissues. A small amount of kidney damage may not cause problems, but a  large amount of damage may make it difficult or impossible for the kidneys to work the way they should. Acute kidney injury may develop into long-lasting (chronic) kidney disease. It may also develop into a life-threatening disease called end-stage kidney disease. Acute kidney injury can get worse very quickly, so it should be treated right away. Early treatment may prevent other kidney diseases from developing.  CAUSES   A problem with blood flow to the kidneys. This may be caused by:   Blood loss.   Heart disease.   Severe burns.   Liver disease.  Direct damage to the kidneys. This may be caused by:  Some medicines.   A kidney infection.   Poisoning or consuming toxic substances.   A surgical wound.   A blow to the kidney area.   A problem with urine flow. This may be caused by:   Cancer.   Kidney stones.   An enlarged prostate. SYMPTOMS   Swelling (edema) of the legs, ankles, or feet.   Tiredness (lethargy).   Nausea or vomiting.   Confusion.   Problems with urination, such as:   Painful or burning feeling during urination.   Decreased urine production.   Frequent accidents in children who are potty trained.   Bloody urine.  Muscle twitches and cramps.   Shortness of breath.   Seizures.   Chest pain or pressure. Sometimes, no symptoms are present. DIAGNOSIS Acute kidney injury may be detected and diagnosed by tests, including blood, urine, imaging, or kidney biopsy tests.  TREATMENT Treatment of acute kidney injury varies depending on the cause and severity of the kidney damage. In mild cases, no treatment may be needed. The kidneys may heal on their own. If acute kidney injury is more severe, your caregiver will treat the cause of the kidney damage, help the kidneys heal, and prevent complications from occurring. Severe cases may require a procedure to remove toxic wastes from the body (dialysis) or surgery to repair kidney  damage. Surgery may involve:   Repair of a torn kidney.   Removal of an obstruction. Most of the time, you will need to stay overnight at the hospital.  HOME CARE INSTRUCTIONS:  Follow your prescribed diet.  Only take over-the-counter or prescription medicines as directed by your caregiver.  Do not take any new medicines (prescription, over-the-counter, or nutritional supplements) unless approved by your caregiver. Many medicines can worsen your kidney damage or need to have the dose adjusted.   Keep all follow-up appointments as directed by your caregiver.  Observe your condition to make sure you are healing as expected. SEEK IMMEDIATE MEDICAL CARE IF:  You are feeling ill or have severe pain in the back or side.   Your symptoms return or you have new symptoms.  You have any symptoms of end-stage kidney disease. These include:   Persistent itchiness.   Loss of appetite.   Headaches.   Abnormally dark or light skin.  Numbness in the hands or feet.   Easy bruising.   Frequent hiccups.   Menstruation stops.   You have a fever.  You have increased urine production.  You have pain or bleeding when urinating. MAKE SURE YOU:   Understand these instructions.  Will watch your condition.  Will get help right away if you are not doing well or get worse Document Released: 07/18/2010 Document Revised: 04/29/2012 Document Reviewed: 09/01/2011 [facility] Patient Information 2015 [place], [place]. This information is not intended to replace advice given to you by your health care provider. Make sure you discuss any questions you have with your health care provider.

## 2014-07-14 NOTE — Progress Notes (Signed)
Discharge instructions reviewed with patient and spouse.  Patient will follow-up with PCP next week for lab draw.  Both patient and wife verbalized understanding.  Patient taken to lobby via wheelchair to be discharged home with spouse.

## 2014-07-14 NOTE — Consult Note (Signed)
   [facility] CM Inpatient Consult   07/14/2014  Harold Jennings Jan 08, 1933 [id number]   Came to visit patient prior to bedside. Spoke with patient and wife to explain and discuss [facility] Care Management. Written consent obtained. Explained to wife that patient will receive post hospital discharge transition of care calls and will be evaluated for monthly home visits. Patient's wife reports patient is also active with [facility]. Explained to both patient and wife that [place] Management will not replace or interfere with home health services. Will make referral for [facility] RNCM for CHF disease management and education. Spoke with [facility] Liaison to confirm patient active with [place]. Will make inpatient RNCM aware of bedside visit prior to discharge. Confirmed Primary Care MD as Dr. Orland Mustard. Confirmed best contact number as home number [phone]. Left [facility] Care Management packet at bedside along with contact information. Both patient and his wife, Harold Jennings, appreciative of visit.  Marthenia Rolling, MSN-Ed, RN,BSN [facility] Liaison [phone]

## 2014-07-14 NOTE — Discharge Summary (Addendum)
Physician Discharge Summary  Harold Jennings IWL:[id number] DOB: 17-Sep-1932 DOA: 07/12/2014  PCP: London Pepper, MD  Admit date: 07/12/2014 Discharge date: 07/14/2014  Recommendations for Outpatient Follow-up:  1. Pt instructed to hold lasix on discharge until his renal function recheck on an outpt basis nad if better he may then resume lasix if ok by PCP.  Discharge Diagnoses:  Principal Problem:   Fall Active Problems:   ARF (acute renal failure)   Dyslipidemia   Essential hypertension   Chronic atrial fibrillation   Colovesical fistula   BPH (benign prostatic hyperplasia)   Hypothyroidism    Discharge Condition: stable   Diet recommendation: as tolerated   History of present illness:  81 year old male with past medical history of atrial fibrillation on xarelto, dyslipidemia, CAD, chronic systolic and diastolic CHF (last 2 D ECHO in 08/2013 with EF of 45%), recent rehabilitation after hospitalization from 06/12/2014 to 06/23/2014 for sigmoid colectomy and bladder repair for management of diverticular disease with colovesical fistula. He presented to [facility] ED because recently he had blood work done by PCP and was told he has worsening kidney function. He also reported a few falls at home when he accidentally slid but no reports of loss of consciousness, lightheadedness or hitting his head during the fall.  Vital signs were stable on the admission. Blood work revealed acutely elevated creatinine at 2.57 and his baseline is within normal limits. He was given low rate IV fluids.  Hospital Course:   Assessment/Plan:    Principal Problem:  Fall - Unclear etiology - Patient did not have a CT scan of the head at the time of the admission because he did not his head during the fall. - PT evaluation recommends HHPT  Active Problems:  ARF (acute renal failure) - Likely from Lasix. - Lasix placed on hold at the time of the admission. - We gave patient low rate IV fluids and  creatinine improved to 2.3 and this am 2.08 - We'll continue to hold lasix on discharge as noted above    Dyslipidemia - Continue ezetimibe on discharge    Essential hypertension - Continue metoprolol   Hypothyroidism - Continue synthroid   Chronic combined diastolic and systolic CHF - Compensated - Last 2 D ECHO in 08/2013 with EF of 45% - Lasix on hold due to acute renal failure   Chronic atrial fibrillation - CHADS vasc score at least 3 - Continue metoprolol. - On full dose AC with xarelto   Colovesical fistula - Recent surgery - Stable   BPH (benign prostatic hyperplasia) - Continue cardura and proscar as per prior to this admission    Severe protein calorie malnutrition - In the context of chronic illness - Nutrition consulted     DVT Prophylaxis  - On full dose anticoagulation with xarelto   Code Status: Full.  Family Communication: plan of care discussed with the patient   IV access:  Peripheral IV  Procedures and diagnostic studies:   No results found.  Medical Consultants:  None   Other Consultants:  Physical therapy  IAnti-Infectives:   None      Signed:  Leisa Lenz, MD  [facility] 07/14/2014, 9:00 AM  Pager #: [phone]  Time spent in minutes: more than 30 minutes   Discharge Exam: Filed Vitals:   07/14/14 0557  BP: 109/53  Pulse: 52  Temp: 98.3 F (36.8 C)  Resp: 18   Filed Vitals:   07/13/14 0523 07/13/14 1400 07/13/14 2124 07/14/14 0557  BP: 114/64 116/62 117/73 109/53  Pulse: 103 62 83 52  Temp: 97.9 F (36.6 C) 97.2 F (36.2 C) 99 F (37.2 C) 98.3 F (36.8 C)  TempSrc: Oral Oral Oral Oral  Resp: 18 18 18 18   Height:      Weight:    86.637 kg (191 lb)  SpO2: 96% 96% 96% 96%    General: Pt is alert, follows commands appropriately, not in acute distress Cardiovascular: Regular rate and rhythm, S1/S2 (+) Respiratory: Clear to auscultation bilaterally, no wheezing, no  crackles, no rhonchi Abdominal: Soft, non tender, non distended, bowel sounds +, no guarding Extremities: no edema, no cyanosis, pulses palpable bilaterally DP and PT Neuro: Grossly nonfocal  Discharge Instructions  Discharge Instructions    Call MD for:  difficulty breathing, headache or visual disturbances    Complete by:  As directed      Call MD for:  persistant nausea and vomiting    Complete by:  As directed      Call MD for:  severe uncontrolled pain    Complete by:  As directed      Diet - low sodium heart healthy    Complete by:  As directed      Increase activity slowly    Complete by:  As directed             Medication List    STOP taking these medications        ciprofloxacin 500 MG tablet  Commonly known as:  CIPRO     furosemide 40 MG tablet  Commonly known as:  LASIX     ibuprofen 200 MG tablet  Commonly known as:  ADVIL,MOTRIN     metroNIDAZOLE 500 MG tablet  Commonly known as:  FLAGYL      TAKE these medications        diphenhydrAMINE 25 mg capsule  Commonly known as:  BENADRYL  Take 25 mg by mouth at bedtime.     doxazosin 8 MG tablet  Commonly known as:  CARDURA  Take 8 mg by mouth at bedtime.     ezetimibe 10 MG tablet  Commonly known as:  ZETIA  Take 10 mg by mouth every morning.     finasteride 5 MG tablet  Commonly known as:  PROSCAR  Take 5 mg by mouth daily.     HYDROcodone-acetaminophen 5-325 MG per tablet  Commonly known as:  NORCO/VICODIN  Take 1-2 tablets by mouth every 4 (four) hours as needed for moderate pain.     levothyroxine 100 MCG tablet  Commonly known as:  SYNTHROID, LEVOTHROID  Take 100 mcg by mouth daily.     loperamide 1 MG/5ML solution  Commonly known as:  IMODIUM  Take 10 mLs (2 mg total) by mouth every 6 (six) hours as needed for diarrhea or loose stools.     metoprolol succinate 50 MG 24 hr tablet  Commonly known as:  TOPROL-XL  Take 50 mg by mouth every morning. Take with or immediately following a  meal.     multivitamin tablet  Take 1 tablet by mouth every morning.     Rivaroxaban 15 MG Tabs tablet  Commonly known as:  XARELTO  Take 1 tablet (15 mg total) by mouth daily with supper.     saccharomyces boulardii 250 MG capsule  Commonly known as:  FLORASTOR  Take 250 mg by mouth every morning.           Follow-up Information    Follow  up with London Pepper, MD. Schedule an appointment as soon as possible for a visit in 1 week.   Specialty:  Family Medicine   Why:  Follow up appt after recent hospitalization   Contact information:   [address] [place] [id number] [phone]        The results of significant diagnostics from this hospitalization (including imaging, microbiology, ancillary and laboratory) are listed below for reference.    Significant Diagnostic Studies: Dg Cystogram  06/18/2014   CLINICAL DATA:  81 year old male with history of complicated diverticulitis with colovesical fistula. Status post surgical repair.  EXAM: CYSTOGRAM  TECHNIQUE: The bladder was filled with 200 mL Cysto-Hypaque 30% by drip infusion via the patient's indwelling Foley catheter. Serial spot images were obtained during bladder filling and post draining.  FLUOROSCOPY TIME:  6 minutes and 13 seconds  COMPARISON:  CT of the abdomen and pelvis 04/22/2014.  FINDINGS: Urinary bladder is highly trabeculated, with multiple small bladder wall diverticulae. At no point during the examination was there a fistulous communication identified between the bladder an adjacent loops of bowel.  IMPRESSION: 1. No evidence of residual colovesical fistula. 2. Highly trabeculated urinary bladder with multiple small bladder wall diverticulae.   Electronically Signed   By: Vinnie Langton M.D.   On: 06/18/2014 12:15   Dg Chest Port 1 View  06/15/2014   CLINICAL DATA:  Shortness of breath and chest tightness. Three days post sigmoid colectomy, colovesical fistula repair and incarcerated  umbilical hernia repair.  EXAM: PORTABLE CHEST - 1 VIEW  COMPARISON:  04/08/2014  FINDINGS: Bibasilar atelectasis present. There may be a small amount of left pleural fluid. There is no evidence of pulmonary edema, consolidation, pneumothorax, nodule or pleural fluid. Stable mild cardiomegaly.  IMPRESSION: Bibasilar atelectasis and potential small left pleural effusion.   Electronically Signed   By: Aletta Edouard M.D.   On: 06/15/2014 14:17    Microbiology: Recent Results (from the past 240 hour(s))  MRSA PCR Screening     Status: None   Collection Time: 07/12/14  1:16 PM  Result Value Ref Range Status   MRSA by PCR NEGATIVE NEGATIVE Final    Comment:        The GeneXpert MRSA Assay (FDA approved for NASAL specimens only), is one component of a comprehensive MRSA colonization surveillance program. It is not intended to diagnose MRSA infection nor to guide or monitor treatment for MRSA infections.      Labs: Basic Metabolic Panel:  Recent Labs Lab 07/12/14 1335 07/12/14 1745 07/13/14 0435 07/14/14 0810  NA 132* 132* 134* 134*  K 4.3 4.4 4.2 3.9  CL 98* 97* 101 102  CO2 24 26 23 22   GLUCOSE 124* 99 105* 124*  BUN 48* 49* 46* 37*  CREATININE 2.57* 2.67* 2.35* 2.08*  CALCIUM 8.5* 8.6* 8.3* 8.0*  MG  --  2.1  --   --   PHOS  --  4.5  --   --    Liver Function Tests:  Recent Labs Lab 07/12/14 1335 07/12/14 1745  AST 35 32  ALT 19 20  ALKPHOS 147* 153*  BILITOT 0.8 0.8  PROT 7.3 7.5  ALBUMIN 2.8* 2.9*    Recent Labs Lab 07/12/14 1335  LIPASE 64*   No results for input(s): AMMONIA in the last 168 hours. CBC:  Recent Labs Lab 07/12/14 1335 07/12/14 1745 07/13/14 0435  WBC 7.3 7.6 7.1  NEUTROABS 4.3 4.7  --   HGB 12.2* 12.6*  11.8*  HCT 35.6* 36.7* 33.6*  MCV 96.5 96.3 96.0  PLT 330 324 307   Cardiac Enzymes: No results for input(s): CKTOTAL, CKMB, CKMBINDEX, TROPONINI in the last 168 hours. BNP: BNP (last 3 results) No results for input(s): BNP  in the last 8760 hours.  ProBNP (last 3 results) No results for input(s): PROBNP in the last 8760 hours.  CBG:  Recent Labs Lab 07/13/14 0716 07/14/14 0736  GLUCAP 96 96

## 2014-07-14 NOTE — Care Management Note (Signed)
Case Management Note  Patient Details  Name: Harold Jennings MRN: [id number] Date of Birth: April 10, 1932  Subjective/Objective:        Pt admitted with acute renal failure            Action/Plan: From home with [place] from [place]  Expected Discharge Date:  07/16/14               Expected Discharge Plan:  [place]  In-House Referral:     Discharge planning Services  CM Consult  Post Acute Care Choice:  Resumption of Svcs/PTA Provider Choice offered to:  Patient  DME Arranged:    DME Agency:     HH Arranged:  PT, OT, RN [place] Agency:  [place]  Status of Service:  In process, will continue to follow  Medicare Important Message Given:    Date Medicare IM Given:    Medicare IM give by:    Date Additional Medicare IM Given:    Additional Medicare Important Message give by:     If discussed at [place] of Stay Meetings, dates discussed:    Additional Comments: Spoke with pt at bedside who states he is currently using services of [facility]. He states he would like to continue using them at DC. MD orders written for PT/OT/RN. [facility] HH rep called for resumption of services referral. No other DC needs noted. Lynnell Catalan, RN 07/14/2014, 10:51 AM

## 2014-07-14 NOTE — Patient Outreach (Signed)
[place] [facility]) Care Management  07/14/2014  Harold Jennings [birth year] [id number]   Request from Marthenia Rolling, RN to assign [place] RN, assigned Dannielle Huh, RN.  Ronnell Freshwater. [place], [place] Management [place] Assistant Phone: [phone] Fax: [phone]

## 2014-07-15 ENCOUNTER — Other Ambulatory Visit: Payer: Self-pay | Admitting: *Deleted

## 2014-07-15 NOTE — Patient Outreach (Signed)
[place] [facility]) Care Management  07/15/2014  Harold Jennings 1932/10/15 [id number]    Assessment: Transition of Care call made. First attempt unsuccessful.   Called patient on his home telephone number and cell phone number but unable to reach him.  HIPPA compliant voice message left. Care management coordinator's contact information left on his voicemail.  Plan: Await for return call. Will follow-up at the end of the week if no return call.  Harold Jennings A. Randell Detter, BSN, RN-BC [place] Coordinator Cell: [phone]

## 2014-07-15 NOTE — Op Note (Signed)
Urology Op Note     Expand All Collapse All   Preoperative diagnosis:colovesical fistula  Postoperative diagnosis:same   Procedure:cystorrhaphy   Co-Surgeons: Lillette Boxer. Diona Fanti, M.D. ,Armandina Gemma, M.D  Anesthesia: Carin Hock.   Complications:none  Specimen(s):none  Drain(s):20 French Foley catheter, to straight drain  Indications:81 year old male with known colovesical fistula, complications from acute diverticulitis with associated supravesical abscess. The patient has been appropriately treated conservatively with antibiotics. He presents at this time, because of persistent symptoms from his colovesical fistula and diverticulitis, for colectomy to be performed by Dr. Harlow Asa as well as cystorrhaphy.    Technique and findings:the patient was properly identified in the holding area and brought to the operating room. He received preoperative IV antibiotics. He received general endotracheal anesthetic and was placed in the dorsolithotomy position on the operating table. Genitalia, perineum and abdomen were properly prepped and draped. Timeout was then performed.   A 20 French Foley catheter was thenplaced in the patient's bladder without difficulty. Air was liberated from the bladder at this point. The Foley was plugged. Dr. Harlow Asa performed a midline incision. He then exposed the superior part of the bladder, dissecting the colon and an associated abscess off of the dome of the bladder. Very small amount of purulent matter was obtained.Dr. Harlow Asa then mobilized the sigmoid colon in preparation for his eventual partial colectomy.  After the dome of the bladder was dissected, it was an approximate 1.5 cm defect in the dome of the bladder associated with the previously mentioned abscess. I filled the bladder with approximately 250 cc of saline. With the bladder distended like this, there was no significant leak of urine from the abscess cavity. No other extravasation was seen at the  dome of the bladder from a possible other fistula site. At this point, the bladder was drained. The dome of the bladder where the abscess had been was copiously irrigated with saline. I then decided to close the bladder defect. 2-0 Vicryl was placed to close the bladder defect-sutures were placed in a simple interrupted fashion. At this point, the cystorrhaphy was completed. Dr. Harlow Asa commenced with the remaining colectomy and anastomosis. The patient tolerated the urologic part of the procedure well, no complications resulted.  The bladder will be drained appropriately with a Foley catheter for at least a week.

## 2014-07-16 ENCOUNTER — Other Ambulatory Visit: Payer: Self-pay | Admitting: *Deleted

## 2014-07-16 NOTE — Patient Outreach (Signed)
[place] [facility]) Care Management  07/16/2014  CLEARNCE LEJA 1932-11-04 [id number]    Transition of Care call: 2nd attempt  Return call made to patient's missed call today but no answer. HIPPA compliant voice message left on patient's cell phone and home phone number.  Plan: Will continue to reach patient for transition of care call.  Artie Takayama A. Akita Maxim, BSN, RN-BC [place] Coordinator Cell: [phone]

## 2014-07-17 ENCOUNTER — Other Ambulatory Visit: Payer: Self-pay | Admitting: *Deleted

## 2014-07-17 NOTE — Patient Outreach (Signed)
[place] [facility]) Care Management  07/17/2014  Harold Jennings 12-16-1932 [id number]    Transition of care call made: 3rd attempt  Able to speak with patient very briefly. Care management coordinator introduced self. Patient's identity verified. Patient inquired if services will not be a duplication of home health which he currently has. Told patient that [facility] care management is another program with different services. Offered to discuss [facility] care management services  but patient refused and states, "I don't want to be talking on the phone right now, I am sick, so, I can't talk any longer". He further states, "Just come to my house and talk with me and my wife". Care management coordinator asked to speak to his wife and patient verbalized approval to speak with wife but he states it is not a good time to talk with her right now. Patient told care management coordinator to call his wife, leave a message and she'll get back with [place]. Message left on wife's contact number.    Plan: Await wife's return call. Will continue to reach patient's wife next week if no return call.   Damiel Barthold A. Nilson Tabora, BSN, RN-BC [place] Coordinator Cell: [phone]

## 2014-07-22 ENCOUNTER — Other Ambulatory Visit: Payer: Self-pay | Admitting: *Deleted

## 2014-07-22 NOTE — Patient Outreach (Signed)
[place] [facility]) Care Management  07/22/2014  Harold Jennings 1932-06-18 [id number]   Assessment: Called to follow-up transition of care call but patient states he is at the [place] office with his wife due to feeling "weak". Patient agreed for care management coordinator to call back tomorrow.  Plan: Will call back to follow-up tomorrow.   Daniya Aramburo A. Mignon Bechler, BSN, RN-BC [place] Coordinator Cell: [phone]

## 2014-07-23 ENCOUNTER — Other Ambulatory Visit: Payer: Self-pay | Admitting: *Deleted

## 2014-07-23 ENCOUNTER — Other Ambulatory Visit: Payer: Medicare Other | Admitting: *Deleted

## 2014-07-23 NOTE — Patient Outreach (Addendum)
[place] [facility]) Care Management  07/23/2014  Harold Jennings 05-09-1932 [id number]    Assessment: Transition of care follow-up call Called patient to follow-up transition of care but no answer. Cell phone voice mailbox is full and unable to leave a message. HIPPA compliant voice message left on his home telephone number as well as care management coordinator's contact information.  Plan: Await for return call. Will call back patient tomorrow if no response.   Derra Shartzer A. Aadhya Bustamante, BSN, RN-BC [place] Coordinator Cell: [phone]

## 2014-07-24 ENCOUNTER — Other Ambulatory Visit: Payer: Self-pay | Admitting: *Deleted

## 2014-07-24 NOTE — Progress Notes (Unsigned)
This encounter was created in error - please disregard.

## 2014-07-24 NOTE — Patient Outreach (Signed)
[facility] [facility]) Care Management  07/24/2014  Harold Jennings 09-Aug-1932 [id number]   Assessment: Transition of care call made. Received return call from patient's wife who left a message stating how thankful she was for the persistence in trying to reach her and patient. She states "hoping to connect soon". Called to follow-up transition of care call but no answer. HIPPA compliant voice message left as well as care management coordinator's contact number.  Patient's wife Harold Jennings) returned the call immediately. Spoke to patient's wife with patient's previous approval. Patient's identity confirmed with wife with two identifiers. Care management coordinator introduced self and explained purpose of call. Wife expressed eagerness to know about [facility] services in comparison to home health services currently received by patient. Discussed with wife regarding [facility] services and she agreed to home visit.   Ms. Harold Jennings reports that patient have had no falls since last hospitalization but still weak. Per wife, patient had 2 falls in the last month. Harold Jennings home health physical therapy and nurse are working with patient. He ambulates using walker with a seat and encouraged to walk around the house as tolerated. Wife reports patient having urine leakages/dribbles and some problem with bowel control. She mentioned requesting for home health aide to assist with patient's bathing and presently on waiting list for one. Ms. Harold Jennings also reported improved swelling to patient's ankles/lower extremities and no shortness of breath noted. She states Lasix is still on hold pending laboratory results to recheck kidney function- done on 7/6 on his follow-up appointment with primary care provider. She further states, "he is not retaining fluids that we can tell". Patient's weight is being checked by home health staff with recent readings of 180 pounds for two consecutive days per wife.   Wife reports patient had  radiation treatment for a rare skin cancer on his foot and also had recent colon and bladder surgery last May which he is still recovering from. She also reports of patient's weight lost of 88 pounds over a two year period and he continues to lose weight due to lack of appetite and patient stating "food does not taste good". He takes Boost for nutritional supplement. Wife states she is "trying to push protein in him" and trying anything that he can tolerate like pudding, ice cream and yogurt. Patient was started on Zoloft by primary care provider on 7/6 for depression per wife.  Reviewed medication list with wife. Patient manages his medications "pretty much himself with little assistance" as stated by wife. Ms. Harold Jennings reports providing transportation for husband to his doctor's appointments. Per wife, he is scheduled to see surgeon (Dr. Harlow Asa) on 7/13; follow-up appointment with primary care physician (Dr. Orland Mustard) on 7/25 and Cardiology on 8/1.  Discussed with patient's wife regarding fall prevention measures; reminded of scheduled doctor's appointments for patient. Encouraged to call care management coordinator or 24 hour nurse line as needed and contact information provided.    Plan: Initial home visit on 07/30/14.  Harold Jennings A. Harold Jennings, BSN, RN-BC [place] Coordinator Cell: [phone]

## 2014-07-30 ENCOUNTER — Other Ambulatory Visit: Payer: Self-pay | Admitting: *Deleted

## 2014-07-30 ENCOUNTER — Encounter: Payer: Self-pay | Admitting: *Deleted

## 2014-07-30 NOTE — Patient Outreach (Addendum)
[facility]) Care Management   07/30/2014  Harold Jennings 05-Oct-1932 [id number]  Harold Jennings is an 81 y.o. male  Subjective: "My appetite is a bit better" and "sleeping better" as verbalized by patient. Patient denies of pain or shortness of breath.  Objective: BP 110/78 mmHg  Pulse 98  Resp 20  Wt 177 lb (80.287 kg)  SpO2 99%   Review of Systems  Constitutional: Positive for weight loss.       Due to loss of appetite  HENT: Positive for hearing loss.        Slight hearing loss,not needing hearing aide  Eyes: Negative.   Respiratory: Negative.        Even and unlabored  Cardiovascular: Positive for leg swelling.       Non pitting, trace edema to bilateral lower extremity History of Afib - taking Xarelto and Metoprolol  Gastrointestinal:       Bowel sounds present ocassional bouts of diarrhea- on Loperamide prn  Genitourinary:       Urine leakage and dribbling per wife  Musculoskeletal: Positive for falls.       2 falls in the last month Reports over all weakness-- currently seen by [place] [facility]   Skin:       Dry, flaky bilateral lower extremities Dark,dry scab to left shin Whitish, dry scabbed area to top of right foot Reported irritation to buttocks with use of barrier cream protection   Psychiatric/Behavioral:       Per wife, recently started on Zoloft for depression    Physical Exam  Current Medications:   Current Outpatient Prescriptions  Medication Sig Dispense Refill  . diphenhydrAMINE (BENADRYL) 25 mg capsule Take 25 mg by mouth at bedtime.     Marland Kitchen doxazosin (CARDURA) 8 MG tablet Take 8 mg by mouth at bedtime.    Marland Kitchen ezetimibe (ZETIA) 10 MG tablet Take 10 mg by mouth every morning.     . finasteride (PROSCAR) 5 MG tablet Take 5 mg by mouth daily.    Marland Kitchen levothyroxine (SYNTHROID, LEVOTHROID) 100 MCG tablet Take 100 mcg by mouth daily.  12  . loperamide (IMODIUM) 1 MG/5ML solution Take 10 mLs (2 mg total) by mouth every 6 (six)  hours as needed for diarrhea or loose stools. 120 mL 0  . metoprolol succinate (TOPROL-XL) 50 MG 24 hr tablet Take 50 mg by mouth every morning. Take with or immediately following a meal.    . Rivaroxaban (XARELTO) 15 MG TABS tablet Take 1 tablet (15 mg total) by mouth daily with supper. 30 tablet 0  . saccharomyces boulardii (FLORASTOR) 250 MG capsule Take 250 mg by mouth every morning.     . sertraline (ZOLOFT) 50 MG tablet Take 50 mg by mouth daily.    Marland Kitchen HYDROcodone-acetaminophen (NORCO/VICODIN) 5-325 MG per tablet Take 1-2 tablets by mouth every 4 (four) hours as needed for moderate pain. (Patient not taking: Reported on 07/24/2014) 20 tablet 0  . Multiple Vitamin (MULTIVITAMIN) tablet Take 1 tablet by mouth every morning.     . [DISCONTINUED] hydrochlorothiazide 25 MG tablet Take 1 tablet (25 mg total) by mouth daily. 30 tablet 2   No current facility-administered medications for this visit.    Functional Status:   In your present state of health, do you have any difficulty performing the following activities: 07/24/2014 07/12/2014  Hearing? N N  Vision? N N  Difficulty concentrating or making decisions? Y N  Walking or climbing stairs? Tempie Donning  Dressing or bathing? Y Y  Doing errands, shopping? Tempie Donning  Preparing Food and eating ? Y -  Using the Toilet? N -  In the past six months, have you accidently leaked urine? Y -  Do you have problems with loss of bowel control? Y -  Managing your Medications? Y -  Managing your Finances? Y -  Housekeeping or managing your Housekeeping? Y -    Fall/Depression Screening:    PHQ 2/9 Scores 07/30/2014 07/24/2014  PHQ - 2 Score 4 6  PHQ- 9 Score 7 16    Assessment:   81 year old male who recently discharge from the hospital with principal problem of fall and also for further evaluation of worsening kidney function per wife. He has history of congestive heart failure, atrial fibrillation, colovesical fistula, hypertension, dyslipidemia and hypothyroidism.  Prior to this recent hospitalization, he also discharged from [facility] for rehabilitation after his surgery for sigmoid colectomy and bladder repair.   Wife informed this care management coordinator that lab works were rechecked for kidney function on 7/6 at patient's follow-up appointment post discharge with primary care provider. Results were called to them yesterday and are still elevated. Patient was instructed to continue to hold Lasix per wife. Referral to the kidney clinic (Dr. Sharyne Richters) was done and patient has a scheduled appointment on 07/31/14.  Patient was also seen by Dr. Harlow Asa (surgeon) on 7/13 and he was satisfied with the result- per patient's wife. Ms. Jodi Mourning states that patient also has another follow-up appointment with his primary care provider on 08/10/14 and with Cardiology on 08/17/14.  Ms. Jodi Mourning reports no fall episodes noted since last hospitalization but patient still remains weak. Patient reports participation with home health physical therapy and nurse. He moves around using walker with a seat and ambulates with physical therapy 2-3 times a week.  Per wife's report he is tolerating solid foods and appetite is a little bit better and he is sleeping better at present. He continues to drink Boost (high protein) for nutritional supplement. Wife states she continues to offer anything that he would eat ("prefers sweets" per wife) like pudding, ice cream, yogurt. Care coordinator suggested to mix Boost and ice cream to make a milkshake. Patient states "two scoops of ice cream, please".  Wife and patient believes that Zoloft (started recently) is "starting to work wonders".  Wife verbalize concern of needing a bath aide since [facility] has them currently on waiting list, and with patient having urine leakages and ocassional problem with bowel control, wife believes a bath aide is necessary to deliver a safe bathing assistance. Care coordinator mentioned to wife of available resources  for personal care services but she does not want to pay out of the pocket stating they have a lot to pay for patient's hospital bills. Will try to follow-up again with [place].  Patient denies need for social worker referral at this time.  Patient denies pain and shortness of breath and wife reports no worsening of swelling to bilateral lower extremities. Patient's weight on 7/13 at [place] office was 177 pounds. Encouraged to monitor weight and record in relation to doctor's order to continue to hold Lasix.   Medications reviewed with patient and wife. Care coordinator discuss with them the need to refill Xarelto and Levothyroxine as well as importance of taking his medications. Wife reports, patient managing his own medications with little assist from her. Wife continues to  provide transportation to patient for his doctors' appointments. Discussed follow-up appointment  with primary care physician (Dr. Orland Mustard) on 7/25 and Cardiology on 8/1.  Patient was provided Home Fall prevention checklist, provided Heart Failure packet and discussed heart failure zone tool and [facility] calendar/organizer was given to document weight and blood pressure readings.   Call to care management coordinator or 24 hour nurse line encouraged as needed.   [facility] CM Care Plan Problem One        Patient Outreach from 07/30/2014 in [place] Problem One  At risk for readmission   Care Plan for Problem One  Active   [facility] Long Term Goal (31-90 days)  patient will not be readmitted in the next 31 days   [facility] Long Term Goal Start Date  07/27/14   Interventions for Problem One Long Term Goal  initial home visit done,  reviewed medications,  discussed discharge instructions,  reviewed provider appointments,  confirmed initiation of home health services,  encourage active participation with home health,  provided Home fall prevention and Safety checklist    [facility] CM Short Term Goal #1 (0-30 days)  patient will  identify at least 3 signs of heart failure exacerbation and when to call the doctor in the next 2 weeks   [facility] CM Short Term Goal #1 Start Date  07/27/14   Interventions for Short Term Goal #1   Initial home visit done,  provide heart failure packet and discuss HF  zone tool,  encourage to monitor weight & record in relation to order of continuing to hold diuretics,  [facility] calendar provided    [facility] CM Short Term Goal #2 (0-30 days)  patient will verbalize at least 2 strategies of maintaining weight in the next 30 days   [facility] CM Short Term Goal #2 Start Date  07/27/14   Interventions for Short Term Goal #2  reinforced use of Boost supplement,  instructed to monitor weight and record,  encourage wife to continue serving foods rich in protein and calories that patient tolerates like: pudding, yogurt, ice cream, milkshake,  suggested to wife of mixing ice cream and Boost that patient was agreeable,  take medications as ordered   [facility] CM Short Term Goal #3 (0-30 days)  patient will take medications as ordered in the next 30 days   [facility] CM Short Term Goal #3 Start Date  07/27/14   Interventions for Short Tern Goal #3  review medication list,  discuss importance of filling prescriptions and taking medications as ordered,  reinforced new medication orders from provider                       Plan: Follow-up transition of care for next week 7/21.   Tameyah Koch A. Edvardo Honse, BSN, RN-BC [place] Management Coordinator Cell: [phone]

## 2014-07-31 ENCOUNTER — Encounter: Payer: Self-pay | Admitting: *Deleted

## 2014-07-31 ENCOUNTER — Other Ambulatory Visit: Payer: Self-pay | Admitting: Nephrology

## 2014-07-31 ENCOUNTER — Ambulatory Visit
Admission: RE | Admit: 2014-07-31 | Discharge: 2014-07-31 | Disposition: A | Discharge status: Home / Self Care | Payer: Medicare Other | Source: Ambulatory Visit | Attending: Nephrology | Admitting: Nephrology

## 2014-07-31 DIAGNOSIS — N179 Acute kidney failure, unspecified: Secondary | ICD-10-CM

## 2014-07-31 DIAGNOSIS — N321 Vesicointestinal fistula: Secondary | ICD-10-CM

## 2014-08-03 ENCOUNTER — Encounter: Payer: Self-pay | Admitting: *Deleted

## 2014-08-03 ENCOUNTER — Other Ambulatory Visit: Payer: Medicare Other

## 2014-08-03 NOTE — Patient Outreach (Signed)
[facility] [facility]) Care Management  08/03/2014  Harold Jennings 1932-06-16 [id number]   Received a message from [facility] office that patient had called to request change of scheduled appointment on 08/06/14. Called and spoke to patient's wife and states that patient has home health services  scheduled for that day. Appointment schedule changed to 08/04/14 per wife.   Delynn Olvera A. Manson Luckadoo, BSN, RN-BC [place] Management Coordinator Cell: [phone]

## 2014-08-04 ENCOUNTER — Encounter: Payer: Self-pay | Admitting: *Deleted

## 2014-08-04 ENCOUNTER — Other Ambulatory Visit: Payer: Medicare Other | Admitting: *Deleted

## 2014-08-04 NOTE — Patient Outreach (Signed)
[facility]) Care Management  08/04/2014  TYSHUN TUCKERMAN [birth year] [id number]     Assessment: Transition of care follow-up call  Patient states "doing pretty good", and also verbalized, "OK, but slow" when asked about his progress with home health physical therapy. He also reports "eating a little better", pertaining to his appetite.  According to wife, patient continues to give self a sponge bath only with what he can tolerate from his weakness/ fatigue. Patient has not showered since hospitalization and wife express concern of needing assistance (since his fall)  to give patient a shower safely since she herself is not able because of health restrictions. Attempted to contact [place] home health agency to follow-up on plan for a bath aide-- still awaiting for response. Discussed with wife about option for personal care services that can be arranged, but she states, "no room in the budget for anything else", "if Medicare will not cover, there's nothing else that can be done right now".  Wife is agreeable to discuss with home health nurse about updates on getting a bath aide since they were on "waiting list" as reported.   Patient reports working with physical therapy 2-3 times a week with slow progress and is "willing to try" a bath aide to assist with showers/ baths.  Wife states, patient was seen by Dr. Lorrene Reid at the kidney clinic last Friday (7/15) and laboratory works were done to recheck and follow up on his renal failure. Kidney clinic called back and notified them that results "looked better", although it showed bladder infection and was initially treated with antibiotics (Cipro) pending urine culture result.  Per wife, kidney clinic called them today to notify of antibiotics change from Cipro to Flagyl for 7 days . Wife states she is aware to pick up new antibiotics from the pharmacy to be started by patient.  Patient reports being able to tolerate more solid foods like:  cereals, macaroni and cheese, bacon sandwich, yogurt, ice cream but states no tomatoes, oranges or orange juice intake as instructed by kidney clinic. He reports taking Boost nutritional supplement "pretty regularly". Wife admits that patient's weight has not been monitored because his "daily needs and routine differs everyday".  Reemphasized the importance and benefits of weighing and recording since Lasix had been put on hold. Heart failure zone tool reviewed with patient, as well as heart failure exacerbation and when to call the doctor.  Patient informs care management coordinator that he manages his own medications and takes them as instructed. Emphasize to patient the adherence to kidney doctor's instructions to manage his acute kidney failure.  He denies any pain at this time and reports no recent fall episodes. Reminded patient and wife of upcoming doctors' appointments. Patient excuse himself during conversation and verbalize need to take nap to get some rest as he express feeling fatigued.  Encouraged patient and wife to call this care management coordinator or the 24 hour nurse line if any needs arise.    Plan: Transition of care follow-up call on 7/26. Will follow-up with [place] home health agency regarding plan for a bath aide.   Ahlia Lemanski A. Takyia Sindt, BSN, RN-BC [place] Management Coordinator Cell: [phone]

## 2014-08-04 NOTE — Patient Outreach (Signed)
[facility]) Care Management  08/04/2014  [name] 1932-07-09 [id number]  [place] [phone]) to follow-up the call placed this am. Unable to receive a reply from patient's nurse Nicki Reaper) to the message left this morning. Message left as well on nurse manager's Zigmund Daniel B.) voicemail. Care management coordinator's contact information provided.  This is to follow-up on plan of getting patient a bath aide per wife's request.  Plan: Await for return call. Will call to follow-up in 2-3 days if no response.   Wannetta Langland A. Koran Seabrook, BSN, RN-BC [place] Management Coordinator Cell: [phone]

## 2014-08-04 NOTE — Progress Notes (Signed)
This encounter was created in error - please disregard.

## 2014-08-06 ENCOUNTER — Encounter: Payer: Self-pay | Admitting: *Deleted

## 2014-08-06 ENCOUNTER — Ambulatory Visit: Payer: Medicare Other | Admitting: *Deleted

## 2014-08-06 NOTE — Patient Outreach (Signed)
[facility]) Care Management  08/06/2014  DEITRICK FERRERI 08-29-1932 [id number]   Spoke with [facility] home [facility] nurse manager Zigmund Daniel) regarding patient's wife requesting for a bath aide.  [facility] nurse manager states she was perplexed that they're needing a bath aide since it was not noted by her staff who works with patient that he needed one. [facility] nurse manager made aware of discussion with  wife and patient regarding this matter. Care management coordinator was informed that an occupational therapist will be sent today or tomorrow to evaluate patient needs.   Plan: Care coordination with home health agency for updates regarding this matter.   Hamed Debella A. Brodyn Depuy, BSN, RN-BC [place] Management Coordinator Cell: [phone]

## 2014-08-10 ENCOUNTER — Other Ambulatory Visit: Payer: Self-pay | Admitting: *Deleted

## 2014-08-10 NOTE — Patient Outreach (Signed)
[facility] [place] [facility]) Care Management  08/10/2014  ROC STREETT 1932-02-07 [id number]   Assessment: Care coordination call Call to schedule home visit for tomorrow instead of telephone call as previously scheduled but no answer. Call place to patient's cell phone as well but voice box is already full. HIPAA compliant message left and care management coordinator's contact information provided.  Plan: Await for return call. Will call patient back if no response received towards the end of the day.  Katarina Riebe A. Jj Enyeart, BSN, RN-BC [place] Management Coordinator Cell: [phone]

## 2014-08-10 NOTE — Patient Outreach (Signed)
[facility]) Care Management  08/10/2014  RAFAL ARCHULETA 1932-05-14 [id number]   Assessment: Follow-up call  Call made to follow-up the phone call placed this morning in attempt to change tomorrow's schedule to home visit instead of transition of care call but still no answer. Cell phone voice message box is full. HIPAA compliant message left.  Plan: Will proceed with transition of care call as scheduled for tomorrow. Will plan for home visit with patient and wife if agreeable.   Ady Heimann A. Hanadi Stanly, BSN, RN-BC [place] Management Coordinator Cell: [phone]

## 2014-08-11 ENCOUNTER — Ambulatory Visit: Payer: Self-pay | Admitting: *Deleted

## 2014-08-11 ENCOUNTER — Encounter: Payer: Self-pay | Admitting: *Deleted

## 2014-08-11 ENCOUNTER — Ambulatory Visit: Payer: Medicare Other | Admitting: *Deleted

## 2014-08-11 NOTE — Patient Outreach (Signed)
[facility]) Care Management  08/11/2014  EDMUND HOLCOMB 1932/12/27 [id number]   Received message (e-mail) from [facility] office that patient called to cancel transition of care call scheduled today. Rescheduled appointment for transition of care call to 08/13/14.  Plan: Will call patient on rescheduled date this week.  Zo Loudon A. Kamala Kolton, BSN, RN-BC [place] Management Coordinator Cell: [phone]

## 2014-08-13 ENCOUNTER — Encounter: Payer: Self-pay | Admitting: *Deleted

## 2014-08-13 ENCOUNTER — Other Ambulatory Visit: Payer: Self-pay | Admitting: *Deleted

## 2014-08-13 NOTE — Patient Outreach (Signed)
[facility]) Care Management  08/13/2014  Harold Jennings 1932/10/01 [id number]   Call placed to [place] home health agency Harold Jennings- clinical nurse manager team 4) to get update on plan to send an occupational therapist to patient's house and evaluate his need for a bath aide. Unable to reach [place]. HIPAA compliant message left with name and contact number.  Plan: Await for return call. Will get update from patient or wife on rescheduled transition of care call this afternoon.   Harold Jennings A. Harold Jennings, BSN, RN-BC [place] Management Coordinator Cell: [phone]

## 2014-08-13 NOTE — Patient Outreach (Signed)
[facility]) Care Management  08/13/2014  Harold Jennings Jul 15, 1932 [id number]   Assessment: Transition of care call follow-up (rescheduled)  Call and spoke to patient who states he is "feeling better" and "doing pretty well". He states that his appetite is "getting little better" and tolerated a bowl of cereal for lunch. He also reports "sleeping pretty good". He denies of pain or any fall episode. According to patient, irritation to buttocks has already  improved with use of protective barrier cream.  Per patient, he continues to take his antibiotics as instructed and has few more days left but not able to recall name of antibiotics that replaced Cipro. No other medication changes made since last visit as stated by patient.  Patient reports, he has a scheduled appointment to see his cardiologist on Monday 08/17/14. Transportation provided by wife.  He reports that occupational therapist came to evaluate him and was told he is in pretty good shape and that occupational therapist does not need to come back unless patient is needing more help. Per patient's report, his wife was educated/ instructed by occupational therapist to give patient a safe shower/ bath. He expressed his gratitude for having the occupational therapist come by to help them out.  When offered to schedule another home visit to assess his needs further, patient was hesitant. Care management coordinator mentioned to him of a possible referral to [facility] social worker for community resources and possibility for Medicaid application since his wife mentioned about financial crunch on the last visit. Patient verbalized again that he still believes that the services we are providing is "seemingly just a duplication" of what home health is rendering to him, in spite the explanation given on [facility] services in comparison to home health. He states " I don't mean to be rude but I think, I don't need anymore right now", "I feel like  everything is covered". Patient was also offered telephonic care management or health coach services for further disease management but he said, "thank you for the help".  Care coordinator asked to speak to his wife but he states that she is not available to speak with at this moment. Patient agreed for care coordinator to talk with wife and to tell his wife to give a call back when available. Patient was rushing to get off the phone.  Patient is aware of care coordinator's contact information and was provided the 24 hour nurse line number if needed.   Plan: Await for wife to call back. If unable to receive call from wife, will schedule next outreach call on 08/17/14.   Atul Delucia A. Kaileigh Viswanathan, BSN, RN-BC [place] Management Coordinator Cell: [phone]

## 2014-08-14 NOTE — Patient Outreach (Signed)
[facility]) Care Management  08/13/2014  Harold Jennings 12/07/1932 [id number]   Assessment: Transition of care call follow-up (rescheduled)  Call and spoke to patient who states he is "feeling better" and "doing pretty well". He states that his appetite is "getting little better" and tolerated a bowl of cereal for lunch. He also reports "sleeping pretty good". He denies of pain or any fall episode. According to patient, irritation to buttocks has already improved with use of protective barrier cream.  Per patient, he continues to take his antibiotics as instructed and has few more days left but not able to recall name of antibiotics that replaced Cipro. No other medication changes made since last visit as stated by patient.  Patient reports, he has a scheduled appointment to see his cardiologist on Monday 08/17/14. Transportation provided by wife.  He reports that occupational therapist came to evaluate him and was told he is in pretty good shape and that occupational therapist does not need to come back unless patient is needing more help. Per patient's report, his wife was educated/ instructed by occupational therapist to give patient a safe shower/ bath. He expressed his gratitude for having the occupational therapist come by to help them out.  When offered to schedule another home visit to assess his needs further, patient was hesitant. Care management coordinator mentioned to him of a possible referral to [name] social worker for community resources and possibility for Medicaid application since his wife mentioned about financial crunch on the last visit. Patient verbalized again that he still believes that the services we are providing is "seemingly just a duplication" of what home health is rendering to him, in spite the explanation given on [facility] services in comparison to home health. He states " I don't mean to be rude but I think, I don't need anymore right now", "I feel  like everything is covered". Patient was also offered telephonic care management or health coach services for further disease management but he said, "thank you for the help".  Care coordinator asked to speak to his wife but he states that she is not available to speak with at this moment. Patient agreed for care coordinator to talk with wife and to tell his wife to give a call back when available. Patient was rushing to get off the phone. Patient is aware of care coordinator's contact information and was provided the 24 hour nurse line number if needed.   Plan: Await for wife to call back. If unable to receive call from wife, will schedule next outreach call on 08/17/14.  Nahome Bublitz A. Ulani Degrasse, BSN, RN-BC [place] Management Coordinator Cell: [phone]

## 2014-08-16 NOTE — Progress Notes (Signed)
Cardiology Office Note   Date:  08/17/2014   ID:  Harold Jennings, DOB 01/20/1932, MRN [id number]  Patient Care Team: London Pepper, MD as PCP - General (Family Medicine) Sherren Mocha, MD as Consulting Physician (Cardiology) Franchot Gallo, MD as Consulting Physician (Urology) Armandina Gemma, MD as Consulting Physician (General Surgery) Inda Castle, MD as Consulting Physician (Gastroenterology) Diamantina Monks, RN as [place] Management Jamal Maes, MD as Consulting Physician (Nephrology)    Chief Complaint  Patient presents with  . Atrial Fibrillation  . Coronary Artery Disease  . Cardiomyopathy     History of Present Illness: Harold Jennings is a 81 y.o. male with a hx of chronic AFib, CAD with known CTO of the RCA, ischemic CM with mild LV dysfunction, Merkel Cell CA, prior LGI bleeding in the setting of diverticulitis and diverticular abscess and colovesical fistula. He is anticoagulated with Xarelto.  Last seen by Dr. Sherren Mocha in 4/16.   He underwent sigmoid colectomy and bladder repair by Dr. Harlow Asa and Dr. Diona Fanti in June 2016.  He was DC 06/23/14.  He was readmitted 6/26-6/28 with ARF in the setting of recent falls.  He was hydrated with IVFs.  Lasix was placed on hold.  Peak creatinine 2.67 (2.08 at DC).  He returns for FU.    He has established with Dr. Lorrene Reid.  Sees her again today.  Here today with his wife.  Denies chest pain, syncope, orthopnea, PND.  Denies significant dyspnea. Walks with a walker.  He has no appetite.  Has been losing weight.  Denies any worsening edema since he has been off of Lasix.  No bleeding issues.  He had a few falls before he was admitted in June.  No further falls.     Studies/Reports Reviewed Today:  Echo 08/2013 - Mild concentric hypertrophy. EF 40% to 45%. Wall motion was normal  - Aortic valve: There wasmild stenosis. There was mild regurgitation. - Mitral valve: There was mild to moderate  regurgitation directedcentrally. - Left atrium: The atrium was moderately to severely dilated. - Right ventricle: The cavity size was mildly dilated. Systolic function was normal. - Right atrium: The atrium was mildly dilated. - Tricuspid valve: There was mild regurgitation. - Pulmonic valve: There was mild regurgitation. - Pulmonary arteries: Systolic pressure was within the normalrange. PA peak pressure: 31 mm Hg (S). - Pericardium, extracardiac: There was no pericardial effusion. Impressions:- Compared to the prior study in 2013 the left ventricle is nowmildly dilated with mildly impaired systolic function, [id number]%.Mild to moderate mitral regurgitation. Severel left atrialdilatation.Mild aortic stenosis and insufficiency.Normal RVSP.  Myoview 06/2008 Overall Impression: Mild fixed inferior perfusion defect with hypokinesis in the basal inferior wall suggests old infarction. No evidence for ischemia.    Past Medical History  Diagnosis Date  . Dyslipidemia   . HTN (hypertension)   . Obesity   . Increased liver enzymes     felt to be a fatty liver per ultrasound in 2011  . Dyspnea   . Ischemic cardiomyopathy   . Spinal stenosis   . Sleep apnea     bipap   . CAD (coronary artery disease)     Silent MI sometime prior to 2000. S/P cath in 2000 showing a total mid RCA with collaterals from the LCX. EF is 40 to50%; Last Myoview in 2010 showing inferior scar without ischemia. EF was 52%.  Marland Kitchen Dysrhythmia     atrial fib   . Edema  lower extremity   . Cancer     skin cancer   . Anemia   . Diverticular disease dec 2015  . Umbilical hernia   . Myocardial infarction   . CHF (congestive heart failure)     Past Surgical History  Procedure Laterality Date  . Total hip arthroplasty    . Eye surgery    . Lower back surgery      spinal stenosis  . Shoulder surgery    . Cardioversion N/A 06/06/2012    Procedure: CARDIOVERSION;  Surgeon: Thayer Headings, MD;  Location: [facility];  Service: Cardiovascular;  Laterality: N/A;  . Cardiac catheterization    . Partial colectomy N/A 06/12/2014    Procedure: SIGMOID COLECTOMY AND TAKEDOWN OF COLOVESICAL FISTULA ;  Surgeon: Armandina Gemma, MD;  [facility];  Service: General;  Laterality: N/A;  . Umbilical hernia repair N/A 06/12/2014    Procedure: INCARCERATED UMBILICAL HERNIA REPAIR  ;  Surgeon: Armandina Gemma, MD;  [facility];  Service: General;  Laterality: N/A;  . Cystostomy N/A 06/12/2014    Procedure: Jasmine December;  Surgeon: Franchot Gallo, MD;  [facility];  Service: Urology;  Laterality: N/A;     Current Outpatient Prescriptions  Medication Sig Dispense Refill  . diphenhydrAMINE (BENADRYL) 25 mg capsule Take 25 mg by mouth at bedtime.     Marland Kitchen doxazosin (CARDURA) 8 MG tablet Take 8 mg by mouth at bedtime.    Marland Kitchen ezetimibe (ZETIA) 10 MG tablet Take 10 mg by mouth every morning.     . finasteride (PROSCAR) 5 MG tablet Take 5 mg by mouth daily.    Marland Kitchen HYDROcodone-acetaminophen (NORCO/VICODIN) 5-325 MG per tablet Take 1-2 tablets by mouth every 4 (four) hours as needed for moderate pain. (Patient not taking: Reported on 07/24/2014) 20 tablet 0  . levothyroxine (SYNTHROID, LEVOTHROID) 100 MCG tablet Take 100 mcg by mouth daily.  12  . loperamide (IMODIUM) 1 MG/5ML solution Take 10 mLs (2 mg total) by mouth every 6 (six) hours as needed for diarrhea or loose stools. 120 mL 0  . metoprolol succinate (TOPROL-XL) 50 MG 24 hr tablet Take 50 mg by mouth every morning. Take with or immediately following a meal.    . Multiple Vitamin (MULTIVITAMIN) tablet Take 1 tablet by mouth every morning.     . Rivaroxaban (XARELTO) 15 MG TABS tablet Take 1 tablet (15 mg total) by mouth daily with supper. 30 tablet 0  . saccharomyces boulardii (FLORASTOR) 250 MG capsule Take 250 mg by mouth every morning.     . sertraline (ZOLOFT) 50 MG tablet Take 50 mg by mouth daily.    . [DISCONTINUED] hydrochlorothiazide 25 MG tablet Take 1  tablet (25 mg total) by mouth daily. 30 tablet 2   No current facility-administered medications for this visit.    Allergies:   Review of patient's allergies indicates no known allergies.    Social History:  The patient  reports that he has never smoked. He has never used smokeless tobacco. He reports that he drinks alcohol. He reports that he does not use illicit drugs.   Family History:  The patient's family history includes Diabetes in his father; Drug abuse in his mother; Heart attack in his father; Hypertension in his brother, brother, brother, father, and sister; Stroke in his father.    ROS:   Please see the history of present illness.   Review of Systems  Constitution: Positive for decreased appetite, malaise/fatigue and weight loss.  HENT:  Positive for hearing loss.   Cardiovascular: Positive for irregular heartbeat.  Gastrointestinal: Positive for diarrhea.  Neurological: Positive for loss of balance.  All other systems reviewed and are negative.     PHYSICAL EXAM: VS:  BP 113/66 mmHg  Pulse 80  Ht 5\' 5"  (1.651 m)  Wt 174 lb (78.926 kg)  BMI 28.96 kg/m2    Wt Readings from Last 3 Encounters:  08/17/14 174 lb (78.926 kg)  07/30/14 177 lb (80.287 kg)  07/14/14 191 lb (86.637 kg)     GEN: Well nourished, well developed, in no acute distress HEENT: normal Neck: no JVD,   no masses Cardiac:  Normal S1/S2, irreg irreg rhythm; no murmur ,  no rubs or gallops, trace - 1+ bilat LE edema   Respiratory:  clear to auscultation bilaterally, no wheezing, rhonchi or rales. GI: soft, nontender, nondistended, + BS MS: no deformity or atrophy Skin: warm and dry  Neuro:  CNs II-XII intact, Strength and sensation are intact Psych: Normal affect   EKG:  EKG is not ordered today.  It demonstrates:   n/a   Recent Labs: 07/12/2014: ALT 20; Magnesium 2.1; TSH 3.200 07/13/2014: Hemoglobin 11.8*; Platelets 307 07/14/2014: BUN 37*; Creatinine, Ser 2.08*; Potassium 3.9; Sodium 134*      Lipid Panel    Component Value Date/Time   CHOL 183 01/04/2009 1009   TRIG 116.0 01/04/2009 1009   HDL 39.80 01/04/2009 1009   CHOLHDL 5 01/04/2009 1009   VLDL 23.2 01/04/2009 1009   LDLCALC 120* 01/04/2009 1009   LDLDIRECT 139.7 01/27/2008 1153      ASSESSMENT AND PLAN:  Chronic atrial fibrillation:  Rate controlled.  Continue current dose of Toprol.  Continue Xarelto.  He is no longer falling.  Coronary artery disease involving native coronary artery of native heart without angina pectoris:  No angina.  Continue beta-blocker, Zetia.  Ischemic cardiomyopathy:  Continue beta-blocker.  Recent creatinine tenuous and ACE inhibitor would not be advisable at this point.  He has remained stable off of Lasix.   Essential hypertension:  BP ok today.  It has been borderline.  I have asked him to check with his Urologist to see if his Doxazosin can be reduced.   Dyslipidemia:  Statin intol.  Continue Zetia.    Chronic Kidney Disease:  Recent Creatinine 1.87 last month.  His creatinine clearance is 31.  He is on the correct dose of Xarelto at 15 mg QD.      Medication Changes: Current medicines are reviewed at length with the patient today.  Concerns regarding medicines are as outlined above.  The following changes have been made:   Discontinued Medications   No medications on file   Modified Medications   No medications on file   New Prescriptions   No medications on file    Labs/ tests ordered today include:   No orders of the defined types were placed in this encounter.     Disposition:   FU with Dr. Sherren Mocha 3 months.    Signed, Versie Starks, MHS 08/17/2014 1:55 PM    [facility] [place], [place], [place]  [address] Phone: [phone]; Fax: [phone]

## 2014-08-17 ENCOUNTER — Ambulatory Visit (INDEPENDENT_AMBULATORY_CARE_PROVIDER_SITE_OTHER): Payer: Medicare Other | Admitting: Physician Assistant

## 2014-08-17 ENCOUNTER — Encounter: Payer: Self-pay | Admitting: Physician Assistant

## 2014-08-17 ENCOUNTER — Other Ambulatory Visit: Payer: Self-pay | Admitting: *Deleted

## 2014-08-17 VITALS — BP 113/66 | HR 80 | Ht 65.0 in | Wt 174.0 lb

## 2014-08-17 DIAGNOSIS — I482 Chronic atrial fibrillation, unspecified: Secondary | ICD-10-CM

## 2014-08-17 DIAGNOSIS — I2510 Atherosclerotic heart disease of native coronary artery without angina pectoris: Secondary | ICD-10-CM | POA: Diagnosis not present

## 2014-08-17 DIAGNOSIS — I10 Essential (primary) hypertension: Secondary | ICD-10-CM

## 2014-08-17 DIAGNOSIS — E785 Hyperlipidemia, unspecified: Secondary | ICD-10-CM

## 2014-08-17 DIAGNOSIS — I255 Ischemic cardiomyopathy: Secondary | ICD-10-CM | POA: Diagnosis not present

## 2014-08-17 DIAGNOSIS — N183 Chronic kidney disease, stage 3 (moderate): Secondary | ICD-10-CM

## 2014-08-17 NOTE — Patient Instructions (Signed)
Medication Instructions:  No changes. Talk to your Urologist to see if you can either decrease the dose of Cardura (Doxazosin) or change to a different medication.  Labwork: None today. I will ask Dr. Lorrene Reid to send [place] your most recent labs.  Testing/Procedures: None   Follow-Up: Dr. Sherren Mocha in 3 months.   Any Other Special Instructions Will Be Listed Below (If Applicable).

## 2014-08-17 NOTE — Patient Outreach (Signed)
[facility]) Care Management  08/17/2014  JAISE MOSER 12-15-1932 [id number]   Assessment: Transition of Care follow-up Call place to speak to wife or patient but unable to reach both. Case management coordinator left HIPAA compliant message with name and contact number.   Plan: Will schedule for next transition of care call follow-up tomorrow 08/18/14.  Kyia Rhude A. Cruz Devilla, BSN, RN-BC [place] Management Coordinator Cell: [phone]

## 2014-08-18 ENCOUNTER — Other Ambulatory Visit: Payer: Self-pay | Admitting: *Deleted

## 2014-08-18 NOTE — Patient Outreach (Signed)
[facility]) Care Management  08/18/2014  Harold Jennings 05/19/1932 [id number]   Assessment: Transition of care call follow-up (second attempt) Call made to follow-up transition of care call but unable to reach wife and patient. HIPAA compliant voice message left with care management coordinator's name and contact information.  Plan: Will call to follow-up transition of care tomorrow 08/19/2014.  Harold Jennings, BSN, RN-BC [place] Management Coordinator Cell: [phone]

## 2014-08-19 ENCOUNTER — Other Ambulatory Visit: Payer: Self-pay | Admitting: *Deleted

## 2014-08-19 NOTE — Patient Outreach (Signed)
[facility] Harold Jennings) Care Management  08/19/2014  Harold Jennings 08/21/1932 [id number]   Assessment: Transition of care follow-up call - third attempt  Unable to receive any return call from patient or wife to messages left. Call placed to wife and patient but unable to reach both. HIPAA compliant voice message left with  care management coordinator's name and contact number.  Plan: Will send Patient Outreach Letter. Will send  through mail - EMMI educational materials and  website links for other kidney informations:                  [email or web address]                  [email or web address]   Harold Jennings, BSN, RN-BC [place] Management Coordinator Cell: [phone]

## 2014-08-20 ENCOUNTER — Encounter: Payer: Self-pay | Admitting: *Deleted

## 2014-09-04 ENCOUNTER — Other Ambulatory Visit: Payer: Self-pay | Admitting: *Deleted

## 2014-09-04 NOTE — Patient Outreach (Signed)
[facility]) Care Management  09/04/2014  Harold Jennings 1932-10-30 [id number]   Assessment: Care management coordinator was unable to receive any call back from patient and wife. No response made to Patient Outreach Letter sent as well.  Plan: Will close case. Will notify primary care provider of case closure.   Trinita Devlin A. [place], BSN, RN-BC [place] Management Coordinator Cell: [phone]

## 2014-09-15 NOTE — Patient Outreach (Signed)
[facility]) Care Management  09/15/2014  KHIRY PASQUARIELLO 01-21-1932 [id number]   Notification from Dannielle Huh, RN to close case due to patient withdrew from program.  Thanks, Ronnell Freshwater. [place], [place] Assistant Phone: [phone] Fax: [phone]

## 2014-09-26 ENCOUNTER — Other Ambulatory Visit: Payer: Self-pay | Admitting: Cardiovascular Disease

## 2014-11-20 ENCOUNTER — Ambulatory Visit (INDEPENDENT_AMBULATORY_CARE_PROVIDER_SITE_OTHER): Payer: Medicare Other | Admitting: Cardiovascular Disease

## 2014-11-20 ENCOUNTER — Encounter: Payer: Self-pay | Admitting: Cardiovascular Disease

## 2014-11-20 VITALS — BP 100/60 | HR 63 | Ht 65.0 in | Wt 178.0 lb

## 2014-11-20 DIAGNOSIS — I482 Chronic atrial fibrillation, unspecified: Secondary | ICD-10-CM

## 2014-11-20 DIAGNOSIS — I255 Ischemic cardiomyopathy: Secondary | ICD-10-CM | POA: Diagnosis not present

## 2014-11-20 NOTE — Patient Instructions (Signed)

## 2014-11-22 ENCOUNTER — Encounter: Payer: Self-pay | Admitting: Cardiovascular Disease

## 2014-11-22 NOTE — Progress Notes (Signed)
Cardiology Office Note Date:  11/22/2014   ID:  Harold Jennings, DOB [birth year], MRN [id number]  PCP:  London Pepper, MD  Cardiologist:  Sherren Mocha, MD    Chief Complaint  Patient presents with  . Fatigue   History of Present Illness: DAYMION NAZAIRE is a 81 y.o. male who presents for follow-up evaluation. He has been followed for CAD and atrial fibrillation, anticoagulated with Xarelto (dose reduced for renal function). He underwent sigmoid colectomy and bladder repair by Dr. Harlow Asa and Dr. Diona Fanti in June 2016.He has lost a great deal of weight and become increasingly weak. He feels like all of this is beginning to stabilize now.   No cardiac issues have come up over the last several months. He denies bleeding problems on anticoagulation. No palpitations, chest pain, or shortness of breath. No leg swelling.    Past Medical History  Diagnosis Date  . Dyslipidemia   . HTN (hypertension)   . Obesity   . Increased liver enzymes     felt to be a fatty liver per ultrasound in 2011  . Dyspnea   . Ischemic cardiomyopathy   . Spinal stenosis   . Sleep apnea     bipap   . CAD (coronary artery disease)     Silent MI sometime prior to 2000. S/P cath in 2000 showing a total mid RCA with collaterals from the LCX. EF is 40 to50%; Last Myoview in 2010 showing inferior scar without ischemia. EF was 52%.  Marland Kitchen Dysrhythmia     atrial fib   . Edema     lower extremity   . Cancer ([place])     skin cancer   . Anemia   . Diverticular disease dec 2015  . Umbilical hernia   . Myocardial infarction ([place])   . CHF (congestive heart failure) [facility])     Past Surgical History  Procedure Laterality Date  . Total hip arthroplasty    . Eye surgery    . Lower back surgery      spinal stenosis  . Shoulder surgery    . Cardioversion N/A 06/06/2012    Procedure: CARDIOVERSION;  Surgeon: Thayer Headings, MD;  Location: [facility];  Service: Cardiovascular;  Laterality: N/A;  . Cardiac  catheterization    . Partial colectomy N/A 06/12/2014    Procedure: SIGMOID COLECTOMY AND TAKEDOWN OF COLOVESICAL FISTULA ;  Surgeon: Armandina Gemma, MD;  [facility];  Service: General;  Laterality: N/A;  . Umbilical hernia repair N/A 06/12/2014    Procedure: INCARCERATED UMBILICAL HERNIA REPAIR  ;  Surgeon: Armandina Gemma, MD;  [facility];  Service: General;  Laterality: N/A;  . Cystostomy N/A 06/12/2014    Procedure: Jasmine December;  Surgeon: Franchot Gallo, MD;  [facility];  Service: Urology;  Laterality: N/A;    Current Outpatient Prescriptions  Medication Sig Dispense Refill  . diphenhydrAMINE (BENADRYL) 25 mg capsule Take 25 mg by mouth at bedtime.     Marland Kitchen doxazosin (CARDURA) 8 MG tablet Take 8 mg by mouth at bedtime.    Marland Kitchen ezetimibe (ZETIA) 10 MG tablet Take 10 mg by mouth every morning.     . finasteride (PROSCAR) 5 MG tablet Take 5 mg by mouth daily.    Marland Kitchen levothyroxine (SYNTHROID, LEVOTHROID) 100 MCG tablet Take 100 mcg by mouth daily.  12  . loperamide (IMODIUM) 1 MG/5ML solution Take 10 mLs (2 mg total) by mouth every 6 (six) hours as needed for diarrhea or loose stools. [place]  mL 0  . metoprolol succinate (TOPROL-XL) 50 MG 24 hr tablet Take 50 mg by mouth every morning. Take with or immediately following a meal.    . metoprolol succinate (TOPROL-XL) 50 MG 24 hr tablet TAKE 1 TABLET BY MOUTH EVERY MORNING & 1/2 TABLET EVERY EVENING WITH OR IMMEDIATELY FOLLOWING A MEAL 45 tablet 4  . nitrofurantoin, macrocrystal-monohydrate, (MACROBID) 100 MG capsule Take 100 mg by mouth every 12 (twelve) hours. TAKE ONE TAB    . Rivaroxaban (XARELTO) 15 MG TABS tablet Take 1 tablet (15 mg total) by mouth daily with supper. 30 tablet 0  . saccharomyces boulardii (FLORASTOR) 250 MG capsule Take 250 mg by mouth every morning.     . sertraline (ZOLOFT) 50 MG tablet Take 50 mg by mouth daily.    . silodosin (RAPAFLO) 8 MG CAPS capsule Take 8 mg by mouth daily with breakfast.    . [DISCONTINUED]  hydrochlorothiazide 25 MG tablet Take 1 tablet (25 mg total) by mouth daily. 30 tablet 2   No current facility-administered medications for this visit.    Allergies:   Review of patient's allergies indicates no known allergies.   Social History:  The patient  reports that he has never smoked. He has never used smokeless tobacco. He reports that he drinks alcohol. He reports that he does not use illicit drugs.   Family History:  The patient's  family history includes Diabetes in his father; Drug abuse in his mother; Heart attack in his father; Hypertension in his brother, brother, brother, father, and sister; Stroke in his father.    ROS:  Please see the history of present illness.  Otherwise, review of systems is positive for diarrhea.  All other systems are reviewed and negative.    PHYSICAL EXAM: VS:  BP 100/60 mmHg  Pulse 63  Ht 5\' 5"  (1.651 m)  Wt 178 lb (80.74 kg)  BMI 29.62 kg/m2  SpO2 98% , BMI Body mass index is 29.62 kg/(m^2). GEN: Chronically ill-appearing, in no acute distress HEENT: normal Neck: no JVD, no masses. No carotid bruits Cardiac: irregularly irregular without murmur or gallop           Respiratory:  clear to auscultation bilaterally, normal work of breathing GI: soft, nontender, nondistended, + BS MS: no deformity or atrophy Ext: no pretibial edema,  Skin: warm and dry, scaling of both legs noted Neuro:  Strength and sensation are intact Psych: euthymic mood, full affect  EKG:  EKG is not ordered today.  Recent Labs: 07/12/2014: ALT 20; Magnesium 2.1; TSH 3.200 07/13/2014: Hemoglobin 11.8*; Platelets 307 07/14/2014: BUN 37*; Creatinine, Ser 2.08*; Potassium 3.9; Sodium 134*   Lipid Panel     Component Value Date/Time   CHOL 183 01/04/2009 1009   TRIG 116.0 01/04/2009 1009   HDL 39.80 01/04/2009 1009   CHOLHDL 5 01/04/2009 1009   VLDL 23.2 01/04/2009 1009   LDLCALC 120* 01/04/2009 1009   LDLDIRECT 139.7 01/27/2008 1153      Wt Readings from  Last 3 Encounters:  11/20/14 178 lb (80.74 kg)  08/17/14 174 lb (78.926 kg)  07/30/14 177 lb (80.287 kg)    ASSESSMENT AND PLAN: Chronic atrial fibrillation: Rate controlled. Continue current dose of Toprol. Continue Xarelto 15 mg in setting of renal dysfunction.  Coronary artery disease involving native coronary artery of native heart without angina pectoris: No angina. Continue beta-blocker, Zetia. Statin intolerant.  Ischemic cardiomyopathy: Continue beta-blocker. not a candidate for ACE/ARB with poor nutritional status and high risk for  AKI  Essential hypertension: BP is low with poor nutritional status, weight loss.   Dyslipidemia: Statin intol. Continue Zetia.   Chronic Kidney Disease III: following with nephrology  Current medicines are reviewed with the patient today.  The patient does not have concerns regarding medicines.  Labs/ tests ordered today include:  No orders of the defined types were placed in this encounter.    Disposition:   FU 6 months  Signed, Sherren Mocha, MD  11/22/2014 10:10 PM    [facility] [place], [place], [place]  [address] Phone: [phone]; Fax: [phone]

## 2015-02-08 ENCOUNTER — Other Ambulatory Visit: Payer: Self-pay | Admitting: Cardiovascular Disease

## 2015-02-17 ENCOUNTER — Encounter (HOSPITAL_COMMUNITY): Payer: Self-pay

## 2015-02-17 ENCOUNTER — Emergency Department (HOSPITAL_COMMUNITY): Payer: Medicare Other

## 2015-02-17 ENCOUNTER — Inpatient Hospital Stay (HOSPITAL_COMMUNITY): Payer: Medicare Other

## 2015-02-17 ENCOUNTER — Inpatient Hospital Stay (HOSPITAL_COMMUNITY)
Admission: EM | Admit: 2015-02-17 | Discharge: 2015-02-25 | DRG: 193 | Disposition: A | Discharge status: Skilled Nursing Facility | Payer: Medicare Other | Attending: Internal Medicine | Admitting: Internal Medicine

## 2015-02-17 DIAGNOSIS — Z79899 Other long term (current) drug therapy: Secondary | ICD-10-CM | POA: Diagnosis not present

## 2015-02-17 DIAGNOSIS — I509 Heart failure, unspecified: Secondary | ICD-10-CM | POA: Diagnosis not present

## 2015-02-17 DIAGNOSIS — Z96649 Presence of unspecified artificial hip joint: Secondary | ICD-10-CM | POA: Diagnosis present

## 2015-02-17 DIAGNOSIS — I130 Hypertensive heart and chronic kidney disease with heart failure and stage 1 through stage 4 chronic kidney disease, or unspecified chronic kidney disease: Secondary | ICD-10-CM | POA: Diagnosis present

## 2015-02-17 DIAGNOSIS — G451 Carotid artery syndrome (hemispheric): Secondary | ICD-10-CM | POA: Diagnosis not present

## 2015-02-17 DIAGNOSIS — I255 Ischemic cardiomyopathy: Secondary | ICD-10-CM | POA: Diagnosis present

## 2015-02-17 DIAGNOSIS — G9341 Metabolic encephalopathy: Secondary | ICD-10-CM | POA: Diagnosis present

## 2015-02-17 DIAGNOSIS — N390 Urinary tract infection, site not specified: Secondary | ICD-10-CM | POA: Diagnosis present

## 2015-02-17 DIAGNOSIS — I951 Orthostatic hypotension: Secondary | ICD-10-CM | POA: Diagnosis present

## 2015-02-17 DIAGNOSIS — Z7901 Long term (current) use of anticoagulants: Secondary | ICD-10-CM

## 2015-02-17 DIAGNOSIS — E039 Hypothyroidism, unspecified: Secondary | ICD-10-CM | POA: Diagnosis present

## 2015-02-17 DIAGNOSIS — I482 Chronic atrial fibrillation, unspecified: Secondary | ICD-10-CM | POA: Diagnosis present

## 2015-02-17 DIAGNOSIS — Z66 Do not resuscitate: Secondary | ICD-10-CM | POA: Diagnosis present

## 2015-02-17 DIAGNOSIS — R0602 Shortness of breath: Secondary | ICD-10-CM

## 2015-02-17 DIAGNOSIS — R42 Dizziness and giddiness: Secondary | ICD-10-CM

## 2015-02-17 DIAGNOSIS — R509 Fever, unspecified: Secondary | ICD-10-CM

## 2015-02-17 DIAGNOSIS — Z8673 Personal history of transient ischemic attack (TIA), and cerebral infarction without residual deficits: Secondary | ICD-10-CM

## 2015-02-17 DIAGNOSIS — I4891 Unspecified atrial fibrillation: Secondary | ICD-10-CM | POA: Diagnosis present

## 2015-02-17 DIAGNOSIS — I10 Essential (primary) hypertension: Secondary | ICD-10-CM | POA: Diagnosis present

## 2015-02-17 DIAGNOSIS — G459 Transient cerebral ischemic attack, unspecified: Secondary | ICD-10-CM | POA: Diagnosis not present

## 2015-02-17 DIAGNOSIS — I252 Old myocardial infarction: Secondary | ICD-10-CM | POA: Diagnosis not present

## 2015-02-17 DIAGNOSIS — R4781 Slurred speech: Secondary | ICD-10-CM | POA: Diagnosis present

## 2015-02-17 DIAGNOSIS — R197 Diarrhea, unspecified: Secondary | ICD-10-CM | POA: Diagnosis present

## 2015-02-17 DIAGNOSIS — D631 Anemia in chronic kidney disease: Secondary | ICD-10-CM | POA: Diagnosis present

## 2015-02-17 DIAGNOSIS — N183 Chronic kidney disease, stage 3 (moderate): Secondary | ICD-10-CM | POA: Diagnosis present

## 2015-02-17 DIAGNOSIS — E785 Hyperlipidemia, unspecified: Secondary | ICD-10-CM | POA: Diagnosis present

## 2015-02-17 DIAGNOSIS — Z885 Allergy status to narcotic agent status: Secondary | ICD-10-CM

## 2015-02-17 DIAGNOSIS — R338 Other retention of urine: Secondary | ICD-10-CM | POA: Diagnosis present

## 2015-02-17 DIAGNOSIS — G4733 Obstructive sleep apnea (adult) (pediatric): Secondary | ICD-10-CM | POA: Diagnosis present

## 2015-02-17 DIAGNOSIS — N401 Enlarged prostate with lower urinary tract symptoms: Secondary | ICD-10-CM | POA: Diagnosis present

## 2015-02-17 DIAGNOSIS — I5043 Acute on chronic combined systolic (congestive) and diastolic (congestive) heart failure: Secondary | ICD-10-CM | POA: Diagnosis present

## 2015-02-17 DIAGNOSIS — J189 Pneumonia, unspecified organism: Principal | ICD-10-CM | POA: Diagnosis present

## 2015-02-17 DIAGNOSIS — C4A9 Merkel cell carcinoma, unspecified: Secondary | ICD-10-CM | POA: Diagnosis present

## 2015-02-17 DIAGNOSIS — I2510 Atherosclerotic heart disease of native coronary artery without angina pectoris: Secondary | ICD-10-CM | POA: Diagnosis present

## 2015-02-17 DIAGNOSIS — N400 Enlarged prostate without lower urinary tract symptoms: Secondary | ICD-10-CM | POA: Diagnosis present

## 2015-02-17 DIAGNOSIS — Z9119 Patient's noncompliance with other medical treatment and regimen: Secondary | ICD-10-CM

## 2015-02-17 LAB — COMPREHENSIVE METABOLIC PANEL
ALBUMIN: 3.4 g/dL — AB (ref 3.5–5.0)
ALK PHOS: 216 U/L — AB (ref 38–126)
ALT: 38 U/L (ref 17–63)
ANION GAP: 9 (ref 5–15)
AST: 43 U/L — AB (ref 15–41)
BUN: 42 mg/dL — ABNORMAL HIGH (ref 6–20)
CALCIUM: 8.7 mg/dL — AB (ref 8.9–10.3)
CO2: 19 mmol/L — AB (ref 22–32)
CREATININE: 1.98 mg/dL — AB (ref 0.61–1.24)
Chloride: 108 mmol/L (ref 101–111)
GFR calc Af Amer: 35 mL/min — ABNORMAL LOW (ref >60)
GFR calc non Af Amer: 30 mL/min — ABNORMAL LOW (ref >60)
GLUCOSE: 111 mg/dL — AB (ref 65–99)
Potassium: 4.2 mmol/L (ref 3.5–5.1)
SODIUM: 136 mmol/L (ref 135–145)
Total Bilirubin: 1 mg/dL (ref 0.3–1.2)
Total Protein: 7.3 g/dL (ref 6.5–8.1)

## 2015-02-17 LAB — URINALYSIS, ROUTINE W REFLEX MICROSCOPIC
BILIRUBIN URINE: NEGATIVE
GLUCOSE, UA: NEGATIVE mg/dL
KETONES UR: NEGATIVE mg/dL
NITRITE: POSITIVE — AB
PH: 5.5 (ref 5.0–8.0)
PROTEIN: 100 mg/dL — AB
Specific Gravity, Urine: 1.015 (ref 1.005–1.030)

## 2015-02-17 LAB — CBC WITH DIFFERENTIAL/PLATELET
BASOS PCT: 0 %
Basophils Absolute: 0 10*3/uL (ref 0.0–0.1)
Eosinophils Absolute: 0.3 10*3/uL (ref 0.0–0.7)
Eosinophils Relative: 4 %
HEMATOCRIT: 31.6 % — AB (ref 39.0–52.0)
Hemoglobin: 10.3 g/dL — ABNORMAL LOW (ref 13.0–17.0)
Lymphocytes Relative: 22 %
Lymphs Abs: 1.8 10*3/uL (ref 0.7–4.0)
MCH: 33.2 pg (ref 26.0–34.0)
MCHC: 32.6 g/dL (ref 30.0–36.0)
MCV: 101.9 fL — AB (ref 78.0–100.0)
MONO ABS: 0.9 10*3/uL (ref 0.1–1.0)
MONOS PCT: 11 %
NEUTROS ABS: 5.1 10*3/uL (ref 1.7–7.7)
Neutrophils Relative %: 63 %
Platelets: 193 10*3/uL (ref 150–400)
RBC: 3.1 MIL/uL — ABNORMAL LOW (ref 4.22–5.81)
RDW: 16.8 % — AB (ref 11.5–15.5)
WBC: 8.1 10*3/uL (ref 4.0–10.5)

## 2015-02-17 LAB — CBG MONITORING, ED: Glucose-Capillary: 92 mg/dL (ref 65–99)

## 2015-02-17 LAB — URINE MICROSCOPIC-ADD ON

## 2015-02-17 LAB — I-STAT CG4 LACTIC ACID, ED: Lactic Acid, Venous: 1.26 mmol/L (ref 0.5–2.0)

## 2015-02-17 LAB — BRAIN NATRIURETIC PEPTIDE: B NATRIURETIC PEPTIDE 5: 820.8 pg/mL — AB (ref 0.0–100.0)

## 2015-02-17 MED ORDER — SODIUM CHLORIDE 0.9 % IV BOLUS (SEPSIS)
1000.0000 mL | Freq: Once | INTRAVENOUS | Status: AC
  Administered 2015-02-17: 1000 mL via INTRAVENOUS

## 2015-02-17 MED ORDER — PIPERACILLIN-TAZOBACTAM 3.375 G IVPB 30 MIN
3.3750 g | Freq: Once | INTRAVENOUS | Status: AC
  Administered 2015-02-17: 3.375 g via INTRAVENOUS
  Filled 2015-02-17: qty 50

## 2015-02-17 MED ORDER — SACCHAROMYCES BOULARDII 250 MG PO CAPS
250.0000 mg | ORAL_CAPSULE | Freq: Every morning | ORAL | Status: DC
  Administered 2015-02-18 – 2015-02-25 (×8): 250 mg via ORAL
  Filled 2015-02-17 (×8): qty 1

## 2015-02-17 MED ORDER — METOPROLOL SUCCINATE ER 25 MG PO TB24
50.0000 mg | ORAL_TABLET | Freq: Every day | ORAL | Status: DC
  Administered 2015-02-18: 50 mg via ORAL
  Filled 2015-02-17: qty 2

## 2015-02-17 MED ORDER — PHENAZOPYRIDINE HCL 200 MG PO TABS
200.0000 mg | ORAL_TABLET | Freq: Three times a day (TID) | ORAL | Status: DC
  Administered 2015-02-18: 200 mg via ORAL
  Filled 2015-02-17 (×4): qty 1

## 2015-02-17 MED ORDER — STROKE: EARLY STAGES OF RECOVERY BOOK
Freq: Once | Status: AC
  Administered 2015-02-18: 02:00:00
  Filled 2015-02-17: qty 1

## 2015-02-17 MED ORDER — RIVAROXABAN 15 MG PO TABS
15.0000 mg | ORAL_TABLET | Freq: Every day | ORAL | Status: DC
  Administered 2015-02-18 – 2015-02-24 (×8): 15 mg via ORAL
  Filled 2015-02-17 (×9): qty 1

## 2015-02-17 MED ORDER — SERTRALINE HCL 50 MG PO TABS
50.0000 mg | ORAL_TABLET | Freq: Every day | ORAL | Status: DC
  Administered 2015-02-18 – 2015-02-25 (×8): 50 mg via ORAL
  Filled 2015-02-17 (×8): qty 1

## 2015-02-17 MED ORDER — VANCOMYCIN HCL 10 G IV SOLR
1500.0000 mg | INTRAVENOUS | Status: AC
  Administered 2015-02-17: 1500 mg via INTRAVENOUS
  Filled 2015-02-17: qty 1500

## 2015-02-17 MED ORDER — FINASTERIDE 5 MG PO TABS
5.0000 mg | ORAL_TABLET | Freq: Every day | ORAL | Status: DC
Start: 2015-02-18 — End: 2015-02-25
  Administered 2015-02-18 – 2015-02-25 (×8): 5 mg via ORAL
  Filled 2015-02-17 (×8): qty 1

## 2015-02-17 MED ORDER — TAMSULOSIN HCL 0.4 MG PO CAPS: 0.4000 mg | ORAL_CAPSULE | Freq: Every day | ORAL | Status: DC

## 2015-02-17 MED ORDER — DEXTROSE 5 % IV SOLN
500.0000 mg | INTRAVENOUS | Status: DC
  Administered 2015-02-18 – 2015-02-19 (×2): 500 mg via INTRAVENOUS
  Filled 2015-02-17 (×4): qty 500

## 2015-02-17 MED ORDER — EZETIMIBE 10 MG PO TABS
10.0000 mg | ORAL_TABLET | Freq: Every morning | ORAL | Status: DC
  Administered 2015-02-18 – 2015-02-25 (×8): 10 mg via ORAL
  Filled 2015-02-17 (×8): qty 1

## 2015-02-17 MED ORDER — DOXAZOSIN MESYLATE 8 MG PO TABS
8.0000 mg | ORAL_TABLET | Freq: Every day | ORAL | Status: DC
  Administered 2015-02-18: 8 mg via ORAL
  Filled 2015-02-17: qty 1

## 2015-02-17 MED ORDER — DEXTROSE 5 % IV SOLN
1.0000 g | INTRAVENOUS | Status: DC
  Administered 2015-02-18 – 2015-02-19 (×2): 1 g via INTRAVENOUS
  Filled 2015-02-17 (×4): qty 10

## 2015-02-17 MED ORDER — LEVOTHYROXINE SODIUM 100 MCG PO TABS
100.0000 ug | ORAL_TABLET | Freq: Every day | ORAL | Status: DC
  Administered 2015-02-18 – 2015-02-24 (×7): 100 ug via ORAL
  Filled 2015-02-17 (×8): qty 1

## 2015-02-17 NOTE — ED Notes (Signed)
Attempted report x1. Nurse unavailable  

## 2015-02-17 NOTE — ED Notes (Signed)
Pt c/o generalized weakness, dizziness and hypotension.  Pt's wife reports slurred speech x 5 minutes earlier.  Sts this is the 2nd episode this week.  Pt has multiple health issues.

## 2015-02-17 NOTE — ED Notes (Signed)
Report given to [place], RN on floor.

## 2015-02-17 NOTE — Progress Notes (Signed)
[facility] consulted for home health services.  Patient to be admitted, transferred to [facility].  EDCM spoke to patient's wife at bedside [phone].  Patient lives at home with his wife.  Patient has had home health services with [facility] and [facility] in the past.  [facility] provided patient's wife with list of home health agencies in [facility], explained services.  Patient's wife reports they would like a wheelchair for the patient and an access ramp.  Patient's wife reports patient's pcp is Dr. Orland Mustard with [facility] [place] [facility] [place] [facility].  Patient has a walker, elevated toilet seat and a bedside commode in the shower used as a shower bench per patient's wife.  Patient's wife reports patient's son is in the house temporarily, but does not help with direct care of the patient.  EDCM provided patient's wife with list of private duty agencies and explained it would be an out of pocket expense.  Patient's wife thankful for services.  [facility] consult placed.  No further EDCM needs at this time.

## 2015-02-17 NOTE — Progress Notes (Signed)
Pharmacy Antibiotic Note  Harold Jennings is a 82 y.o. male admitted on 02/17/2015 with CAP.  PMH includes HTN, Afib, CAD, CHF.  Pharmacy has been consulted for ceftriaxone, azithromycin, and vancomycin dosing.  Plan:  Vancomycin 1000 IV every 24 hours.  Goal trough 15-20 mcg/mL.   Ceftriaxone and azithromycin dosing is appropriate as ordered per MD.  Follow clinical course, SCr, LOT - doubt empiric MRSA is necessary in this non-ICU patient with no known MRSA risk factors; would discontinue vancomycin once clinically stable for 24 hrs.  Recommend nasal MRSA PCR swab if suspicious as high negative predictive value for MRSA pneumonia  Height: 5\' 5"  (165.1 cm) Weight: 184 lb (83.462 kg) IBW/kg (Calculated) : 61.5  Temp (24hrs), Avg:97.7 F (36.5 C), Min:97.6 F (36.4 C), Max:97.8 F (36.6 C)   Recent Labs Lab 02/17/15 1553 02/17/15 1603  WBC 8.1  --   CREATININE 1.98*  --   LATICACIDVEN  --  1.26    Estimated Creatinine Clearance: 29.6 mL/min (by C-G formula based on Cr of 1.98).    Allergies  Allergen Reactions  . Oxybutynin Anaphylaxis   Today, 02/17/2015: Temp: afebrile WBC: wnl Renal: SCr appears to be at baseline ~2.0; CrCl 29.6 CG PCT pending; LA wnl  Antimicrobials this admission: 2/1 vancomycin >>  2/1 ceftriaxone >>  2/1 azithromycin >>   Dose adjustments this admission:   Microbiology results: 2/1 BCx: IP 2/1 UCx: IP  2/1 Sputum: [place]  MRSA PCR:  HIV: IP S. pneumo UAg: IP Legionella UAg: IP Flu panel: IP C diff: [place]   Thank you for allowing pharmacy to be a part of this patient's care.  Reuel Boom, PharmD, BCPS Pager: [phone] 02/17/2015, 8:26 PM

## 2015-02-17 NOTE — Consult Note (Signed)
Admission H&P    Chief Complaint: dizziness and slurred speech.  HPI: Harold Jennings is an 82 y.o. male with a history of hypertension, dyslipidemia, ischemic cardiomyopathy, atrial fibrillation and obstructive sleep apnea, presenting with transient vertigo and slurred speech which occurred yesterday morning. Patient had a similar episode about 2 weeks ago. Each episode lasted for only a few minutes. No facial droop was noted. He had no hemiparesthesias no hemisensory changes. CT scan of his head showed no acute intracranial abnormality. MRI showed no signs of acute stroke. Microvascular ischemic changes as well as old lacunar infarctions involving basal ganglia bilaterally and right cerebellum were noted. Patient has been on Xarelto for anticoagulation.  LSN: 02/16/2015 tPA Given: No: Deficits resolved; on anticoagulation. mRankin:  Past Medical History  Diagnosis Date  . Dyslipidemia   . HTN (hypertension)   . Obesity   . Increased liver enzymes     felt to be a fatty liver per ultrasound in 2011  . Dyspnea   . Ischemic cardiomyopathy   . Spinal stenosis   . Sleep apnea     bipap   . CAD (coronary artery disease)     Silent MI sometime prior to 2000. S/P cath in 2000 showing a total mid RCA with collaterals from the LCX. EF is 40 to50%; Last Myoview in 2010 showing inferior scar without ischemia. EF was 52%.  Marland Kitchen Dysrhythmia     atrial fib   . Edema     lower extremity   . Cancer ([place])     skin cancer   . Anemia   . Diverticular disease dec 2015  . Umbilical hernia   . Myocardial infarction ([place])   . CHF (congestive heart failure) [facility])     Past Surgical History  Procedure Laterality Date  . Total hip arthroplasty    . Eye surgery    . Lower back surgery      spinal stenosis  . Shoulder surgery    . Cardioversion N/A 06/06/2012    Procedure: CARDIOVERSION;  Surgeon: Thayer Headings, MD;  Location: [place];  Service: Cardiovascular;  Laterality: N/A;  . Cardiac  catheterization    . Partial colectomy N/A 06/12/2014    Procedure: SIGMOID COLECTOMY AND TAKEDOWN OF COLOVESICAL FISTULA ;  Surgeon: Armandina Gemma, MD;  Location: [facility];  Service: General;  Laterality: N/A;  . Umbilical hernia repair N/A 06/12/2014    Procedure: INCARCERATED UMBILICAL HERNIA REPAIR  ;  Surgeon: Armandina Gemma, MD;  Location: [facility];  Service: General;  Laterality: N/A;  . Cystostomy N/A 06/12/2014    Procedure: Jasmine December;  Surgeon: Franchot Gallo, MD;  Location: [facility];  Service: Urology;  Laterality: N/A;    Family History  Problem Relation Age of Onset  . Stroke Father   . Heart attack Father   . Hypertension Father   . Diabetes Father   . Drug abuse Mother   . Hypertension Brother   . Hypertension Brother   . Hypertension Brother   . Hypertension Sister    Social History:  reports that he has never smoked. He has never used smokeless tobacco. He reports that he drinks alcohol. He reports that he does not use illicit drugs.  Allergies:  Allergies  Allergen Reactions  . Oxybutynin Anaphylaxis    Medications: Patient's preadmission medications were reviewed by me.  ROS: History obtained from the patient  General ROS: negative for - chills, fatigue, fever, night sweats, weight gain or weight loss Psychological ROS: negative for -  behavioral disorder, hallucinations, memory difficulties, mood swings or suicidal ideation Ophthalmic ROS: negative for - blurry vision, double vision, eye pain or loss of vision ENT ROS: negative for - epistaxis, nasal discharge, oral lesions, sore throat, tinnitus or vertigo Allergy and Immunology ROS: negative for - hives or itchy/watery eyes Hematological and Lymphatic ROS: negative for - bleeding problems, bruising or swollen lymph nodes Endocrine ROS: negative for - galactorrhea, hair pattern changes, polydipsia/polyuria or temperature intolerance Respiratory ROS: negative for - cough, hemoptysis, shortness of breath or  wheezing Cardiovascular ROS: negative for - chest pain, dyspnea on exertion, edema or irregular heartbeat Gastrointestinal ROS: negative for - abdominal pain, diarrhea, hematemesis, nausea/vomiting or stool incontinence Genito-Urinary ROS: negative for - dysuria, hematuria, incontinence or urinary frequency/urgency Musculoskeletal ROS: negative for - joint swelling or muscular weakness Neurological ROS: as noted in HPI Dermatological ROS: negative for rash and skin lesion changes  Physical Examination: Blood pressure 129/94, pulse 81, temperature 97.6 F (36.4 C), temperature source Oral, resp. rate 19, height _0  (1.651 m), weight 83.462 kg (184 lb), SpO2 94 %.  HEENT-  Normocephalic, no lesions, without obvious abnormality.  Normal external eye and conjunctiva.  Normal TM's bilaterally.  Normal auditory canals and external ears. Normal external nose, mucus membranes and septum.  Normal pharynx. Neck supple with no masses, nodes, nodules or enlargement. Cardiovascular - regular rate and rhythm, S1, S2 normal, no murmur, click, rub or gallop Lungs - chest clear, no wheezing, rales, normal symmetric air entry Abdomen - soft, non-tender; bowel sounds normal; no masses,  no organomegaly Extremities - no joint deformities, effusion, or inflammation, moderate edema involving distal lower extremities bilaterally  Neurologic Examination: Mental Status: Alert, oriented, thought content appropriate.  Speech fluent without evidence of aphasia. Able to follow commands without difficulty. Cranial Nerves: II-Visual fields were normal. III/IV/VI-Pupils were equal and reacted normally to light. Extraocular movements were full and conjugate.    V/VII-no facial numbness and no facial weakness. VIII-normal. X-normal speech and symmetrical palatal movement. XI: trapezius strength/neck flexion strength normal bilaterally XII-midline tongue extension with normal strength. Motor: 5/5 bilaterally with  normal tone and bulk Sensory: Normal throughout except for reduced vibratory sensation in feet. Deep Tendon Reflexes: Trace to 1+ and symmetric. Plantars: Flexor bilaterally Cerebellar: Normal finger-to-nose testing. Carotid auscultation: Normal  Results for orders placed or performed during the hospital encounter of 02/17/15 (from the past 48 hour(s))  CBG monitoring, ED     Status: None   Collection Time: 02/17/15  3:43 PM  Result Value Ref Range   Glucose-Capillary 92 65 - 99 mg/dL  Comprehensive metabolic panel     Status: Abnormal   Collection Time: 02/17/15  3:53 PM  Result Value Ref Range   Sodium 136 135 - 145 mmol/L   Potassium 4.2 3.5 - 5.1 mmol/L   Chloride 108 101 - 111 mmol/L   CO2 19 (L) 22 - 32 mmol/L   Glucose, Bld 111 (H) 65 - 99 mg/dL   BUN 42 (H) 6 - 20 mg/dL   Creatinine, Ser 1.98 (H) 0.61 - 1.24 mg/dL   Calcium 8.7 (L) 8.9 - 10.3 mg/dL   Total Protein 7.3 6.5 - 8.1 g/dL   Albumin 3.4 (L) 3.5 - 5.0 g/dL   AST 43 (H) 15 - 41 U/L   ALT 38 17 - 63 U/L   Alkaline Phosphatase 216 (H) 38 - 126 U/L   Total Bilirubin 1.0 0.3 - 1.2 mg/dL   GFR calc non Af Amer 30 (L) >  60 mL/min   GFR calc Af Amer 35 (L) >60 mL/min    Comment: (NOTE) The eGFR has been calculated using the CKD EPI equation. This calculation has not been validated in all clinical situations. eGFR's persistently <60 mL/min signify possible Chronic Kidney Disease.    Anion gap 9 5 - 15  CBC WITH DIFFERENTIAL     Status: Abnormal   Collection Time: 02/17/15  3:53 PM  Result Value Ref Range   WBC 8.1 4.0 - 10.5 K/uL   RBC 3.10 (L) 4.22 - 5.81 MIL/uL   Hemoglobin 10.3 (L) 13.0 - 17.0 g/dL   HCT 31.6 (L) 39.0 - 52.0 %   MCV 101.9 (H) 78.0 - 100.0 fL   MCH 33.2 26.0 - 34.0 pg   MCHC 32.6 30.0 - 36.0 g/dL   RDW 16.8 (H) 11.5 - 15.5 %   Platelets 193 150 - 400 K/uL   Neutrophils Relative % 63 %   Neutro Abs 5.1 1.7 - 7.7 K/uL   Lymphocytes Relative 22 %   Lymphs Abs 1.8 0.7 - 4.0 K/uL   Monocytes  Relative 11 %   Monocytes Absolute 0.9 0.1 - 1.0 K/uL   Eosinophils Relative 4 %   Eosinophils Absolute 0.3 0.0 - 0.7 K/uL   Basophils Relative 0 %   Basophils Absolute 0.0 0.0 - 0.1 K/uL  Brain natriuretic peptide     Status: Abnormal   Collection Time: 02/17/15  3:53 PM  Result Value Ref Range   B Natriuretic Peptide 820.8 (H) 0.0 - 100.0 pg/mL  I-Stat CG4 Lactic Acid, ED  (not at  [facility])     Status: None   Collection Time: 02/17/15  4:03 PM  Result Value Ref Range   Lactic Acid, Venous 1.26 0.5 - 2.0 mmol/L  Urinalysis, Routine w reflex microscopic (not at [facility])     Status: Abnormal   Collection Time: 02/17/15  4:18 PM  Result Value Ref Range   Color, Urine ORANGE (A) YELLOW    Comment: BIOCHEMICALS MAY BE AFFECTED BY COLOR   APPearance TURBID (A) CLEAR   Specific Gravity, Urine 1.015 1.005 - 1.030   pH 5.5 5.0 - 8.0   Glucose, UA NEGATIVE NEGATIVE mg/dL   Hgb urine dipstick LARGE (A) NEGATIVE   Bilirubin Urine NEGATIVE NEGATIVE   Ketones, ur NEGATIVE NEGATIVE mg/dL   Protein, ur 100 (A) NEGATIVE mg/dL   Nitrite POSITIVE (A) NEGATIVE   Leukocytes, UA LARGE (A) NEGATIVE  Urine microscopic-add on     Status: Abnormal   Collection Time: 02/17/15  4:18 PM  Result Value Ref Range   Squamous Epithelial / LPF 0-5 (A) NONE SEEN   WBC, UA TOO NUMEROUS TO COUNT 0 - 5 WBC/hpf   RBC / HPF 6-30 0 - 5 RBC/hpf   Bacteria, UA FEW (A) NONE SEEN   Urine-Other LESS THAN 10 mL OF URINE SUBMITTED     Comment: MICROSCOPIC EXAM PERFORMED ON UNCONCENTRATED URINE   Dg Chest 2 View  02/17/2015  CLINICAL DATA:  Weakness, hypertension EXAM: CHEST  2 VIEW COMPARISON:  06/15/2014 FINDINGS: Multifocal patchy opacities, lingular and bilateral lower lobe predominant, suspicious for multifocal pneumonia. No definite pleural effusion. No pneumothorax. Mild cardiomegaly. Visualized osseous structures are within normal limits. IMPRESSION: Multifocal patchy opacities, lingular and bilateral lower lobe  predominant, suspicious for multifocal pneumonia. Electronically Signed   By: Julian Hy M.D.   On: 02/17/2015 17:33   Ct Head Wo Contrast  02/17/2015  CLINICAL DATA:  Pt c/o  generalized weakness, dizziness and hypotension. Pt's wife reports slurred speech x 5 minutes earlier. Sts this is the 2nd episode this week. Pt has multiple health issues. EXAM: CT HEAD WITHOUT CONTRAST TECHNIQUE: Contiguous axial images were obtained from the base of the skull through the vertex without intravenous contrast. COMPARISON:  None. FINDINGS: There is central and cortical atrophy. Significant periventricular white matter changes are consistent with small vessel disease. There is no intra or extra-axial fluid collection or mass lesion. The basilar cisterns and ventricles have a normal appearance. There is no CT evidence for acute infarction or hemorrhage. There is dense atherosclerotic calcification of the internal carotid arteries. The paranasal sinuses and mastoid air cells are normally aerated. IMPRESSION: 1. Atrophy and small vessel disease. 2. No evidence for acute intracranial abnormality. Electronically Signed   By: Nolon Nations M.D.   On: 02/17/2015 16:54   Mr Brain Wo Contrast  02/17/2015  CLINICAL DATA:  Generalized weakness, dizziness, and hypotension. Episode of slurred speech. EXAM: MRI HEAD WITHOUT CONTRAST TECHNIQUE: Multiplanar, multiecho pulse sequences of the brain and surrounding structures were obtained without intravenous contrast. COMPARISON:  CT head without contrast from the same day. FINDINGS: The diffusion-weighted images demonstrate no evidence for acute or subacute infarction. Moderate atrophy and severe diffuse periventricular and subcortical white matter disease is present bilaterally. There are remote lacunar infarcts within the basal ganglia bilaterally. A remote lacunar infarct is present within the right cerebellum. Mild white matter changes extend into the brainstem. The internal  auditory canals are within normal limits bilaterally. Flow is present in the major intracranial arteries. Scleral banding is noted on the right. Bilateral lens replacements are present. The globes and orbits are otherwise normal. The paranasal sinuses and mastoid air cells are clear. IMPRESSION: 1. No acute intracranial abnormality. 2. Moderate atrophy and severe diffuse periventricular and subcortical white matter disease bilaterally compatible with chronic microvascular ischemia. 3. Multiple remote lacunar infarcts involving the basal ganglia bilaterally of the right cerebellum. Electronically Signed   By: San Morelle M.D.   On: 02/17/2015 19:24    Assessment: 82 y.o. male with multiple risk factors for stroke presenting with recurrent episodes of vertigo with slurred speech, likely manifestations of transient ischemic attacks.  Stroke Risk Factors - atrial fibrillation, family history, hyperlipidemia and hypertension  Plan: 1. HgbA1c, fasting lipid panel 2. MRA  of the brain without contrast 3. Echocardiogram 4. Carotid dopplers 5. Prophylactic therapy-Anticoagulation: Xarelto 6. Risk factor modification 7. Telemetry monitoring  C.R. Nicole Kindred, MD  [facility] [phone]  02/17/2015, 8:30 PM

## 2015-02-17 NOTE — Progress Notes (Signed)
ANTICOAGULATION CONSULT NOTE - Initial Consult  Pharmacy Consult for Xarelto Indication: atrial fibrillation  Allergies  Allergen Reactions  . Oxybutynin Anaphylaxis    Patient Measurements: Height: 5\' 5"  (165.1 cm) Weight: 184 lb (83.462 kg) IBW/kg (Calculated) : 61.5  Vital Signs: Temp: 97.6 F (36.4 C) (02/01 1738) Temp Source: Oral (02/01 1738) BP: 129/94 mmHg (02/01 2002) Pulse Rate: 81 (02/01 2002)  Labs:  Recent Labs  02/17/15 1553  HGB 10.3*  HCT 31.6*  PLT 193  CREATININE 1.98*    Estimated Creatinine Clearance: 29.6 mL/min (by C-G formula based on Cr of 1.98).   Medical History: Past Medical History  Diagnosis Date  . Dyslipidemia   . HTN (hypertension)   . Obesity   . Increased liver enzymes     felt to be a fatty liver per ultrasound in 2011  . Dyspnea   . Ischemic cardiomyopathy   . Spinal stenosis   . Sleep apnea     bipap   . CAD (coronary artery disease)     Silent MI sometime prior to 2000. S/P cath in 2000 showing a total mid RCA with collaterals from the LCX. EF is 40 to50%; Last Myoview in 2010 showing inferior scar without ischemia. EF was 52%.  Marland Kitchen Dysrhythmia     atrial fib   . Edema     lower extremity   . Cancer ([place])     skin cancer   . Anemia   . Diverticular disease dec 2015  . Umbilical hernia   . Myocardial infarction ([place])   . CHF (congestive heart failure) ([facility])     Medications:  PTA Xarelto 15 mg daily with supper, LD 1/31  Assessment: 22 yoM admitted for r/o CVA and CAP.  On Xarelto PTA for Afib (CHA2DS2-VASc = 7).  To resume Xarelto while admitted   Hgb and Plt decreased from previous visit in June 2016 but still acceptable  CrCl chronically 15-50 ml/min  MRI negative for acute ischemic or hemorrhagic stroke  No documented bleeding  Goal of Therapy:  Prevent VTE   Plan:  Resume Xarelto 15 mg PO daily with supper Monitor for signs of bleeding or thrombosis  Reuel Boom, PharmD, BCPS Pager:  [phone] 02/17/2015, 8:29 PM

## 2015-02-17 NOTE — ED Notes (Signed)
Patient transported to MRI.  He remains awake, alert and oriented x 4 with clear speech.  He has just been seen by our hospitalist.

## 2015-02-17 NOTE — H&P (Addendum)
[facility] History and Physical   Patient: Harold Jennings [id number]   PCP: London Pepper, MD DOB: 08/07/1932   DOA: 02/17/2015   DOS: 02/17/2015   DOS: the patient was seen and examined on 02/17/2015  Referring physician: Dr. Tyrone Jennings Chief Complaint: Slurred speech with hypotension  HPI: [name] is a 82 y.o. male with Past medical history of essential hypertension, chronic A. fib on chronic anticoagulation, coronary disease, sleep apnea on BiPAP daily at bedtime, chronic combined CHF. Patient went to his PCP is a physical regular follow-up. When he was found to be having significantly low blood pressure. Patient has mentioned about 2 episodes of developing dizziness associated with slurring of the speech. The dizziness has been described as lightheadedness without any vertigo. Morning while he was in the shower he had an episode of slurred speech with dizziness and had fatigue after that and remained bedridden throughout the day until went to his PCP as office. He denies any tingling or numbness any headache.  Denies any chest pain or palpitation. Complains of shortness of breath. Denies any cough or congestion fever or chills. Denies any abdominal pain, nausea, vomiting, active bleeding. Complaints of diarrhea 3-4 loose watery bowel movements on a daily basis. Patient has history of recurrent UTI and has been on multiple antibiotics in last 2 years. Patient has been taking Imodium at home. He has multiple C. difficile PCR which were negative. But no recent in 1 week. He has developed swelling of his legs today as well.  The patient is coming from home.  At his baseline ambulates without supportive And is independent for most of his ADL; manages his medication on his own.  Review of Systems: as mentioned in the history of present illness.  A comprehensive review of the other systems is negative.  Past Medical History  Diagnosis Date  . Dyslipidemia   . HTN  (hypertension)   . Obesity   . Increased liver enzymes     felt to be a fatty liver per ultrasound in 2011  . Dyspnea   . Ischemic cardiomyopathy   . Spinal stenosis   . Sleep apnea     bipap   . CAD (coronary artery disease)     Silent MI sometime prior to 2000. S/P cath in 2000 showing a total mid RCA with collaterals from the LCX. EF is 40 to50%; Last Myoview in 2010 showing inferior scar without ischemia. EF was 52%.  Marland Kitchen Dysrhythmia     atrial fib   . Edema     lower extremity   . Cancer ([place])     skin cancer   . Anemia   . Diverticular disease dec 2015  . Umbilical hernia   . Myocardial infarction ([place])   . CHF (congestive heart failure) [facility])    Past Surgical History  Procedure Laterality Date  . Total hip arthroplasty    . Eye surgery    . Lower back surgery      spinal stenosis  . Shoulder surgery    . Cardioversion N/A 06/06/2012    Procedure: CARDIOVERSION;  Surgeon: Thayer Headings, MD;  Location: [place];  Service: Cardiovascular;  Laterality: N/A;  . Cardiac catheterization    . Partial colectomy N/A 06/12/2014    Procedure: SIGMOID COLECTOMY AND TAKEDOWN OF COLOVESICAL FISTULA ;  Surgeon: Armandina Gemma, MD;  Location: [facility];  Service: General;  Laterality: N/A;  . Umbilical hernia repair N/A 06/12/2014    Procedure: INCARCERATED UMBILICAL HERNIA  REPAIR  ;  Surgeon: Armandina Gemma, MD;  Location: [facility];  Service: General;  Laterality: N/A;  . Cystostomy N/A 06/12/2014    Procedure: Jasmine December;  Surgeon: Franchot Gallo, MD;  Location: [facility];  Service: Urology;  Laterality: N/A;   Social History:  reports that he has never smoked. He has never used smokeless tobacco. He reports that he drinks alcohol. He reports that he does not use illicit drugs.  Allergies  Allergen Reactions  . Oxybutynin Anaphylaxis    Family History  Problem Relation Age of Onset  . Stroke Father   . Heart attack Father   . Hypertension Father   . Diabetes Father   . Drug abuse  Mother   . Hypertension Brother   . Hypertension Brother   . Hypertension Brother   . Hypertension Sister     Prior to Admission medications   Medication Sig Start Date End Date Taking? Authorizing Provider  diphenhydrAMINE (BENADRYL) 25 mg capsule Take 25 mg by mouth at bedtime.    Yes Historical Provider, MD  doxazosin (CARDURA) 8 MG tablet Take 8 mg by mouth at bedtime.   Yes Historical Provider, MD  ezetimibe (ZETIA) 10 MG tablet Take 10 mg by mouth every morning.    Yes Historical Provider, MD  finasteride (PROSCAR) 5 MG tablet Take 5 mg by mouth daily.   Yes Historical Provider, MD  levothyroxine (SYNTHROID, LEVOTHROID) 100 MCG tablet Take 100 mcg by mouth daily. 09/20/13  Yes Historical Provider, MD  loperamide (IMODIUM) 1 MG/5ML solution Take 10 mLs (2 mg total) by mouth every 6 (six) hours as needed for diarrhea or loose stools. 01/18/14  Yes Robbie Lis, MD  metoprolol succinate (TOPROL-XL) 50 MG 24 hr tablet TAKE 1 TABLET BY MOUTH EVERY MORNING & 1/2 TABLET EVERY EVENING WITH OR IMMEDIATELY FOLLOWING A MEAL Patient taking differently: TAKE 1 TABLET BY MOUTH EVERYDAY 09/28/14  Yes Sherren Mocha, MD  phenazopyridine (PYRIDIUM) 200 MG tablet Take 200 mg by mouth 3 (three) times daily with meals.   Yes Historical Provider, MD  saccharomyces boulardii (FLORASTOR) 250 MG capsule Take 250 mg by mouth every morning.    Yes Historical Provider, MD  sertraline (ZOLOFT) 50 MG tablet Take 50 mg by mouth daily.   Yes Historical Provider, MD  silodosin (RAPAFLO) 8 MG CAPS capsule Take 8 mg by mouth daily with breakfast.   Yes Historical Provider, MD  XARELTO 15 MG TABS tablet TAKE 1 TABLET (15 MG TOTAL) BY MOUTH DAILY WITH SUPPER. 02/08/15  Yes Sherren Mocha, MD    Physical Exam: Filed Vitals:   02/17/15 1547 02/17/15 1627 02/17/15 1738 02/17/15 1756  BP:  125/72 135/67   Pulse:  68 74   Temp:   97.6 F (36.4 C)   TempSrc:   Oral   Resp:  14 15   Height:    5\' 5"  (1.651 m)  Weight:     83.462 kg (184 lb)  SpO2: 95% 94% 93%     General: Alert, Awake and Oriented to Time, Place and Person. Appear in mild distress Eyes: PERRL ENT: Oral Mucosa clear moist. Neck: no JVD Cardiovascular: S1 and S2 Present, no Murmur, Peripheral Pulses Present Respiratory: Bilateral Air entry equal and Decreased,  Bilateral Crackles, no wheezes Abdomen: Bowel Sound present, Soft and no tenderness Skin: no Rash Extremities: bilateral Pedal edema, no calf tenderness Neurologic: Mental status AAOx3, speech normal, attention normal,  Cranial Nerves PERRL, EOM normal and present, Motor strength bilateral equal strength  5/5,  Sensation present to light touch,  Reflexes present knee and biceps Cerebellar test normal finger nose finger.  Labs on Admission:  CBC:  Recent Labs Lab 02/17/15 1553  WBC 8.1  NEUTROABS 5.1  HGB 10.3*  HCT 31.6*  MCV 101.9*  PLT 193    CMP     Component Value Date/Time   NA 136 02/17/2015 1553   K 4.2 02/17/2015 1553   CL 108 02/17/2015 1553   CO2 19* 02/17/2015 1553   GLUCOSE 111* 02/17/2015 1553   BUN 42* 02/17/2015 1553   CREATININE 1.98* 02/17/2015 1553   CALCIUM 8.7* 02/17/2015 1553   PROT 7.3 02/17/2015 1553   ALBUMIN 3.4* 02/17/2015 1553   AST 43* 02/17/2015 1553   ALT 38 02/17/2015 1553   ALKPHOS 216* 02/17/2015 1553   BILITOT 1.0 02/17/2015 1553   GFRNONAA 30* 02/17/2015 1553   GFRAA 35* 02/17/2015 1553    No results for input(s): CKTOTAL, CKMB, CKMBINDEX, TROPONINI in the last 168 hours. BNP (last 3 results)  Recent Labs  02/17/15 1553  BNP 820.8*    ProBNP (last 3 results) No results for input(s): PROBNP in the last 8760 hours.   Radiological Exams on Admission: Dg Chest 2 View  02/17/2015  CLINICAL DATA:  Weakness, hypertension EXAM: CHEST  2 VIEW COMPARISON:  06/15/2014 FINDINGS: Multifocal patchy opacities, lingular and bilateral lower lobe predominant, suspicious for multifocal pneumonia. No definite pleural effusion. No  pneumothorax. Mild cardiomegaly. Visualized osseous structures are within normal limits. IMPRESSION: Multifocal patchy opacities, lingular and bilateral lower lobe predominant, suspicious for multifocal pneumonia. Electronically Signed   By: Julian Hy M.D.   On: 02/17/2015 17:33   Ct Head Wo Contrast  02/17/2015  CLINICAL DATA:  Pt c/o generalized weakness, dizziness and hypotension. Pt's wife reports slurred speech x 5 minutes earlier. Sts this is the 2nd episode this week. Pt has multiple health issues. EXAM: CT HEAD WITHOUT CONTRAST TECHNIQUE: Contiguous axial images were obtained from the base of the skull through the vertex without intravenous contrast. COMPARISON:  None. FINDINGS: There is central and cortical atrophy. Significant periventricular white matter changes are consistent with small vessel disease. There is no intra or extra-axial fluid collection or mass lesion. The basilar cisterns and ventricles have a normal appearance. There is no CT evidence for acute infarction or hemorrhage. There is dense atherosclerotic calcification of the internal carotid arteries. The paranasal sinuses and mastoid air cells are normally aerated. IMPRESSION: 1. Atrophy and small vessel disease. 2. No evidence for acute intracranial abnormality. Electronically Signed   By: Nolon Nations M.D.   On: 02/17/2015 16:54   EKG: Independently reviewed. atrial fibrillation, rate controlled.  Assessment/Plan 1. CAP (community acquired pneumonia) Patient presents with complaints of hypertension as well as dizziness as well as speech difficulty. No aspiration. No fever no chills. No cough. Chest echo shows multifocal pneumonia. Bilateral Leg swelling is also evident. Lactic acid is negative. We will check pro-calcitonin level. We will treat with ceftriaxone azithromycin and vancomycin. Follow the cultures.  2. Acute on chronic combined CHF. Patient has been given IV fluids here in the ER. The lip  significant leg swelling. Bilateral patchy infiltrate and also represent acute CHF. BNP is significantly elevated, earlier proBNP was minimally elevated. Currently the setting of pneumonia holding off on aggressive hydration as well as aggressive diuresis. We'll monitor clinical course. Serial troponins. Echocardiogram in the morning.  3. Slurred speech with dizziness. Next and patient out of slurred speech multiple episodes associated  with dizziness. Possibility of metabolic encephalopathy as well as hypotension and hypoxia causing this event. But given his history of CVA cannot be ruled out and therefore we will be getting an MRI of the brain. If the MRI is positive patient may need to be transferred to [facility]. Patient is orally on chronic anti-coagulation with Xarelto.  4 chronic A. fib, chronic anticoagulation. Continue Xarelto. Continue Toprol.  5. BPH. Continuing home medications.  6. Possible UTI. Continuing ceftriaxone. Monitor culture.  7. 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 Currently 1.98, will continue to closely monitor in the setting of infection.  Nutrition: cardiac diet DVT Prophylaxis: on therapeutic anticoagulation.  Advance goals of care discussion: DNR/DNI   Consults: none  Family Communication: family was present at bedside, opportunity was given to ask question and all questions were answered satisfactorily at the time of interview. Disposition: Admitted as inpatient, telemetry unit.  Addendum: MRI negative for CVA, likely TIA, discuss with neurology will transfer to [place].   Berle Mull 7:32 PM 02/17/2015    Author: Berle Mull, MD [facility] Pager: [phone] 02/17/2015  If 7PM-7AM, please contact night-coverage [email or web address] Password [email or web address]

## 2015-02-17 NOTE — ED Notes (Signed)
He is alert, articulate and is oriented x 4 with clear speech.  His wife remains with him.

## 2015-02-17 NOTE — ED Notes (Signed)
Patient shoes sent with [facility]. No other personal belongings, wife took all home.

## 2015-02-17 NOTE — ED Provider Notes (Signed)
CSN: [id number]     Arrival date & time 02/17/15  1535 History   First MD Initiated Contact with Patient 02/17/15 1538     Chief Complaint  Patient presents with  . Weakness  . Aphasia  . Hypotension     (Consider location/radiation/quality/duration/timing/severity/associated sxs/prior Treatment) Patient is a 82 y.o. male presenting with general illness. The history is provided by the patient.  Illness Severity:  Moderate Onset quality:  Sudden Duration:  1 week Timing:  Constant Progression:  Waxing and waning Chronicity:  New Associated symptoms: fatigue   Associated symptoms: no abdominal pain, no chest pain, no congestion, no fever, no headaches, no loss of consciousness, no nausea, no shortness of breath and no vomiting     82 yo M  With a chief complaint of general weakness. This been going on for the past couple weeks. Patient went to see his family physician today and was found to have a blood pressure in the 80 systolic. Is until he need to go to the emergency department. Patient denies any new symptomatology. 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  Patient states the symptoms not drastically changed. Patient has some lower extremity edema which also has not changed recently. Patient denies cough congestion shortness of breath. Patient denies nausea vomiting fevers or chills. Denies change in oral intake. Patient denies dark stool or blood in his stool. Patient is unable to quantify his symptoms any further. Denies any recent change in medications.  Past Medical History  Diagnosis Date  . Dyslipidemia   . HTN (hypertension)   . Obesity   . Increased liver enzymes     felt to be a fatty liver per ultrasound in 2011  . Dyspnea   . Ischemic cardiomyopathy   . Spinal stenosis   . Sleep apnea     bipap   . CAD (coronary artery disease)     Silent MI  sometime prior to 2000. S/P cath in 2000 showing a total mid RCA with collaterals from the LCX. EF is 40 to50%; Last Myoview in 2010 showing inferior scar without ischemia. EF was 52%.  Marland Kitchen Dysrhythmia     atrial fib   . Edema     lower extremity   . Cancer ([place])     skin cancer   . Anemia   . Diverticular disease dec 2015  . Umbilical hernia   . Myocardial infarction ([place])   . CHF (congestive heart failure) [facility])    Past Surgical History  Procedure Laterality Date  . Total hip arthroplasty    . Eye surgery    . Lower back surgery      spinal stenosis  . Shoulder surgery    . Cardioversion N/A 06/06/2012    Procedure: CARDIOVERSION;  Surgeon: Thayer Headings, MD;  Location: [facility];  Service: Cardiovascular;  Laterality: N/A;  . Cardiac catheterization    . Partial colectomy N/A 06/12/2014    Procedure: SIGMOID COLECTOMY AND TAKEDOWN OF COLOVESICAL FISTULA ;  Surgeon: Armandina Gemma, MD;  Location: [facility];  Service: General;  Laterality: N/A;  . Umbilical hernia repair N/A 06/12/2014    Procedure: INCARCERATED UMBILICAL HERNIA REPAIR  ;  Surgeon: Armandina Gemma, MD;  Location: [facility];  Service: General;  Laterality: N/A;  . Cystostomy N/A 06/12/2014    Procedure: Jasmine December;  Surgeon: Franchot Gallo, MD;  Location: [facility];  Service: Urology;  Laterality: N/A;   Family History  Problem Relation Age of Onset  . Stroke Father   . Heart attack Father   . Hypertension Father   . Diabetes Father   . Drug abuse Mother   . Hypertension Brother   . Hypertension Brother   . Hypertension Brother   . Hypertension Sister    Social History  Substance Use Topics  . Smoking status: Never Smoker   . Smokeless tobacco: Never Used  . Alcohol Use: Yes     Comment: used to take 2 glasses of wine daily, but has not for a while     Review of Systems  Constitutional: Positive for fatigue. Negative for fever.  HENT: Negative for congestion.   Respiratory: Negative for shortness of breath.    Cardiovascular: Negative for chest pain.  Gastrointestinal: Negative for nausea, vomiting and abdominal pain.  Neurological: Negative for loss of consciousness and headaches.      Allergies  Oxybutynin  Home Medications   Prior to Admission medications   Medication Sig Start Date End Date Taking? Authorizing Provider  diphenhydrAMINE (BENADRYL) 25 mg capsule Take 25 mg by mouth at bedtime.    Yes Historical Provider, MD  doxazosin (CARDURA) 8 MG tablet Take 8 mg by mouth at bedtime.   Yes Historical Provider, MD  ezetimibe (ZETIA) 10 MG tablet Take 10 mg by mouth every morning.    Yes Historical Provider, MD  finasteride (PROSCAR) 5 MG tablet Take 5 mg by mouth daily.   Yes Historical Provider, MD  levothyroxine (SYNTHROID, LEVOTHROID) 100 MCG tablet Take 100 mcg by mouth daily. 09/20/13  Yes Historical Provider, MD  loperamide (IMODIUM) 1 MG/5ML solution Take 10 mLs (2 mg total) by mouth every 6 (six) hours as needed for diarrhea or loose stools. 01/18/14  Yes Robbie Lis, MD  metoprolol succinate (TOPROL-XL) 50 MG 24 hr tablet TAKE 1 TABLET BY MOUTH EVERY MORNING & 1/2 TABLET EVERY EVENING WITH OR IMMEDIATELY FOLLOWING A MEAL Patient taking differently: TAKE 1 TABLET BY MOUTH EVERYDAY 09/28/14  Yes Sherren Mocha, MD  phenazopyridine (PYRIDIUM) 200 MG tablet Take 200 mg by mouth 3 (three) times daily with meals.   Yes Historical Provider, MD  saccharomyces boulardii (FLORASTOR) 250 MG capsule Take 250 mg by mouth every morning.    Yes Historical Provider, MD  sertraline (ZOLOFT) 50 MG tablet Take 50 mg by mouth daily.   Yes Historical Provider, MD  silodosin (RAPAFLO) 8 MG CAPS capsule Take 8 mg by mouth daily with breakfast.   Yes Historical Provider, MD  XARELTO 15 MG TABS tablet TAKE 1 TABLET (15 MG TOTAL) BY MOUTH DAILY WITH SUPPER. 02/08/15  Yes Sherren Mocha, MD   BP 147/77 mmHg  Pulse 78  Temp(Src) 98.2 F (36.8 C) (Oral)  Resp 19  Ht 5\' 5"  (1.651 m)  Wt 184 lb (83.462 kg)   BMI 30.62 kg/m2  SpO2 92% Physical Exam  Constitutional: He is oriented to person, place, and time. He appears well-developed and well-nourished.  HENT:  Head: Normocephalic and atraumatic.  Eyes: EOM are normal. Pupils are equal, round, and reactive to light.  Neck: Normal range of motion. Neck supple. No JVD present.  Cardiovascular: Normal rate and regular rhythm.  Exam reveals no gallop and no friction rub.   No murmur heard. Pulmonary/Chest: No respiratory distress. He has no wheezes. He has rales.  Abdominal: He exhibits no distension. There is no tenderness. There is no rebound and no guarding.  Musculoskeletal: Normal range of motion. He exhibits edema (2+ to knees).  Neurological: He is alert and oriented to person, place, and time.  Skin: No rash noted. No pallor.  Psychiatric: He has a normal mood and affect. His behavior is normal.  Nursing note and vitals reviewed.   ED Course  Procedures (including critical care time) Labs Review Labs Reviewed  COMPREHENSIVE METABOLIC PANEL - Abnormal; Notable for the following:    CO2 19 (*)    Glucose, Bld 111 (*)    BUN 42 (*)    Creatinine, Ser 1.98 (*)    Calcium 8.7 (*)    Albumin 3.4 (*)    AST 43 (*)    Alkaline Phosphatase 216 (*)    GFR calc non Af Amer 30 (*)    GFR calc Af Amer 35 (*)    All other components within normal limits  CBC WITH DIFFERENTIAL/PLATELET - Abnormal; Notable for the following:    RBC 3.10 (*)    Hemoglobin 10.3 (*)    HCT 31.6 (*)    MCV 101.9 (*)    RDW 16.8 (*)    All other components within normal limits  URINALYSIS, ROUTINE W REFLEX MICROSCOPIC (NOT AT [facility]) - Abnormal; Notable for the following:    Color, Urine ORANGE (*)    APPearance TURBID (*)    Hgb urine dipstick LARGE (*)    Protein, ur 100 (*)    Nitrite POSITIVE (*)    Leukocytes, UA LARGE (*)    All other components within normal limits  BRAIN NATRIURETIC PEPTIDE - Abnormal; Notable for the following:    B Natriuretic  Peptide 820.8 (*)    All other components within normal limits  URINE MICROSCOPIC-ADD ON - Abnormal; Notable for the following:    Squamous Epithelial / LPF 0-5 (*)    Bacteria, UA FEW (*)    All other components within normal limits  CULTURE, BLOOD (ROUTINE X 2)  CULTURE, BLOOD (ROUTINE X 2)  URINE CULTURE  GRAM STAIN  CULTURE, EXPECTORATED SPUTUM-ASSESSMENT  C DIFFICILE QUICK SCREEN W PCR REFLEX  HIV ANTIBODY (ROUTINE TESTING)  STREP PNEUMONIAE URINARY ANTIGEN  INFLUENZA PANEL BY PCR (TYPE A & B, H1N1)  LEGIONELLA ANTIGEN, URINE  COMPREHENSIVE METABOLIC PANEL  CBC WITH DIFFERENTIAL/PLATELET  TROPONIN I  TROPONIN I  TROPONIN I  PROCALCITONIN  HEMOGLOBIN A1C  LIPID PANEL  CBG MONITORING, ED  I-STAT CG4 LACTIC ACID, ED  I-STAT CG4 LACTIC ACID, ED    Imaging Review Dg Chest 2 View  02/17/2015  CLINICAL DATA:  Weakness, hypertension EXAM: CHEST  2 VIEW COMPARISON:  06/15/2014 FINDINGS: Multifocal patchy opacities, lingular and bilateral lower lobe predominant, suspicious for multifocal pneumonia. No definite pleural effusion. No pneumothorax. Mild cardiomegaly. Visualized osseous structures are within normal limits. IMPRESSION: Multifocal patchy opacities, lingular and bilateral lower lobe predominant, suspicious for multifocal pneumonia. Electronically Signed   By: Julian Hy M.D.   On: 02/17/2015 17:33   Ct Head Wo Contrast  02/17/2015  CLINICAL DATA:  Pt c/o generalized weakness, dizziness and hypotension. Pt's wife reports slurred speech x 5 minutes earlier. Sts this is the 2nd episode this week. Pt has multiple health issues. EXAM: CT HEAD WITHOUT CONTRAST TECHNIQUE: Contiguous axial images were obtained from the base of the skull through the vertex without intravenous contrast. COMPARISON:  None. FINDINGS: There is central and cortical atrophy. Significant periventricular white matter changes are consistent with small vessel disease. There is no intra or extra-axial fluid  collection  or mass lesion. The basilar cisterns and ventricles have a normal appearance. There is no CT evidence for acute infarction or hemorrhage. There is dense atherosclerotic calcification of the internal carotid arteries. The paranasal sinuses and mastoid air cells are normally aerated. IMPRESSION: 1. Atrophy and small vessel disease. 2. No evidence for acute intracranial abnormality. Electronically Signed   By: Nolon Nations M.D.   On: 02/17/2015 16:54   Mr Brain Wo Contrast  02/17/2015  CLINICAL DATA:  Generalized weakness, dizziness, and hypotension. Episode of slurred speech. EXAM: MRI HEAD WITHOUT CONTRAST TECHNIQUE: Multiplanar, multiecho pulse sequences of the brain and surrounding structures were obtained without intravenous contrast. COMPARISON:  CT head without contrast from the same day. FINDINGS: The diffusion-weighted images demonstrate no evidence for acute or subacute infarction. Moderate atrophy and severe diffuse periventricular and subcortical white matter disease is present bilaterally. There are remote lacunar infarcts within the basal ganglia bilaterally. A remote lacunar infarct is present within the right cerebellum. Mild white matter changes extend into the brainstem. The internal auditory canals are within normal limits bilaterally. Flow is present in the major intracranial arteries. Scleral banding is noted on the right. Bilateral lens replacements are present. The globes and orbits are otherwise normal. The paranasal sinuses and mastoid air cells are clear. IMPRESSION: 1. No acute intracranial abnormality. 2. Moderate atrophy and severe diffuse periventricular and subcortical white matter disease bilaterally compatible with chronic microvascular ischemia. 3. Multiple remote lacunar infarcts involving the basal ganglia bilaterally of the right cerebellum. Electronically Signed   By: San Morelle M.D.   On: 02/17/2015 19:24   I have personally reviewed and evaluated  these images and lab results as part of my medical decision-making.   EKG Interpretation   Date/Time:  Wednesday February 17 2015 15:42:19 EST Ventricular Rate:  76 PR Interval:    QRS Duration: 164 QT Interval:  452 QTC Calculation: 508 R Axis:   2 Text Interpretation:  Atrial fibrillation Right bundle branch block  similar to 06/06/12 Otherwise no significant change Confirmed by Kaleigha Chamberlin MD,  Quillian Quince [id number]) on 02/17/2015 3:51:33 PM      MDM   Final diagnoses:  UTI (lower urinary tract infection)  CAP (community acquired pneumonia)    82 yo M  with a chief complaint of weakness. Patient had a transient episode of hypotension while at his PCPs office. On arrival here patient is hypoxic with an O2 sat in the mid 80s. Patient has no symptoms of shortness of breath.  Will obtain a fairly broad workup chest x-ray urine troponin CBC BMP BNP.  Found to have UTI as well as multifocal pna.  Will admit with hypoxia.   The patients results and plan were reviewed and discussed.   Any x-rays performed were independently reviewed by myself.   Differential diagnosis were considered with the presenting HPI.  Medications  vancomycin (VANCOCIN) 1,500 mg in sodium chloride 0.9 % 500 mL IVPB (1,500 mg Intravenous New Bag/Given 02/17/15 2111)  doxazosin (CARDURA) tablet 8 mg (not administered)  ezetimibe (ZETIA) tablet 10 mg (not administered)  finasteride (PROSCAR) tablet 5 mg (not administered)  levothyroxine (SYNTHROID, LEVOTHROID) tablet 100 mcg (not administered)  metoprolol succinate (TOPROL-XL) 24 hr tablet 50 mg (not administered)  phenazopyridine (PYRIDIUM) tablet 200 mg (not administered)  saccharomyces boulardii (FLORASTOR) capsule 250 mg (not administered)  sertraline (ZOLOFT) tablet 50 mg (not administered)  cefTRIAXone (ROCEPHIN) 1 g in dextrose 5 % 50 mL IVPB (not administered)  azithromycin (ZITHROMAX) 500 mg in dextrose 5 %  250 mL IVPB (not administered)  Rivaroxaban (XARELTO)  tablet 15 mg (not administered)   stroke: mapping our early stages of recovery book (not administered)  sodium chloride 0.9 % bolus 1,000 mL (0 mLs Intravenous Stopped 02/17/15 1737)  piperacillin-tazobactam (ZOSYN) IVPB 3.375 g (0 g Intravenous Stopped 02/17/15 2047)    Filed Vitals:   02/17/15 2113 02/17/15 2114 02/17/15 2252 02/17/15 2302  BP: 139/93 139/93  147/77  Pulse: 71 78    Temp: 98.3 F (36.8 C)  98.2 F (36.8 C)   TempSrc: Oral     Resp: 16 17  19   Height:      Weight:      SpO2: 92% 92%  92%    Final diagnoses:  UTI (lower urinary tract infection)  CAP (community acquired pneumonia)    Admission/ observation were discussed with the admitting physician, patient and/or family and they are comfortable with the plan.    Deno Etienne, DO 02/17/15 2305

## 2015-02-17 NOTE — ED Notes (Signed)
UNABLE TO COLLECT LABS AT THIS TIME PATIENT IS TALKING WITH MD.

## 2015-02-18 ENCOUNTER — Inpatient Hospital Stay (HOSPITAL_COMMUNITY): Payer: Medicare Other

## 2015-02-18 DIAGNOSIS — G451 Carotid artery syndrome (hemispheric): Secondary | ICD-10-CM

## 2015-02-18 DIAGNOSIS — G459 Transient cerebral ischemic attack, unspecified: Secondary | ICD-10-CM | POA: Insufficient documentation

## 2015-02-18 LAB — LIPID PANEL
CHOL/HDL RATIO: 3 ratio
Cholesterol: 101 mg/dL (ref 0–200)
HDL: 34 mg/dL — AB (ref >40)
LDL CALC: 56 mg/dL (ref 0–99)
Triglycerides: 55 mg/dL (ref <150)
VLDL: 11 mg/dL (ref 0–40)

## 2015-02-18 LAB — CBC
HEMATOCRIT: 28.4 % — AB (ref 39.0–52.0)
HEMOGLOBIN: 9.3 g/dL — AB (ref 13.0–17.0)
MCH: 33.2 pg (ref 26.0–34.0)
MCHC: 32.7 g/dL (ref 30.0–36.0)
MCV: 101.4 fL — AB (ref 78.0–100.0)
Platelets: 165 10*3/uL (ref 150–400)
RBC: 2.8 MIL/uL — ABNORMAL LOW (ref 4.22–5.81)
RDW: 16.6 % — AB (ref 11.5–15.5)
WBC: 6.6 10*3/uL (ref 4.0–10.5)

## 2015-02-18 LAB — BASIC METABOLIC PANEL
Anion gap: 10 (ref 5–15)
BUN: 34 mg/dL — AB (ref 6–20)
CALCIUM: 8 mg/dL — AB (ref 8.9–10.3)
CHLORIDE: 109 mmol/L (ref 101–111)
CO2: 19 mmol/L — AB (ref 22–32)
CREATININE: 1.99 mg/dL — AB (ref 0.61–1.24)
GFR calc Af Amer: 35 mL/min — ABNORMAL LOW (ref >60)
GFR calc non Af Amer: 30 mL/min — ABNORMAL LOW (ref >60)
GLUCOSE: 138 mg/dL — AB (ref 65–99)
Potassium: 3.9 mmol/L (ref 3.5–5.1)
Sodium: 138 mmol/L (ref 135–145)

## 2015-02-18 LAB — URINE CULTURE

## 2015-02-18 LAB — HEPARIN ANTI-XA

## 2015-02-18 LAB — GLUCOSE, CAPILLARY: Glucose-Capillary: 127 mg/dL — ABNORMAL HIGH (ref 65–99)

## 2015-02-18 LAB — STREP PNEUMONIAE URINARY ANTIGEN: Strep Pneumo Urinary Antigen: NEGATIVE

## 2015-02-18 MED ORDER — ASPIRIN EC 81 MG PO TBEC
81.0000 mg | DELAYED_RELEASE_TABLET | Freq: Every day | ORAL | Status: DC
  Administered 2015-02-18 – 2015-02-25 (×8): 81 mg via ORAL
  Filled 2015-02-18 (×8): qty 1

## 2015-02-18 MED ORDER — FUROSEMIDE 10 MG/ML IJ SOLN
60.0000 mg | Freq: Two times a day (BID) | INTRAMUSCULAR | Status: DC
  Administered 2015-02-18: 60 mg via INTRAVENOUS
  Filled 2015-02-18: qty 8

## 2015-02-18 MED ORDER — ALBUTEROL SULFATE (2.5 MG/3ML) 0.083% IN NEBU: 2.5000 mg | INHALATION_SOLUTION | RESPIRATORY_TRACT | Status: DC | PRN

## 2015-02-18 MED ORDER — VANCOMYCIN HCL IN DEXTROSE 1-5 GM/200ML-% IV SOLN
1000.0000 mg | INTRAVENOUS | Status: DC
  Filled 2015-02-18: qty 200

## 2015-02-18 MED ORDER — METOPROLOL SUCCINATE ER 25 MG PO TB24
50.0000 mg | ORAL_TABLET | Freq: Every day | ORAL | Status: DC
  Administered 2015-02-19 – 2015-02-25 (×7): 50 mg via ORAL
  Filled 2015-02-18 (×7): qty 2

## 2015-02-18 MED ORDER — DOXAZOSIN MESYLATE 8 MG PO TABS
8.0000 mg | ORAL_TABLET | Freq: Every day | ORAL | Status: DC
  Administered 2015-02-18 – 2015-02-23 (×6): 8 mg via ORAL
  Filled 2015-02-18 (×6): qty 1

## 2015-02-18 MED ORDER — POTASSIUM CHLORIDE CRYS ER 20 MEQ PO TBCR
20.0000 meq | EXTENDED_RELEASE_TABLET | Freq: Every day | ORAL | Status: AC
Start: 2015-02-18 — End: 2015-02-19
  Administered 2015-02-18 – 2015-02-19 (×2): 20 meq via ORAL
  Filled 2015-02-18 (×2): qty 1

## 2015-02-18 NOTE — Progress Notes (Signed)
[facility] PROGRESS NOTE  Harold Jennings [id number] DOB: Mar 09, 1932 DOA: 02/17/2015 PCP: London Pepper, MD  HPI/Subjective: The patient notes he has a mild cough recently as well as dysuria and increased urinary frequency. He denies diarrhea. No hypotension this admission.  Assessment/Plan: CAP (community acquired pneumonia): CXR shows multifocal patchy infiltrates. Lactic acid negative, no leukocytosis. On Ceftriaxone and azithromycin. Continue O2, currently on 2L. Follow blood cultures.  Acute on chronic systolic CHF: Got IV fluids in the ER due to hypotension. 2+ BLE edema and apparent 10lb weight gain from baseline. Bilateral patchy infiltrate. BNP elevated. Await Echocardiogram Avoid further hydration, will add Lasix 60 BID. Monitor I&Os and daily weights. Marland Kitchen  TIA vs Mild Encephalopathy: Suspect metabolic encephalopathy and hypotension. MRI negative for acute infarct, multiple old infarcts noted. Echo, Carotid Doppler pending. No further hypotension. Speech clear today, no complaints of dizziness at present. Needs PT eval.  Recurrent UTI: has had multiple infections in the past year, most recent urine culture (03/2014) with Klebsiella. No fevers or leukocytosis but patient does report dysuria and increased frequency. Continue ceftriaxone and await urine culture.  Chronic A. Fib: Continue Xarelto. Rate controlled with Toprol.  Chronic kidney disease Stage 3-4: Cr bumped up to 2.5 in 06/2014, and has remained elevated since. Currently at 1.98. Monitor closely in the setting of infection and CHF exacerbation.  Hx of [id number] chest pain/SOB-continue Metoprolol, ASA, Await LDL-if not at goal-start statin  Anemia:likely 2/2 CKD. Stable for outpatient monitoring and follow up. No evidence of blood loss  Hypothyroidism:continue synthroid  [id number] Flomax/Cardura. Follow  OSA:non compliant to CPAP  Code Status: DNR Family Communication: None at bedside  Disposition  Plan: Remains inpatient for IV antibiotics.  DVT PPX: on Xarelto   Consultants:  Neurology  Procedures:  Echo  Antibiotics:  Azithromycin started 2/1  Vancomycin started 2/1  Ceftriaxone started 2/1      Objective: Filed Vitals:   02/18/15 0600 02/18/15 1048  BP: 129/81 128/62  Pulse: 80 84  Temp: 99.1 F (37.3 C)   Resp:  18   No intake or output data in the 24 hours ending 02/18/15 1135 Filed Weights   02/17/15 1756 02/18/15 0100  Weight: 83.462 kg (184 lb) 88.179 kg (194 lb 6.4 oz)    Exam:   General:  Alert, NAD  Cardiovascular: irregularly irregular  Respiratory: Few bibasilar rales-but mostly clear  Abdomen: soft, nontender, non distended  Musculoskeletal: 2+ BLE edema  Data Reviewed: Basic Metabolic Panel:  Recent Labs Lab 02/17/15 1553  NA 136  K 4.2  CL 108  CO2 19*  GLUCOSE 111*  BUN 42*  CREATININE 1.98*  CALCIUM 8.7*   Liver Function Tests:  Recent Labs Lab 02/17/15 1553  AST 43*  ALT 38  ALKPHOS 216*  BILITOT 1.0  PROT 7.3  ALBUMIN 3.4*   No results for input(s): LIPASE, AMYLASE in the last 168 hours. No results for input(s): AMMONIA in the last 168 hours. CBC:  Recent Labs Lab 02/17/15 1553 02/18/15 1058  WBC 8.1 6.6  NEUTROABS 5.1  --   HGB 10.3* 9.3*  HCT 31.6* 28.4*  MCV 101.9* 101.4*  PLT 193 165   Cardiac Enzymes: No results for input(s): CKTOTAL, CKMB, CKMBINDEX, TROPONINI in the last 168 hours. BNP (last 3 results)  Recent Labs  02/17/15 1553  BNP 820.8*    ProBNP (last 3 results) No results for input(s): PROBNP in the last 8760 hours.  CBG:  Recent Labs Lab 02/17/15 1543  GLUCAP 92  No results found for this or any previous visit (from the past 240 hour(s)).   Studies: Dg Chest 2 View  02/17/2015  CLINICAL DATA:  Weakness, hypertension EXAM: CHEST  2 VIEW COMPARISON:  06/15/2014 FINDINGS: Multifocal patchy opacities, lingular and bilateral lower lobe predominant, suspicious for  multifocal pneumonia. No definite pleural effusion. No pneumothorax. Mild cardiomegaly. Visualized osseous structures are within normal limits. IMPRESSION: Multifocal patchy opacities, lingular and bilateral lower lobe predominant, suspicious for multifocal pneumonia. Electronically Signed   By: Julian Hy M.D.   On: 02/17/2015 17:33   Ct Head Wo Contrast  02/17/2015  CLINICAL DATA:  Pt c/o generalized weakness, dizziness and hypotension. Pt's wife reports slurred speech x 5 minutes earlier. Sts this is the 2nd episode this week. Pt has multiple health issues. EXAM: CT HEAD WITHOUT CONTRAST TECHNIQUE: Contiguous axial images were obtained from the base of the skull through the vertex without intravenous contrast. COMPARISON:  None. FINDINGS: There is central and cortical atrophy. Significant periventricular white matter changes are consistent with small vessel disease. There is no intra or extra-axial fluid collection or mass lesion. The basilar cisterns and ventricles have a normal appearance. There is no CT evidence for acute infarction or hemorrhage. There is dense atherosclerotic calcification of the internal carotid arteries. The paranasal sinuses and mastoid air cells are normally aerated. IMPRESSION: 1. Atrophy and small vessel disease. 2. No evidence for acute intracranial abnormality. Electronically Signed   By: Nolon Nations M.D.   On: 02/17/2015 16:54   Mr Jodene Nam Head Wo Contrast  02/18/2015  CLINICAL DATA:  TIA.  Slurred speech EXAM: MRA HEAD WITHOUT CONTRAST TECHNIQUE: Angiographic images of the Circle of Willis were obtained using MRA technique without intravenous contrast. COMPARISON:  MRI head 02/17/2015 FINDINGS: Mild atherosclerotic disease in the cavernous segment of the internal carotid artery bilaterally without significant stenosis. Anterior and middle cerebral arteries widely patent without stenosis or aneurysm Both vertebral arteries are patent to the basilar without significant  stenosis. There is mild to moderate stenosis in the proximal to mid basilar due to atherosclerotic disease. PICA, superior cerebellar, and posterior cerebral arteries are widely patent without stenosis or aneurysm. IMPRESSION: Mild atherosclerotic disease involving the cavernous carotid bilaterally. Mild moderate stenosis of the basilar. No large vessel occlusion. Electronically Signed   By: Franchot Gallo M.D.   On: 02/18/2015 10:07   Mr Brain Wo Contrast  02/17/2015  CLINICAL DATA:  Generalized weakness, dizziness, and hypotension. Episode of slurred speech. EXAM: MRI HEAD WITHOUT CONTRAST TECHNIQUE: Multiplanar, multiecho pulse sequences of the brain and surrounding structures were obtained without intravenous contrast. COMPARISON:  CT head without contrast from the same day. FINDINGS: The diffusion-weighted images demonstrate no evidence for acute or subacute infarction. Moderate atrophy and severe diffuse periventricular and subcortical white matter disease is present bilaterally. There are remote lacunar infarcts within the basal ganglia bilaterally. A remote lacunar infarct is present within the right cerebellum. Mild white matter changes extend into the brainstem. The internal auditory canals are within normal limits bilaterally. Flow is present in the major intracranial arteries. Scleral banding is noted on the right. Bilateral lens replacements are present. The globes and orbits are otherwise normal. The paranasal sinuses and mastoid air cells are clear. IMPRESSION: 1. No acute intracranial abnormality. 2. Moderate atrophy and severe diffuse periventricular and subcortical white matter disease bilaterally compatible with chronic microvascular ischemia. 3. Multiple remote lacunar infarcts involving the basal ganglia bilaterally of the right cerebellum. Electronically Signed   By: Harrell Gave  Mattern M.D.   On: 02/17/2015 19:24    Scheduled Meds: . azithromycin  500 mg Intravenous Q24H  . cefTRIAXone  (ROCEPHIN)  IV  1 g Intravenous Q24H  . doxazosin  8 mg Oral QHS  . ezetimibe  10 mg Oral q morning - 10a  . finasteride  5 mg Oral Daily  . levothyroxine  100 mcg Oral QAC breakfast  . metoprolol succinate  50 mg Oral Daily  . phenazopyridine  200 mg Oral TID WC  . rivaroxaban  15 mg Oral Q supper  . saccharomyces boulardii  250 mg Oral q morning - 10a  . sertraline  50 mg Oral Daily   Continuous Infusions:   Principal Problem:   CAP (community acquired pneumonia) Active Problems:   Dyslipidemia   Essential hypertension   Merkel cell carcinoma (HCC)   Chronic atrial fibrillation ([facility])   BPH (benign prostatic hyperplasia)   Hypothyroidism   UTI (lower urinary tract infection)   Acute on chronic combined systolic and diastolic congestive heart failure ([facility])   Slurred speech   Dizziness    Time spent: [place] MD  [facility] . If 7PM-7AM, please contact night-coverage at [email or web address], password [facility] 02/18/2015, 11:35 AM  LOS: 1 day

## 2015-02-18 NOTE — Progress Notes (Signed)
STROKE TEAM PROGRESS NOTE   HISTORY OF PRESENT ILLNESS Harold Jennings is an 82 y.o. male with a history of hypertension, dyslipidemia, ischemic cardiomyopathy, atrial fibrillation and obstructive sleep apnea, presenting with transient vertigo and slurred speech which occurred yesterday morning. Patient had a similar episode about 2 weeks ago. Each episode lasted for only a few minutes. No facial droop was noted. He had no hemiparesthesias no hemisensory changes. CT scan of his head showed no acute intracranial abnormality. MRI showed no signs of acute stroke. Microvascular ischemic changes as well as old lacunar infarctions involving basal ganglia bilaterally and right cerebellum were noted. Patient has been on Xarelto for anticoagulation. He was last seen normal 02/16/2015 at unknown time.Patient was not administered TPA secondary to deficits resolved, on Xarelto. He was admitted for further evaluation and treatment.   SUBJECTIVE (INTERVAL HISTORY) No family is at the bedside.  Overall he feels his condition is stable. He stated that he had two episode of lightheadedness and slurry speech without vertigo. But he did have pneumonia and UTI, on antibiotics. Not sure the episodes was due to TIA or metabolic encephalopathy. Pt is on Xarelto for afib and stated compliance. Will add ASA 81 due to multiple small vessel stroke risk factors.   OBJECTIVE Temp:  [97.6 F (36.4 C)-99.5 F (37.5 C)] 99.1 F (37.3 C) (02/02 0600) Pulse Rate:  [68-85] 84 (02/02 1048) Cardiac Rhythm:  [-] Atrial fibrillation (02/02 0740) Resp:  [14-20] 18 (02/02 1048) BP: (118-148)/(62-94) 128/62 mmHg (02/02 1048) SpO2:  [85 %-95 %] 92 % (02/02 1048) Weight:  [83.462 kg (184 lb)-88.179 kg (194 lb 6.4 oz)] 88.179 kg (194 lb 6.4 oz) (02/02 0100)  CBC:   Recent Labs Lab 02/17/15 1553 02/18/15 1058  WBC 8.1 6.6  NEUTROABS 5.1  --   HGB 10.3* 9.3*  HCT 31.6* 28.4*  MCV 101.9* 101.4*  PLT 193 [id number]    Basic Metabolic  Panel:   Recent Labs Lab 02/17/15 1553 02/18/15 1058  NA 136 138  K 4.2 3.9  CL 108 109  CO2 19* 19*  GLUCOSE 111* 138*  BUN 42* 34*  CREATININE 1.98* 1.99*  CALCIUM 8.7* 8.0*    Lipid Panel:     Component Value Date/Time   CHOL 183 01/04/2009 1009   TRIG 116.0 01/04/2009 1009   HDL 39.80 01/04/2009 1009   CHOLHDL 5 01/04/2009 1009   VLDL 23.2 01/04/2009 1009   LDLCALC 120* 01/04/2009 1009   HgbA1c: No results found for: HGBA1C Urine Drug Screen: No results found for: LABOPIA, COCAINSCRNUR, LABBENZ, AMPHETMU, THCU, LABBARB    IMAGING I have personally reviewed the radiological images below and agree with the radiology interpretations.  Dg Chest 2 View 02/17/2015  Multifocal patchy opacities, lingular and bilateral lower lobe predominant, suspicious for multifocal pneumonia.   Ct Head Wo Contrast 02/17/2015  1. Atrophy and small vessel disease. 2. No evidence for acute intracranial abnormality.    Mr Harold Jennings Head Wo Contrast 02/18/2015   Mild atherosclerotic disease involving the cavernous carotid bilaterally. Mild moderate stenosis of the basilar. No large vessel occlusion.   Mr Brain Wo Contrast 02/17/2015  1. No acute intracranial abnormality. 2. Moderate atrophy and severe diffuse periventricular and subcortical white matter disease bilaterally compatible with chronic microvascular ischemia. 3. Multiple remote lacunar infarcts involving the basal ganglia bilaterally of the right cerebellum.   CUS - Bilateral: 1-39% ICA stenosis. Vertebral artery flow is antegrade.  2D echo - pending   Physical exam  Temp:  [  97.6 F (36.4 C)-99.5 F (37.5 C)] 98.8 F (37.1 C) (02/02 1419) Pulse Rate:  [71-85] 84 (02/02 1419) Resp:  [15-20] 18 (02/02 1419) BP: (121-148)/(62-94) 121/79 mmHg (02/02 1419) SpO2:  [92 %-95 %] 95 % (02/02 1419) Weight:  [172 lb (78.019 kg)-194 lb 6.4 oz (88.179 kg)] 172 lb (78.019 kg) (02/02 1301)  General - Well nourished, well developed, in no apparent  distress.  Ophthalmologic - Fundi not visualized due to noncooperation.  Cardiovascular - irregularly irregular heart rate and rhythm.  Mental Status -  Level of arousal and orientation to time, place, and person were intact. Language including expression, naming, repetition, comprehension was assessed and found intact. Fund of Knowledge was assessed and was intact.  Cranial Nerves II - XII - II - Visual field intact OU. III, IV, VI - Extraocular movements intact. V - Facial sensation intact bilaterally. VII - Facial movement intact bilaterally. VIII - Hearing & vestibular intact bilaterally. X - Palate elevates symmetrically. XI - Chin turning & shoulder shrug intact bilaterally. XII - Tongue protrusion intact.  Motor Strength - The patient's strength was normal in all extremities and pronator drift was absent.  Bulk was normal and fasciculations were absent.   Motor Tone - Muscle tone was assessed at the neck and appendages and was normal.  Reflexes - The patient's reflexes were 1+ in all extremities and he had no pathological reflexes.  Sensory - Light touch, temperature/pinprick were assessed and were symmetrical.    Coordination - The patient had normal movements in the hands and feet with no ataxia or dysmetria.  Tremor was absent.  Gait and Station - not tested due to safety concerns.   ASSESSMENT/PLAN [name] is a 82 y.o. male with history of  hypertension, dyslipidemia, ischemic cardiomyopathy, afib on Xarelto and OSA on BiPAP, presenting with two episodes of transient dizziness (lightheadedness) and slurred speech. He did not receive IV t-PA due to deficits resolved, on Xarelto.   TIA (slurred speech with dizziness x 2)   MRI  No acute stroke  MRA  Mild atherosclerosis, no large vessel stenosis  Carotid Doppler  unremarkable   2D Echo  pending   Factor X level appropriate for Xarelto use  LDL 56  HgbA1c ordered  xarelto for VTE  prophylaxis Diet Heart Room service appropriate?: Yes; Fluid consistency:: Thin  Xarelto (rivaroxaban) daily prior to admission, now on Xarelto (rivaroxaban) daily. Recommend addition of aspirin 81 mg daily to xarelto for multiple small vessel stroke risk factors.  Patient counseled to be compliant with his antithrombotic medications  Ongoing aggressive stroke risk factor management  Therapy recommendations:  pending   Disposition:  pending    Persistent atrial Fibrillation  Home anticoagulation:  Xarelto (rivaroxaban) daily continued in the hospital  Continue Xarelto at discharge.  Factor X level appropriate, indicating pt response to Xarelto  UTI and PNA  On Abx  Treatment per primary team  Hypertension  Stable  Hyperlipidemia  Home meds:  zetia 10,  resumed in hospital  LDL 56, TG 55  Continue zetia  Other Stroke Risk Factors  Advanced age  ETOH use  Family hx stroke (father)  Coronary artery disease  Obstructive sleep apnea, has not used CPAP n several years after losing over 100 pounds. Patient refused in hospital.  Ischemic cardiomyopathy, 09/10/2013 EF 40-45%  Other Active Problems  Community acquired pneumonia - on ceftriaxone, azithromycin, and vancomycin   Acute on chronic combined CHF  BPH  Possible UTI  Chronic  kidney disease, stage 3-4  MRSA  Hospital day # 1  Neurology will sign off. Please call with questions. Pt will follow up with Cecille Rubin NP at [facility] in one month. Thanks for the consult.  Rosalin Hawking, MD PhD Stroke Neurology 02/18/2015 5:18 PM    To contact Stroke Continuity provider, please refer to [email or web address] After hours, contact General Neurology

## 2015-02-18 NOTE — Progress Notes (Signed)
Initial Nutrition Assessment  DOCUMENTATION CODES:   Not applicable   INTERVENTION:  -RD will continue to monitor for needs -Pt declined ONS at this time  NUTRITION DIAGNOSIS:   Inadequate oral intake related to poor appetite as evidenced by per patient/family report, energy intake < 75% for > or equal to 3 months.  GOAL:   Patient will meet greater than or equal to 90% of their needs  MONITOR:   PO intake, I & O's, Labs  REASON FOR ASSESSMENT:   Malnutrition Screening Tool    ASSESSMENT:   Harold Jennings is a 82 y.o. male with Past medical history of essential hypertension, chronic A. fib on chronic anticoagulation, coronary disease, sleep apnea on BiPAP daily at bedtime, chronic combined CHF.  Pt refused CPAP at this time, apparently has not used it in several years after losing 100+ lbs according to respiratory note.  When I spoke with pt, he said he dropped 100# related to not having an appetite and not eating much when he was hungry. Pt states his appetite still has not returned to normal levels. He drinks 3 cups of mostly milk with a splash of coffee for breakfast, with 2 packs of nabs. Lunch is normally a sandwich and his wife cooks dinner, but he may not eat much of it depending on how he feels. Every now and then he will eat a large meal, but it appears he mostly pecks. He stated that food "just doesn't taste good anymore."  Pt's energy intake is surely below 75% but NFPE found plenty of loose skin, but no significant muscle or fat loss.  Labs:  BUN 42, Cr 1.98, Ca 8.7, Alk Phos 216 Medications: Xarelto  Diet Order:  Diet Heart Room service appropriate?: Yes; Fluid consistency:: Thin  Skin:  Reviewed, no issues  Last BM:  2/2  Height:   Ht Readings from Last 1 Encounters:  02/18/15 '5\' 8"'  (1.727 m)    Weight:   Wt Readings from Last 1 Encounters:  02/18/15 172 lb (78.019 kg)    Ideal Body Weight:  70 kg  BMI:  Body mass index is 26.16  kg/(m^2).  Estimated Nutritional Needs:   Kcal:  1750-2100 calories  Protein:  85-100 grams  Fluid:  1.2L  EDUCATION NEEDS:   No education needs identified at this time  Satira Anis. Canden Cieslinski, MS, RD LDN After Hours/Weekend Pager [phone]

## 2015-02-18 NOTE — Evaluation (Signed)
Speech Language Pathology Evaluation Patient Details Name: Harold Jennings MRN: [id number] DOB: March 26, 1932 Today's Date: 02/18/2015 Time: [id number] SLP Time Calculation (min) (ACUTE ONLY): 26 min  Problem List:  Patient Active Problem List   Diagnosis Date Noted  . UTI (lower urinary tract infection) 02/17/2015  . CAP (community acquired pneumonia) 02/17/2015  . Acute on chronic combined systolic and diastolic congestive heart failure ([place]) 02/17/2015  . Slurred speech 02/17/2015  . Dizziness 02/17/2015  . ARF (acute renal failure) ([place]) 07/12/2014  . BPH (benign prostatic hyperplasia) 07/12/2014  . Fall 07/12/2014  . Hypothyroidism 07/12/2014  . Colovesical fistula 06/11/2014  . Chronic atrial fibrillation ([place]) 01/14/2014  . Chronic combined systolic and diastolic congestive heart failure ([place]) 01/14/2014  . Merkel cell carcinoma ([place]) 06/30/2013  . Dyslipidemia 05/23/2008  . Essential hypertension 05/23/2008   Past Medical History:  Past Medical History  Diagnosis Date  . Dyslipidemia   . HTN (hypertension)   . Obesity   . Increased liver enzymes     felt to be a fatty liver per ultrasound in 2011  . Dyspnea   . Ischemic cardiomyopathy   . Spinal stenosis   . Sleep apnea     bipap   . CAD (coronary artery disease)     Silent MI sometime prior to 2000. S/P cath in 2000 showing a total mid RCA with collaterals from the LCX. EF is 40 to50%; Last Myoview in 2010 showing inferior scar without ischemia. EF was 52%.  Marland Kitchen Dysrhythmia     atrial fib   . Edema     lower extremity   . Cancer ([place])     skin cancer   . Anemia   . Diverticular disease dec 2015  . Umbilical hernia   . Myocardial infarction ([place])   . CHF (congestive heart failure) ([place])    Past Surgical History:  Past Surgical History  Procedure Laterality Date  . Total hip arthroplasty    . Eye surgery    . Lower back surgery      spinal stenosis  . Shoulder surgery    . Cardioversion N/A 06/06/2012   Procedure: CARDIOVERSION;  Surgeon: Thayer Headings, MD;  Location: [place];  Service: Cardiovascular;  Laterality: N/A;  . Cardiac catheterization    . Partial colectomy N/A 06/12/2014    Procedure: SIGMOID COLECTOMY AND TAKEDOWN OF COLOVESICAL FISTULA ;  Surgeon: Armandina Gemma, MD;  Location: [facility];  Service: General;  Laterality: N/A;  . Umbilical hernia repair N/A 06/12/2014    Procedure: INCARCERATED UMBILICAL HERNIA REPAIR  ;  Surgeon: Armandina Gemma, MD;  Location: [facility];  Service: General;  Laterality: N/A;  . Cystostomy N/A 06/12/2014    Procedure: Jasmine December;  Surgeon: Franchot Gallo, MD;  Location: [facility];  Service: Urology;  Laterality: N/A;   HPI:  82 y.o. male with history of hypertension, dyslipidemia, ischemic cardiomyopathy, atrial fibrillation and obstructive sleep apnea, presenting with transient vertigo and slurred speech. MRI without acute stroke.   Assessment / Plan / Recommendation Clinical Impression  Pt appears to be functioning at his cognitive-linguistic baseline, and speech is clear at the conversational level. He and his wife describe memory and word-finding difficulties over the last several years, with mild deficits noted today. SLP provided strategies to compensate for impairments and activities to challenge his cognitive skills. Encouraged them to monitor for cognitive-linguistic changes and to continue f/u with their primary care team. No acute SLP needs identified.    SLP Assessment  Patient does  not need any further Speech Lanaguage Pathology Services    Follow Up Recommendations  None;24 hour supervision/assistance    Frequency and Duration           SLP Evaluation Prior Functioning  Cognitive/Linguistic Baseline: Baseline deficits Baseline deficit details: pt and wife describe memory and word-finding issues over the past ~5 years Type of Home: House  Lives With: Spouse;Son Available Help at Discharge: Family   Cognition  Overall Cognitive  Status: History of cognitive impairments - at baseline    Comprehension  Auditory Comprehension Overall Auditory Comprehension: Appears within functional limits for tasks assessed    Expression Expression Primary Mode of Expression: Verbal Verbal Expression Overall Verbal Expression: Impaired at baseline   Oral / Motor  Oral Motor/Sensory Function Overall Oral Motor/Sensory Function: Within functional limits Motor Speech Overall Motor Speech: Appears within functional limits for tasks assessed   GO                   Germain Osgood, M.A. CCC-SLP [phone]  Germain Osgood 02/18/2015, 4:11 PM

## 2015-02-18 NOTE — Progress Notes (Signed)
Patient arrived to 5M09 from [place] via [place] at 0025, A/OX4. Patient oriented to room, equipment, and safety measures. MAEW. VSS. Cardiac monitoring initiated. Will monitor closely overnight.

## 2015-02-18 NOTE — Care Management Note (Signed)
Case Management Note  Patient Details  Name: Harold Jennings MRN: [id number] Date of Birth: 1932-04-15  Subjective/Objective:                    Action/Plan: Patient was admitted with CAP. Lives at home with spouse. Will follow for discharge needs.  Expected Discharge Date:   (unknown)               Expected Discharge Plan:     In-House Referral:     Discharge planning Services     Post Acute Care Choice:    Choice offered to:     DME Arranged:    DME Agency:     HH Arranged:    HH Agency:     Status of Service:  In process, will continue to follow  Medicare Important Message Given:    Date Medicare IM Given:    Medicare IM give by:    Date Additional Medicare IM Given:    Additional Medicare Important Message give by:     If discussed at [place] of Stay Meetings, dates discussed:    Additional Comments:  Rolm Baptise, RN 02/18/2015, 11:57 AM [phone]

## 2015-02-18 NOTE — Progress Notes (Signed)
Patient reports that he has not used CPAP in several years after losing 100+ lbs.  Services were offered, however, patient refused.  Patient verbalized understanding that he could call if he changes his mind.

## 2015-02-18 NOTE — Progress Notes (Signed)
SATURATION QUALIFICATIONS: (This note is used to comply with regulatory documentation for home oxygen)  Patient Saturations on Room Air at Rest = 90%  Patient Saturations on Room Air while Ambulating = 83%  Patient Saturations on NT Liters of oxygen while Ambulating = NT% (NT on O2 with ambulation up to 92% at rest on 2L O2)  Please briefly explain why patient needs home oxygen: patient SOB and hypoxic ambulating on room air.  [place], [name] 02/18/2015

## 2015-02-18 NOTE — Evaluation (Signed)
Physical Therapy Evaluation Patient Details Name: Harold Jennings MRN: [id number] DOB: 1932-06-06 Today's Date: 02/18/2015   History of Present Illness  Harold Jennings is a 82 y.o. male with Past medical history of essential hypertension, chronic A. fib on chronic anticoagulation, coronary disease, sleep apnea on BiPAP daily at bedtime, chronic combined CHF.  Admitted with symptoms of slurred speech, hypotension and vertigo.  Imaging wtih old lacunar infarcts bilat Basal ganglia and R cerebellum, but no acute infarcts.  Found to have CAP/  Clinical Impression  Patient presents with decreased mobility due to deficits listed in PT problem list and will benefit from skilled PT to address issues and allow return home with wife assist and HHPT. Did noted hypoxia with ambulation on RA.  Note oxygen saturation qualifications in note to follow.    Follow Up Recommendations Home health PT [facility] aide)    Equipment Recommendations  Wheelchair (measurements PT) (transport chair)    Recommendations for Other Services       Precautions / Restrictions Precautions Precautions: Fall Precaution Comments: watch O2 sats      Mobility  Bed Mobility Overal bed mobility: Needs Assistance Bed Mobility: Supine to Sit;Sit to Supine     Supine to sit: Min guard Sit to supine: Supervision   General bed mobility comments: assist for stabilizing trunk as he lifted himself up; able to lift legs into bed with supervision for return to supine  Transfers Overall transfer level: Needs assistance Equipment used: Rolling walker (2 wheeled) Transfers: Sit to/from Stand Sit to Stand: Supervision         General transfer comment: cues for hand placement  Ambulation/Gait Ambulation/Gait assistance: Supervision;Min guard Ambulation Distance (Feet): 80 Feet Assistive device: Rolling walker (2 wheeled) Gait Pattern/deviations: Step-through pattern;Decreased stride length     General Gait Details: noted SOB  with ambulation on RA; was 90% on RA at rest (after removal of O2) 82% after ambulating on RA; returned to 92% on 2L O2  Stairs            Wheelchair Mobility    Modified Rankin (Stroke Patients Only)       Balance Overall balance assessment: Needs assistance         Standing balance support: Bilateral upper extremity supported Standing balance-[name] Scale: Poor Standing balance comment: UE support needed for balance                             Pertinent Vitals/Pain Pain Assessment: No/denies pain    Home Living Family/patient expects to be discharged to:: Private residence Living Arrangements: Spouse/significant other Available Help at Discharge: Family Type of Home: House Home Access: Stairs to enter Entrance Stairs-Rails: Psychiatric nurse of Steps: 3 Home Layout: Able to live on main level with bedroom/bathroom;Two level Home Equipment: Walker - 4 wheels;Cane - single point;Bedside commode;Grab bars - tub/shower;Grab bars - toilet;Shower seat - built in      Prior Function Level of Independence: Independent with assistive device(s)         Comments: uses cane or rollator, assist for shower; wife assists on one side up steps     Hand Dominance        Extremity/Trunk Assessment   Upper Extremity Assessment: Overall WFL for tasks assessed           Lower Extremity Assessment: Generalized weakness      Cervical / Trunk Assessment: Kyphotic  Communication   Communication: No difficulties  Cognition Arousal/Alertness: Awake/alert Behavior During Therapy: WFL for tasks assessed/performed Overall Cognitive Status: History of cognitive impairments - at baseline                      General Comments      Exercises        Assessment/Plan    PT Assessment Patient needs continued PT services  PT Diagnosis Abnormality of gait;Generalized weakness   PT Problem List Decreased strength;Decreased activity  tolerance;Decreased balance;Decreased mobility;Cardiopulmonary status limiting activity  PT Treatment Interventions DME instruction;Balance training;Gait training;Stair training;Functional mobility training;Patient/family education;Therapeutic activities;Therapeutic exercise   PT Goals (Current goals can be found in the Care Plan section) Acute Rehab PT Goals Patient Stated Goal: To get stronger PT Goal Formulation: With patient/family Time For Goal Achievement: 02/25/15 Potential to Achieve Goals: Good    Frequency Min 3X/week   Barriers to discharge        Co-evaluation               End of Session Equipment Utilized During Treatment: Gait belt;Oxygen Activity Tolerance: Patient limited by fatigue Patient left: in bed;with call bell/phone within reach;with bed alarm set;with family/visitor present           Time: 1450-1527 PT Time Calculation (min) (ACUTE ONLY): 37 min   Charges:   PT Evaluation $PT Eval Moderate Complexity: 1 Procedure PT Treatments $Gait Training: 8-22 mins   PT G Codes:        Reginia Naas 03/12/2015, 4:59 PM  Magda Kiel, [place] 03-12-15

## 2015-02-18 NOTE — Consult Note (Signed)
   [facility] CM Inpatient Consult   02/18/2015  Harold Jennings 11-Mar-1932 [id number]   Follow up [facility] bedside visit made due to [facility] referral. Spoke with patient and wife at bedside. [name] was active with [facility] in the past. Please see chart review tab then notes for details from [place]. Spoke with patient and wife at bedside to offer [place] [facility] again. Mrs. Markwood states she remembers the program but pleasantly declines [facility] Care Management this time. She states they were inundated with services from [facility] and from home health. Mrs. Planck states patient " they [facility] RNCM) were trying to get him to comply and he is non-compliant and set in his ways". Therefore, both patient and wife decline services. Mrs. Fei accepted the [facility] brochure with contact information to call in future should they change their minds. Made inpatient RNCM aware.  Marthenia Rolling, MSN-Ed, RN,BSN [facility] Liaison [phone]

## 2015-02-18 NOTE — Consult Note (Signed)
   [facility] CM Inpatient Consult   02/18/2015  Harold Jennings Jul 07, 1932 [id number]   [facility] referral received for [facility] Care Management services. Went to bedside to speak with patient. However, nursing student was at bedside. Will come back at later time.   Marthenia Rolling, MSN-Ed, RN,BSN [facility] Liaison [phone]

## 2015-02-18 NOTE — Progress Notes (Signed)
VASCULAR LAB PRELIMINARY  PRELIMINARY  PRELIMINARY  PRELIMINARY  Carotid duplex completed.    Preliminary report:  1-39% ICA stenosis.  Vertebral artery flow is antegrade.   Okla Qazi, RVT 02/18/2015, 4:32 PM

## 2015-02-19 ENCOUNTER — Inpatient Hospital Stay (HOSPITAL_COMMUNITY): Payer: Medicare Other

## 2015-02-19 DIAGNOSIS — E785 Hyperlipidemia, unspecified: Secondary | ICD-10-CM

## 2015-02-19 DIAGNOSIS — I5043 Acute on chronic combined systolic (congestive) and diastolic (congestive) heart failure: Secondary | ICD-10-CM

## 2015-02-19 DIAGNOSIS — I482 Chronic atrial fibrillation: Secondary | ICD-10-CM

## 2015-02-19 DIAGNOSIS — I10 Essential (primary) hypertension: Secondary | ICD-10-CM

## 2015-02-19 DIAGNOSIS — I509 Heart failure, unspecified: Secondary | ICD-10-CM

## 2015-02-19 LAB — BASIC METABOLIC PANEL
ANION GAP: 12 (ref 5–15)
BUN: 29 mg/dL — ABNORMAL HIGH (ref 6–20)
CHLORIDE: 104 mmol/L (ref 101–111)
CO2: 21 mmol/L — AB (ref 22–32)
Calcium: 8.5 mg/dL — ABNORMAL LOW (ref 8.9–10.3)
Creatinine, Ser: 2.03 mg/dL — ABNORMAL HIGH (ref 0.61–1.24)
GFR calc Af Amer: 34 mL/min — ABNORMAL LOW (ref >60)
GFR calc non Af Amer: 29 mL/min — ABNORMAL LOW (ref >60)
GLUCOSE: 103 mg/dL — AB (ref 65–99)
POTASSIUM: 3.7 mmol/L (ref 3.5–5.1)
Sodium: 137 mmol/L (ref 135–145)

## 2015-02-19 LAB — PROCALCITONIN: Procalcitonin: <0.1 ng/mL

## 2015-02-19 LAB — LEGIONELLA ANTIGEN, URINE

## 2015-02-19 LAB — EXPECTORATED SPUTUM ASSESSMENT W REFEX TO RESP CULTURE

## 2015-02-19 LAB — INFLUENZA PANEL BY PCR (TYPE A & B)
H1N1FLUPCR: NOT DETECTED
INFLAPCR: NEGATIVE
Influenza B By PCR: NEGATIVE

## 2015-02-19 LAB — C DIFFICILE QUICK SCREEN W PCR REFLEX
C DIFFICLE (CDIFF) ANTIGEN: POSITIVE — AB
C Diff toxin: NEGATIVE

## 2015-02-19 LAB — EXPECTORATED SPUTUM ASSESSMENT W GRAM STAIN, RFLX TO RESP C

## 2015-02-19 LAB — MRSA PCR SCREENING: MRSA by PCR: NEGATIVE

## 2015-02-19 LAB — HEMOGLOBIN A1C
Hgb A1c MFr Bld: 4.5 % — ABNORMAL LOW (ref 4.8–5.6)
Mean Plasma Glucose: 82 mg/dL

## 2015-02-19 MED ORDER — LOPERAMIDE HCL 2 MG PO CAPS
2.0000 mg | ORAL_CAPSULE | ORAL | Status: DC | PRN
  Administered 2015-02-20: 2 mg via ORAL
  Filled 2015-02-19: qty 1

## 2015-02-19 MED ORDER — FUROSEMIDE 40 MG PO TABS
60.0000 mg | ORAL_TABLET | Freq: Two times a day (BID) | ORAL | Status: DC
  Administered 2015-02-20: 60 mg via ORAL
  Filled 2015-02-19: qty 1

## 2015-02-19 MED ORDER — FUROSEMIDE 10 MG/ML IJ SOLN
60.0000 mg | Freq: Every day | INTRAMUSCULAR | Status: AC
  Administered 2015-02-19: 60 mg via INTRAVENOUS
  Filled 2015-02-19: qty 8

## 2015-02-19 NOTE — Progress Notes (Signed)
   02/19/15 1527  PT Visit Information  Reason Eval/Treat Not Completed Fatigue/lethargy limiting ability to participate (Pt reports feeling fatigued from OT tx.  Discussed need for stair training and pt reports he able to negotiate steps with wife.  Pt re-edcuated but remains to refuse.  )

## 2015-02-19 NOTE — Progress Notes (Signed)
[facility] PROGRESS NOTE  Harold Jennings [id number] DOB: April 11, 1932 DOA: 02/17/2015 PCP: London Pepper, MD  Brief Narrative: 82 year old male who presented with dizziness and slurred speech, he was found to be hypotension at his PCPs office and was sent to the ED. MRI showed nothing acute but CXR showed patchy infiltrate concerning for PNA and CHF. He is on Azithromycin + Rocephin to cover PNA and UTI. Had -1.7L balance after Lasix. He seems to be improving from a respiratory standpoint, has no focal deficits and no hypotension.  Subjective: Feels improved but still with exertional dyspnea. Multiple loose BMs yesterday and today, he notes this is chronic and occurs and typically improves with Imodium.  Assessment/Plan: CAP (community acquired pneumonia): CXR shows multifocal patchy infiltrates. Lactic acid negative, no leukocytosis. Continue Ceftriaxone and azithromycin. He reports some exertional dyspnea but clinically improved. Wean O2 as able. Influenza PCR negative. Urine Legionella and streptococcal antigen negative. Follow blood cultures.  Acute on chronic systolic CHF: Got IV fluids in the ER due to hypotension. 2+ BLE edema and apparent 10lb weight gain from baseline. Bilateral patchy infiltrate. BNP elevated. Echocardiogram shows EF around 40-45% with new hypokinesis of mid inferior wall and basal area-will consult cardiology. Good urine output with Lasix 60 BID and weight down ~2lbs-we will decrease Lasix to 60 mg daily, and plan on transitioning to oral Lasix tomorrow. Monitor I&Os and daily weights.  TIA vs Mild Encephalopathy: Suspect metabolic encephalopathy and hypotension. MRI negative for acute infarct, multiple old infarcts noted. LDL 56, a1c 4.5. Carotid Doppler <40% stenosis. Echo pending. No further hypotension. Speech clear, no dizziness. PT recommending HHPT.  Recurrent UTI: has had multiple infections in the past year, most recent urine culture (03/2014) with  Klebsiella. No fevers or leukocytosis but pt reported dysuria and increased frequency. Continue ceftriaxone.  Diarrhea, chronic: multiple loose BMs yesterday and today, he notes this is chronic and occurs periodically but improves with Immodium. C diff unlikely given clinical history-though C. diff PCR positive-but C. diff toxin negative. Add Imodium.  Chronic A. Fib: Continue Xarelto. Rate controlled with Toprol.  Chronic kidney disease Stage 3-4: Cr bumped up to 2.5 in 06/2014, and has remained elevated since. Currently at 2. Follow closely with diuresis.  Hx of CAD: stable-no chest pain, continue Metoprolol, ASA, LDL 56 no need for statin. Echo shows new regional wall motion abnormality-cardiology consulted  Anemia:likely 2/2 CKD. Stable for outpatient monitoring and follow up. No evidence of active bleeding.  Hypothyroidism: Continue synthroid  [id number] Flomax/Cardura. Follow  OSA:non compliant to CPAP  Code Status: FULL at present, pt wants to speak with is wife and clarify Family Communication: None at bedside  Disposition Plan: Remains inpatient for IV antibiotics.  DVT PPX: on Xarelto   Consultants:  Neurology  Procedures:  Echo  Antibiotics:  Azithromycin started 2/1  Ceftriaxone started 2/1      Objective: Filed Vitals:   02/18/15 2139 02/19/15 0155  BP: 138/79 124/62  Pulse: 93 92  Temp: 98.1 F (36.7 C) 99 F (37.2 C)  Resp: 18 18    Intake/Output Summary (Last 24 hours) at 02/19/15 0951 Last data filed at 02/19/15 0500  Gross per 24 hour  Intake    240 ml  Output   2250 ml  Net  -2010 ml   Filed Weights   02/18/15 0100 02/18/15 1301 02/19/15 0500  Weight: 88.179 kg (194 lb 6.4 oz) 78.019 kg (172 lb) 76.114 kg (167 lb 12.8 oz)    Exam:  General:  Alert, NAD, speech clear  Cardiovascular: irregularly irregular  Respiratory: Few bibasilar rales-but mostly clear, no conversational dyspnea  Abdomen: soft, nontender, non  distended  Musculoskeletal: 1-2+ BLE edema  Data Reviewed: Basic Metabolic Panel:  Recent Labs Lab 02/17/15 1553 02/18/15 1058 02/19/15 0640  NA 136 138 137  K 4.2 3.9 3.7  CL 108 109 104  CO2 19* 19* 21*  GLUCOSE 111* 138* 103*  BUN 42* 34* 29*  CREATININE 1.98* 1.99* 2.03*  CALCIUM 8.7* 8.0* 8.5*   Liver Function Tests:  Recent Labs Lab 02/17/15 1553  AST 43*  ALT 38  ALKPHOS 216*  BILITOT 1.0  PROT 7.3  ALBUMIN 3.4*   No results for input(s): LIPASE, AMYLASE in the last 168 hours. No results for input(s): AMMONIA in the last 168 hours. CBC:  Recent Labs Lab 02/17/15 1553 02/18/15 1058  WBC 8.1 6.6  NEUTROABS 5.1  --   HGB 10.3* 9.3*  HCT 31.6* 28.4*  MCV 101.9* 101.4*  PLT 193 165   Cardiac Enzymes: No results for input(s): CKTOTAL, CKMB, CKMBINDEX, TROPONINI in the last 168 hours. BNP (last 3 results)  Recent Labs  02/17/15 1553  BNP 820.8*    ProBNP (last 3 results) No results for input(s): PROBNP in the last 8760 hours.  CBG:  Recent Labs Lab 02/17/15 1543 02/18/15 1137  GLUCAP 92 127*    Recent Results (from the past 240 hour(s))  Blood Culture (routine x 2)     Status: None (Preliminary result)   Collection Time: 02/17/15  4:00 PM  Result Value Ref Range Status   Specimen Description BLOOD RIGHT FOREARM  Final   Special Requests BOTTLES DRAWN AEROBIC AND ANAEROBIC 5ML  Final   Culture   Final    NO GROWTH < 24 HOURS Performed at [facility]    Report Status PENDING  Incomplete  Urine culture     Status: None   Collection Time: 02/17/15  4:18 PM  Result Value Ref Range Status   Specimen Description URINE, CLEAN CATCH  Final   Special Requests NONE  Final   Culture   Final    MULTIPLE SPECIES PRESENT, SUGGEST RECOLLECTION Performed at [facility]    Report Status 02/18/2015 FINAL  Final  C difficile quick scan w PCR reflex     Status: Abnormal   Collection Time: 02/19/15  5:07 AM  Result Value Ref Range  Status   C Diff antigen POSITIVE (A) NEGATIVE Final   C Diff toxin NEGATIVE NEGATIVE Final   C Diff interpretation   Final    C. difficile present, but toxin not detected. This indicates colonization. In most cases, this does not require treatment. If patient has signs and symptoms consistent with colitis, consider treatment. Requires ENTERIC precautions.     Studies: Dg Chest 2 View  02/17/2015  CLINICAL DATA:  Weakness, hypertension EXAM: CHEST  2 VIEW COMPARISON:  06/15/2014 FINDINGS: Multifocal patchy opacities, lingular and bilateral lower lobe predominant, suspicious for multifocal pneumonia. No definite pleural effusion. No pneumothorax. Mild cardiomegaly. Visualized osseous structures are within normal limits. IMPRESSION: Multifocal patchy opacities, lingular and bilateral lower lobe predominant, suspicious for multifocal pneumonia. Electronically Signed   By: Julian Hy M.D.   On: 02/17/2015 17:33   Ct Head Wo Contrast  02/17/2015  CLINICAL DATA:  Pt c/o generalized weakness, dizziness and hypotension. Pt's wife reports slurred speech x 5 minutes earlier. Sts this is the 2nd episode this week. Pt has multiple health  issues. EXAM: CT HEAD WITHOUT CONTRAST TECHNIQUE: Contiguous axial images were obtained from the base of the skull through the vertex without intravenous contrast. COMPARISON:  None. FINDINGS: There is central and cortical atrophy. Significant periventricular white matter changes are consistent with small vessel disease. There is no intra or extra-axial fluid collection or mass lesion. The basilar cisterns and ventricles have a normal appearance. There is no CT evidence for acute infarction or hemorrhage. There is dense atherosclerotic calcification of the internal carotid arteries. The paranasal sinuses and mastoid air cells are normally aerated. IMPRESSION: 1. Atrophy and small vessel disease. 2. No evidence for acute intracranial abnormality. Electronically Signed   By:  Nolon Nations M.D.   On: 02/17/2015 16:54   Mr Jodene Nam Head Wo Contrast  02/18/2015  CLINICAL DATA:  TIA.  Slurred speech EXAM: MRA HEAD WITHOUT CONTRAST TECHNIQUE: Angiographic images of the Circle of Willis were obtained using MRA technique without intravenous contrast. COMPARISON:  MRI head 02/17/2015 FINDINGS: Mild atherosclerotic disease in the cavernous segment of the internal carotid artery bilaterally without significant stenosis. Anterior and middle cerebral arteries widely patent without stenosis or aneurysm Both vertebral arteries are patent to the basilar without significant stenosis. There is mild to moderate stenosis in the proximal to mid basilar due to atherosclerotic disease. PICA, superior cerebellar, and posterior cerebral arteries are widely patent without stenosis or aneurysm. IMPRESSION: Mild atherosclerotic disease involving the cavernous carotid bilaterally. Mild moderate stenosis of the basilar. No large vessel occlusion. Electronically Signed   By: Franchot Gallo M.D.   On: 02/18/2015 10:07   Mr Brain Wo Contrast  02/17/2015  CLINICAL DATA:  Generalized weakness, dizziness, and hypotension. Episode of slurred speech. EXAM: MRI HEAD WITHOUT CONTRAST TECHNIQUE: Multiplanar, multiecho pulse sequences of the brain and surrounding structures were obtained without intravenous contrast. COMPARISON:  CT head without contrast from the same day. FINDINGS: The diffusion-weighted images demonstrate no evidence for acute or subacute infarction. Moderate atrophy and severe diffuse periventricular and subcortical white matter disease is present bilaterally. There are remote lacunar infarcts within the basal ganglia bilaterally. A remote lacunar infarct is present within the right cerebellum. Mild white matter changes extend into the brainstem. The internal auditory canals are within normal limits bilaterally. Flow is present in the major intracranial arteries. Scleral banding is noted on the right.  Bilateral lens replacements are present. The globes and orbits are otherwise normal. The paranasal sinuses and mastoid air cells are clear. IMPRESSION: 1. No acute intracranial abnormality. 2. Moderate atrophy and severe diffuse periventricular and subcortical white matter disease bilaterally compatible with chronic microvascular ischemia. 3. Multiple remote lacunar infarcts involving the basal ganglia bilaterally of the right cerebellum. Electronically Signed   By: San Morelle M.D.   On: 02/17/2015 19:24    Scheduled Meds: . aspirin EC  81 mg Oral Daily  . azithromycin  500 mg Intravenous Q24H  . cefTRIAXone (ROCEPHIN)  IV  1 g Intravenous Q24H  . doxazosin  8 mg Oral QHS  . ezetimibe  10 mg Oral q morning - 10a  . finasteride  5 mg Oral Daily  . levothyroxine  100 mcg Oral QAC breakfast  . metoprolol succinate  50 mg Oral Daily  . potassium chloride  20 mEq Oral Daily  . rivaroxaban  15 mg Oral Q supper  . saccharomyces boulardii  250 mg Oral q morning - 10a  . sertraline  50 mg Oral Daily   Continuous Infusions:   Principal Problem:   CAP (community  acquired pneumonia) Active Problems:   Dyslipidemia   Essential hypertension   Merkel cell carcinoma (HCC)   Chronic atrial fibrillation (HCC)   BPH (benign prostatic hyperplasia)   Hypothyroidism   UTI (lower urinary tract infection)   Acute on chronic combined systolic and diastolic congestive heart failure (HCC)   Slurred speech   Dizziness   TIA (transient ischemic attack)    Time spent: [place] PA-S  [facility] . If 7PM-7AM, please contact night-coverage at [email or web address], password [facility] 02/19/2015, 9:51 AM  LOS: 2 days     Attending MD note  Patient was seen, examined,treatment plan was discussed with the PA-S.  I have personally reviewed the clinical findings, lab, imaging studies and management of this patient in detail. I agree with the documentation, as recorded by the PA-S.   Doing  better-breathing much better today. Less lower extremity edema. -1.7 L balance, weight also decreased. Few bibasal rales. Renal function stable, decrease Lasix to 60 mg IV daily-suspect could transition to oral Lasix tomorrow. Has new onset wall motion abnormality on echo-have consulted cardiology.  Rest as above.   [facility]

## 2015-02-19 NOTE — Consult Note (Signed)
Patient ID: Harold Jennings MRN: [id number], DOB/AGE: [birth year]   Admit date: 02/17/2015   Primary Physician: London Pepper, MD Primary Cardiologist: Dr. Burt Knack  Pt. Profile:  82 y/o male with known CAD with prior cath showing total mid RCA occlusion with collaterals from the LCX, chronic systolic CHF and atrial fibrillation on Xarelto, admitted for dizziness and slurred speech. He is being managed for ?TIA vs Mild Encephalopathy. CXR c/w PNA and CHF. On antibiotics and diuretics. 2D echo shows continued LV systolic dysfunction, with no change in EF but with new wall motion abnormality. Cardiology consulted for abnormal echo.   Problem List  Past Medical History  Diagnosis Date  . Dyslipidemia   . HTN (hypertension)   . Obesity   . Increased liver enzymes     felt to be a fatty liver per ultrasound in 2011  . Dyspnea   . Ischemic cardiomyopathy   . Spinal stenosis   . Sleep apnea     bipap   . CAD (coronary artery disease)     Silent MI sometime prior to 2000. S/P cath in 2000 showing a total mid RCA with collaterals from the LCX. EF is 40 to50%; Last Myoview in 2010 showing inferior scar without ischemia. EF was 52%.  Marland Kitchen Dysrhythmia     atrial fib   . Edema     lower extremity   . Cancer ([place])     skin cancer   . Anemia   . Diverticular disease dec 2015  . Umbilical hernia   . Myocardial infarction ([place])   . CHF (congestive heart failure) [facility])     Past Surgical History  Procedure Laterality Date  . Total hip arthroplasty    . Eye surgery    . Lower back surgery      spinal stenosis  . Shoulder surgery    . Cardioversion N/A 06/06/2012    Procedure: CARDIOVERSION;  Surgeon: Thayer Headings, MD;  Location: [facility];  Service: Cardiovascular;  Laterality: N/A;  . Cardiac catheterization    . Partial colectomy N/A 06/12/2014    Procedure: SIGMOID COLECTOMY AND TAKEDOWN OF COLOVESICAL FISTULA ;  Surgeon: Armandina Gemma, MD;  Location: [facility];  Service: General;   Laterality: N/A;  . Umbilical hernia repair N/A 06/12/2014    Procedure: INCARCERATED UMBILICAL HERNIA REPAIR  ;  Surgeon: Armandina Gemma, MD;  Location: [facility];  Service: General;  Laterality: N/A;  . Cystostomy N/A 06/12/2014    Procedure: Jasmine December;  Surgeon: Franchot Gallo, MD;  Location: [facility];  Service: Urology;  Laterality: N/A;     Allergies  Allergies  Allergen Reactions  . Oxybutynin Anaphylaxis    HPI  82 y/o male, followed by Dr. Burt Knack, with a h/o CAD with silent MI sometime prior to 2000. S/P cath in 2000 showing a total mid RCA with collaterals from the LCX. EF is 40 to50%; Last Myoview in 2010 showing inferior scar without ischemia. EF was 52%. Echo 08/21/13 showed an EF of 40-45% with normal wall motion.Also with a h/o atrial fibrillation, anticoagulated with Xarelto (dose reduced for renal function). He underwent sigmoid colectomy and bladder repair by Dr. Harlow Asa and Dr. Diona Fanti in June 2016. He also has CKD with baseline SCr in the 2 range. He was last seen and examined by Dr. Burt Knack on 11/20/14 and did not complain of any significant issues. His rate was controlled and Dr. Burt Knack instructed him to continue current meds, including Xarelto, and to f/u in 6 months.  He presented [facility] on [facility]17[facility]15 with a complaint of dizziness and slurred speech. Prior to cone, he was seen by his PCP and was noted to be hypotensive, thus he was sent to ED for further evaluation. MRI showed nothing acute but he is being managed for ? TIA vs mild encephalopathy. CXR also showed patchy infiltrate concerning for PNA and CHF. UA also + for UTI. He was admitted by IM and started on antibiotics (Azithromycin + Rocephin). BNP was also abnormal at 820. SCr was 1.98. He was started on lasix for diuresis. A 2D echo was obtained demonstrating stable LVEF, compared to previous study in 2015, at 40-45%. There does however appear to be now moderate hypokinesis of the basal-midinferior myocardium. This  abnormality was not noted on prior study. Cardiology has been asked to see for abnormal echo. His EKG shows atrial fibrillation with a CVR and chronic RBBB. No significant change from prior. VSS.   He denies any recent CP or pressure. No dyspnea prior to admit but notes that since being admitted he has felt SOB and has required [place]. He notes new DOE and at rest. His dizziness and slurred speech has resolved.       Home Medications  Prior to Admission medications   Medication Sig Start Date End Date Taking? Authorizing Provider  diphenhydrAMINE (BENADRYL) 25 mg capsule Take 25 mg by mouth at bedtime.    Yes Historical Provider, MD  doxazosin (CARDURA) 8 MG tablet Take 8 mg by mouth at bedtime.   Yes Historical Provider, MD  ezetimibe (ZETIA) 10 MG tablet Take 10 mg by mouth every morning.    Yes Historical Provider, MD  finasteride (PROSCAR) 5 MG tablet Take 5 mg by mouth daily.   Yes Historical Provider, MD  levothyroxine (SYNTHROID, LEVOTHROID) 100 MCG tablet Take 100 mcg by mouth daily. 09/20/13  Yes Historical Provider, MD  loperamide (IMODIUM) 1 MG/5ML solution Take 10 mLs (2 mg total) by mouth every 6 (six) hours as needed for diarrhea or loose stools. 01/18/14  Yes Robbie Lis, MD  metoprolol succinate (TOPROL-XL) 50 MG 24 hr tablet TAKE 1 TABLET BY MOUTH EVERY MORNING & 1/2 TABLET EVERY EVENING WITH OR IMMEDIATELY FOLLOWING A MEAL Patient taking differently: TAKE 1 TABLET BY MOUTH EVERYDAY 09/28/14  Yes Sherren Mocha, MD  phenazopyridine (PYRIDIUM) 200 MG tablet Take 200 mg by mouth 3 (three) times daily with meals.   Yes Historical Provider, MD  saccharomyces boulardii (FLORASTOR) 250 MG capsule Take 250 mg by mouth every morning.    Yes Historical Provider, MD  sertraline (ZOLOFT) 50 MG tablet Take 50 mg by mouth daily.   Yes Historical Provider, MD  silodosin (RAPAFLO) 8 MG CAPS capsule Take 8 mg by mouth daily with breakfast.   Yes Historical Provider, MD  XARELTO 15 MG TABS tablet  TAKE 1 TABLET (15 MG TOTAL) BY MOUTH DAILY WITH SUPPER. 02/08/15  Yes Sherren Mocha, MD    Family History  Family History  Problem Relation Age of Onset  . Stroke Father   . Heart attack Father   . Hypertension Father   . Diabetes Father   . Drug abuse Mother   . Hypertension Brother   . Hypertension Brother   . Hypertension Brother   . Hypertension Sister     Social History  Social History   Social History  . Marital Status: Married    Spouse Name: N/A  . Number of Children: 2  . Years of Education: N/A  Occupational History  . retired    Social History Main Topics  . Smoking status: Never Smoker   . Smokeless tobacco: Never Used  . Alcohol Use: Yes     Comment: used to take 2 glasses of wine daily, but has not for a while   . Drug Use: No  . Sexual Activity: No   Other Topics Concern  . Not on file   Social History Narrative     Review of Systems General:  No chills, fever, night sweats or weight changes.  Cardiovascular:  No chest pain, + dyspnea on exertion, edema, no orthopnea, palpitations, paroxysmal nocturnal dyspnea. Dermatological: No rash, lesions/masses Respiratory: No cough, dyspnea Urologic: No hematuria, dysuria Abdominal:   No nausea, vomiting, diarrhea, bright red blood per rectum, melena, or hematemesis Neurologic:  No visual changes, wkns, changes in mental status. All other systems reviewed and are otherwise negative except as noted above.  Physical Exam  Blood pressure 108/63, pulse 90, temperature 99.1 F (37.3 C), temperature source Oral, resp. rate 20, height 5\' 8"  (1.727 m), weight 167 lb 12.8 oz (76.114 kg), SpO2 95 %.  General: Pleasant, NAD, using [place] [facility]: Normal affect. Neuro: Alert and oriented X 3. Moves all extremities spontaneously. HEENT: Normal  Neck: Supple without bruits or JVD. Lungs:  Resp regular and unlabored, course BS/ rales at the bases bilaterally L>R. Heart: irreg, irregular rhythm, regular rate no s3,  s4, or murmurs. Abdomen: Soft, non-tender, non-distended, BS + x 4.  Extremities: No clubbing, or cyanosis trace edema. DP/PT/Radials 2+ and equal bilaterally.  Labs  Troponin (Point of Care Test) No results for input(s): TROPIPOC in the last 72 hours. No results for input(s): CKTOTAL, CKMB, TROPONINI in the last 72 hours. Lab Results  Component Value Date   WBC 6.6 02/18/2015   HGB 9.3* 02/18/2015   HCT 28.4* 02/18/2015   MCV 101.4* 02/18/2015   PLT 165 02/18/2015    Recent Labs Lab 02/17/15 1553  02/19/15 0640  NA 136  < > 137  K 4.2  < > 3.7  CL 108  < > 104  CO2 19*  < > 21*  BUN 42*  < > 29*  CREATININE 1.98*  < > 2.03*  CALCIUM 8.7*  < > 8.5*  PROT 7.3  --   --   BILITOT 1.0  --   --   ALKPHOS 216*  --   --   ALT 38  --   --   AST 43*  --   --   GLUCOSE 111*  < > 103*  < > = values in this interval not displayed. Lab Results  Component Value Date   CHOL 101 02/18/2015   HDL 34* 02/18/2015   LDLCALC 56 02/18/2015   TRIG 55 02/18/2015   No results found for: DDIMER   Radiology/Studies  Dg Chest 2 View  02/17/2015  CLINICAL DATA:  Weakness, hypertension EXAM: CHEST  2 VIEW COMPARISON:  06/15/2014 FINDINGS: Multifocal patchy opacities, lingular and bilateral lower lobe predominant, suspicious for multifocal pneumonia. No definite pleural effusion. No pneumothorax. Mild cardiomegaly. Visualized osseous structures are within normal limits. IMPRESSION: Multifocal patchy opacities, lingular and bilateral lower lobe predominant, suspicious for multifocal pneumonia. Electronically Signed   By: Julian Hy M.D.   On: 02/17/2015 17:33   Ct Head Wo Contrast  02/17/2015  CLINICAL DATA:  Pt c/o generalized weakness, dizziness and hypotension. Pt's wife reports slurred speech x 5 minutes earlier. Sts this is the 2nd episode this  week. Pt has multiple health issues. EXAM: CT HEAD WITHOUT CONTRAST TECHNIQUE: Contiguous axial images were obtained from the base of the skull  through the vertex without intravenous contrast. COMPARISON:  None. FINDINGS: There is central and cortical atrophy. Significant periventricular white matter changes are consistent with small vessel disease. There is no intra or extra-axial fluid collection or mass lesion. The basilar cisterns and ventricles have a normal appearance. There is no CT evidence for acute infarction or hemorrhage. There is dense atherosclerotic calcification of the internal carotid arteries. The paranasal sinuses and mastoid air cells are normally aerated. IMPRESSION: 1. Atrophy and small vessel disease. 2. No evidence for acute intracranial abnormality. Electronically Signed   By: Nolon Nations M.D.   On: 02/17/2015 16:54   Mr Jodene Nam Head Wo Contrast  02/18/2015  CLINICAL DATA:  TIA.  Slurred speech EXAM: MRA HEAD WITHOUT CONTRAST TECHNIQUE: Angiographic images of the Circle of Willis were obtained using MRA technique without intravenous contrast. COMPARISON:  MRI head 02/17/2015 FINDINGS: Mild atherosclerotic disease in the cavernous segment of the internal carotid artery bilaterally without significant stenosis. Anterior and middle cerebral arteries widely patent without stenosis or aneurysm Both vertebral arteries are patent to the basilar without significant stenosis. There is mild to moderate stenosis in the proximal to mid basilar due to atherosclerotic disease. PICA, superior cerebellar, and posterior cerebral arteries are widely patent without stenosis or aneurysm. IMPRESSION: Mild atherosclerotic disease involving the cavernous carotid bilaterally. Mild moderate stenosis of the basilar. No large vessel occlusion. Electronically Signed   By: Franchot Gallo M.D.   On: 02/18/2015 10:07   Mr Brain Wo Contrast  02/17/2015  CLINICAL DATA:  Generalized weakness, dizziness, and hypotension. Episode of slurred speech. EXAM: MRI HEAD WITHOUT CONTRAST TECHNIQUE: Multiplanar, multiecho pulse sequences of the brain and surrounding  structures were obtained without intravenous contrast. COMPARISON:  CT head without contrast from the same day. FINDINGS: The diffusion-weighted images demonstrate no evidence for acute or subacute infarction. Moderate atrophy and severe diffuse periventricular and subcortical white matter disease is present bilaterally. There are remote lacunar infarcts within the basal ganglia bilaterally. A remote lacunar infarct is present within the right cerebellum. Mild white matter changes extend into the brainstem. The internal auditory canals are within normal limits bilaterally. Flow is present in the major intracranial arteries. Scleral banding is noted on the right. Bilateral lens replacements are present. The globes and orbits are otherwise normal. The paranasal sinuses and mastoid air cells are clear. IMPRESSION: 1. No acute intracranial abnormality. 2. Moderate atrophy and severe diffuse periventricular and subcortical white matter disease bilaterally compatible with chronic microvascular ischemia. 3. Multiple remote lacunar infarcts involving the basal ganglia bilaterally of the right cerebellum. Electronically Signed   By: San Morelle M.D.   On: 02/17/2015 19:24    ECG  Atrial fibrillation with a CVR and chronic RBBB.   Echocardiogram  Study Conclusions  - Left ventricle: The cavity size was mildly dilated. Wall thickness was increased in a pattern of mild LVH. Systolic function was mildly to moderately reduced. The estimated ejection fraction was in the range of 40% to 45%. Moderate hypokinesis of the basal-midinferior myocardium. - Aortic valve: There was trivial regurgitation. Valve area (VTI): 2.1 cm^2. Valve area (Vmax): 2.31 cm^2. Valve area (Vmean): 1.83 cm^2. - Left atrium: The atrium was severely dilated. - Right ventricle: The cavity size was mildly dilated. - Right atrium: The atrium was moderately dilated. - Pulmonary arteries: Systolic pressure was mildly to  moderately increased.  PA peak pressure: 39 mm Hg (S).     ASSESSMENT AND PLAN  Principal Problem:   CAP (community acquired pneumonia) Active Problems:   Dyslipidemia   Essential hypertension   Merkel cell carcinoma (HCC)   Chronic atrial fibrillation (HCC)   BPH (benign prostatic hyperplasia)   Hypothyroidism   UTI (lower urinary tract infection)   Acute on chronic combined systolic and diastolic congestive heart failure (HCC)   Slurred speech   Dizziness   TIA (transient ischemic attack)   1. Abnormal Echo: he has h/o chronic systolic CHF and his previous echo 08/2013 showed an EF of 40-45%, which remains the same. Dr. Burt Knack outlined that he felt that his reduced EF was likely related to atrial fibrillation. His recent echo however does show new WMA not noted on previous study. He now has moderate hypokinesis of the basal-midinferior myocardium. He denies any recent CP. No dyspnea prior to admit, although he does note new onset of dyspnea since being admitted. He does have a h/o silent MI in the past, so cannot fully rely on absence of chest pain. He also has a h/o CKD with baseline SCr ~2.0, thus not an ideal candidate for cath. His EKG shows chronic atrial fibrillation with chronic RBBB. No significant changes. Given renal function and relatively stable EF and lack of chest discomfort, medical therapy may be the best option at this point. Can consider cath if he fails medical therapy. MD to follow with further recommendations.     Signed, Lyda Jester, PA-C 02/19/2015, 3:31 PM

## 2015-02-19 NOTE — Progress Notes (Signed)
OT Cancellation Note  Patient Details Name: Harold Jennings MRN: [id number] DOB: [birth year]   Cancelled Treatment:    Reason Eval/Treat Not Completed: Patient at procedure or test/ unavailable (ECHO). Will follow up for OT eval as time allows and pt is appropriate.   Binnie Kand M.S., OTR/L Pager: [phone]  02/19/2015, 11:26 AM

## 2015-02-19 NOTE — Progress Notes (Signed)
  Echocardiogram 2D Echocardiogram has been performed.  Tresa Res 02/19/2015, 12:12 PM

## 2015-02-19 NOTE — Procedures (Signed)
Pt does not wish to wear cpap machine at this time, will notify nurse if anything changes. Pt is stable at this time RT will continue to monitor.

## 2015-02-19 NOTE — Evaluation (Signed)
Occupational Therapy Evaluation Patient Details Name: Harold Jennings MRN: [id number] DOB: 08-15-1932 Today's Date: 02/19/2015    History of Present Illness Harold Jennings is a 82 y.o. male with Past medical history of essential hypertension, chronic A. fib on chronic anticoagulation, coronary disease, sleep apnea on BiPAP daily at bedtime, chronic combined CHF.  Admitted with symptoms of slurred speech, hypotension and vertigo.  Imaging wtih old lacunar infarcts bilat Basal ganglia and R cerebellum, but no acute infarcts.  Found to have CAP/   Clinical Impression   Pt reports he was able to manage ADLs independently with wife supervising for shower to make sure he didn't fall PTA. Pt overall deconditioned and fatiguing quickly with functional activities. Recommending HHOT for follow up in order to maximize independence and safety with ADLs and functional mobility. Pt would benefit from continued skilled OT to address established goals.    Follow Up Recommendations  Home health OT;Supervision/Assistance - 24 hour [facility] aide)    Equipment Recommendations  None recommended by OT    Recommendations for Other Services       Precautions / Restrictions Precautions Precautions: Fall Precaution Comments: watch O2 sats. pt reports multiple falls in shower  Restrictions Weight Bearing Restrictions: No      Mobility Bed Mobility Overal bed mobility: Needs Assistance Bed Mobility: Supine to Sit;Sit to Supine     Supine to sit: Min guard Sit to supine: Min guard   General bed mobility comments: Min guard overall for safety and balance. VCs for technique returning to bed. Use of hand rails and HOB slightly elevated.  Transfers Overall transfer level: Needs assistance Equipment used: Rolling walker (2 wheeled) Transfers: Sit to/from Stand Sit to Stand: Min assist         General transfer comment: Min assist for balance with sit to stand x 3. Pt with increased fatigue requiring min  support in standing.    Balance Overall balance assessment: Needs assistance Sitting-balance support: Feet supported;No upper extremity supported Sitting balance-[name] Scale: Fair     Standing balance support: Single extremity supported;During functional activity Standing balance-[name] Scale: Poor                              ADL Overall ADL's : Needs assistance/impaired Eating/Feeding: Set up;Sitting   Grooming: Minimal assistance;Standing       Lower Body Bathing: Minimal assistance;Sit to/from stand       Lower Body Dressing: Minimal assistance;Sit to/from stand Lower Body Dressing Details (indicate cue type and reason): Pt able to doff socks sitting EOB. Increased fatigue after functional activities and requested assist for donning socks.     Toileting- Clothing Manipulation and Hygiene: Moderate assistance;Sit to/from stand Toileting - Clothing Manipulation Details (indicate cue type and reason): Pt able to complete peri care with min assist for balance in standing but additional assist was required to get fully clean after BM in bed and in standing.     Functional mobility during ADLs: Minimal assistance;Rolling walker (for sit to stand) General ADL Comments: No family present for OT eval. Pt with frequency of urine and bowel; in standing pt reports needing to use bathroom but unable to make it there, incontinent episode occured. Pt with increased fatigue with functional activities on RA but no SOB noted; 2.5 L O2 applied once returned to bed.     Vision     Perception     Praxis      Pertinent  Vitals/Pain Pain Assessment: No/denies pain     Hand Dominance     Extremity/Trunk Assessment Upper Extremity Assessment Upper Extremity Assessment: Overall WFL for tasks assessed   Lower Extremity Assessment Lower Extremity Assessment: Defer to PT evaluation   Cervical / Trunk Assessment Cervical / Trunk Assessment: Kyphotic   Communication  Communication Communication: No difficulties   Cognition Arousal/Alertness: Awake/alert Behavior During Therapy: WFL for tasks assessed/performed Overall Cognitive Status: No family/caregiver present to determine baseline cognitive functioning       Memory: Decreased short-term memory             General Comments       Exercises       Shoulder Instructions      Home Living Family/patient expects to be discharged to:: Private residence Living Arrangements: Spouse/significant other Available Help at Discharge: Family;Available 24 hours/day Type of Home: House Home Access: Stairs to enter [employer] of Steps: 3 Entrance Stairs-Rails: Right;Left Home Layout: Able to live on main level with bedroom/bathroom;Two level     Bathroom Shower/Tub: Occupational psychologist: Standard Bathroom Accessibility: Yes How Accessible: Accessible via walker Home Equipment: Ewing - 4 wheels;Cane - single point;Bedside commode;Grab bars - tub/shower;Grab bars - toilet;Shower seat          Prior Functioning/Environment Level of Independence: Needs assistance        Comments: uses cane or rollator, assist for shower; wife assists on one side up steps    OT Diagnosis: Generalized weakness   OT Problem List: Decreased strength;Decreased activity tolerance;Impaired balance (sitting and/or standing);Decreased cognition;Decreased safety awareness;Decreased knowledge of use of DME or AE;Decreased knowledge of precautions   OT Treatment/Interventions: Self-care/ADL training;Energy conservation;DME and/or AE instruction;Therapeutic activities;Patient/family education;Balance training    OT Goals(Current goals can be found in the care plan section) Acute Rehab OT Goals Patient Stated Goal: Not be so sick OT Goal Formulation: With patient Time For Goal Achievement: 03/05/15 Potential to Achieve Goals: Good ADL Goals Pt Will Perform Grooming: with  supervision;standing Pt Will Perform Upper Body Bathing: with supervision;sitting Pt Will Perform Lower Body Bathing: with supervision;sit to/from stand Pt Will Transfer to Toilet: with supervision;ambulating;bedside commode (over toilet) Pt Will Perform Toileting - Clothing Manipulation and hygiene: with supervision;sit to/from stand  OT Frequency: Min 2X/week   Barriers to D/C:            Co-evaluation              End of Session Equipment Utilized During Treatment: Rolling walker;Oxygen  Activity Tolerance: Patient tolerated treatment well;Patient limited by fatigue Patient left: in bed;with call bell/phone within reach;with bed alarm set   Time: 1440-1514 OT Time Calculation (min): 34 min Charges:  OT General Charges $OT Visit: 1 Procedure OT Evaluation $OT Eval Moderate Complexity: 1 Procedure OT Treatments $Self Care/Home Management : 8-22 mins G-Codes:     Binnie Kand M.S., OTR/L Pager: [phone]  02/19/2015, 5:21 PM

## 2015-02-19 NOTE — Progress Notes (Signed)
Pt was questionable about DNR status.  RN explained what DNR status meant.  Pt stated he wanted some interventions but nothing extreme.  RN asked pt what he considered extreme and pt stated "I don't want to discuss this right now, I would like to talk with my wife first".  Will have day shift nurse follow up.     Fredrich Romans, RN

## 2015-02-20 ENCOUNTER — Inpatient Hospital Stay (HOSPITAL_COMMUNITY): Payer: Medicare Other

## 2015-02-20 MED ORDER — FUROSEMIDE 40 MG PO TABS
40.0000 mg | ORAL_TABLET | Freq: Two times a day (BID) | ORAL | Status: DC
  Administered 2015-02-21 – 2015-02-22 (×3): 40 mg via ORAL
  Filled 2015-02-20 (×3): qty 1

## 2015-02-20 MED ORDER — SODIUM CHLORIDE 0.9 % IV BOLUS (SEPSIS)
500.0000 mL | Freq: Once | INTRAVENOUS | Status: AC
  Administered 2015-02-20: 500 mL via INTRAVENOUS

## 2015-02-20 MED ORDER — DOXYCYCLINE HYCLATE 100 MG PO TABS
100.0000 mg | ORAL_TABLET | Freq: Two times a day (BID) | ORAL | Status: DC
  Administered 2015-02-20 – 2015-02-24 (×9): 100 mg via ORAL
  Filled 2015-02-20 (×9): qty 1

## 2015-02-20 MED ORDER — LOPERAMIDE HCL 2 MG PO CAPS
2.0000 mg | ORAL_CAPSULE | Freq: Three times a day (TID) | ORAL | Status: DC
  Administered 2015-02-20 – 2015-02-25 (×14): 2 mg via ORAL
  Filled 2015-02-20 (×14): qty 1

## 2015-02-20 NOTE — Progress Notes (Addendum)
[facility] PROGRESS NOTE  Harold Jennings [id number] DOB: December 08, 1932 DOA: 02/17/2015 PCP: London Pepper, MD  Brief Narrative: 82 year old male who presented with dizziness and slurred speech, he was found to be hypotension at his PCPs office and was sent to the ED. MRI showed nothing acute but CXR showed patchy infiltrate concerning for PNA and CHF. He is on Azithromycin + Rocephin to cover PNA and UTI. Had -1.7L balance after Lasix. He seems to be improving from a respiratory standpoint, has no focal deficits and no hypotension.  Subjective: Feels improved but still with exertional dyspnea. Multiple loose BMs yesterday and today, he notes this is chronic and occurs and typically improves with Imodium.  Assessment/Plan:  CAP (community acquired pneumonia): CXR shows multifocal patchy infiltrates. Lactic acid negative, no leukocytosis. We'll taper to oral doxycycline due to worsening diarrhea. He reports some exertional dyspnea but clinically improved. Wean O2 as able. Influenza PCR negative. Urine Legionella and streptococcal antigen negative. Blood cultures negative .  Acute on chronic systolic CHF: He has been diuresed, currently dizzy with soft blood pressure, reduce Lasix one-time IV fluid bolus and monitor.  TIA vs Mild Encephalopathy: Suspect metabolic encephalopathy and hypotension. MRI negative for acute infarct, multiple old infarcts noted. LDL 56, a1c 4.5. Carotid Doppler <40% stenosis. Echo nonacute. No further hypotension. Speech clear, no dizziness. PT recommending HHPT versus SNF.  Recurrent UTI: has had multiple infections in the past year, most recent urine culture (03/2014) with Klebsiella. No fevers or leukocytosis but pt reported dysuria and increased frequency. Continue ceftriaxone.  Diarrhea, chronic: He is currently having diarrhea, C. difficile Toxin negative, add Imodium at a higher dose.  Chronic A. Fib [place] vasc 2 score of >3: Continue Xarelto. Rate controlled  with Toprol, if blood pressures remain soft with high heart rate will add digoxin with caution. Seen by cardiology.  Chronic kidney disease Stage 3-4: Cr bumped up to 2.5 in 06/2014, and has remained elevated since. Baseline creatinine 1.5. Clinically dehydrated will give gentle bolus and monitor. Lasix dose reduced  Hx of CAD: stable-no chest pain, continue Metoprolol, ASA, LDL 56 no need for statin. 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 Stable for outpatient monitoring and follow up. No evidence of active bleeding.  Hypothyroidism: Continue synthroid  [id number] Flomax/Cardura. Follow  OSA:non compliant to CPAP    Code Status: FULL at present, pt wants to speak with is wife and clarify Family Communication: None at bedside  Disposition Plan: Remains inpatient for IV antibiotics.  DVT PPX: on Xarelto   Consultants:  Neurology  Procedures:  Echo  Left ventricle: The cavity size was mildly dilated. Wallthickness was increased in a pattern of mild LVH. Systolicfunction was mildly to moderately reduced. The estimated ejection fraction was in the range of 40% to 45%. Moderate hypokinesis of the basal-midinferior myocardium. - Aortic valve: There was trivial regurgitation. Valve area (VTI): 2.1 cm^2. Valve area (Vmax): 2.31 cm^2. Valve area (Vmean): 1.83 cm^2. - Left atrium: The atrium was severely dilated. - Right ventricle: The cavity size was mildly dilated. - Right atrium: The atrium was moderately dilated. - Pulmonary arteries: Systolic pressure was mildly to moderately increased. PA peak pressure: 39 mm Hg (S).  Signature  Lala Lund K M.D on 02/20/2015 at 11:43 AM  Between 7am to 7pm - Pager - [phone], After 7pm go to [email or web address] - password [facility]  [facility]  - Office  [phone]      Objective: Filed Vitals:  02/20/15 0500 02/20/15 0928  BP: 110/52 116/66  Pulse: 105  113  Temp: 99.4 F (37.4 C) 100 F (37.8 C)  Resp: 20 18    Intake/Output Summary (Last 24 hours) at 02/20/15 1142 Last data filed at 02/20/15 0516  Gross per 24 hour  Intake    540 ml  Output   2100 ml  Net  -1560 ml   Filed Weights   02/18/15 1301 02/19/15 0500 02/20/15 0500  Weight: 78.019 kg (172 lb) 76.114 kg (167 lb 12.8 oz) 79.153 kg (174 lb 8 oz)    Exam:   General:  Alert, NAD, speech clear  Cardiovascular: irregularly irregular  Respiratory: Few bibasilar rales-but mostly clear, no conversational dyspnea  Abdomen: soft, nontender, non distended  Musculoskeletal: trace BLE edema  Data Reviewed: Basic Metabolic Panel:  Recent Labs Lab 02/17/15 1553 02/18/15 1058 02/19/15 0640  NA 136 138 137  K 4.2 3.9 3.7  CL 108 109 104  CO2 19* 19* 21*  GLUCOSE 111* 138* 103*  BUN 42* 34* 29*  CREATININE 1.98* 1.99* 2.03*  CALCIUM 8.7* 8.0* 8.5*   Liver Function Tests:  Recent Labs Lab 02/17/15 1553  AST 43*  ALT 38  ALKPHOS 216*  BILITOT 1.0  PROT 7.3  ALBUMIN 3.4*   No results for input(s): LIPASE, AMYLASE in the last 168 hours. No results for input(s): AMMONIA in the last 168 hours. CBC:  Recent Labs Lab 02/17/15 1553 02/18/15 1058  WBC 8.1 6.6  NEUTROABS 5.1  --   HGB 10.3* 9.3*  HCT 31.6* 28.4*  MCV 101.9* 101.4*  PLT 193 165   Cardiac Enzymes: No results for input(s): CKTOTAL, CKMB, CKMBINDEX, TROPONINI in the last 168 hours. BNP (last 3 results)  Recent Labs  02/17/15 1553  BNP 820.8*    ProBNP (last 3 results) No results for input(s): PROBNP in the last 8760 hours.  CBG:  Recent Labs Lab 02/17/15 1543 02/18/15 1137  GLUCAP 92 127*    Recent Results (from the past 240 hour(s))  Blood Culture (routine x 2)     Status: None (Preliminary result)   Collection Time: 02/17/15  4:00 PM  Result Value Ref Range Status   Specimen Description BLOOD RIGHT FOREARM  Final   Special Requests BOTTLES DRAWN AEROBIC AND ANAEROBIC  5ML  Final   Culture   Final    NO GROWTH 2 DAYS Performed at [facility]    Report Status PENDING  Incomplete  Urine culture     Status: None   Collection Time: 02/17/15  4:18 PM  Result Value Ref Range Status   Specimen Description URINE, CLEAN CATCH  Final   Special Requests NONE  Final   Culture   Final    MULTIPLE SPECIES PRESENT, SUGGEST RECOLLECTION Performed at [facility]    Report Status 02/18/2015 FINAL  Final  C difficile quick scan w PCR reflex     Status: Abnormal   Collection Time: 02/19/15  5:07 AM  Result Value Ref Range Status   C Diff antigen POSITIVE (A) NEGATIVE Final   C Diff toxin NEGATIVE NEGATIVE Final   C Diff interpretation   Final    C. difficile present, but toxin not detected. This indicates colonization. In most cases, this does not require treatment. If patient has signs and symptoms consistent with colitis, consider treatment. Requires ENTERIC precautions.  MRSA PCR Screening     Status: None   Collection Time: 02/19/15  2:00 PM  Result Value  Ref Range Status   MRSA by PCR NEGATIVE NEGATIVE Final    Comment:        The GeneXpert MRSA Assay (FDA approved for NASAL specimens only), is one component of a comprehensive MRSA colonization surveillance program. It is not intended to diagnose MRSA infection nor to guide or monitor treatment for MRSA infections.   Culture, sputum-assessment     Status: None   Collection Time: 02/19/15  2:21 PM  Result Value Ref Range Status   Specimen Description SPUTUM  Final   Special Requests NONE  Final   Sputum evaluation   Final    MICROSCOPIC FINDINGS SUGGEST THAT THIS SPECIMEN IS NOT REPRESENTATIVE OF LOWER RESPIRATORY SECRETIONS. PLEASE RECOLLECT. RESULT CALLED TO, READ BACK BY AND VERIFIED WITH: H HUNTER 02/19/15 @ 78 M VESTAL    Report Status 02/19/2015 FINAL  Final     Studies: No results found.  Scheduled Meds: . aspirin EC  81 mg Oral Daily  . azithromycin  500 mg Intravenous  Q24H  . cefTRIAXone (ROCEPHIN)  IV  1 g Intravenous Q24H  . doxazosin  8 mg Oral QHS  . ezetimibe  10 mg Oral q morning - 10a  . finasteride  5 mg Oral Daily  . [START ON 02/21/2015] furosemide  40 mg Oral BID  . levothyroxine  100 mcg Oral QAC breakfast  . metoprolol succinate  50 mg Oral Daily  . rivaroxaban  15 mg Oral Q supper  . saccharomyces boulardii  250 mg Oral q morning - 10a  . sertraline  50 mg Oral Daily  . sodium chloride  500 mL Intravenous Once   Continuous Infusions:   Principal Problem:   CAP (community acquired pneumonia) Active Problems:   Dyslipidemia   Essential hypertension   Merkel cell carcinoma (HCC)   Chronic atrial fibrillation (HCC)   BPH (benign prostatic hyperplasia)   Hypothyroidism   UTI (lower urinary tract infection)   Acute on chronic combined systolic and diastolic congestive heart failure (HCC)   Slurred speech   Dizziness   TIA (transient ischemic attack)     LOS: 3 days    Signature  SINGH,PRASHANT K M.D on 02/20/2015 at 11:43 AM  Between 7am to 7pm - Pager - [phone], After 7pm go to [email or web address] - password [facility]  [facility]  - Office  [phone]

## 2015-02-20 NOTE — Procedures (Signed)
Patient dose not want to wear cpap tonight, will inform RT if anything changes. RT will continue to monitor.

## 2015-02-20 NOTE — Progress Notes (Signed)
OOB to chair; cooperative with IS and flutter valve interventions.

## 2015-02-20 NOTE — Progress Notes (Signed)
Stressed with patient the importance of moving and getting out of bed to chair. Encouraged the use od IS and flutter valve q2 and q4 w/a. Assisted patient with nurse to chair. Placed on [place] 2lpm for room air sats of 86%.

## 2015-02-20 NOTE — Progress Notes (Addendum)
Only tolerated the chair for an hour; returned to bed without acute distress; encouraged and patient can demonstrate IS and flutter valve.

## 2015-02-20 NOTE — Progress Notes (Addendum)
Ambulated patient out of bed to the bathroom without 02; sats dropped to 89% with exertion; c/o dizziness; became incontinent of stool on the floor after walking a few feet into the bathroom; will notify MD.  At rest, 02 Sats recovered to 94-96% room air.

## 2015-02-21 ENCOUNTER — Inpatient Hospital Stay (HOSPITAL_COMMUNITY): Payer: Medicare Other

## 2015-02-21 LAB — BASIC METABOLIC PANEL
ANION GAP: 13 (ref 5–15)
BUN: 26 mg/dL — ABNORMAL HIGH (ref 6–20)
CALCIUM: 8.3 mg/dL — AB (ref 8.9–10.3)
CO2: 21 mmol/L — AB (ref 22–32)
CREATININE: 2.01 mg/dL — AB (ref 0.61–1.24)
Chloride: 100 mmol/L — ABNORMAL LOW (ref 101–111)
GFR, EST AFRICAN AMERICAN: 34 mL/min — AB (ref >60)
GFR, EST NON AFRICAN AMERICAN: 30 mL/min — AB (ref >60)
Glucose, Bld: 98 mg/dL (ref 65–99)
Potassium: 3.7 mmol/L (ref 3.5–5.1)
SODIUM: 134 mmol/L — AB (ref 135–145)

## 2015-02-21 LAB — CBC
HCT: 30.1 % — ABNORMAL LOW (ref 39.0–52.0)
Hemoglobin: 10.1 g/dL — ABNORMAL LOW (ref 13.0–17.0)
MCH: 33.8 pg (ref 26.0–34.0)
MCHC: 33.6 g/dL (ref 30.0–36.0)
MCV: 100.7 fL — ABNORMAL HIGH (ref 78.0–100.0)
PLATELETS: 180 10*3/uL (ref 150–400)
RBC: 2.99 MIL/uL — AB (ref 4.22–5.81)
RDW: 16.5 % — ABNORMAL HIGH (ref 11.5–15.5)
WBC: 8.9 10*3/uL (ref 4.0–10.5)

## 2015-02-21 LAB — MAGNESIUM: MAGNESIUM: 1.6 mg/dL — AB (ref 1.7–2.4)

## 2015-02-21 MED ORDER — SODIUM CHLORIDE 0.9 % IV BOLUS (SEPSIS)
500.0000 mL | Freq: Once | INTRAVENOUS | Status: AC
  Administered 2015-02-21: 500 mL via INTRAVENOUS

## 2015-02-21 NOTE — Progress Notes (Signed)
Physical Therapy Treatment Patient Details Name: Harold Jennings MRN: [id number] DOB: 09-May-1932 Today's Date: 02/21/2015    History of Present Illness Harold Jennings is a 82 y.o. male with Past medical history of essential hypertension, chronic A. fib on chronic anticoagulation, coronary disease, sleep apnea on BiPAP daily at bedtime, chronic combined CHF.  Admitted with symptoms of slurred speech, hypotension and vertigo.  Imaging wtih old lacunar infarcts bilat Basal ganglia and R cerebellum, but no acute infarcts.  Found to have CAP/    PT Comments    Pt with near syncopal episode in hallway while ambulating, occurred after only 60' of ambulation with RW and on 2L O2. O2 sats 82%, HR 110, BP 117/68. Pt recovered quickly once sitting but wife relays that he had several such episodes at home before coming to hospital. Changed recommendation to SNF for rehab and pt and wife agreeable. PT will continue to follow.   Follow Up Recommendations  SNF;Supervision/Assistance - 24 hour     Equipment Recommendations  Wheelchair (measurements PT)    Recommendations for Other Services       Precautions / Restrictions Precautions Precautions: Fall Precaution Comments: watch O2 sats Restrictions Weight Bearing Restrictions: No    Mobility  Bed Mobility               General bed mobility comments: pt received in chair  Transfers Overall transfer level: Needs assistance Equipment used: Rolling walker (2 wheeled) Transfers: Sit to/from Stand Sit to Stand: Min guard;Min assist;+2 safety/equipment         General transfer comment: pt able to stand from recliner with min-guard A. However, after pt's O2 sats dropped, required +2 min A for safe SPT  Ambulation/Gait Ambulation/Gait assistance: Min assist Ambulation Distance (Feet): 60 Feet Assistive device: Rolling walker (2 wheeled) Gait Pattern/deviations: Decreased stride length;Step-through pattern Gait velocity: decraesed Gait  velocity interpretation: <1.8 ft/sec, indicative of risk for recurrent falls General Gait Details: pt ambulated on 2L O2, O2 sats 92% before ambulating. Pt ambulated 58' and was told to turn around as it was the more than he has been doing. when he began to turn, he stopped speaking, slumped down, chair brought and he was placed into seated position. O2 sats 82% at that point. He was immediately verbal again once sitting and recovered within 2 mins.    Stairs            Wheelchair Mobility    Modified Rankin (Stroke Patients Only)       Balance Overall balance assessment: Needs assistance Sitting-balance support: No upper extremity supported Sitting balance-[name] Scale: Fair     Standing balance support: Bilateral upper extremity supported Standing balance-[name] Scale: Poor                      Cognition Arousal/Alertness: Awake/alert Behavior During Therapy: WFL for tasks assessed/performed Overall Cognitive Status: Within Functional Limits for tasks assessed                      Exercises      General Comments General comments (skin integrity, edema, etc.): BP 117/68 upon return to room from episode in hallway, RN present throughout session. O2 sats increased from 82% to 92% within 5 mins rest      Pertinent Vitals/Pain Pain Assessment: No/denies pain  See gait    Home Living  Prior Function            PT Goals (current goals can now be found in the care plan section) Acute Rehab PT Goals Patient Stated Goal: get stronger PT Goal Formulation: With patient/family Time For Goal Achievement: 02/25/15 Potential to Achieve Goals: Good Progress towards PT goals: Progressing toward goals    Frequency  Min 3X/week    PT Plan Discharge plan needs to be updated    Co-evaluation             End of Session Equipment Utilized During Treatment: Gait belt;Oxygen Activity Tolerance: Patient limited by  fatigue Patient left: in chair;with family/visitor present;with call bell/phone within reach     Time: 1432-1506 PT Time Calculation (min) (ACUTE ONLY): 34 min  Charges:  $Gait Training: 23-37 mins                    G Codes:     Harold Jennings, PT  Acute Rehab Services  [phone]  Harold Jennings 02/21/2015, 3:49 PM

## 2015-02-21 NOTE — Progress Notes (Signed)
[facility] PROGRESS NOTE  Harold Jennings [id number] DOB: [birth year] DOA: 02/17/2015 PCP: London Pepper, MD  Brief Narrative: 82 year old male who presented with dizziness and slurred speech, he was found to be hypotension at his PCPs office and was sent to the ED. MRI showed nothing acute but CXR showed patchy infiltrate concerning for PNA and CHF. He is on Azithromycin + Rocephin to cover PNA and UTI. Had -1.7L balance after Lasix. He seems to be improving from a respiratory standpoint, has no focal deficits and no hypotension.  Subjective:  Feels improved but still with exertional dyspnea. Multiple loose BMs yesterday and today, he notes this is chronic and occurs and typically improves with Imodium.  Assessment/Plan:  CAP (community acquired pneumonia): CXR shows multifocal patchy infiltrates. Lactic acid negative, no leukocytosis. Put him on 02/20/2015 to oral doxycycline due to worsening diarrhea. He reports some exertional dyspnea but clinically improved. Off o2. Influenza PCR negative. Urine Legionella and streptococcal antigen negative. Blood cultures negative .  Acute on chronic systolic CHF: He has been diuresed, currently dizzy with soft blood pressure, reduce Lasix one-time IV fluid bolus and monitor.  TIA vs Mild Encephalopathy: Suspect metabolic encephalopathy and hypotension. MRI negative for acute infarct, multiple old infarcts noted. LDL 56, a1c 4.5. Carotid Doppler <40% stenosis. Echo nonacute. No further hypotension. Speech clear, no dizziness. PT recommending HHPT versus SNF.  Recurrent UTI: has had multiple infections in the past year, most recent urine culture (03/2014) with Klebsiella. No fevers or leukocytosis but pt reported dysuria and increased frequency. Continue ceftriaxone.  Diarrhea, chronic: He is currently having diarrhea, C. difficile Toxin negative, add Imodium at a higher dose.  Chronic A. Fib [place] vasc 2 score of >3: Continue Xarelto. Rate  controlled with Toprol, if blood pressures remain soft with high heart rate will add digoxin with caution. Seen by cardiology.  Chronic kidney disease Stage 3-4: Cr bumped up to 2.5 in 06/2014, and has remained elevated since. Baseline creatinine 1.5. Clinically dehydrated will give gentle bolus and monitor. Lasix dose reduced  Hx of CAD: stable-no chest pain, continue Metoprolol, ASA, LDL 56 no need for statin. 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 Stable for outpatient monitoring and follow up. No evidence of active bleeding.  Hypothyroidism: Continue synthroid  [id number] Flomax/Cardura. Follow  OSA:non compliant to CPAP    Code Status: FULL at present, pt wants to speak with is wife and clarify Family Communication: None at bedside  Disposition Plan: SNF vs HHPT in am DVT PPX: on Xarelto   Consultants:  Neurology  Procedures:  Echo  Left ventricle: The cavity size was mildly dilated. Wallthickness was increased in a pattern of mild LVH. Systolicfunction was mildly to moderately reduced. The estimated ejection fraction was in the range of 40% to 45%. Moderate hypokinesis of the basal-midinferior myocardium. - Aortic valve: There was trivial regurgitation. Valve area (VTI): 2.1 cm^2. Valve area (Vmax): 2.31 cm^2. Valve area (Vmean): 1.83 cm^2. - Left atrium: The atrium was severely dilated. - Right ventricle: The cavity size was mildly dilated. - Right atrium: The atrium was moderately dilated. - Pulmonary arteries: Systolic pressure was mildly to moderately increased. PA peak pressure: 39 mm Hg (S).  Signature  Lala Lund K M.D on 02/21/2015 at 11:47 AM  Between 7am to 7pm - Pager - [phone], After 7pm go to [email or web address] - password [facility]  [facility]  - Office  [phone]      Objective: Filed Vitals:  02/21/15 0620 02/21/15 1038  BP: 115/53 119/75  Pulse: 108 111  Temp:  98.6 F (37 C) 98.4 F (36.9 C)  Resp: 20 20    Intake/Output Summary (Last 24 hours) at 02/21/15 1147 Last data filed at 02/21/15 0731  Gross per 24 hour  Intake      0 ml  Output   1600 ml  Net  -1600 ml   Filed Weights   02/18/15 1301 02/19/15 0500 02/20/15 0500  Weight: 78.019 kg (172 lb) 76.114 kg (167 lb 12.8 oz) 79.153 kg (174 lb 8 oz)    Exam:   General:  Alert, NAD, speech clear  Cardiovascular: irregularly irregular  Respiratory: Few bibasilar rales-but mostly clear, no conversational dyspnea  Abdomen: soft, nontender, non distended  Musculoskeletal: trace BLE edema  Data Reviewed: Basic Metabolic Panel:  Recent Labs Lab 02/17/15 1553 02/18/15 1058 02/19/15 0640 02/21/15 0218  NA 136 138 137 134*  K 4.2 3.9 3.7 3.7  CL 108 109 104 100*  CO2 19* 19* 21* 21*  GLUCOSE 111* 138* 103* 98  BUN 42* 34* 29* 26*  CREATININE 1.98* 1.99* 2.03* 2.01*  CALCIUM 8.7* 8.0* 8.5* 8.3*  MG  --   --   --  1.6*   Liver Function Tests:  Recent Labs Lab 02/17/15 1553  AST 43*  ALT 38  ALKPHOS 216*  BILITOT 1.0  PROT 7.3  ALBUMIN 3.4*   No results for input(s): LIPASE, AMYLASE in the last 168 hours. No results for input(s): AMMONIA in the last 168 hours. CBC:  Recent Labs Lab 02/17/15 1553 02/18/15 1058 02/21/15 0218  WBC 8.1 6.6 8.9  NEUTROABS 5.1  --   --   HGB 10.3* 9.3* 10.1*  HCT 31.6* 28.4* 30.1*  MCV 101.9* 101.4* 100.7*  PLT 193 165 180   Cardiac Enzymes: No results for input(s): CKTOTAL, CKMB, CKMBINDEX, TROPONINI in the last 168 hours. BNP (last 3 results)  Recent Labs  02/17/15 1553  BNP 820.8*    ProBNP (last 3 results) No results for input(s): PROBNP in the last 8760 hours.  CBG:  Recent Labs Lab 02/17/15 1543 02/18/15 1137  GLUCAP 92 127*    Recent Results (from the past 240 hour(s))  Blood Culture (routine x 2)     Status: None (Preliminary result)   Collection Time: 02/17/15  4:00 PM  Result Value Ref Range  Status   Specimen Description BLOOD RIGHT FOREARM  Final   Special Requests BOTTLES DRAWN AEROBIC AND ANAEROBIC 5ML  Final   Culture   Final    NO GROWTH 3 DAYS Performed at [facility]    Report Status PENDING  Incomplete  Urine culture     Status: None   Collection Time: 02/17/15  4:18 PM  Result Value Ref Range Status   Specimen Description URINE, CLEAN CATCH  Final   Special Requests NONE  Final   Culture   Final    MULTIPLE SPECIES PRESENT, SUGGEST RECOLLECTION Performed at [name]    Report Status 02/18/2015 FINAL  Final  C difficile quick scan w PCR reflex     Status: Abnormal   Collection Time: 02/19/15  5:07 AM  Result Value Ref Range Status   C Diff antigen POSITIVE (A) NEGATIVE Final   C Diff toxin NEGATIVE NEGATIVE Final   C Diff interpretation   Final    C. difficile present, but toxin not detected. This indicates colonization. In most cases, this does not require treatment. If  patient has signs and symptoms consistent with colitis, consider treatment. Requires ENTERIC precautions.  MRSA PCR Screening     Status: None   Collection Time: 02/19/15  2:00 PM  Result Value Ref Range Status   MRSA by PCR NEGATIVE NEGATIVE Final    Comment:        The GeneXpert MRSA Assay (FDA approved for NASAL specimens only), is one component of a comprehensive MRSA colonization surveillance program. It is not intended to diagnose MRSA infection nor to guide or monitor treatment for MRSA infections.   Culture, sputum-assessment     Status: None   Collection Time: 02/19/15  2:21 PM  Result Value Ref Range Status   Specimen Description SPUTUM  Final   Special Requests NONE  Final   Sputum evaluation   Final    MICROSCOPIC FINDINGS SUGGEST THAT THIS SPECIMEN IS NOT REPRESENTATIVE OF LOWER RESPIRATORY SECRETIONS. PLEASE RECOLLECT. RESULT CALLED TO, READ BACK BY AND VERIFIED WITH: H HUNTER 02/19/15 @ 19 M VESTAL    Report Status 02/19/2015 FINAL  Final      Studies: Dg Chest 2 View  02/21/2015  CLINICAL DATA:  Shortness of breath, fever. EXAM: CHEST  2 VIEW COMPARISON:  February 20, 2015. FINDINGS: Stable cardiomegaly. No pneumothorax is noted. Stable left midlung and bibasilar opacities are noted concerning for pneumonia. Increased left upper lobe opacity is noted concerning for possible pneumonia. Minimal bilateral pleural effusions are noted. Bony thorax is unremarkable. IMPRESSION: Stable left midlung in bibasilar opacities concerning for pneumonia. Increased left upper lobe opacity is noted concerning for pneumonia. Electronically Signed   By: Marijo Conception, M.D.   On: 02/21/2015 08:20   Dg Chest Port 1 View  02/20/2015  CLINICAL DATA:  Shortness of breath. EXAM: PORTABLE CHEST 1 VIEW COMPARISON:  February 17, 2015. FINDINGS: Stable cardiomediastinal silhouette. No pneumothorax is noted. Stable lingular and bibasilar opacities are noted concerning for pneumonia. Bony thorax is intact. IMPRESSION: Stable bilateral lung opacities consistent with multifocal pneumonia. Electronically Signed   By: Marijo Conception, M.D.   On: 02/20/2015 12:08    Scheduled Meds: . aspirin EC  81 mg Oral Daily  . doxazosin  8 mg Oral QHS  . doxycycline  100 mg Oral Q12H  . ezetimibe  10 mg Oral q morning - 10a  . finasteride  5 mg Oral Daily  . furosemide  40 mg Oral BID  . levothyroxine  100 mcg Oral QAC breakfast  . loperamide  2 mg Oral TID  . metoprolol succinate  50 mg Oral Daily  . rivaroxaban  15 mg Oral Q supper  . saccharomyces boulardii  250 mg Oral q morning - 10a  . sertraline  50 mg Oral Daily   Continuous Infusions:   Principal Problem:   CAP (community acquired pneumonia) Active Problems:   Dyslipidemia   Essential hypertension   Merkel cell carcinoma (HCC)   Chronic atrial fibrillation (HCC)   BPH (benign prostatic hyperplasia)   Hypothyroidism   UTI (lower urinary tract infection)   Acute on chronic combined systolic and diastolic  congestive heart failure (HCC)   Slurred speech   Dizziness   TIA (transient ischemic attack)     LOS: 4 days    Signature  Lael Wetherbee K M.D on 02/21/2015 at 11:47 AM  Between 7am to 7pm - Pager - [phone], After 7pm go to [email or web address] - password [facility]  [facility]  - Office  [phone]

## 2015-02-21 NOTE — Progress Notes (Signed)
Knee high ted hose applied; Normal Saline 500 ml bolus administered to patient per MD order.

## 2015-02-21 NOTE — Procedures (Signed)
Pt does not wish to wear cpap tonight will inform RT if anything changes. 

## 2015-02-21 NOTE — Progress Notes (Signed)
PT waking from a nap- RT and PT discussed the benefits of utilizing his Flutter. He states he has been using and will continue- feeling tired at this time.

## 2015-02-21 NOTE — Progress Notes (Addendum)
Occupational Therapy Treatment Patient Details Name: Harold Jennings MRN: [id number] DOB: 01/14/1933 Today's Date: 02/21/2015    History of present illness Harold Jennings is a 82 y.o. male with Past medical history of essential hypertension, chronic A. fib on chronic anticoagulation, coronary disease, sleep apnea on BiPAP daily at bedtime, chronic combined CHF.  Admitted with symptoms of slurred speech, hypotension and vertigo.  Imaging wtih old lacunar infarcts bilat Basal ganglia and R cerebellum, but no acute infarcts.  Found to have CAP.   OT comments  Pt progressing. Education provided in session. Updated d/c plan.  Follow Up Recommendations  SNF    Equipment Recommendations  None recommended by OT    Recommendations for Other Services      Precautions / Restrictions Precautions Precautions: Fall Precaution Comments: watch O2 sats Restrictions Weight Bearing Restrictions: No       Mobility Bed Mobility               General bed mobility comments: not assessed  Transfers Overall transfer level: Needs assistance Equipment used: Rolling walker (2 wheeled) Transfers: Sit to/from Stand Sit to Stand: Min guard      Comments: cues for hand placement        Balance    Used RW for ambulation-Min guard. Able to perform functional task while standing without LOB-RW/Sink in front of pt.                               ADL Overall ADL's : Needs assistance/impaired     Grooming: Oral care;Set up;Supervision/safety;Standing       Lower Body Bathing: Set up;Supervison/ safety;Sit to/from stand       Lower Body Dressing: Set up;Sitting/lateral leans;Supervision/safety (donned shoes)   Toilet Transfer: Min guard;Ambulation;RW (sit to stand from chair)           Functional mobility during ADLs: Rolling walker;Min guard General ADL Comments: Educated on energy conservation and deep breathing technique.        Vision                      Perception     Praxis      Cognition  Awake/Alert Behavior During Therapy: WFL for tasks assessed/performed Overall Cognitive Status: Within Functional Limits for tasks assessed                       Extremity/Trunk Assessment               Exercises     Shoulder Instructions       General Comments      Pertinent Vitals/ Pain       Pain Assessment: No/denies pain; O2 sats read in 70s on pulse oximeter (unsure of accuracy) towards beginning of session and trended up some and then went back down. Placed pt on 2L of O2 and left on for remaining of session.   Home Living                                          Prior Functioning/Environment              Frequency Min 2X/week     Progress Toward Goals  OT Goals(current goals can now be found in the care plan section)  Progress towards OT  goals: Progressing toward goals  Acute Rehab OT Goals Patient Stated Goal: not stated OT Goal Formulation: With patient Time For Goal Achievement: 03/05/15 Potential to Achieve Goals: Good ADL Goals Pt Will Perform Grooming: with supervision;standing Pt Will Perform Upper Body Bathing: with supervision;sitting Pt Will Perform Lower Body Bathing: with supervision;sit to/from stand Pt Will Transfer to Toilet: with supervision;ambulating;bedside commode (over toilet) Pt Will Perform Toileting - Clothing Manipulation and hygiene: with supervision;sit to/from stand  Plan Discharge plan needs to be updated    Co-evaluation                 End of Session Equipment Utilized During Treatment: Rolling walker;Gait belt;Oxygen   Activity Tolerance Patient tolerated treatment well   Patient Left in chair;with call bell/phone within reach;with family/visitor present   Nurse Communication Mobility status;Other (comment) (O2 sats)        Time: 1354-1416 (approximately 5 minutes getting O2 tank and calling to see if tech would bring O2  extender) OT Time Calculation (min): 22 min  Charges: OT General Charges $OT Visit: 1 Procedure OT Treatments $Self Care/Home Management : 8-22 mins  Benito Mccreedy OTR/L [id number] 02/21/2015, 2:43 PM

## 2015-02-22 LAB — CULTURE, BLOOD (ROUTINE X 2): CULTURE: NO GROWTH

## 2015-02-22 MED ORDER — DEXTROSE 5 % IV SOLN
1.0000 g | Freq: Once | INTRAVENOUS | Status: AC
  Administered 2015-02-22: 1 g via INTRAVENOUS
  Filled 2015-02-22: qty 10

## 2015-02-22 NOTE — Clinical Social Work Placement (Signed)
   CLINICAL SOCIAL WORK PLACEMENT  NOTE  Date:  02/22/2015  Patient Details  Name: Harold Jennings MRN: [id number] Date of Birth: November 19, 1932  Clinical Social Work is seeking post-discharge placement for this patient at the [place] level of care (*CSW will initial, date and re-position this form in  chart as items are completed):  Yes   Patient/family provided with [place] Work Department's list of facilities offering this level of care within the geographic area requested by the patient (or if unable, by the patient's family).  Yes   Patient/family informed of their freedom to choose among providers that offer the needed level of care, that participate in Medicare, Medicaid or managed care program needed by the patient, have an available bed and are willing to accept the patient.  Yes   Patient/family informed of [place]'s ownership interest in [facility] and [facility], as well as of the fact that they are under no obligation to receive care at these facilities.  PASRR submitted to EDS on       PASRR number received on       Existing PASRR number confirmed on 02/22/15     FL2 transmitted to all facilities in geographic area requested by pt/family on 02/22/15     FL2 transmitted to all facilities within larger geographic area on       Patient informed that his/her managed care company has contracts with or will negotiate with certain facilities, including the following:        Yes   Patient/family informed of bed offers received.  Patient chooses bed at  ([facility] )     Physician recommends and patient chooses bed at      Patient to be transferred to  ([facility] ) on  .  Patient to be transferred to facility by  [facility] )     Patient family notified on   of transfer.  Name of family member notified:   (Pt's wife, [name])     PHYSICIAN Please sign FL2, Please prepare priority discharge  summary, including medications     Additional Comment:    _______________________________________________ Rozell Searing, LCSW 02/22/2015, 2:02 PM

## 2015-02-22 NOTE — NC FL2 (Signed)
[place] LEVEL OF CARE SCREENING TOOL     IDENTIFICATION  Patient Name: Harold Jennings Birthdate: 1932-07-28 Sex: male Admission Date (Current Location): 02/17/2015  [facility] and [place] Number:  Herbalist and Address:  [facility]. [facility], [place] [address], [place], [place] [address]      Provider Number: [id number]  Attending Physician Name and Address:  Thurnell Lose, MD  Relative Name and Phone Number:       Current Level of Care: Hospital Recommended Level of Care: [name] Prior Approval Number:    Date Approved/Denied:   PASRR Number: [id number]  Discharge Plan: SNF    Current Diagnoses: Patient Active Problem List   Diagnosis Date Noted  . TIA (transient ischemic attack)   . UTI (lower urinary tract infection) 02/17/2015  . CAP (community acquired pneumonia) 02/17/2015  . Acute on chronic combined systolic and diastolic congestive heart failure ([place]) 02/17/2015  . Slurred speech 02/17/2015  . Dizziness 02/17/2015  . ARF (acute renal failure) ([place]) 07/12/2014  . BPH (benign prostatic hyperplasia) 07/12/2014  . Fall 07/12/2014  . Hypothyroidism 07/12/2014  . Colovesical fistula 06/11/2014  . Chronic atrial fibrillation ([place]) 01/14/2014  . Chronic combined systolic and diastolic congestive heart failure ([place]) 01/14/2014  . Merkel cell carcinoma ([place]) 06/30/2013  . Dyslipidemia 05/23/2008  . Essential hypertension 05/23/2008    Orientation RESPIRATION BLADDER Height & Weight     Self, Time, Situation, Place  Normal Continent Weight: 174 lb 8 oz (79.153 kg) Height:  5\' 8"  (172.7 cm)  BEHAVIORAL SYMPTOMS/MOOD NEUROLOGICAL BOWEL NUTRITION STATUS   (NONE )  (NONE ) Continent Diet (HEART HEALTHY )  AMBULATORY STATUS COMMUNICATION OF NEEDS Skin   Limited Assist Verbally Normal                       Personal Care Assistance Level of Assistance  Bathing, Dressing Bathing Assistance: Limited  assistance   Dressing Assistance: Limited assistance     Functional Limitations Info   (NONE )          SPECIAL CARE FACTORS FREQUENCY  PT (By licensed PT), OT (By licensed OT)     PT Frequency: 3 OT Frequency: 2            Contractures      Additional Factors Info  Code Status, Allergies Code Status Info: FULL CODE  Allergies Info: OXYBUTYNIN           Current Medications (02/22/2015):  This is the current hospital active medication list Current Facility-Administered Medications  Medication Dose Route Frequency Provider Last Rate Last Dose  . albuterol (PROVENTIL) (2.5 MG/3ML) 0.083% nebulizer solution 2.5 mg  2.5 mg Nebulization Q2H PRN Jonetta Osgood, MD      . aspirin EC tablet 81 mg  81 mg Oral Daily Donzetta Starch, NP   81 mg at 02/22/15 0923  . doxazosin (CARDURA) tablet 8 mg  8 mg Oral QHS Jonetta Osgood, MD   8 mg at 02/21/15 2208  . doxycycline (VIBRA-TABS) tablet 100 mg  100 mg Oral Q12H Thurnell Lose, MD   100 mg at 02/22/15 [id number]  . ezetimibe (ZETIA) tablet 10 mg  10 mg Oral q morning - 10a Lavina Hamman, MD   10 mg at 02/22/15 [id number]  . finasteride (PROSCAR) tablet 5 mg  5 mg Oral Daily Lavina Hamman, MD   5 mg at 02/22/15 [id number]  .  furosemide (LASIX) tablet 40 mg  40 mg Oral BID Thurnell Lose, MD   40 mg at 02/22/15 0835  . levothyroxine (SYNTHROID, LEVOTHROID) tablet 100 mcg  100 mcg Oral QAC breakfast Lavina Hamman, MD   100 mcg at 02/22/15 0835  . loperamide (IMODIUM) capsule 2 mg  2 mg Oral TID Thurnell Lose, MD   2 mg at 02/22/15 [id number]  . metoprolol succinate (TOPROL-XL) 24 hr tablet 50 mg  50 mg Oral Daily Jonetta Osgood, MD   50 mg at 02/22/15 [id number]  . Rivaroxaban (XARELTO) tablet 15 mg  15 mg Oral Q supper Polly Cobia, RPH   15 mg at 02/21/15 [id number]  . saccharomyces boulardii (FLORASTOR) capsule 250 mg  250 mg Oral q morning - 10a Lavina Hamman, MD   250 mg at 02/22/15 [id number]  . sertraline (ZOLOFT) tablet 50 mg  50 mg Oral Daily Lavina Hamman, MD   50 mg at 02/22/15 [id number]     Discharge Medications: Please see discharge summary for a list of discharge medications.  Relevant Imaging Results:  Relevant Lab Results:   Additional Information SSN [id number]  Rozell Searing, LCSW

## 2015-02-22 NOTE — Progress Notes (Signed)
[facility] PROGRESS NOTE  COULTON KOH [id number] DOB: July 17, 1932 DOA: 02/17/2015 PCP: London Pepper, MD  Brief Narrative: 82 year old male who presented with dizziness and slurred speech, he was found to be hypotension at his PCPs office and was sent to the ED. MRI showed nothing acute but CXR showed patchy infiltrate concerning for PNA and CHF. He is on Azithromycin + Rocephin to cover PNA and UTI. Had -1.7L balance after Lasix. He seems to be improving from a respiratory standpoint, has no focal deficits and no hypotension.  Subjective:  Feels improved but still with exertional dyspnea. Multiple loose BMs yesterday and today, he notes this is chronic and occurs and typically improves with Imodium.  Assessment/Plan:  CAP (community acquired pneumonia): CXR shows multifocal patchy infiltrates. Lactic acid negative, no leukocytosis. Put him on 02/20/2015 to oral doxycycline due to worsening diarrhea. He reports some exertional dyspnea but clinically improved. Off o2. Influenza PCR negative. Urine Legionella and streptococcal antigen negative. Blood cultures negative .  Acute on chronic systolic CHF: He has been diuresed, currently dizzy with soft blood pressure, reduce Lasix one-time IV fluid bolus and monitor.  TIA vs Mild Encephalopathy: Suspect metabolic encephalopathy and hypotension. MRI negative for acute infarct, multiple old infarcts noted. LDL 56, a1c 4.5. Carotid Doppler <40% stenosis. Echo nonacute. No further hypotension. Speech clear, no dizziness. PT recommending HHPT versus SNF.  Recurrent UTI: has had multiple infections in the past year, most recent urine culture (03/2014) with Klebsiella. No fevers or leukocytosis but pt reported dysuria and increased frequency. Continue ceftriaxone.  Diarrhea, chronic: He is currently having diarrhea, C. difficile Toxin negative, add Imodium at a higher dose.  Chronic A. Fib [place] vasc 2 score of >3: Continue Xarelto. Rate  controlled with Toprol, if blood pressures remain soft with high heart rate will add digoxin with caution. Seen by cardiology.  Chronic kidney disease Stage 3-4: Cr bumped up to 2.5 in 06/2014, and has remained elevated since. Baseline creatinine 1.5. Clinically dehydrated will give gentle bolus and monitor. Lasix dose reduced  Hx of CAD: stable-no chest pain, continue Metoprolol, ASA, LDL 56 no need for statin. 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 Stable for outpatient monitoring and follow up. No evidence of active bleeding.  Hypothyroidism: Continue synthroid  [id number] Flomax/Cardura. Follow  OSA:non compliant to CPAP    Code Status: FULL at present, pt wants to speak with is wife and clarify Family Communication: None at bedside  Disposition Plan: SNF vs HHPT in am DVT PPX: on Xarelto   Consultants:  Neurology  Procedures:  Echo  Left ventricle: The cavity size was mildly dilated. Wallthickness was increased in a pattern of mild LVH. Systolicfunction was mildly to moderately reduced. The estimated ejection fraction was in the range of 40% to 45%. Moderate hypokinesis of the basal-midinferior myocardium. - Aortic valve: There was trivial regurgitation. Valve area (VTI): 2.1 cm^2. Valve area (Vmax): 2.31 cm^2. Valve area (Vmean): 1.83 cm^2. - Left atrium: The atrium was severely dilated. - Right ventricle: The cavity size was mildly dilated. - Right atrium: The atrium was moderately dilated. - Pulmonary arteries: Systolic pressure was mildly to moderately increased. PA peak pressure: 39 mm Hg (S).  Signature  Lala Lund K M.D on 02/22/2015 at 11:55 AM  Between 7am to 7pm - Pager - [phone], After 7pm go to [email or web address] - password [facility]  [facility]  - Office  [phone]      Objective: Filed Vitals:  02/22/15 0621 02/22/15 0914  BP: 123/50 117/61  Pulse: 97 96  Temp:  98.6 F (37 C) 99.5 F (37.5 C)  Resp: 18 18    Intake/Output Summary (Last 24 hours) at 02/22/15 1155 Last data filed at 02/22/15 [id number]  Gross per 24 hour  Intake      0 ml  Output    650 ml  Net   -650 ml   Filed Weights   02/18/15 1301 02/19/15 0500 02/20/15 0500  Weight: 78.019 kg (172 lb) 76.114 kg (167 lb 12.8 oz) 79.153 kg (174 lb 8 oz)    Exam:   General:  Alert, NAD, speech clear  Cardiovascular: irregularly irregular  Respiratory: Few bibasilar rales-but mostly clear, no conversational dyspnea  Abdomen: soft, nontender, non distended  Musculoskeletal: trace BLE edema  Data Reviewed: Basic Metabolic Panel:  Recent Labs Lab 02/17/15 1553 02/18/15 1058 02/19/15 0640 02/21/15 0218  NA 136 138 137 134*  K 4.2 3.9 3.7 3.7  CL 108 109 104 100*  CO2 19* 19* 21* 21*  GLUCOSE 111* 138* 103* 98  BUN 42* 34* 29* 26*  CREATININE 1.98* 1.99* 2.03* 2.01*  CALCIUM 8.7* 8.0* 8.5* 8.3*  MG  --   --   --  1.6*   Liver Function Tests:  Recent Labs Lab 02/17/15 1553  AST 43*  ALT 38  ALKPHOS 216*  BILITOT 1.0  PROT 7.3  ALBUMIN 3.4*   No results for input(s): LIPASE, AMYLASE in the last 168 hours. No results for input(s): AMMONIA in the last 168 hours. CBC:  Recent Labs Lab 02/17/15 1553 02/18/15 1058 02/21/15 0218  WBC 8.1 6.6 8.9  NEUTROABS 5.1  --   --   HGB 10.3* 9.3* 10.1*  HCT 31.6* 28.4* 30.1*  MCV 101.9* 101.4* 100.7*  PLT 193 165 180   Cardiac Enzymes: No results for input(s): CKTOTAL, CKMB, CKMBINDEX, TROPONINI in the last 168 hours. BNP (last 3 results)  Recent Labs  02/17/15 1553  BNP 820.8*    ProBNP (last 3 results) No results for input(s): PROBNP in the last 8760 hours.  CBG:  Recent Labs Lab 02/17/15 1543 02/18/15 1137  GLUCAP 92 127*    Recent Results (from the past 240 hour(s))  Blood Culture (routine x 2)     Status: None (Preliminary result)   Collection Time: 02/17/15  4:00 PM  Result Value Ref Range  Status   Specimen Description BLOOD RIGHT FOREARM  Final   Special Requests BOTTLES DRAWN AEROBIC AND ANAEROBIC 5ML  Final   Culture   Final    NO GROWTH 4 DAYS Performed at [facility]    Report Status PENDING  Incomplete  Urine culture     Status: None   Collection Time: 02/17/15  4:18 PM  Result Value Ref Range Status   Specimen Description URINE, CLEAN CATCH  Final   Special Requests NONE  Final   Culture   Final    MULTIPLE SPECIES PRESENT, SUGGEST RECOLLECTION Performed at [facility]    Report Status 02/18/2015 FINAL  Final  C difficile quick scan w PCR reflex     Status: Abnormal   Collection Time: 02/19/15  5:07 AM  Result Value Ref Range Status   C Diff antigen POSITIVE (A) NEGATIVE Final   C Diff toxin NEGATIVE NEGATIVE Final   C Diff interpretation   Final    C. difficile present, but toxin not detected. This indicates colonization. In most cases, this does not require  treatment. If patient has signs and symptoms consistent with colitis, consider treatment. Requires ENTERIC precautions.  MRSA PCR Screening     Status: None   Collection Time: 02/19/15  2:00 PM  Result Value Ref Range Status   MRSA by PCR NEGATIVE NEGATIVE Final    Comment:        The GeneXpert MRSA Assay (FDA approved for NASAL specimens only), is one component of a comprehensive MRSA colonization surveillance program. It is not intended to diagnose MRSA infection nor to guide or monitor treatment for MRSA infections.   Culture, sputum-assessment     Status: None   Collection Time: 02/19/15  2:21 PM  Result Value Ref Range Status   Specimen Description SPUTUM  Final   Special Requests NONE  Final   Sputum evaluation   Final    MICROSCOPIC FINDINGS SUGGEST THAT THIS SPECIMEN IS NOT REPRESENTATIVE OF LOWER RESPIRATORY SECRETIONS. PLEASE RECOLLECT. RESULT CALLED TO, READ BACK BY AND VERIFIED WITH: H HUNTER 02/19/15 @ 31 M VESTAL    Report Status 02/19/2015 FINAL  Final      Studies: Dg Chest 2 View  02/21/2015  CLINICAL DATA:  Shortness of breath, fever. EXAM: CHEST  2 VIEW COMPARISON:  February 20, 2015. FINDINGS: Stable cardiomegaly. No pneumothorax is noted. Stable left midlung and bibasilar opacities are noted concerning for pneumonia. Increased left upper lobe opacity is noted concerning for possible pneumonia. Minimal bilateral pleural effusions are noted. Bony thorax is unremarkable. IMPRESSION: Stable left midlung in bibasilar opacities concerning for pneumonia. Increased left upper lobe opacity is noted concerning for pneumonia. Electronically Signed   By: Marijo Conception, M.D.   On: 02/21/2015 08:20   Dg Chest Port 1 View  02/20/2015  CLINICAL DATA:  Shortness of breath. EXAM: PORTABLE CHEST 1 VIEW COMPARISON:  February 17, 2015. FINDINGS: Stable cardiomediastinal silhouette. No pneumothorax is noted. Stable lingular and bibasilar opacities are noted concerning for pneumonia. Bony thorax is intact. IMPRESSION: Stable bilateral lung opacities consistent with multifocal pneumonia. Electronically Signed   By: Marijo Conception, M.D.   On: 02/20/2015 12:08    Scheduled Meds: . aspirin EC  81 mg Oral Daily  . doxazosin  8 mg Oral QHS  . doxycycline  100 mg Oral Q12H  . ezetimibe  10 mg Oral q morning - 10a  . finasteride  5 mg Oral Daily  . furosemide  40 mg Oral BID  . levothyroxine  100 mcg Oral QAC breakfast  . loperamide  2 mg Oral TID  . metoprolol succinate  50 mg Oral Daily  . rivaroxaban  15 mg Oral Q supper  . saccharomyces boulardii  250 mg Oral q morning - 10a  . sertraline  50 mg Oral Daily   Continuous Infusions:   Principal Problem:   CAP (community acquired pneumonia) Active Problems:   Dyslipidemia   Essential hypertension   Merkel cell carcinoma (HCC)   Chronic atrial fibrillation (HCC)   BPH (benign prostatic hyperplasia)   Hypothyroidism   UTI (lower urinary tract infection)   Acute on chronic combined systolic and diastolic  congestive heart failure (HCC)   Slurred speech   Dizziness   TIA (transient ischemic attack)     LOS: 5 days    Signature  Beau Ramsburg K M.D on 02/22/2015 at 11:55 AM  Between 7am to 7pm - Pager - [phone], After 7pm go to [email or web address] - password [facility]  [facility]  - Office  [phone]

## 2015-02-22 NOTE — Clinical Social Work Note (Signed)
Per MD, patient is NOT medically stable for discharge today, 2/6. Clinical Social Worker spoke with patient's wife and reference to post-acute placement for SNF. CSW also reviewed and presented SNF list with bed offers. Pt and wife agreeable to SNF placement and chooses bed at [facility]. [facility] prepared to take patient tomorrow, 2/7 and will complete admissions paperwork at time of discharge. Wife notified.   No further concerns reported at this time. CSW will continue to follow pt and pt's family for continued support and to facilitate pt's discharge needs once medically stable.   Glendon Axe, MSW, [place] [phone] 02/22/2015 2:01 PM

## 2015-02-22 NOTE — Progress Notes (Signed)
Physical Therapy Treatment Patient Details Name: MARLON DEGRAAF MRN: [id number] DOB: 1932-10-03 Today's Date: 02/22/2015    History of Present Illness MARGUERITE MEDINE is a 82 y.o. male with Past medical history of essential hypertension, chronic A. fib on chronic anticoagulation, coronary disease, sleep apnea on BiPAP daily at bedtime, chronic combined CHF.  Admitted with symptoms of slurred speech, hypotension and vertigo.  Imaging wtih old lacunar infarcts bilat Basal ganglia and R cerebellum, but no acute infarcts.  Found to have CAP/    PT Comments    Patient continues to have symptomatic orthostasis with supine to sit to stand (See vitals flow sheet). Encouraged ROM exercises, remaining seated in upright position, and drinking fluids (as allowed). Patient and wife expressed uncertainty and a bit of frustration with why his BP is now too low when he previously has had high BP. Answered questions to the best of my ability and referred them to speak with the physician.    Follow Up Recommendations  SNF;Supervision/Assistance - 24 hour     Equipment Recommendations   (TBA at next venue)    Recommendations for Other Services       Precautions / Restrictions Precautions Precautions: Fall Precaution Comments: watch O2 sats and BP Restrictions Weight Bearing Restrictions: No    Mobility  Bed Mobility Overal bed mobility: Modified Independent                Transfers Overall transfer level: Needs assistance Equipment used: None Transfers: Sit to/from Stand Sit to Stand: Min guard         General transfer comment: stood at [facility]; waited for him to feel less woozy and transferred to recliner  Ambulation/Gait             General Gait Details: unable due to symptomatic orthostasis   Stairs            Wheelchair Mobility    Modified Rankin (Stroke Patients Only)       Balance     Sitting balance-[name] Scale: Fair     Standing balance support: No  upper extremity supported Standing balance-[name] Scale: Fair                      Cognition Arousal/Alertness: Awake/alert Behavior During Therapy: WFL for tasks assessed/performed Overall Cognitive Status: Within Functional Limits for tasks assessed                      Exercises Other Exercises Other Exercises: hand pumps while seated to incr BP    General Comments        Pertinent Vitals/Pain Pain Assessment: No/denies pain    Home Living                      Prior Function            PT Goals (current goals can now be found in the care plan section) Acute Rehab PT Goals Patient Stated Goal: get stronger Time For Goal Achievement: 02/25/15    Frequency  Min 3X/week    PT Plan Current plan remains appropriate    Co-evaluation             End of Session Equipment Utilized During Treatment: Gait belt;Oxygen Activity Tolerance: Treatment limited secondary to medical complications (Comment) Patient left: in chair;with family/visitor present;with call bell/phone within reach;with chair alarm set     Time: [id number] PT Time Calculation (min) (ACUTE ONLY): 23 min  Charges:  $Therapeutic Activity: 23-37 mins                    G Codes:      Makell Cyr 03/16/2015, 4:29 PM Pager [phone]

## 2015-02-22 NOTE — Progress Notes (Signed)
RN coude cath pt at 1800 with 400cc as a result. Pt complained of severe pain when touching penis and when inserting coude cath. Notified floor RN with results. Urine yellow, cloudy sediment.

## 2015-02-23 MED ORDER — FUROSEMIDE 40 MG PO TABS
40.0000 mg | ORAL_TABLET | Freq: Two times a day (BID) | ORAL | Status: DC
  Filled 2015-02-23: qty 1

## 2015-02-23 MED ORDER — SODIUM CHLORIDE 0.9 % IV BOLUS (SEPSIS)
500.0000 mL | Freq: Once | INTRAVENOUS | Status: AC
  Administered 2015-02-23: 500 mL via INTRAVENOUS

## 2015-02-23 MED ORDER — PHENAZOPYRIDINE HCL 100 MG PO TABS
100.0000 mg | ORAL_TABLET | Freq: Two times a day (BID) | ORAL | Status: AC
  Administered 2015-02-23 – 2015-02-24 (×4): 100 mg via ORAL
  Filled 2015-02-23 (×4): qty 1

## 2015-02-23 MED ORDER — ACETAMINOPHEN 325 MG PO TABS
650.0000 mg | ORAL_TABLET | ORAL | Status: DC | PRN
  Administered 2015-02-23: 650 mg via ORAL
  Filled 2015-02-23 (×2): qty 2

## 2015-02-23 NOTE — Progress Notes (Signed)
Chaplain Note:  Pt is known to me. 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 He and his wife have support of his Diocese and from the clergy at [place] in [place] as well as several Public affairs consultant. 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 This has been understandably difficult for him to lose his independence as a result of his health difficulties. 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   I will continue to follow.  Harold Jennings Director spiritual care services [phone]

## 2015-02-23 NOTE — Clinical Social Work Note (Signed)
Per MD, patient is NOT medically stable for discharge today. Patient to discharge to [facility] and Rehab once medically stable.  CSW remains available as needed.   Glendon Axe, MSW, LCSWA [phone] 02/23/2015 2:35 PM

## 2015-02-23 NOTE — Progress Notes (Signed)
[facility] PROGRESS NOTE  Harold Jennings [id number] DOB: December 09, 1932 DOA: 02/17/2015 PCP: London Pepper, MD  Brief Narrative: 82 year old male who presented with dizziness and slurred speech, he was found to be hypotension at his PCPs office and was sent to the ED. MRI showed nothing acute but CXR showed patchy infiltrate concerning for PNA and CHF. He is on Azithromycin + Rocephin to cover PNA and UTI. Had -1.7L balance after Lasix. He seems to be improving from a respiratory standpoint, has no focal deficits and no hypotension.  Subjective:  Feels improved but still with exertional dyspnea. Multiple loose BMs yesterday and today, he notes this is chronic and occurs and typically improves with Imodium. Complaining of suprapubic fullness and pain.  Assessment/Plan:  CAP (community acquired pneumonia): CXR shows multifocal patchy infiltrates. Lactic acid negative, no leukocytosis. Put him on 02/20/2015 to oral doxycycline due to worsening diarrhea. He reports some exertional dyspnea but clinically improved. Off o2. Influenza PCR negative. Urine Legionella and streptococcal antigen negative. Blood cultures negative .  Acute on chronic systolic CHF: He has been diuresed, currently dizzy with soft blood pressure, reduce Lasix one-time IV fluid bolus and monitor.  TIA vs Mild Encephalopathy: Suspect metabolic encephalopathy and hypotension. MRI negative for acute infarct, multiple old infarcts noted. LDL 56, a1c 4.5. Carotid Doppler <40% stenosis. Echo nonacute. No further hypotension. Speech clear, no dizziness. PT recommending HHPT versus SNF.  Recurrent UTI with urinary retention: has had multiple infections in the past year, most recent urine culture (03/2014) with Klebsiella. No fevers or leukocytosis but pt reported dysuria and increased frequency. Continue ceftriaxone.  Diarrhea, chronic: He is currently having diarrhea, C. difficile Toxin negative, add Imodium at a higher  dose.  Chronic A. Fib [place] vasc 2 score of >3: Continue Xarelto. Rate controlled with Toprol, if blood pressures remain soft with high heart rate will add digoxin with caution. Seen by cardiology.  Chronic kidney disease Stage 3-4: Cr bumped up to 2.5 in 06/2014, and has remained elevated since. Baseline creatinine 1.5. Clinically dehydrated will give gentle bolus and monitor. Lasix dose reduced  Hx of CAD: stable-no chest pain, continue Metoprolol, ASA, LDL 56 no need for statin. 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 Stable for outpatient monitoring and follow up. No evidence of active bleeding.  Hypothyroidism: Continue synthroid  BPH: Stable-continue Flomax/Cardura. Follow  OSA: non compliant to CPAP  Urinary retention due to BPH. Required straight cath 3, [address] Foley, continue BPH medications and follow with Dr. Marella Chimes stated his primary urologist outpatient.    Code Status: FULL at present, pt wants to speak with is wife and clarify Family Communication: None at bedside  Disposition Plan: SNF vs HHPT in am DVT PPX: on Xarelto   Consultants:  Neurology  Procedures:  Echo  Left ventricle: The cavity size was mildly dilated. Wallthickness was increased in a pattern of mild LVH. Systolicfunction was mildly to moderately reduced. The estimated ejection fraction was in the range of 40% to 45%. Moderate hypokinesis of the basal-midinferior myocardium. - Aortic valve: There was trivial regurgitation. Valve area (VTI): 2.1 cm^2. Valve area (Vmax): 2.31 cm^2. Valve area (Vmean): 1.83 cm^2. - Left atrium: The atrium was severely dilated. - Right ventricle: The cavity size was mildly dilated. - Right atrium: The atrium was moderately dilated. - Pulmonary arteries: Systolic pressure was mildly to moderately increased. PA peak pressure: 39 mm Hg (S).  Signature  Thurnell Lose M.D on  02/23/2015 at 12:03 PM  Between  7am to 7pm - Pager - [phone], After 7pm go to [email or web address] - password [facility]  [facility]  - Office  [phone]      Objective: Filed Vitals:   02/23/15 0628 02/23/15 1017  BP: 111/82 110/65  Pulse: 104 88  Temp: 98.1 F (36.7 C) 99.1 F (37.3 C)  Resp: 18 18    Intake/Output Summary (Last 24 hours) at 02/23/15 1203 Last data filed at 02/23/15 1158  Gross per 24 hour  Intake      0 ml  Output   1175 ml  Net  -1175 ml   Filed Weights   02/18/15 1301 02/19/15 0500 02/20/15 0500  Weight: 78.019 kg (172 lb) 76.114 kg (167 lb 12.8 oz) 79.153 kg (174 lb 8 oz)    Exam:   General:  Alert, NAD, speech clear  Cardiovascular: irregularly irregular  Respiratory: Few bibasilar rales-but mostly clear, no conversational dyspnea  Abdomen: soft, nontender, non distended  Musculoskeletal: trace BLE edema  Data Reviewed: Basic Metabolic Panel:  Recent Labs Lab 02/17/15 1553 02/18/15 1058 02/19/15 0640 02/21/15 0218  NA 136 138 137 134*  K 4.2 3.9 3.7 3.7  CL 108 109 104 100*  CO2 19* 19* 21* 21*  GLUCOSE 111* 138* 103* 98  BUN 42* 34* 29* 26*  CREATININE 1.98* 1.99* 2.03* 2.01*  CALCIUM 8.7* 8.0* 8.5* 8.3*  MG  --   --   --  1.6*   Liver Function Tests:  Recent Labs Lab 02/17/15 1553  AST 43*  ALT 38  ALKPHOS 216*  BILITOT 1.0  PROT 7.3  ALBUMIN 3.4*   No results for input(s): LIPASE, AMYLASE in the last 168 hours. No results for input(s): AMMONIA in the last 168 hours. CBC:  Recent Labs Lab 02/17/15 1553 02/18/15 1058 02/21/15 0218  WBC 8.1 6.6 8.9  NEUTROABS 5.1  --   --   HGB 10.3* 9.3* 10.1*  HCT 31.6* 28.4* 30.1*  MCV 101.9* 101.4* 100.7*  PLT 193 165 180   Cardiac Enzymes: No results for input(s): CKTOTAL, CKMB, CKMBINDEX, TROPONINI in the last 168 hours. BNP (last 3 results)  Recent Labs  02/17/15 1553  BNP 820.8*    ProBNP (last 3 results) No results for input(s): PROBNP in the last 8760  hours.  CBG:  Recent Labs Lab 02/17/15 1543 02/18/15 1137  GLUCAP 92 127*    Recent Results (from the past 240 hour(s))  Blood Culture (routine x 2)     Status: None   Collection Time: 02/17/15  4:00 PM  Result Value Ref Range Status   Specimen Description BLOOD RIGHT FOREARM  Final   Special Requests BOTTLES DRAWN AEROBIC AND ANAEROBIC 5ML  Final   Culture   Final    NO GROWTH 5 DAYS Performed at [facility]    Report Status 02/22/2015 FINAL  Final  Urine culture     Status: None   Collection Time: 02/17/15  4:18 PM  Result Value Ref Range Status   Specimen Description URINE, CLEAN CATCH  Final   Special Requests NONE  Final   Culture   Final    MULTIPLE SPECIES PRESENT, SUGGEST RECOLLECTION Performed at [facility]    Report Status 02/18/2015 FINAL  Final  C difficile quick scan w PCR reflex     Status: Abnormal   Collection Time: 02/19/15  5:07 AM  Result Value Ref Range Status   C Diff antigen POSITIVE (A) NEGATIVE  Final   C Diff toxin NEGATIVE NEGATIVE Final   C Diff interpretation   Final    C. difficile present, but toxin not detected. This indicates colonization. In most cases, this does not require treatment. If patient has signs and symptoms consistent with colitis, consider treatment. Requires ENTERIC precautions.  MRSA PCR Screening     Status: None   Collection Time: 02/19/15  2:00 PM  Result Value Ref Range Status   MRSA by PCR NEGATIVE NEGATIVE Final    Comment:        The GeneXpert MRSA Assay (FDA approved for NASAL specimens only), is one component of a comprehensive MRSA colonization surveillance program. It is not intended to diagnose MRSA infection nor to guide or monitor treatment for MRSA infections.   Culture, sputum-assessment     Status: None   Collection Time: 02/19/15  2:21 PM  Result Value Ref Range Status   Specimen Description SPUTUM  Final   Special Requests NONE  Final   Sputum evaluation   Final     MICROSCOPIC FINDINGS SUGGEST THAT THIS SPECIMEN IS NOT REPRESENTATIVE OF LOWER RESPIRATORY SECRETIONS. PLEASE RECOLLECT. RESULT CALLED TO, READ BACK BY AND VERIFIED WITH: H HUNTER 02/19/15 @ 43 M VESTAL    Report Status 02/19/2015 FINAL  Final     Studies: No results found.  Scheduled Meds: . aspirin EC  81 mg Oral Daily  . doxazosin  8 mg Oral QHS  . doxycycline  100 mg Oral Q12H  . ezetimibe  10 mg Oral q morning - 10a  . finasteride  5 mg Oral Daily  . [START ON 02/24/2015] furosemide  40 mg Oral BID  . levothyroxine  100 mcg Oral QAC breakfast  . loperamide  2 mg Oral TID  . metoprolol succinate  50 mg Oral Daily  . phenazopyridine  100 mg Oral BID WC  . rivaroxaban  15 mg Oral Q supper  . saccharomyces boulardii  250 mg Oral q morning - 10a  . sertraline  50 mg Oral Daily   Continuous Infusions:   Principal Problem:   CAP (community acquired pneumonia) Active Problems:   Dyslipidemia   Essential hypertension   Merkel cell carcinoma (HCC)   Chronic atrial fibrillation (HCC)   BPH (benign prostatic hyperplasia)   Hypothyroidism   UTI (lower urinary tract infection)   Acute on chronic combined systolic and diastolic congestive heart failure (HCC)   Slurred speech   Dizziness   TIA (transient ischemic attack)     LOS: 6 days    Signature  SINGH,PRASHANT K M.D on 02/23/2015 at 12:03 PM  Between 7am to 7pm - Pager - [phone], After 7pm go to [email or web address] - password [facility]  [facility]  - Office  [phone]

## 2015-02-24 ENCOUNTER — Inpatient Hospital Stay (HOSPITAL_COMMUNITY): Payer: Medicare Other

## 2015-02-24 LAB — CBC
HEMATOCRIT: 32.2 % — AB (ref 39.0–52.0)
Hemoglobin: 10.6 g/dL — ABNORMAL LOW (ref 13.0–17.0)
MCH: 32.4 pg (ref 26.0–34.0)
MCHC: 32.9 g/dL (ref 30.0–36.0)
MCV: 98.5 fL (ref 78.0–100.0)
PLATELETS: 238 10*3/uL (ref 150–400)
RBC: 3.27 MIL/uL — ABNORMAL LOW (ref 4.22–5.81)
RDW: 15.8 % — AB (ref 11.5–15.5)
WBC: 6.4 10*3/uL (ref 4.0–10.5)

## 2015-02-24 LAB — URINALYSIS, ROUTINE W REFLEX MICROSCOPIC
BILIRUBIN URINE: NEGATIVE
GLUCOSE, UA: NEGATIVE mg/dL
KETONES UR: NEGATIVE mg/dL
NITRITE: POSITIVE — AB
PH: 5.5 (ref 5.0–8.0)
Protein, ur: 100 mg/dL — AB
SPECIFIC GRAVITY, URINE: 1.012 (ref 1.005–1.030)

## 2015-02-24 LAB — URINE MICROSCOPIC-ADD ON

## 2015-02-24 LAB — MAGNESIUM: Magnesium: 1.7 mg/dL (ref 1.7–2.4)

## 2015-02-24 LAB — TSH: TSH: 8.237 u[IU]/mL — AB (ref 0.350–4.500)

## 2015-02-24 MED ORDER — SODIUM CHLORIDE 0.9 % IV SOLN
INTRAVENOUS | Status: DC
  Administered 2015-02-24: 11:00:00 via INTRAVENOUS

## 2015-02-24 MED ORDER — DEXTROSE 5 % IV SOLN
1.0000 g | INTRAVENOUS | Status: DC
  Administered 2015-02-24 – 2015-02-25 (×2): 1 g via INTRAVENOUS
  Filled 2015-02-24 (×3): qty 10

## 2015-02-24 MED ORDER — DOXAZOSIN MESYLATE 8 MG PO TABS
4.0000 mg | ORAL_TABLET | Freq: Every day | ORAL | Status: DC
  Administered 2015-02-24: 4 mg via ORAL
  Filled 2015-02-24: qty 1

## 2015-02-24 MED ORDER — SODIUM CHLORIDE 0.9 % IV SOLN
INTRAVENOUS | Status: DC
  Administered 2015-02-24: 12:00:00 via INTRAVENOUS

## 2015-02-24 MED ORDER — MIDODRINE HCL 5 MG PO TABS
5.0000 mg | ORAL_TABLET | Freq: Three times a day (TID) | ORAL | Status: DC
  Administered 2015-02-24 – 2015-02-25 (×4): 5 mg via ORAL
  Filled 2015-02-24 (×4): qty 1

## 2015-02-24 MED ORDER — SODIUM CHLORIDE 0.9 % IV BOLUS (SEPSIS)
500.0000 mL | Freq: Once | INTRAVENOUS | Status: AC
  Administered 2015-02-24: 500 mL via INTRAVENOUS

## 2015-02-24 NOTE — Clinical Social Work Note (Signed)
CSW was informed that patient and his family would like him to go to [facility] instead of [facility] due to [facility] not having a private room available.  CSW to continue to follow patient's progress.  Jones Broom. [place], MSW, [place] 02/24/2015 3:51 PM

## 2015-02-24 NOTE — Progress Notes (Signed)
PT Cancellation Note  Patient Details Name: Harold Jennings MRN: [id number] DOB: [birth year]   Cancelled Treatment:    Reason Eval/Treat Not Completed: Medical issues which prohibited therapy   Spoke with Deedee, RN and she reported she got patient up this morning and he passed out with orthostasis. Reports he is febrile. Will defer PT today.   Tamyka Bezio 02/24/2015, 10:31 AM  Pager [phone]

## 2015-02-24 NOTE — Care Management Note (Signed)
Case Management Note  Patient Details  Name: Harold Jennings MRN: [id number] Date of Birth: Mar 15, 1932  Subjective/Objective:                    Action/Plan: Plan is for patient to discharge to SNF rehab when medically stable. CM continuing to follow for any further discharge needs.   Expected Discharge Date:   (unknown)               Expected Discharge Plan:     In-House Referral:     Discharge planning Services     Post Acute Care Choice:    Choice offered to:     DME Arranged:    DME Agency:     HH Arranged:    HH Agency:     Status of Service:  In process, will continue to follow  Medicare Important Message Given:    Date Medicare IM Given:    Medicare IM give by:    Date Additional Medicare IM Given:    Additional Medicare Important Message give by:     If discussed at [place] of Stay Meetings, dates discussed:    Additional Comments:  Pollie Friar, RN 02/24/2015, 3:41 PM

## 2015-02-24 NOTE — Progress Notes (Signed)
Attempted to put pt in bad, he stood with 2 help of RN and CNA, and became diaphoretic, pallor and became unresponsive. Was gently layed on the ground due to being dead weight, and within 2 minutes he was responsive again. Dr called. Bolus given and TED hose applied. New IV was started due to other IV being clotted.

## 2015-02-24 NOTE — Progress Notes (Signed)
Occupational Therapy Treatment Patient Details Name: Harold Jennings MRN: [id number] DOB: [birth year] Today's Date: 02/24/2015    History of present illness Harold Jennings is a 82 y.o. male with Past medical history of essential hypertension, chronic A. fib on chronic anticoagulation, coronary disease, sleep apnea on BiPAP daily at bedtime, chronic combined CHF.  Admitted with symptoms of slurred speech, hypotension and vertigo.  Imaging wtih old lacunar infarcts bilat Basal ganglia and R cerebellum, but no acute infarcts.  Found to have CAP/   OT comments  Pts progress toward OT goals limited by low BP and O2 with orthostatic episode this AM resulting in passing out per RN. MD requesting attempt at getting pt OOB to chair if tolerating. Pt currently min assist for bed mobility; requiring assist for trunk support and maintaining balance in sitting. Min assist +2 for stand pivot transfer from EOB to chair with RN assisting. Max assist at this time for donning socks; pt unable to maintain sitting balance without UE support. Pt on 4 L O2 throughout session with drops in SpO2 to 81-83; pt asymptomatic throughout functional transfer. Will continue to follow acutely.    Follow Up Recommendations  SNF    Equipment Recommendations  None recommended by OT    Recommendations for Other Services      Precautions / Restrictions Precautions Precautions: Fall Precaution Comments: watch O2 sats and BP Restrictions Weight Bearing Restrictions: No       Mobility Bed Mobility Overal bed mobility: Needs Assistance Bed Mobility: Supine to Sit     Supine to sit: Min assist     General bed mobility comments: Min assist for trunk and maintaining balance once in sitting. VCs throughout for sequencing.  Transfers Overall transfer level: Needs assistance Equipment used: 2 person hand held assist Transfers: Sit to/from [facility] Sit to Stand: Min assist;+2 physical assistance;+2  safety/equipment Stand pivot transfers: Min assist;+2 physical assistance;+2 safety/equipment       General transfer comment: Min assist for balance and safety throughout transfer. Sit to stand from EOB x 1, stand pivot from EOB to chair.    Balance Overall balance assessment: Needs assistance Sitting-balance support: Feet supported;Bilateral upper extremity supported Sitting balance-[name] Scale: Poor   Postural control: Right lateral lean Standing balance support: Bilateral upper extremity supported Standing balance-[name] Scale: Poor Standing balance comment: 2 person hand held support for balance in standing.                   ADL Overall ADL's : Needs assistance/impaired                     Lower Body Dressing: Maximal assistance Lower Body Dressing Details (indicate cue type and reason): Pt unable to don socks sitting EOB; required max assist. Pt with difficutly maintaining balance in sitting; consistent VCs for sitting up straight, R lateral lean noted.             Functional mobility during ADLs: Minimal assistance;+2 for physical assistance;+2 for safety/equipment (stand pivot ) General ADL Comments: Pt with low BP and O2. Episode of passing out this morning with orthostasis per RN. RN present to assist with transfer. Pt remained on 4 L O2 throughtout session. Pt reports no dizziness with supine to sit and sit to stand.      Vision                     Perception     Praxis  Cognition   Behavior During Therapy: WFL for tasks assessed/performed Overall Cognitive Status: Within Functional Limits for tasks assessed                       Extremity/Trunk Assessment               Exercises     Shoulder Instructions       General Comments      Pertinent Vitals/ Pain       Pain Assessment: No/denies pain  Home Living                                          Prior Functioning/Environment               Frequency Min 2X/week     Progress Toward Goals  OT Goals(current goals can now be found in the care plan section)  Progress towards OT goals: Not progressing toward goals - comment (limited by orthostatic episode this AM)  Acute Rehab OT Goals Patient Stated Goal: get stronger OT Goal Formulation: With patient Time For Goal Achievement: 03/05/15 Potential to Achieve Goals: Good  Plan Discharge plan remains appropriate    Co-evaluation                 End of Session Equipment Utilized During Treatment: Oxygen   Activity Tolerance Other (comment) (Limited by low BP and O2)   Patient Left in chair;with call bell/phone within reach;with chair alarm set;with nursing/sitter in room   Nurse Communication Mobility status (RN present for transfer)        Time: [id number] OT Time Calculation (min): 24 min  Charges: OT General Charges $OT Visit: 1 Procedure OT Treatments $Self Care/Home Management : 8-22 mins $Therapeutic Activity: 8-22 mins  Binnie Kand M.S., OTR/L Pager: [phone]  02/24/2015, 11:43 AM

## 2015-02-24 NOTE — Progress Notes (Addendum)
[facility] PROGRESS NOTE  Harold Jennings [id number] DOB: 04/19/1932 DOA: 02/17/2015 PCP: London Pepper, MD  Brief Narrative: 82 year old male who presented with dizziness and slurred speech, he was found to be hypotension at his PCPs office and was sent to the ED. MRI showed nothing acute but CXR showed patchy infiltrate concerning for PNA and CHF. He is on Azithromycin + Rocephin to cover PNA and UTI. Had -1.7L balance after Lasix. He seems to be improving from a respiratory standpoint, has no focal deficits and no hypotension.  Subjective:  Feels improved but still with exertional dyspnea. Multiple loose BMs yesterday and today, he notes this is chronic and occurs and typically improves with Imodium. Complaining of suprapubic fullness and pain.  Assessment/Plan:  CAP (community acquired pneumonia): CXR shows multifocal patchy infiltrates. Lactic acid negative, no leukocytosis. Wrist antibiotics on 02/24/2015 for this problem. He reports some exertional dyspnea but clinically improved. Oxygen as needed. Influenza PCR negative. Urine Legionella and streptococcal antigen negative. Blood cultures negative   Acute on chronic systolic CHF: Diuresed adequately now signs of dehydration and severe orthostatic hypotension, hold diuretics.  TIA vs Mild Encephalopathy: Suspect metabolic encephalopathy and hypotension. MRI negative for acute infarct, multiple old infarcts noted. LDL 56, a1c 4.5. Carotid Doppler <40% stenosis. Echo nonacute. No further hypotension. Speech clear, no dizziness. PT recommending HHPT versus SNF.  Recurrent UTI with urinary retention: has had multiple infections in the past year, most recent urine culture (03/2014) with Klebsiella. Developed urinary retention and signs of UTI again on 02/23/2015, had to replace Foley and resume ceftriaxone on 02/24/2015. On it or cultures.  Diarrhea, chronic: He is currently having diarrhea, C. difficile Toxin negative, add Imodium at a  higher dose.  Chronic A. Fib [place] vasc 2 score of >3: Continue Xarelto. Rate controlled with Toprol, if blood pressures remain soft with high heart rate will add digoxin with caution. Seen by cardiology.  Chronic kidney disease Stage 3-4: Cr bumped up to 2.5 in 06/2014, and has remained elevated since. Baseline creatinine 1.5. Clinically dehydrated will give gentle bolus and monitor. Lasix dose reduced  Hx of CAD: stable-no chest pain, continue Metoprolol, ASA, LDL 56 no need for statin. 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 Stable for outpatient monitoring and follow up. No evidence of active bleeding.  Hypothyroidism:  TSH mildly high, increase Synthroid.  BPH: Stable-continue Flomax/Cardura. Follow  OSA: non compliant to CPAP  Urinary retention due to BPH. Required straight cath 3, [address] Foley, continue BPH medications and follow with Dr. Dorina Hoyer his primary urologist outpatient.  Orthostatic hypotension with syncopal episodes upon standing up. Baseline blood pressure also soft, hold diuretics, gently hydrate and monitor. It stockings applied. Added midodrine as well. Check baseline cortisol and TSH.    Code Status: FULL at present, pt wants to speak with is wife and clarify Family Communication: None at bedside  Disposition Plan: SNF vs HHPT in am DVT PPX: on Xarelto   Consultants:  Neurology  Procedures:  Echo  Left ventricle: The cavity size was mildly dilated. Wallthickness was increased in a pattern of mild LVH. Systolicfunction was mildly to moderately reduced. The estimated ejection fraction was in the range of 40% to 45%. Moderate hypokinesis of the basal-midinferior myocardium. - Aortic valve: There was trivial regurgitation. Valve area (VTI): 2.1 cm^2. Valve area (Vmax): 2.31 cm^2. Valve area (Vmean): 1.83 cm^2. - Left atrium: The atrium was severely dilated. - Right ventricle: The cavity  size was  mildly dilated. - Right atrium: The atrium was moderately dilated. - Pulmonary arteries: Systolic pressure was mildly to moderately increased. PA peak pressure: 39 mm Hg (S).  Signature  Thurnell Lose M.D on 02/24/2015 at 11:28 AM  Between 7am to 7pm - Pager - [phone], After 7pm go to [email or web address] - password [facility]  [facility]  - Office  [phone]      Objective: Filed Vitals:   02/24/15 0249 02/24/15 0445  BP: 91/55 108/63  Pulse: 97 93  Temp: 99.7 F (37.6 C) 99 F (37.2 C)  Resp: 18 18    Intake/Output Summary (Last 24 hours) at 02/24/15 1128 Last data filed at 02/24/15 0527  Gross per 24 hour  Intake    120 ml  Output   1780 ml  Net  -1660 ml   Filed Weights   02/18/15 1301 02/19/15 0500 02/20/15 0500  Weight: 78.019 kg (172 lb) 76.114 kg (167 lb 12.8 oz) 79.153 kg (174 lb 8 oz)    Exam:   General:  Alert, NAD, speech clear  Cardiovascular: irregularly irregular  Respiratory: Few bibasilar rales-but mostly clear, no conversational dyspnea  Abdomen: soft, nontender, non distended  Musculoskeletal: trace BLE edema  Data Reviewed: Basic Metabolic Panel:  Recent Labs Lab 02/17/15 1553 02/18/15 1058 02/19/15 0640 02/21/15 0218 02/24/15 0820  NA 136 138 137 134*  --   K 4.2 3.9 3.7 3.7  --   CL 108 109 104 100*  --   CO2 19* 19* 21* 21*  --   GLUCOSE 111* 138* 103* 98  --   BUN 42* 34* 29* 26*  --   CREATININE 1.98* 1.99* 2.03* 2.01*  --   CALCIUM 8.7* 8.0* 8.5* 8.3*  --   MG  --   --   --  1.6* 1.7   Liver Function Tests:  Recent Labs Lab 02/17/15 1553  AST 43*  ALT 38  ALKPHOS 216*  BILITOT 1.0  PROT 7.3  ALBUMIN 3.4*   No results for input(s): LIPASE, AMYLASE in the last 168 hours. No results for input(s): AMMONIA in the last 168 hours. CBC:  Recent Labs Lab 02/17/15 1553 02/18/15 1058 02/21/15 0218 02/24/15 0820  WBC 8.1 6.6 8.9 6.4  NEUTROABS 5.1  --   --   --   HGB 10.3* 9.3* 10.1* 10.6*  HCT  31.6* 28.4* 30.1* 32.2*  MCV 101.9* 101.4* 100.7* 98.5  PLT 193 165 180 238   Cardiac Enzymes: No results for input(s): CKTOTAL, CKMB, CKMBINDEX, TROPONINI in the last 168 hours. BNP (last 3 results)  Recent Labs  02/17/15 1553  BNP 820.8*    ProBNP (last 3 results) No results for input(s): PROBNP in the last 8760 hours.  CBG:  Recent Labs Lab 02/17/15 1543 02/18/15 1137  GLUCAP 92 127*    Recent Results (from the past 240 hour(s))  Blood Culture (routine x 2)     Status: None   Collection Time: 02/17/15  4:00 PM  Result Value Ref Range Status   Specimen Description BLOOD RIGHT FOREARM  Final   Special Requests BOTTLES DRAWN AEROBIC AND ANAEROBIC 5ML  Final   Culture   Final    NO GROWTH 5 DAYS Performed at [facility]    Report Status 02/22/2015 FINAL  Final  Urine culture     Status: None   Collection Time: 02/17/15  4:18 PM  Result Value Ref Range Status   Specimen Description URINE, CLEAN CATCH  Final  Special Requests NONE  Final   Culture   Final    MULTIPLE SPECIES PRESENT, SUGGEST RECOLLECTION Performed at [facility]    Report Status 02/18/2015 FINAL  Final  C difficile quick scan w PCR reflex     Status: Abnormal   Collection Time: 02/19/15  5:07 AM  Result Value Ref Range Status   C Diff antigen POSITIVE (A) NEGATIVE Final   C Diff toxin NEGATIVE NEGATIVE Final   C Diff interpretation   Final    C. difficile present, but toxin not detected. This indicates colonization. In most cases, this does not require treatment. If patient has signs and symptoms consistent with colitis, consider treatment. Requires ENTERIC precautions.  MRSA PCR Screening     Status: None   Collection Time: 02/19/15  2:00 PM  Result Value Ref Range Status   MRSA by PCR NEGATIVE NEGATIVE Final    Comment:        The GeneXpert MRSA Assay (FDA approved for NASAL specimens only), is one component of a comprehensive MRSA colonization surveillance program. It is  not intended to diagnose MRSA infection nor to guide or monitor treatment for MRSA infections.   Culture, sputum-assessment     Status: None   Collection Time: 02/19/15  2:21 PM  Result Value Ref Range Status   Specimen Description SPUTUM  Final   Special Requests NONE  Final   Sputum evaluation   Final    MICROSCOPIC FINDINGS SUGGEST THAT THIS SPECIMEN IS NOT REPRESENTATIVE OF LOWER RESPIRATORY SECRETIONS. PLEASE RECOLLECT. RESULT CALLED TO, READ BACK BY AND VERIFIED WITH: H HUNTER 02/19/15 @ 34 M VESTAL    Report Status 02/19/2015 FINAL  Final     Studies: Dg Chest Port 1 View  02/24/2015  CLINICAL DATA:  Shortness of breath and hypotension EXAM: PORTABLE CHEST 1 VIEW COMPARISON:  February 21, 2015 FINDINGS: There is patchy airspace opacity in each mid lung. There is generalized interstitial prominence, likely representing edema. There is cardiomegaly. The pulmonary vascularity is within normal limits. Bones are osteoporotic. No adenopathy evident. IMPRESSION: Suspect a degree of congestive heart failure. Patchy airspace opacity in each mid lung region may represent alveolar edema but also could represent superimposed pneumonia. No new opacity is appreciable compared to recent prior study. No change in cardiac silhouette. Electronically Signed   By: Lowella Grip III M.D.   On: 02/24/2015 08:06    Scheduled Meds: . aspirin EC  81 mg Oral Daily  . cefTRIAXone (ROCEPHIN)  IV  1 g Intravenous Q24H  . doxazosin  4 mg Oral QHS  . doxycycline  100 mg Oral Q12H  . ezetimibe  10 mg Oral q morning - 10a  . finasteride  5 mg Oral Daily  . levothyroxine  100 mcg Oral QAC breakfast  . loperamide  2 mg Oral TID  . metoprolol succinate  50 mg Oral Daily  . midodrine  5 mg Oral TID WC  . phenazopyridine  100 mg Oral BID WC  . rivaroxaban  15 mg Oral Q supper  . saccharomyces boulardii  250 mg Oral q morning - 10a  . sertraline  50 mg Oral Daily   Continuous Infusions: . sodium chloride       Principal Problem:   CAP (community acquired pneumonia) Active Problems:   Dyslipidemia   Essential hypertension   Merkel cell carcinoma (HCC)   Chronic atrial fibrillation (HCC)   BPH (benign prostatic hyperplasia)   Hypothyroidism   UTI (lower urinary tract  infection)   Acute on chronic combined systolic and diastolic congestive heart failure (HCC)   Slurred speech   Dizziness   TIA (transient ischemic attack)     LOS: 7 days    Signature  Lala Lund K M.D on 02/24/2015 at 11:28 AM  Between 7am to 7pm - Pager - [phone], After 7pm go to [email or web address] - password [facility]  [facility]  - Office  [phone]

## 2015-02-25 LAB — BASIC METABOLIC PANEL
Anion gap: 11 (ref 5–15)
BUN: 29 mg/dL — AB (ref 6–20)
CHLORIDE: 102 mmol/L (ref 101–111)
CO2: 21 mmol/L — AB (ref 22–32)
CREATININE: 2.02 mg/dL — AB (ref 0.61–1.24)
Calcium: 7.9 mg/dL — ABNORMAL LOW (ref 8.9–10.3)
GFR calc Af Amer: 34 mL/min — ABNORMAL LOW (ref >60)
GFR calc non Af Amer: 29 mL/min — ABNORMAL LOW (ref >60)
GLUCOSE: 86 mg/dL (ref 65–99)
POTASSIUM: 3.6 mmol/L (ref 3.5–5.1)
SODIUM: 134 mmol/L — AB (ref 135–145)

## 2015-02-25 LAB — URINE CULTURE: Culture: 30000

## 2015-02-25 LAB — CORTISOL: Cortisol, Plasma: 14.6 ug/dL

## 2015-02-25 MED ORDER — LEVOTHYROXINE SODIUM 125 MCG PO TABS: 125.0000 ug | ORAL_TABLET | Freq: Every day | ORAL | Status: AC

## 2015-02-25 MED ORDER — MIDODRINE HCL 5 MG PO TABS
10.0000 mg | ORAL_TABLET | Freq: Three times a day (TID) | ORAL | Status: DC
  Administered 2015-02-25: 10 mg via ORAL
  Filled 2015-02-25: qty 2

## 2015-02-25 MED ORDER — CEFPODOXIME PROXETIL 200 MG PO TABS: 200.0000 mg | ORAL_TABLET | Freq: Two times a day (BID) | ORAL | Status: DC

## 2015-02-25 MED ORDER — MIDODRINE HCL 10 MG PO TABS: 10.0000 mg | ORAL_TABLET | Freq: Three times a day (TID) | ORAL | Status: DC

## 2015-02-25 MED ORDER — LEVOTHYROXINE SODIUM 100 MCG PO TABS
125.0000 ug | ORAL_TABLET | Freq: Every day | ORAL | Status: DC
  Administered 2015-02-25: 125 ug via ORAL
  Filled 2015-02-25: qty 1

## 2015-02-25 MED ORDER — ASPIRIN 81 MG PO TBEC: 81.0000 mg | DELAYED_RELEASE_TABLET | Freq: Every day | ORAL | Status: AC

## 2015-02-25 MED ORDER — SODIUM CHLORIDE 0.9 % IV BOLUS (SEPSIS)
500.0000 mL | Freq: Once | INTRAVENOUS | Status: AC
  Administered 2015-02-25: 500 mL via INTRAVENOUS

## 2015-02-25 NOTE — Progress Notes (Signed)
Patient refuses CPAP at this time.  Will have RT called should he change his mind.

## 2015-02-25 NOTE — Clinical Social Work Placement (Signed)
   CLINICAL SOCIAL WORK PLACEMENT  NOTE  Date:  02/25/2015  Patient Details  Name: Harold Jennings MRN: [id number] Date of Birth: 1932-06-08  Clinical Social Work is seeking post-discharge placement for this patient at the [place] level of care (*CSW will initial, date and re-position this form in  chart as items are completed):  Yes   Patient/family provided with [place] Work Department's list of facilities offering this level of care within the geographic area requested by the patient (or if unable, by the patient's family).  Yes   Patient/family informed of their freedom to choose among providers that offer the needed level of care, that participate in Medicare, Medicaid or managed care program needed by the patient, have an available bed and are willing to accept the patient.  Yes   Patient/family informed of [place]'s ownership interest in [facility] and [facility], as well as of the fact that they are under no obligation to receive care at these facilities.  PASRR submitted to EDS on       PASRR number received on       Existing PASRR number confirmed on 02/22/15     FL2 transmitted to all facilities in geographic area requested by pt/family on 02/22/15     FL2 transmitted to all facilities within larger geographic area on       Patient informed that his/her managed care company has contracts with or will negotiate with certain facilities, including the following:        Yes   Patient/family informed of bed offers received.  Patient chooses bed at  ([place] )     Physician recommends and patient chooses bed at      Patient to be transferred to  ([place] ) on 02/25/15.  Patient to be transferred to facility by  Corey Harold )     Patient family notified on 02/25/15 of transfer.  Name of family member notified:   (Pt's wife, [name])     PHYSICIAN Please sign FL2, Please prepare  priority discharge summary, including medications     Additional Comment:    _______________________________________________ Rozell Searing, LCSW 02/25/2015, 10:25 AM

## 2015-02-25 NOTE — Progress Notes (Signed)
Pt sitting up in chair at this time. He denies pain or discomfort. No noted distress. Safety measures in place. Call bell within reach. Will continue to monitor.

## 2015-02-25 NOTE — Discharge Instructions (Signed)
Follow with Primary MD London Pepper, MD in 7 days   Get CBC, CMP, 2 view Chest X ray checked  by Primary MD next visit.    Activity: As tolerated with Full fall precautions use walker/cane & assistance as needed   Disposition SNF   Diet:   Heart Healthy  with feeding assistance and aspiration precautions.  For Heart failure patients - Check your Weight same time everyday, if you gain over 2 pounds, or you develop in leg swelling, experience more shortness of breath or chest pain, call your Primary MD immediately. Follow Cardiac Low Salt Diet and 1.5 lit/day fluid restriction.   On your next visit with your primary care physician please Get Medicines reviewed and adjusted.   Please request your Prim.MD to go over all Hospital Tests and Procedure/Radiological results at the follow up, please get all Hospital records sent to your Prim MD by signing hospital release before you go home.   If you experience worsening of your admission symptoms, develop shortness of breath, life threatening emergency, suicidal or homicidal thoughts you must seek medical attention immediately by calling 911 or calling your MD immediately  if symptoms less severe.  You Must read complete instructions/literature along with all the possible adverse reactions/side effects for all the Medicines you take and that have been prescribed to you. Take any new Medicines after you have completely understood and accpet all the possible adverse reactions/side effects.   Do not drive, operating heavy machinery, perform activities at heights, swimming or participation in water activities or provide baby sitting services if your were admitted for syncope or siezures until you have seen by Primary MD or a Neurologist and advised to do so again.  Do not drive when taking Pain medications.    Do not take more than prescribed Pain, Sleep and Anxiety Medications  Special Instructions: If you have smoked or chewed Tobacco  in the  last 2 yrs please stop smoking, stop any regular Alcohol  and or any Recreational drug use.  Wear Seat belts while driving.   Please note  You were cared for by a hospitalist during your hospital stay. If you have any questions about your discharge medications or the care you received while you were in the hospital after you are discharged, you can call the unit and asked to speak with the hospitalist on call if the hospitalist that took care of you is not available. Once you are discharged, your primary care physician will handle any further medical issues. Please note that NO REFILLS for any discharge medications will be authorized once you are discharged, as it is imperative that you return to your primary care physician (or establish a relationship with a primary care physician if you do not have one) for your aftercare needs so that they can reassess your need for medications and monitor your lab values.

## 2015-02-25 NOTE — Progress Notes (Signed)
Physical Therapy Treatment Patient Details Name: Harold Jennings MRN: [id number] DOB: 1932/12/25 Today's Date: 02/25/2015    History of Present Illness Harold Jennings is a 82 y.o. male with Past medical history of essential hypertension, chronic A. fib on chronic anticoagulation, coronary disease, sleep apnea on BiPAP daily at bedtime, chronic combined CHF.  Admitted with symptoms of slurred speech, hypotension and vertigo.  Imaging wtih old lacunar infarcts bilat Basal ganglia and R cerebellum, but no acute infarcts.  Found to have CAP/    PT Comments    Patient seen for PT session which focused on overall mobility. Transitions in position monitored and time taken for patient to accommodate to positional change. Equipment malfunctioning limited ability to take BP in sitting and standing. Continue to recommend SNF when medically stable. PT to continue to follow.   Follow Up Recommendations  SNF;Supervision/Assistance - 24 hour     Equipment Recommendations  Other (comment) (to be addressed at next venue)    Recommendations for Other Services       Precautions / Restrictions Precautions Precautions: Fall Precaution Comments: watch O2 sats and BP Restrictions Weight Bearing Restrictions: No    Mobility  Bed Mobility Overal bed mobility: Needs Assistance      Supine to sit: Min guard     General bed mobility comments: assist at trunk for sitting EOB, patient using rail and HOB elevated.   Transfers Overall transfer level: Needs assistance Equipment used: Rolling walker (2 wheeled) Transfers: Sit to/from Stand Sit to Stand: Mod assist Stand pivot transfers: Min assist       General transfer comment: min assist for stability during transfer.   Ambulation/Gait                 Stairs            Wheelchair Mobility    Modified Rankin (Stroke Patients Only)       Balance Overall balance assessment: Needs assistance Sitting-balance support: Feet  supported;Bilateral upper extremity supported Sitting balance-[name] Scale: Fair     Standing balance support: Bilateral upper extremity supported Standing balance-[name] Scale: Poor Standing balance comment: using rw and physical assist                    Cognition Arousal/Alertness: Awake/alert Behavior During Therapy: WFL for tasks assessed/performed Overall Cognitive Status: Within Functional Limits for tasks assessed                      Exercises      General Comments General comments (skin integrity, edema, etc.): BP supine 112/67, 100 BPM, SpO2 100%. Once sitting equipment not functioning, unable to take BP. Once sitting patient denies feeling dizzy or unsteady. Left in the care of nursing.      Pertinent Vitals/Pain Pain Assessment: No/denies pain    Home Living                      Prior Function            PT Goals (current goals can now be found in the care plan section) Acute Rehab PT Goals Patient Stated Goal: get stronger PT Goal Formulation: With patient/family Time For Goal Achievement: 02/25/15 Potential to Achieve Goals: Good Progress towards PT goals: Progressing toward goals    Frequency  Min 3X/week    PT Plan Current plan remains appropriate    Co-evaluation  End of Session Equipment Utilized During Treatment: Gait belt;Oxygen Activity Tolerance: Other (comment) (dizzy with change in position but resolving with time. ) Patient left: in chair;with call bell/phone within reach;with chair alarm set;with nursing/sitter in room;Other (comment) (pt refused legs being elevated. )     Time: [id number] PT Time Calculation (min) (ACUTE ONLY): 30 min  Charges:  $Therapeutic Activity: 23-37 mins                    G Codes:      Cassell Clement, PT, CSCS Pager [phone] Office [phone]  02/25/2015, 11:31 AM

## 2015-02-25 NOTE — Care Management Note (Signed)
Case Management Note  Patient Details  Name: AHMANI ALVARADO MRN: [id number] Date of Birth: 29-Jul-1932  Subjective/Objective:                    Action/Plan: Patient being discharged to [facility] place today. No further needs per CM.  Expected Discharge Date:   (unknown)               Expected Discharge Plan:     In-House Referral:     Discharge planning Services     Post Acute Care Choice:    Choice offered to:     DME Arranged:    DME Agency:     HH Arranged:    HH Agency:     Status of Service:  In process, will continue to follow  Medicare Important Message Given:    Date Medicare IM Given:    Medicare IM give by:    Date Additional Medicare IM Given:    Additional Medicare Important Message give by:     If discussed at [place] of Stay Meetings, dates discussed:    Additional Comments:  Pollie Friar, RN 02/25/2015, 2:02 PM

## 2015-02-25 NOTE — Clinical Social Work Placement (Signed)
   CLINICAL SOCIAL WORK PLACEMENT  NOTE  Date:  02/25/2015  Patient Details  Name: Harold Jennings MRN: [id number] Date of Birth: 11-24-1932  Clinical Social Work is seeking post-discharge placement for this patient at the [place] level of care (*CSW will initial, date and re-position this form in  chart as items are completed):  Yes   Patient/family provided with [place] Work Department's list of facilities offering this level of care within the geographic area requested by the patient (or if unable, by the patient's family).  Yes   Patient/family informed of their freedom to choose among providers that offer the needed level of care, that participate in Medicare, Medicaid or managed care program needed by the patient, have an available bed and are willing to accept the patient.  Yes   Patient/family informed of [place]'s ownership interest in [facility] and [facility], as well as of the fact that they are under no obligation to receive care at these facilities.  PASRR submitted to EDS on       PASRR number received on       Existing PASRR number confirmed on 02/22/15     FL2 transmitted to all facilities in geographic area requested by pt/family on 02/22/15     FL2 transmitted to all facilities within larger geographic area on       Patient informed that his/her managed care company has contracts with or will negotiate with certain facilities, including the following:        Yes   Patient/family informed of bed offers received.  Patient chooses bed at  ([place] )     Physician recommends and patient chooses bed at      Patient to be transferred to  ([place] ) on 02/25/15.  Patient to be transferred to facility by  Corey Harold )     Patient family notified on 02/25/15 of transfer.  Name of family member notified:   (Pt's wife, Diene)     PHYSICIAN Please sign FL2, Please prepare  priority discharge summary, including medications     Additional Comment:    _______________________________________________ Rozell Searing, LCSW 02/25/2015, 1:55 PM

## 2015-02-25 NOTE — Clinical Social Work Note (Signed)
Clinical Social Worker facilitated patient discharge including contacting patient family and facility to confirm patient discharge plans.  Clinical information faxed to facility and family agreeable with plan.  CSW arranged ambulance transport via [facility] to [facility].  RN to call report prior to discharge.  Clinical Social Worker will sign off for now as social work intervention is no longer needed. Please consult [place] again if new need arises.  Glendon Axe, MSW, LCSWA [phone] 02/25/2015 1:52 PM

## 2015-02-25 NOTE — Progress Notes (Signed)
Pt discharging at this time transported to [facility] via [employer] wife at bedside taking all personal belongings. No noted distress on 2L of  02 via n/c. Pt denies pain or discomfort. IV discontinued, dry dressing applied. Foley in place per Md order no to remove due to obstruction emptied. Report called in to nurse, [place].

## 2015-02-25 NOTE — Discharge Summary (Signed)
Harold Jennings, is a 82 y.o. male  DOB 1932/05/07  MRN [id number].  Admission date:  02/17/2015  Admitting Physician  Lavina Hamman, MD  Discharge Date:  02/25/2015   Primary MD  London Pepper, MD  Recommendations for primary care physician for things to follow:   CBC, BMP in 5 days, TSH in 4 weeks.  Monitor second risk factors for stroke closely.  Outpatient follow-up with neurology and urology.   Admission Diagnosis  Slurred speech [R47.81] UTI (lower urinary tract infection) [N39.0] CAP (community acquired pneumonia) [J18.9]   Discharge Diagnosis  Slurred speech [R47.81] UTI (lower urinary tract infection) [N39.0] CAP (community acquired pneumonia) [J18.9]     Principal Problem:   CAP (community acquired pneumonia) Active Problems:   Dyslipidemia   Essential hypertension   Merkel cell carcinoma (HCC)   Chronic atrial fibrillation (HCC)   BPH (benign prostatic hyperplasia)   Hypothyroidism   UTI (lower urinary tract infection)   Acute on chronic combined systolic and diastolic congestive heart failure (HCC)   Slurred speech   Dizziness   TIA (transient ischemic attack)      Past Medical History  Diagnosis Date  . Dyslipidemia   . HTN (hypertension)   . Obesity   . Increased liver enzymes     felt to be a fatty liver per ultrasound in 2011  . Dyspnea   . Ischemic cardiomyopathy   . Spinal stenosis   . Sleep apnea     bipap   . CAD (coronary artery disease)     Silent MI sometime prior to 2000. S/P cath in 2000 showing a total mid RCA with collaterals from the LCX. EF is 40 to50%; Last Myoview in 2010 showing inferior scar without ischemia. EF was 52%.  Marland Kitchen Dysrhythmia     atrial fib   . Edema     lower extremity   . Cancer ([place])     skin cancer   . Anemia   . Diverticular disease dec  2015  . Umbilical hernia   . Myocardial infarction ([place])   . CHF (congestive heart failure) [facility])     Past Surgical History  Procedure Laterality Date  . Total hip arthroplasty    . Eye surgery    . Lower back surgery      spinal stenosis  . Shoulder surgery    . Cardioversion N/A 06/06/2012    Procedure: CARDIOVERSION;  Surgeon: Thayer Headings, MD;  Location: [place];  Service: Cardiovascular;  Laterality: N/A;  . Cardiac catheterization    . Partial colectomy N/A 06/12/2014    Procedure: SIGMOID COLECTOMY AND TAKEDOWN OF COLOVESICAL FISTULA ;  Surgeon: Armandina Gemma, MD;  Location: [facility];  Service: General;  Laterality: N/A;  . Umbilical hernia repair N/A 06/12/2014    Procedure: INCARCERATED UMBILICAL HERNIA REPAIR  ;  Surgeon: Armandina Gemma, MD;  Location: [facility];  Service: General;  Laterality: N/A;  . Cystostomy N/A 06/12/2014    Procedure: Jasmine December;  Surgeon: Franchot Gallo, MD;  Location:  [facility];  Service: Urology;  Laterality: N/A;       HPI  from the history and physical done on the day of admission:    82 year old male who presented with dizziness and slurred speech, he was found to be hypotension at his PCPs office and was sent to the ED. MRI showed nothing acute but CXR showed patchy infiltrate concerning for PNA and CHF. He is on Azithromycin + Rocephin to cover PNA and UTI. Had -1.7L balance after Lasix. He seems to be improving from a respiratory standpoint, has no focal deficits, developed UTI again during his hospital stay along with urinary retention, also had severe orthostatic hypotension throughout his hospital stay.     Hospital Course:      CAP (community acquired pneumonia): Not cultures negative, strep pneumonia and legionella antigen negative, treated with antibiotics has completed his course for pneumonia per se. Clinically much better. Oxygen as needed 1-2 L nasal cannula, currently not short of breath, repeat CBC, BMP and a 2 view chest x-ray in  7-10 days.   Acute on chronic systolic CHF: Diuresed adequately now signs of dehydration and severe orthostatic hypotension, hold diuretics.  TIA vs Mild Encephalopathy: Suspect metabolic encephalopathy and hypotension. MRI negative for acute infarct, multiple old infarcts noted. LDL 56, a1c 4.5. Carotid Doppler <40% stenosis. Echo nonacute. No further hypotension. Speech clear, no dizziness. Seen by neurology they recommended addition of aspirin to his xaralto. No focal deficits at this time.  Recurrent UTI with urinary retention: has had multiple infections in the past year, most recent urine culture (03/2014) with Klebsiella. Developed urinary retention and signs of UTI again on 02/23/2015, had to replace Foley and resume ceftriaxone on 02/24/2015. We'll switch to [facility] for 5 more days. Needs outpatient follow-up with urology within a week. Will be discharged with Foley catheter.  Diarrhea, chronic: He is currently having diarrhea, C. difficile Toxin negative, add Imodium at a higher dose.  Chronic A. Fib [place] vasc 2 score of >3: Continue Xarelto. Rate controlled with Toprol, Seen by cardiology.  Chronic kidney disease Stage 3-4: Cr bumped up to 2.5 in 06/2014, and has remained elevated since. Baseline creatinine 1.5. Clinically dehydrated will give gentle bolus and monitor. Lasix dose reduced  Hx of CAD: stable-no chest pain, continue Metoprolol, ASA, LDL 56 no need for statin. Echo showed new regional wall motion abnormality-cardiology consulted, no further workup or recommendation.  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 Stable for outpatient monitoring and follow up. No evidence of active bleeding.  Hypothyroidism: TSH mildly high, increased Synthroid. Repeat TSH in 4 weeks.  BPH: Have continued Flomax due to severe orthostatic hypotension, continue other medications and follow with urology within 1 week.  OSA: non compliant to CPAP  Urinary retention due to BPH. Required straight cath 3, had to  replace Foley catheter, continue BPH medications at lower dose due to severe orthostatic hypotension and follow with Dr. Dorina Hoyer his primary urologist outpatient. Due to recurrent UTIs, urinary retention and now severe orthostatic hypotension with his BPH medications.  Orthostatic hypotension with syncopal episodes upon standing up. Baseline blood pressure also soft, hold diuretics, have hydrated him sufficiently, continue TED stockings, have added midodrine. Stable random cortisol. Have discontinued Flomax. Continue PT and monitor orthostatic blood pressures. Remains high fall risk.      Discharge Condition: Fair  Follow UP  Follow-up Information    Follow up with Dennie Bible, NP. Schedule an appointment as soon as possible for a visit in 1 month.  Specialty:  Family Medicine   Why:  stroke clinic   Contact information:   [address] [place] [address] [phone]       Follow up with [facility].   Specialty:  [place]   Contact information:   [address] [place] [phone]      Follow up with London Pepper, MD. Schedule an appointment as soon as possible for a visit in 1 week.   Specialty:  Family Medicine   Contact information:   [address] [place] [address] [phone]       Follow up with Jorja Loa, MD. Schedule an appointment as soon as possible for a visit in 1 week.   Specialty:  Urology   Contact information:   [place] [address] [phone]        Consults obtained -  Neuro, cardiology  Diet and Activity recommendation: See Discharge Instructions below  Discharge Instructions       Discharge Instructions    Ambulatory referral to Neurology    Complete by:  As directed   Follow up with Cecille Rubin NP at [facility] in one month. Thanks.     Diet - low sodium heart healthy    Complete by:  As directed       Discharge instructions    Complete by:  As directed   Follow with Primary MD London Pepper, MD in 7 days   Get CBC, CMP, 2 view Chest X ray checked  by Primary MD next visit.    Activity: As tolerated with Full fall precautions use walker/cane & assistance as needed   Disposition SNF   Diet:   Heart Healthy  with feeding assistance and aspiration precautions.  For Heart failure patients - Check your Weight same time everyday, if you gain over 2 pounds, or you develop in leg swelling, experience more shortness of breath or chest pain, call your Primary MD immediately. Follow Cardiac Low Salt Diet and 1.5 lit/day fluid restriction.   On your next visit with your primary care physician please Get Medicines reviewed and adjusted.   Please request your Prim.MD to go over all Hospital Tests and Procedure/Radiological results at the follow up, please get all Hospital records sent to your Prim MD by signing hospital release before you go home.   If you experience worsening of your admission symptoms, develop shortness of breath, life threatening emergency, suicidal or homicidal thoughts you must seek medical attention immediately by calling 911 or calling your MD immediately  if symptoms less severe.  You Must read complete instructions/literature along with all the possible adverse reactions/side effects for all the Medicines you take and that have been prescribed to you. Take any new Medicines after you have completely understood and accpet all the possible adverse reactions/side effects.   Do not drive, operating heavy machinery, perform activities at heights, swimming or participation in water activities or provide baby sitting services if your were admitted for syncope or siezures until you have seen by Primary MD or a Neurologist and advised to do so again.  Do not drive when taking Pain medications.    Do not take more than prescribed Pain, Sleep and Anxiety Medications  Special  Instructions: If you have smoked or chewed Tobacco  in the last 2 yrs please stop smoking, stop any regular Alcohol  and or any Recreational drug use.  Wear Seat belts while driving.  Please note  You were cared for by a hospitalist during your hospital stay. If you have any questions about your discharge medications or the care you received while you were in the hospital after you are discharged, you can call the unit and asked to speak with the hospitalist on call if the hospitalist that took care of you is not available. Once you are discharged, your primary care physician will handle any further medical issues. Please note that NO REFILLS for any discharge medications will be authorized once you are discharged, as it is imperative that you return to your primary care physician (or establish a relationship with a primary care physician if you do not have one) for your aftercare needs so that they can reassess your need for medications and monitor your lab values.     Increase activity slowly    Complete by:  As directed              Discharge Medications       Medication List    STOP taking these medications        diphenhydrAMINE 25 mg capsule  Commonly known as:  BENADRYL     doxazosin 8 MG tablet  Commonly known as:  CARDURA      TAKE these medications        aspirin 81 MG EC tablet  Take 1 tablet (81 mg total) by mouth daily.     cefpodoxime 200 MG tablet  Commonly known as:  VANTIN  Take 1 tablet (200 mg total) by mouth 2 (two) times daily. Stop date 03/03/2015     ezetimibe 10 MG tablet  Commonly known as:  ZETIA  Take 10 mg by mouth every morning.     finasteride 5 MG tablet  Commonly known as:  PROSCAR  Take 5 mg by mouth daily.     levothyroxine 125 MCG tablet  Commonly known as:  SYNTHROID, LEVOTHROID  Take 1 tablet (125 mcg total) by mouth daily before breakfast.     loperamide 1 MG/5ML solution  Commonly known as:  IMODIUM  Take 10 mLs (2 mg  total) by mouth every 6 (six) hours as needed for diarrhea or loose stools.     metoprolol succinate 50 MG 24 hr tablet  Commonly known as:  TOPROL-XL  TAKE 1 TABLET BY MOUTH EVERY MORNING & 1/2 TABLET EVERY EVENING WITH OR IMMEDIATELY FOLLOWING A MEAL     midodrine 10 MG tablet  Commonly known as:  PROAMATINE  Take 1 tablet (10 mg total) by mouth 3 (three) times daily with meals.     phenazopyridine 200 MG tablet  Commonly known as:  PYRIDIUM  Take 200 mg by mouth 3 (three) times daily with meals.     saccharomyces boulardii 250 MG capsule  Commonly known as:  FLORASTOR  Take 250 mg by mouth every morning.     sertraline 50 MG tablet  Commonly known as:  ZOLOFT  Take 50 mg by mouth daily.     silodosin 8 MG Caps capsule  Commonly known as:  RAPAFLO  Take 8 mg by mouth daily with breakfast.     XARELTO 15 MG Tabs tablet  Generic drug:  Rivaroxaban  TAKE 1 TABLET (15 MG TOTAL) BY MOUTH DAILY WITH SUPPER.        Major procedures and Radiology Reports - PLEASE review detailed and final reports for all details, in brief -    Echo  Left ventricle: The cavity size was  mildly dilated. Wallthickness was increased in a pattern of mild LVH. Systolicfunction was mildly to moderately reduced. The estimated ejection fraction was in the range of 40% to 45%. Moderate hypokinesis of the basal-midinferior myocardium. - Aortic valve: There was trivial regurgitation. Valve area (VTI): 2.1 cm^2. Valve area (Vmax): 2.31 cm^2. Valve area (Vmean): 1.83 cm^2. - Left atrium: The atrium was severely dilated. - Right ventricle: The cavity size was mildly dilated. - Right atrium: The atrium was moderately dilated. - Pulmonary arteries: Systolic pressure was mildly to moderately increased. PA peak pressure: 39 mm Hg (S).    Dg Chest 2 View  02/21/2015  CLINICAL DATA:  Shortness of breath, fever. EXAM: CHEST  2 VIEW COMPARISON:  February 20, 2015. FINDINGS: Stable cardiomegaly. No pneumothorax  is noted. Stable left midlung and bibasilar opacities are noted concerning for pneumonia. Increased left upper lobe opacity is noted concerning for possible pneumonia. Minimal bilateral pleural effusions are noted. Bony thorax is unremarkable. IMPRESSION: Stable left midlung in bibasilar opacities concerning for pneumonia. Increased left upper lobe opacity is noted concerning for pneumonia. Electronically Signed   By: Marijo Conception, M.D.   On: 02/21/2015 08:20   Dg Chest 2 View  02/17/2015  CLINICAL DATA:  Weakness, hypertension EXAM: CHEST  2 VIEW COMPARISON:  06/15/2014 FINDINGS: Multifocal patchy opacities, lingular and bilateral lower lobe predominant, suspicious for multifocal pneumonia. No definite pleural effusion. No pneumothorax. Mild cardiomegaly. Visualized osseous structures are within normal limits. IMPRESSION: Multifocal patchy opacities, lingular and bilateral lower lobe predominant, suspicious for multifocal pneumonia. Electronically Signed   By: Julian Hy M.D.   On: 02/17/2015 17:33   Ct Head Wo Contrast  02/17/2015  CLINICAL DATA:  Pt c/o generalized weakness, dizziness and hypotension. Pt's wife reports slurred speech x 5 minutes earlier. Sts this is the 2nd episode this week. Pt has multiple health issues. EXAM: CT HEAD WITHOUT CONTRAST TECHNIQUE: Contiguous axial images were obtained from the base of the skull through the vertex without intravenous contrast. COMPARISON:  None. FINDINGS: There is central and cortical atrophy. Significant periventricular white matter changes are consistent with small vessel disease. There is no intra or extra-axial fluid collection or mass lesion. The basilar cisterns and ventricles have a normal appearance. There is no CT evidence for acute infarction or hemorrhage. There is dense atherosclerotic calcification of the internal carotid arteries. The paranasal sinuses and mastoid air cells are normally aerated. IMPRESSION: 1. Atrophy and small vessel  disease. 2. No evidence for acute intracranial abnormality. Electronically Signed   By: Nolon Nations M.D.   On: 02/17/2015 16:54   Mr Jodene Nam Head Wo Contrast  02/18/2015  CLINICAL DATA:  TIA.  Slurred speech EXAM: MRA HEAD WITHOUT CONTRAST TECHNIQUE: Angiographic images of the Circle of Willis were obtained using MRA technique without intravenous contrast. COMPARISON:  MRI head 02/17/2015 FINDINGS: Mild atherosclerotic disease in the cavernous segment of the internal carotid artery bilaterally without significant stenosis. Anterior and middle cerebral arteries widely patent without stenosis or aneurysm Both vertebral arteries are patent to the basilar without significant stenosis. There is mild to moderate stenosis in the proximal to mid basilar due to atherosclerotic disease. PICA, superior cerebellar, and posterior cerebral arteries are widely patent without stenosis or aneurysm. IMPRESSION: Mild atherosclerotic disease involving the cavernous carotid bilaterally. Mild moderate stenosis of the basilar. No large vessel occlusion. Electronically Signed   By: Franchot Gallo M.D.   On: 02/18/2015 10:07   Mr Brain Wo Contrast  02/17/2015  CLINICAL DATA:  Generalized weakness, dizziness, and hypotension. Episode of slurred speech. EXAM: MRI HEAD WITHOUT CONTRAST TECHNIQUE: Multiplanar, multiecho pulse sequences of the brain and surrounding structures were obtained without intravenous contrast. COMPARISON:  CT head without contrast from the same day. FINDINGS: The diffusion-weighted images demonstrate no evidence for acute or subacute infarction. Moderate atrophy and severe diffuse periventricular and subcortical white matter disease is present bilaterally. There are remote lacunar infarcts within the basal ganglia bilaterally. A remote lacunar infarct is present within the right cerebellum. Mild white matter changes extend into the brainstem. The internal auditory canals are within normal limits bilaterally. Flow  is present in the major intracranial arteries. Scleral banding is noted on the right. Bilateral lens replacements are present. The globes and orbits are otherwise normal. The paranasal sinuses and mastoid air cells are clear. IMPRESSION: 1. No acute intracranial abnormality. 2. Moderate atrophy and severe diffuse periventricular and subcortical white matter disease bilaterally compatible with chronic microvascular ischemia. 3. Multiple remote lacunar infarcts involving the basal ganglia bilaterally of the right cerebellum. Electronically Signed   By: San Morelle M.D.   On: 02/17/2015 19:24   Dg Chest Port 1 View  02/24/2015  CLINICAL DATA:  Shortness of breath and hypotension EXAM: PORTABLE CHEST 1 VIEW COMPARISON:  February 21, 2015 FINDINGS: There is patchy airspace opacity in each mid lung. There is generalized interstitial prominence, likely representing edema. There is cardiomegaly. The pulmonary vascularity is within normal limits. Bones are osteoporotic. No adenopathy evident. IMPRESSION: Suspect a degree of congestive heart failure. Patchy airspace opacity in each mid lung region may represent alveolar edema but also could represent superimposed pneumonia. No new opacity is appreciable compared to recent prior study. No change in cardiac silhouette. Electronically Signed   By: Lowella Grip III M.D.   On: 02/24/2015 08:06   Dg Chest Port 1 View  02/20/2015  CLINICAL DATA:  Shortness of breath. EXAM: PORTABLE CHEST 1 VIEW COMPARISON:  February 17, 2015. FINDINGS: Stable cardiomediastinal silhouette. No pneumothorax is noted. Stable lingular and bibasilar opacities are noted concerning for pneumonia. Bony thorax is intact. IMPRESSION: Stable bilateral lung opacities consistent with multifocal pneumonia. Electronically Signed   By: Marijo Conception, M.D.   On: 02/20/2015 12:08    Micro Results      Recent Results (from the past 240 hour(s))  Blood Culture (routine x 2)     Status: None    Collection Time: 02/17/15  4:00 PM  Result Value Ref Range Status   Specimen Description BLOOD RIGHT FOREARM  Final   Special Requests BOTTLES DRAWN AEROBIC AND ANAEROBIC 5ML  Final   Culture   Final    NO GROWTH 5 DAYS Performed at [facility]    Report Status 02/22/2015 FINAL  Final  Urine culture     Status: None   Collection Time: 02/17/15  4:18 PM  Result Value Ref Range Status   Specimen Description URINE, CLEAN CATCH  Final   Special Requests NONE  Final   Culture   Final    MULTIPLE SPECIES PRESENT, SUGGEST RECOLLECTION Performed at [facility]    Report Status 02/18/2015 FINAL  Final  C difficile quick scan w PCR reflex     Status: Abnormal   Collection Time: 02/19/15  5:07 AM  Result Value Ref Range Status   C Diff antigen POSITIVE (A) NEGATIVE Final   C Diff toxin NEGATIVE NEGATIVE Final   C Diff interpretation   Final    C. difficile present,  but toxin not detected. This indicates colonization. In most cases, this does not require treatment. If patient has signs and symptoms consistent with colitis, consider treatment. Requires ENTERIC precautions.  MRSA PCR Screening     Status: None   Collection Time: 02/19/15  2:00 PM  Result Value Ref Range Status   MRSA by PCR NEGATIVE NEGATIVE Final    Comment:        The GeneXpert MRSA Assay (FDA approved for NASAL specimens only), is one component of a comprehensive MRSA colonization surveillance program. It is not intended to diagnose MRSA infection nor to guide or monitor treatment for MRSA infections.   Culture, sputum-assessment     Status: None   Collection Time: 02/19/15  2:21 PM  Result Value Ref Range Status   Specimen Description SPUTUM  Final   Special Requests NONE  Final   Sputum evaluation   Final    MICROSCOPIC FINDINGS SUGGEST THAT THIS SPECIMEN IS NOT REPRESENTATIVE OF LOWER RESPIRATORY SECRETIONS. PLEASE RECOLLECT. RESULT CALLED TO, READ BACK BY AND VERIFIED WITH: H HUNTER 02/19/15 @  67 M VESTAL    Report Status 02/19/2015 FINAL  Final       Today   Subjective    Margie Billet today has no headache,no chest abdominal pain,no new weakness tingling or numbness, feels much better wants to go home today.     Objective   Blood pressure 112/67, pulse 100, temperature 98.6 F (37 C), temperature source Oral, resp. rate 20, height 5\' 8"  (1.727 m), weight 79.333 kg (174 lb 14.4 oz), SpO2 95 %.   Intake/Output Summary (Last 24 hours) at 02/25/15 1139 Last data filed at 02/25/15 0800  Gross per 24 hour  Intake    480 ml  Output   1725 ml  Net  -1245 ml    Exam Awake Alert, Oriented x 3, No new F.N deficits, Normal affect [place].AT,PERRAL Supple Neck,No JVD, No cervical lymphadenopathy appriciated.  Symmetrical Chest wall movement, Good air movement bilaterally, CTAB RRR,No Gallops,Rubs or new Murmurs, No Parasternal Heave +ve B.Sounds, Abd Soft, Non tender, No organomegaly appriciated, No rebound -guarding or rigidity. No Cyanosis, Clubbing or edema, No new Rash or bruise   Data Review   CBC w Diff: Lab Results  Component Value Date   WBC 6.4 02/24/2015   HGB 10.6* 02/24/2015   HCT 32.2* 02/24/2015   PLT 238 02/24/2015   LYMPHOPCT 22 02/17/2015   MONOPCT 11 02/17/2015   EOSPCT 4 02/17/2015   BASOPCT 0 02/17/2015    CMP: Lab Results  Component Value Date   NA 134* 02/25/2015   K 3.6 02/25/2015   CL 102 02/25/2015   CO2 21* 02/25/2015   BUN 29* 02/25/2015   CREATININE 2.02* 02/25/2015   PROT 7.3 02/17/2015   ALBUMIN 3.4* 02/17/2015   BILITOT 1.0 02/17/2015   ALKPHOS 216* 02/17/2015   AST 43* 02/17/2015   ALT 38 02/17/2015  . Lab Results  Component Value Date   CHOL 101 02/18/2015   HDL 34* 02/18/2015   LDLCALC 56 02/18/2015   LDLDIRECT 139.7 01/27/2008   TRIG 55 02/18/2015   CHOLHDL 3.0 02/18/2015    Lab Results  Component Value Date   HGBA1C 4.5* 02/18/2015     Total Time in preparing paper work, data evaluation and todays exam  - 35 minutes  Lala Lund K M.D on 02/25/2015 at 11:39 AM  [facility]   Office  [phone]

## 2015-03-01 ENCOUNTER — Encounter: Payer: Self-pay | Admitting: Internal Medicine

## 2015-03-01 ENCOUNTER — Non-Acute Institutional Stay (SKILLED_NURSING_FACILITY): Payer: Medicare Other | Admitting: Internal Medicine

## 2015-03-01 DIAGNOSIS — J189 Pneumonia, unspecified organism: Secondary | ICD-10-CM | POA: Diagnosis not present

## 2015-03-01 DIAGNOSIS — R339 Retention of urine, unspecified: Secondary | ICD-10-CM

## 2015-03-01 DIAGNOSIS — I25119 Atherosclerotic heart disease of native coronary artery with unspecified angina pectoris: Secondary | ICD-10-CM

## 2015-03-01 DIAGNOSIS — I482 Chronic atrial fibrillation, unspecified: Secondary | ICD-10-CM

## 2015-03-01 DIAGNOSIS — N401 Enlarged prostate with lower urinary tract symptoms: Secondary | ICD-10-CM | POA: Diagnosis not present

## 2015-03-01 DIAGNOSIS — E038 Other specified hypothyroidism: Secondary | ICD-10-CM | POA: Diagnosis not present

## 2015-03-01 DIAGNOSIS — N189 Chronic kidney disease, unspecified: Secondary | ICD-10-CM | POA: Diagnosis not present

## 2015-03-01 DIAGNOSIS — K529 Noninfective gastroenteritis and colitis, unspecified: Secondary | ICD-10-CM

## 2015-03-01 DIAGNOSIS — E46 Unspecified protein-calorie malnutrition: Secondary | ICD-10-CM

## 2015-03-01 DIAGNOSIS — N390 Urinary tract infection, site not specified: Secondary | ICD-10-CM | POA: Diagnosis not present

## 2015-03-01 DIAGNOSIS — F32A Depression, unspecified: Secondary | ICD-10-CM

## 2015-03-01 DIAGNOSIS — F329 Major depressive disorder, single episode, unspecified: Secondary | ICD-10-CM

## 2015-03-01 DIAGNOSIS — N138 Other obstructive and reflux uropathy: Secondary | ICD-10-CM

## 2015-03-01 DIAGNOSIS — I5042 Chronic combined systolic (congestive) and diastolic (congestive) heart failure: Secondary | ICD-10-CM | POA: Diagnosis not present

## 2015-03-01 DIAGNOSIS — I951 Orthostatic hypotension: Secondary | ICD-10-CM

## 2015-03-01 DIAGNOSIS — R5381 Other malaise: Secondary | ICD-10-CM | POA: Diagnosis not present

## 2015-03-01 LAB — CULTURE, BLOOD (ROUTINE X 2)
Culture: NO GROWTH
Culture: NO GROWTH

## 2015-03-01 NOTE — Progress Notes (Signed)
Patient ID: Harold Jennings, male   DOB: 1932/10/26, 82 y.o.   MRN: [id number]    LOCATION: [facility]  PCP: London Pepper, MD   Code Status: Full Code  Goals of care: Advanced Directive information Advanced Directives 02/17/2015  Does patient have an advance directive? Yes  Type of Paramedic of [place];Living will  Does patient want to make changes to advanced directive? No - Patient declined  Copy of advanced directive(s) in chart? No - copy requested  Would patient like information on creating an advanced directive? -    Extended Emergency Contact Information Primary Emergency Contact: Harper,Diene Address: [address]          [place], [place] [address] [place] Phone: [phone] Mobile Phone: [phone] Relation: Spouse   Allergies  Allergen Reactions  . Oxybutynin Anaphylaxis    Chief Complaint  Patient presents with  . New Admit To SNF    New Admission     HPI:  Patient is a 82 y.o. male seen today for short term rehabilitation post hospital admission from 02/26/15-02/25/15 with slurred speech and hypotension. He was diagnosed with community acquired pneumonia and started on iv antibiotics. Acute CVA was ruled out with negative MRI. He also had acute chf exacerbation and received iv diuresis following which he had low blood pressure and his diuretics were held. He was also treated for UTI. He had urinary retention and now has a foley catheter. He is seen in his room today with his wife and son present. He feels weak and tired. He has poor appetite. He has PMH of afib, CHF, GERD, HTN among others. He also has hx of sigmoid colectomy and takedown of colovesical fistula, incarcerated umbilical hernia repair and cystorrhapy on 06/12/14.   Review of Systems:  Constitutional: Negative for fever, chills, diaphoresis.  HENT: Negative for headache, congestion, nasal discharge, difficulty swallowing.   Eyes: Negative for blurred  vision, double vision and discharge.  Respiratory: Positive for cough with clear phlegm and dyspnea with mild exertion. Negative for wheezing.   Cardiovascular: Negative for chest pain, palpitations.  Gastrointestinal: Negative for heartburn, nausea, vomiting, abdominal pain. Had bowel movement yesterday. Had nausea until yesterday but none today. Denies loose stool Genitourinary: Negative for flank pain. has hx of recurrent UTI Musculoskeletal: Negative for back pain, fall in the facility Skin: Negative for itching, rash.  Neurological: Negative for dizziness Psychiatric/Behavioral: Negative for depression   Past Medical History  Diagnosis Date  . Dyslipidemia   . HTN (hypertension)   . Obesity   . Increased liver enzymes     felt to be a fatty liver per ultrasound in 2011  . Dyspnea   . Ischemic cardiomyopathy   . Spinal stenosis   . Sleep apnea     bipap   . CAD (coronary artery disease)     Silent MI sometime prior to 2000. S/P cath in 2000 showing a total mid RCA with collaterals from the LCX. EF is 40 to50%; Last Myoview in 2010 showing inferior scar without ischemia. EF was 52%.  Marland Kitchen Dysrhythmia     atrial fib   . Edema     lower extremity   . Cancer ([facility])     skin cancer   . Anemia   . Diverticular disease dec 2015  . Umbilical hernia   . Myocardial infarction ([facility])   . CHF (congestive heart failure) [facility])    Past Surgical History  Procedure Laterality Date  .  Total hip arthroplasty    . Eye surgery    . Lower back surgery      spinal stenosis  . Shoulder surgery    . Cardioversion N/A 06/06/2012    Procedure: CARDIOVERSION;  Surgeon: Thayer Headings, MD;  Location: [facility];  Service: Cardiovascular;  Laterality: N/A;  . Cardiac catheterization    . Partial colectomy N/A 06/12/2014    Procedure: SIGMOID COLECTOMY AND TAKEDOWN OF COLOVESICAL FISTULA ;  Surgeon: Armandina Gemma, MD;  Location: [facility];  Service: General;  Laterality: N/A;  . Umbilical hernia repair  N/A 06/12/2014    Procedure: INCARCERATED UMBILICAL HERNIA REPAIR  ;  Surgeon: Armandina Gemma, MD;  Location: [facility];  Service: General;  Laterality: N/A;  . Cystostomy N/A 06/12/2014    Procedure: Jasmine December;  Surgeon: Franchot Gallo, MD;  Location: [facility];  Service: Urology;  Laterality: N/A;   Social History:   reports that he has never smoked. He has never used smokeless tobacco. He reports that he drinks alcohol. He reports that he does not use illicit drugs.  Family History  Problem Relation Age of Onset  . Stroke Father   . Heart attack Father   . Hypertension Father   . Diabetes Father   . Drug abuse Mother   . Hypertension Brother   . Hypertension Brother   . Hypertension Brother   . Hypertension Sister     Medications:   Medication List       This list is accurate as of: 03/01/15  8:39 AM.  Always use your most recent med list.               aspirin 81 MG EC tablet  Take 1 tablet (81 mg total) by mouth daily.     cefpodoxime 200 MG tablet  Commonly known as:  VANTIN  Take 1 tablet (200 mg total) by mouth 2 (two) times daily. Stop date 03/03/2015     ezetimibe 10 MG tablet  Commonly known as:  ZETIA  Take 10 mg by mouth every morning.     finasteride 5 MG tablet  Commonly known as:  PROSCAR  Take 5 mg by mouth daily.     levothyroxine 125 MCG tablet  Commonly known as:  SYNTHROID, LEVOTHROID  Take 1 tablet (125 mcg total) by mouth daily before breakfast.     loperamide 1 MG/5ML solution  Commonly known as:  IMODIUM  Take 10 mLs (2 mg total) by mouth every 6 (six) hours as needed for diarrhea or loose stools.     metoprolol succinate 50 MG 24 hr tablet  Commonly known as:  TOPROL-XL  TAKE 1 TABLET BY MOUTH EVERY MORNING & 1/2 TABLET EVERY EVENING WITH OR IMMEDIATELY FOLLOWING A MEAL     midodrine 10 MG tablet  Commonly known as:  PROAMATINE  Take 1 tablet (10 mg total) by mouth 3 (three) times daily with meals.     phenazopyridine 200 MG tablet    Commonly known as:  PYRIDIUM  Take 200 mg by mouth 3 (three) times daily with meals.     saccharomyces boulardii 250 MG capsule  Commonly known as:  FLORASTOR  Take 250 mg by mouth every morning.     sertraline 50 MG tablet  Commonly known as:  ZOLOFT  Take 50 mg by mouth daily.     silodosin 8 MG Caps capsule  Commonly known as:  RAPAFLO  Take 8 mg by mouth daily with breakfast.  XARELTO 15 MG Tabs tablet  Generic drug:  Rivaroxaban  TAKE 1 TABLET (15 MG TOTAL) BY MOUTH DAILY WITH SUPPER.        Immunizations: Immunization History  Administered Date(s) Administered  . Influenza Split 10/16/2012  . Influenza-Unspecified 10/16/2012, 11/03/2013  . PPD Test 06/23/2014, 02/25/2015  . Pneumococcal Polysaccharide-23 07/13/2014     Physical Exam: Filed Vitals:   03/01/15 0826  BP: 118/66  Pulse: 85  Temp: 98.3 F (36.8 C)  TempSrc: Oral  Resp: 18  Height: 5\' 8"  (1.727 m)  Weight: 174 lb (78.926 kg)  SpO2: 90%   Body mass index is 26.46 kg/(m^2).  General- elderly male, frail and ill appearing, in no acute distress Head- normocephalic, atraumatic Nose- no maxillary or frontal sinus tenderness, no nasal discharge Throat- moist mucus membrane  Eyes- PERRLA, EOMI, no pallor, no icterus, no discharge, normal conjunctiva, normal sclera Neck- no cervical lymphadenopathy Cardiovascular- normal [id number], + systolic murmur, palpable dorsalis pedis and radial pulses, trace leg edema Respiratory- bilateral clear to auscultation, no wheeze, no rhonchi, no crackles, no use of accessory muscles, on o2 Abdomen- bowel sounds present, soft, non tender, old surgical scars, foley catheter in place Musculoskeletal- able to move all 4 extremities, generalized weakness, ted hose to both legs Neurological- alert and oriented to person, place and time Skin- warm and dry Psychiatry- normal mood and affect    Labs reviewed: Basic Metabolic Panel:  Recent Labs  07/12/14 1745   02/19/15 0640 02/21/15 0218 02/24/15 0820 02/25/15 0648  NA 132*  < > 137 134*  --  134*  K 4.4  < > 3.7 3.7  --  3.6  CL 97*  < > 104 100*  --  102  CO2 26  < > 21* 21*  --  21*  GLUCOSE 99  < > 103* 98  --  86  BUN 49*  < > 29* 26*  --  29*  CREATININE 2.67*  < > 2.03* 2.01*  --  2.02*  CALCIUM 8.6*  < > 8.5* 8.3*  --  7.9*  MG 2.1  --   --  1.6* 1.7  --   PHOS 4.5  --   --   --   --   --   < > = values in this interval not displayed. Liver Function Tests:  Recent Labs  07/12/14 1335 07/12/14 1745 02/17/15 1553  AST 35 32 43*  ALT 19 20 38  ALKPHOS 147* 153* 216*  BILITOT 0.8 0.8 1.0  PROT 7.3 7.5 7.3  ALBUMIN 2.8* 2.9* 3.4*    Recent Labs  04/08/14 2023 07/12/14 1335  LIPASE 51 64*   No results for input(s): AMMONIA in the last 8760 hours. CBC:  Recent Labs  07/12/14 1335 07/12/14 1745  02/17/15 1553 02/18/15 1058 02/21/15 0218 02/24/15 0820  WBC 7.3 7.6  < > 8.1 6.6 8.9 6.4  NEUTROABS 4.3 4.7  --  5.1  --   --   --   HGB 12.2* 12.6*  < > 10.3* 9.3* 10.1* 10.6*  HCT 35.6* 36.7*  < > 31.6* 28.4* 30.1* 32.2*  MCV 96.5 96.3  < > 101.9* 101.4* 100.7* 98.5  PLT 330 324  < > 193 165 180 238  < > = values in this interval not displayed. Cardiac Enzymes: No results for input(s): CKTOTAL, CKMB, CKMBINDEX, TROPONINI in the last 8760 hours. BNP: Invalid input(s): POCBNP CBG:  Recent Labs  07/14/14 0736 02/17/15 1543 02/18/15 1137  GLUCAP 96  92 127*    Radiological Exams: Dg Chest 2 View  02/21/2015  CLINICAL DATA:  Shortness of breath, fever. EXAM: CHEST  2 VIEW COMPARISON:  February 20, 2015. FINDINGS: Stable cardiomegaly. No pneumothorax is noted. Stable left midlung and bibasilar opacities are noted concerning for pneumonia. Increased left upper lobe opacity is noted concerning for possible pneumonia. Minimal bilateral pleural effusions are noted. Bony thorax is unremarkable. IMPRESSION: Stable left midlung in bibasilar opacities concerning for  pneumonia. Increased left upper lobe opacity is noted concerning for pneumonia. Electronically Signed   By: Marijo Conception, M.D.   On: 02/21/2015 08:20   Dg Chest 2 View  02/17/2015  CLINICAL DATA:  Weakness, hypertension EXAM: CHEST  2 VIEW COMPARISON:  06/15/2014 FINDINGS: Multifocal patchy opacities, lingular and bilateral lower lobe predominant, suspicious for multifocal pneumonia. No definite pleural effusion. No pneumothorax. Mild cardiomegaly. Visualized osseous structures are within normal limits. IMPRESSION: Multifocal patchy opacities, lingular and bilateral lower lobe predominant, suspicious for multifocal pneumonia. Electronically Signed   By: Julian Hy M.D.   On: 02/17/2015 17:33   Ct Head Wo Contrast  02/17/2015  CLINICAL DATA:  Pt c/o generalized weakness, dizziness and hypotension. Pt's wife reports slurred speech x 5 minutes earlier. Sts this is the 2nd episode this week. Pt has multiple health issues. EXAM: CT HEAD WITHOUT CONTRAST TECHNIQUE: Contiguous axial images were obtained from the base of the skull through the vertex without intravenous contrast. COMPARISON:  None. FINDINGS: There is central and cortical atrophy. Significant periventricular white matter changes are consistent with small vessel disease. There is no intra or extra-axial fluid collection or mass lesion. The basilar cisterns and ventricles have a normal appearance. There is no CT evidence for acute infarction or hemorrhage. There is dense atherosclerotic calcification of the internal carotid arteries. The paranasal sinuses and mastoid air cells are normally aerated. IMPRESSION: 1. Atrophy and small vessel disease. 2. No evidence for acute intracranial abnormality. Electronically Signed   By: Nolon Nations M.D.   On: 02/17/2015 16:54   Mr Jodene Nam Head Wo Contrast  02/18/2015  CLINICAL DATA:  TIA.  Slurred speech EXAM: MRA HEAD WITHOUT CONTRAST TECHNIQUE: Angiographic images of the Circle of Willis were obtained  using MRA technique without intravenous contrast. COMPARISON:  MRI head 02/17/2015 FINDINGS: Mild atherosclerotic disease in the cavernous segment of the internal carotid artery bilaterally without significant stenosis. Anterior and middle cerebral arteries widely patent without stenosis or aneurysm Both vertebral arteries are patent to the basilar without significant stenosis. There is mild to moderate stenosis in the proximal to mid basilar due to atherosclerotic disease. PICA, superior cerebellar, and posterior cerebral arteries are widely patent without stenosis or aneurysm. IMPRESSION: Mild atherosclerotic disease involving the cavernous carotid bilaterally. Mild moderate stenosis of the basilar. No large vessel occlusion. Electronically Signed   By: Franchot Gallo M.D.   On: 02/18/2015 10:07   Mr Brain Wo Contrast  02/17/2015  CLINICAL DATA:  Generalized weakness, dizziness, and hypotension. Episode of slurred speech. EXAM: MRI HEAD WITHOUT CONTRAST TECHNIQUE: Multiplanar, multiecho pulse sequences of the brain and surrounding structures were obtained without intravenous contrast. COMPARISON:  CT head without contrast from the same day. FINDINGS: The diffusion-weighted images demonstrate no evidence for acute or subacute infarction. Moderate atrophy and severe diffuse periventricular and subcortical white matter disease is present bilaterally. There are remote lacunar infarcts within the basal ganglia bilaterally. A remote lacunar infarct is present within the right cerebellum. Mild white matter changes extend into the brainstem.  The internal auditory canals are within normal limits bilaterally. Flow is present in the major intracranial arteries. Scleral banding is noted on the right. Bilateral lens replacements are present. The globes and orbits are otherwise normal. The paranasal sinuses and mastoid air cells are clear. IMPRESSION: 1. No acute intracranial abnormality. 2. Moderate atrophy and severe  diffuse periventricular and subcortical white matter disease bilaterally compatible with chronic microvascular ischemia. 3. Multiple remote lacunar infarcts involving the basal ganglia bilaterally of the right cerebellum. Electronically Signed   By: San Morelle M.D.   On: 02/17/2015 19:24   Dg Chest Port 1 View  02/24/2015  CLINICAL DATA:  Shortness of breath and hypotension EXAM: PORTABLE CHEST 1 VIEW COMPARISON:  February 21, 2015 FINDINGS: There is patchy airspace opacity in each mid lung. There is generalized interstitial prominence, likely representing edema. There is cardiomegaly. The pulmonary vascularity is within normal limits. Bones are osteoporotic. No adenopathy evident. IMPRESSION: Suspect a degree of congestive heart failure. Patchy airspace opacity in each mid lung region may represent alveolar edema but also could represent superimposed pneumonia. No new opacity is appreciable compared to recent prior study. No change in cardiac silhouette. Electronically Signed   By: Lowella Grip III M.D.   On: 02/24/2015 08:06   Dg Chest Port 1 View  02/20/2015  CLINICAL DATA:  Shortness of breath. EXAM: PORTABLE CHEST 1 VIEW COMPARISON:  February 17, 2015. FINDINGS: Stable cardiomediastinal silhouette. No pneumothorax is noted. Stable lingular and bibasilar opacities are noted concerning for pneumonia. Bony thorax is intact. IMPRESSION: Stable bilateral lung opacities consistent with multifocal pneumonia. Electronically Signed   By: Marijo Conception, M.D.   On: 02/20/2015 12:08    Assessment/Plan  Physical deconditioning Will have him work with physical therapy and occupational therapy team to help with gait training and muscle strengthening exercises.fall precautions. Skin care. Encourage to be out of bed.   CAP Has completed his antibiotic. Encourage to use incentive spirometer. Get CXR in 2 week to assess for resolution. Continue o2 and wean off as tolerated  CHF Appears euvolemic,  daily weight for now. With orthostatic bp, change his toprol xl to 25 mg bid for now and monitor  Orthostatic Hypotension Stable bp at present but per therapy team BP drops during therapy and patient complaints of dizziness with position change. On chart review, had orthostatic hypotension during hospital stay as well. Get orthostatic vital daily x 5 days. To wear ted hose. Check cmp and cbc. Continue midodrine 10 mg tid for now.   UTI Continue and complete course of vantin on 03/03/15. Hydration to be maintained. Continue pyridium  Urinary retention Has foley in place, make follow up with urology to follow further, continue foley care  Chronic diarrhea Stable, continue imodium, check bmp and mg level. Continue florastor with him on antibiotic. C.diff workup in hospital negative.  Protein calorie malnutrition Monitor po intake and weight. Get dietary consult for poor appetite and to introduce protein supplement  Anemia of chronic disease Monitor cbc  CAD Remains chest pain free. Continue aspirin and b blcoker  Chronic depression Continue zoloft, no changes made  CKD  Monitor renal function  afib Rate controlled. Decreased toprol to 25 mg bid with low BP. Continue xarelto for anticoagulation  Hypothyroidism Continue levothyroxine  BPH with urinary retention Continue proscar and rapaflo for now, has f/u with urology    Goals of care: short term rehabilitation   Labs/tests ordered: cbc, cmp, mg 03/02/15  Family/ staff Communication: reviewed care  plan with patient, his wife and nursing supervisor    Blanchie Serve, MD Internal Medicine [place], [place] [address] Cell Phone (Monday-Friday 8 am - 5 pm): [phone] On Call: [phone] and follow prompts after 5 pm and on weekends Office Phone: [phone] Office Fax: [phone]

## 2015-03-03 ENCOUNTER — Non-Acute Institutional Stay (SKILLED_NURSING_FACILITY): Payer: Medicare Other | Admitting: Family

## 2015-03-03 DIAGNOSIS — R197 Diarrhea, unspecified: Secondary | ICD-10-CM

## 2015-03-03 DIAGNOSIS — R609 Edema, unspecified: Secondary | ICD-10-CM | POA: Insufficient documentation

## 2015-03-03 DIAGNOSIS — I5042 Chronic combined systolic (congestive) and diastolic (congestive) heart failure: Secondary | ICD-10-CM

## 2015-03-03 DIAGNOSIS — R600 Localized edema: Secondary | ICD-10-CM | POA: Diagnosis not present

## 2015-03-03 NOTE — Progress Notes (Signed)
Patient ID: Harold Jennings, male   DOB: 1932-05-28, 82 y.o.   MRN: [id number]  Location: [facility]  Provider:  Blanchie Serve, MD   London Pepper, MD  Code Status:  Full Code  Goals of care: Advanced Directive information Advanced Directives 02/17/2015  Does patient have an advance directive? Yes  Type of Paramedic of [place];Living will  Does patient want to make changes to advanced directive? No - Patient declined  Copy of advanced directive(s) in chart? No - copy requested  Would patient like information on creating an advanced directive? -     Chief Complaint  Patient presents with  . Acute Visit    Shortness of breath     HPI:  Pt is a 82 y.o. male seen today at [facility] for shortness of breath overnight. He has a past medical History of Afib, CHF, GERD, HTN, dyspnea and  among others. He is seen today in his room. He had a portable CXR PA/Lat overnight 03/02/2015 ordered by on Call provider for complains of SOB. REDS vest also ordered but patient is not a candidate facility Nurse states he will not keep it on. He denies any fever, chills, SOB or Wheezing today. Physical Therapist reports oxygen saturation drops to 80's whenever therapy is attempted. He has also had diarrhea for the past three days relieved by imodium. Has Bilateral Ted hose but not on today states edema has improved.    Review of Systems  Constitutional: Positive for malaise/fatigue. Negative for fever, chills and diaphoresis.  HENT: Negative.   Eyes: Negative.   Respiratory: Positive for cough. Negative for hemoptysis, sputum production, shortness of breath and wheezing.   Cardiovascular: Positive for claudication and leg swelling. Negative for chest pain, palpitations, orthopnea and PND.  Gastrointestinal: Positive for diarrhea. Negative for heartburn, nausea, vomiting, abdominal pain, constipation and blood in stool.  Genitourinary: Negative  for dysuria, urgency, hematuria and flank pain.       Has Foley Catheter reports pain at the insertion site.   Musculoskeletal: Negative for falls.  Skin: Negative.   Neurological: Positive for weakness. Negative for dizziness.  Psychiatric/Behavioral: Negative.     Past Medical History  Diagnosis Date  . Dyslipidemia   . HTN (hypertension)   . Obesity   . Increased liver enzymes     felt to be a fatty liver per ultrasound in 2011  . Dyspnea   . Ischemic cardiomyopathy   . Spinal stenosis   . Sleep apnea     bipap   . CAD (coronary artery disease)     Silent MI sometime prior to 2000. S/P cath in 2000 showing a total mid RCA with collaterals from the LCX. EF is 40 to50%; Last Myoview in 2010 showing inferior scar without ischemia. EF was 52%.  Marland Kitchen Dysrhythmia     atrial fib   . Edema     lower extremity   . Cancer ([place])     skin cancer   . Anemia   . Diverticular disease dec 2015  . Umbilical hernia   . Myocardial infarction ([place])   . CHF (congestive heart failure) [facility])    Past Surgical History  Procedure Laterality Date  . Total hip arthroplasty    . Eye surgery    . Lower back surgery      spinal stenosis  . Shoulder surgery    . Cardioversion N/A 06/06/2012    Procedure: CARDIOVERSION;  Surgeon: Wonda Cheng Nahser,  MD;  Location: [place];  Service: Cardiovascular;  Laterality: N/A;  . Cardiac catheterization    . Partial colectomy N/A 06/12/2014    Procedure: SIGMOID COLECTOMY AND TAKEDOWN OF COLOVESICAL FISTULA ;  Surgeon: Armandina Gemma, MD;  Location: [facility];  Service: General;  Laterality: N/A;  . Umbilical hernia repair N/A 06/12/2014    Procedure: INCARCERATED UMBILICAL HERNIA REPAIR  ;  Surgeon: Armandina Gemma, MD;  Location: [facility];  Service: General;  Laterality: N/A;  . Cystostomy N/A 06/12/2014    Procedure: Jasmine December;  Surgeon: Franchot Gallo, MD;  Location: [facility];  Service: Urology;  Laterality: N/A;    Allergies  Allergen Reactions  . Oxybutynin  Anaphylaxis      Medication List       This list is accurate as of: 03/03/15 12:31 PM.  Always use your most recent med list.               aspirin 81 MG EC tablet  Take 1 tablet (81 mg total) by mouth daily.     cefpodoxime 200 MG tablet  Commonly known as:  VANTIN  Take 1 tablet (200 mg total) by mouth 2 (two) times daily. Stop date 03/03/2015     ezetimibe 10 MG tablet  Commonly known as:  ZETIA  Take 10 mg by mouth every morning.     finasteride 5 MG tablet  Commonly known as:  PROSCAR  Take 5 mg by mouth daily.     levothyroxine 125 MCG tablet  Commonly known as:  SYNTHROID, LEVOTHROID  Take 1 tablet (125 mcg total) by mouth daily before breakfast.     loperamide 1 MG/5ML solution  Commonly known as:  IMODIUM  Take 10 mLs (2 mg total) by mouth every 6 (six) hours as needed for diarrhea or loose stools.     metoprolol succinate 50 MG 24 hr tablet  Commonly known as:  TOPROL-XL  TAKE 1 TABLET BY MOUTH EVERY MORNING & 1/2 TABLET EVERY EVENING WITH OR IMMEDIATELY FOLLOWING A MEAL     midodrine 10 MG tablet  Commonly known as:  PROAMATINE  Take 1 tablet (10 mg total) by mouth 3 (three) times daily with meals.     phenazopyridine 200 MG tablet  Commonly known as:  PYRIDIUM  Take 200 mg by mouth 3 (three) times daily with meals.     saccharomyces boulardii 250 MG capsule  Commonly known as:  FLORASTOR  Take 250 mg by mouth every morning.     sertraline 50 MG tablet  Commonly known as:  ZOLOFT  Take 50 mg by mouth daily.     silodosin 8 MG Caps capsule  Commonly known as:  RAPAFLO  Take 8 mg by mouth daily with breakfast.     XARELTO 15 MG Tabs tablet  Generic drug:  Rivaroxaban  TAKE 1 TABLET (15 MG TOTAL) BY MOUTH DAILY WITH SUPPER.        Immunization History  Administered Date(s) Administered  . Influenza Split 10/16/2012  . Influenza-Unspecified 10/16/2012, 11/03/2013  . PPD Test 06/23/2014, 02/25/2015  . Pneumococcal Polysaccharide-23  07/13/2014   Pertinent  Health Maintenance Due  Topic Date Due  . INFLUENZA VACCINE  08/17/2014  . PNA vac Low Risk Adult (2 of 2 - PCV13) 07/13/2015   Fall Risk  07/30/2014 07/24/2014  Falls in the past year? - Yes  Number falls in past yr: 1 2 or more  Injury with Fall? - Yes  Risk for fall due to : -  History of fall(s)  Risk for fall due to (comments): - weak  Follow up - Follow up appointment;Falls prevention discussed    Filed Vitals:   03/03/15 1212  BP: 116/73  Pulse: 81  Temp: 97.6 F (36.4 C)  Resp: 20  Weight: 174 lb (78.926 kg)  SpO2: 94%   Body mass index is 26.46 kg/(m^2). Physical Exam  Constitutional: He is oriented to person, place, and time. He appears well-developed. No distress.  Elderly   HENT:  Mouth/Throat: Oropharynx is clear and moist.  Eyes: EOM are normal. Pupils are equal, round, and reactive to light. Right eye exhibits no discharge. Left eye exhibits no discharge. No scleral icterus.  Neck: Normal range of motion. No JVD present. No tracheal deviation present.  Cardiovascular: Normal rate, regular rhythm, normal heart sounds and intact distal pulses.  Exam reveals no gallop and no friction rub.   No murmur heard. Pulmonary/Chest: Effort normal. No respiratory distress. He has no wheezes.  Left mid lung crackles noted.   Abdominal: Soft. Bowel sounds are normal. He exhibits no distension and no mass. There is no tenderness. There is no rebound and no guarding.  Musculoskeletal: Normal range of motion. He exhibits no tenderness.  Bilateral Lower extremities +2 edema ankles.   Lymphadenopathy:    He has no cervical adenopathy.  Neurological: He is oriented to person, place, and time.  Skin: Skin is warm and dry.  Psychiatric: He has a normal mood and affect.    Labs reviewed:  Recent Labs  07/12/14 1745  02/19/15 0640 02/21/15 0218 02/24/15 0820 02/25/15 0648  NA 132*  < > 137 134*  --  134*  K 4.4  < > 3.7 3.7  --  3.6  CL 97*  < >  104 100*  --  102  CO2 26  < > 21* 21*  --  21*  GLUCOSE 99  < > 103* 98  --  86  BUN 49*  < > 29* 26*  --  29*  CREATININE 2.67*  < > 2.03* 2.01*  --  2.02*  CALCIUM 8.6*  < > 8.5* 8.3*  --  7.9*  MG 2.1  --   --  1.6* 1.7  --   PHOS 4.5  --   --   --   --   --   < > = values in this interval not displayed.  Recent Labs  07/12/14 1335 07/12/14 1745 02/17/15 1553  AST 35 32 43*  ALT 19 20 38  ALKPHOS 147* 153* 216*  BILITOT 0.8 0.8 1.0  PROT 7.3 7.5 7.3  ALBUMIN 2.8* 2.9* 3.4*    Recent Labs  07/12/14 1335 07/12/14 1745  02/17/15 1553 02/18/15 1058 02/21/15 0218 02/24/15 0820  WBC 7.3 7.6  < > 8.1 6.6 8.9 6.4  NEUTROABS 4.3 4.7  --  5.1  --   --   --   HGB 12.2* 12.6*  < > 10.3* 9.3* 10.1* 10.6*  HCT 35.6* 36.7*  < > 31.6* 28.4* 30.1* 32.2*  MCV 96.5 96.3  < > 101.9* 101.4* 100.7* 98.5  PLT 330 324  < > 193 165 180 238  < > = values in this interval not displayed. Lab Results  Component Value Date   TSH 8.237* 02/24/2015   Lab Results  Component Value Date   HGBA1C 4.5* 02/18/2015   Lab Results  Component Value Date   CHOL 101 02/18/2015   HDL 34* 02/18/2015   LDLCALC 56 02/18/2015  LDLDIRECT 139.7 01/27/2008   TRIG 55 02/18/2015   CHOLHDL 3.0 02/18/2015    Significant Diagnostic Results in last 30 days:  Dg Chest 2 View  02/21/2015  CLINICAL DATA:  Shortness of breath, fever. EXAM: CHEST  2 VIEW COMPARISON:  February 20, 2015. FINDINGS: Stable cardiomegaly. No pneumothorax is noted. Stable left midlung and bibasilar opacities are noted concerning for pneumonia. Increased left upper lobe opacity is noted concerning for possible pneumonia. Minimal bilateral pleural effusions are noted. Bony thorax is unremarkable. IMPRESSION: Stable left midlung in bibasilar opacities concerning for pneumonia. Increased left upper lobe opacity is noted concerning for pneumonia. Electronically Signed   By: Marijo Conception, M.D.   On: 02/21/2015 08:20   Dg Chest 2  View  02/17/2015  CLINICAL DATA:  Weakness, hypertension EXAM: CHEST  2 VIEW COMPARISON:  06/15/2014 FINDINGS: Multifocal patchy opacities, lingular and bilateral lower lobe predominant, suspicious for multifocal pneumonia. No definite pleural effusion. No pneumothorax. Mild cardiomegaly. Visualized osseous structures are within normal limits. IMPRESSION: Multifocal patchy opacities, lingular and bilateral lower lobe predominant, suspicious for multifocal pneumonia. Electronically Signed   By: Julian Hy M.D.   On: 02/17/2015 17:33   Ct Head Wo Contrast  02/17/2015  CLINICAL DATA:  Pt c/o generalized weakness, dizziness and hypotension. Pt's wife reports slurred speech x 5 minutes earlier. Sts this is the 2nd episode this week. Pt has multiple health issues. EXAM: CT HEAD WITHOUT CONTRAST TECHNIQUE: Contiguous axial images were obtained from the base of the skull through the vertex without intravenous contrast. COMPARISON:  None. FINDINGS: There is central and cortical atrophy. Significant periventricular white matter changes are consistent with small vessel disease. There is no intra or extra-axial fluid collection or mass lesion. The basilar cisterns and ventricles have a normal appearance. There is no CT evidence for acute infarction or hemorrhage. There is dense atherosclerotic calcification of the internal carotid arteries. The paranasal sinuses and mastoid air cells are normally aerated. IMPRESSION: 1. Atrophy and small vessel disease. 2. No evidence for acute intracranial abnormality. Electronically Signed   By: Nolon Nations M.D.   On: 02/17/2015 16:54   Mr Jodene Nam Head Wo Contrast  02/18/2015  CLINICAL DATA:  TIA.  Slurred speech EXAM: MRA HEAD WITHOUT CONTRAST TECHNIQUE: Angiographic images of the Circle of Willis were obtained using MRA technique without intravenous contrast. COMPARISON:  MRI head 02/17/2015 FINDINGS: Mild atherosclerotic disease in the cavernous segment of the internal carotid  artery bilaterally without significant stenosis. Anterior and middle cerebral arteries widely patent without stenosis or aneurysm Both vertebral arteries are patent to the basilar without significant stenosis. There is mild to moderate stenosis in the proximal to mid basilar due to atherosclerotic disease. PICA, superior cerebellar, and posterior cerebral arteries are widely patent without stenosis or aneurysm. IMPRESSION: Mild atherosclerotic disease involving the cavernous carotid bilaterally. Mild moderate stenosis of the basilar. No large vessel occlusion. Electronically Signed   By: Franchot Gallo M.D.   On: 02/18/2015 10:07   Mr Brain Wo Contrast  02/17/2015  CLINICAL DATA:  Generalized weakness, dizziness, and hypotension. Episode of slurred speech. EXAM: MRI HEAD WITHOUT CONTRAST TECHNIQUE: Multiplanar, multiecho pulse sequences of the brain and surrounding structures were obtained without intravenous contrast. COMPARISON:  CT head without contrast from the same day. FINDINGS: The diffusion-weighted images demonstrate no evidence for acute or subacute infarction. Moderate atrophy and severe diffuse periventricular and subcortical white matter disease is present bilaterally. There are remote lacunar infarcts within the basal ganglia bilaterally. A  remote lacunar infarct is present within the right cerebellum. Mild white matter changes extend into the brainstem. The internal auditory canals are within normal limits bilaterally. Flow is present in the major intracranial arteries. Scleral banding is noted on the right. Bilateral lens replacements are present. The globes and orbits are otherwise normal. The paranasal sinuses and mastoid air cells are clear. IMPRESSION: 1. No acute intracranial abnormality. 2. Moderate atrophy and severe diffuse periventricular and subcortical white matter disease bilaterally compatible with chronic microvascular ischemia. 3. Multiple remote lacunar infarcts involving the basal  ganglia bilaterally of the right cerebellum. Electronically Signed   By: San Morelle M.D.   On: 02/17/2015 19:24   Dg Chest Port 1 View  02/24/2015  CLINICAL DATA:  Shortness of breath and hypotension EXAM: PORTABLE CHEST 1 VIEW COMPARISON:  February 21, 2015 FINDINGS: There is patchy airspace opacity in each mid lung. There is generalized interstitial prominence, likely representing edema. There is cardiomegaly. The pulmonary vascularity is within normal limits. Bones are osteoporotic. No adenopathy evident. IMPRESSION: Suspect a degree of congestive heart failure. Patchy airspace opacity in each mid lung region may represent alveolar edema but also could represent superimposed pneumonia. No new opacity is appreciable compared to recent prior study. No change in cardiac silhouette. Electronically Signed   By: Lowella Grip III M.D.   On: 02/24/2015 08:06   Dg Chest Port 1 View  02/20/2015  CLINICAL DATA:  Shortness of breath. EXAM: PORTABLE CHEST 1 VIEW COMPARISON:  February 17, 2015. FINDINGS: Stable cardiomediastinal silhouette. No pneumothorax is noted. Stable lingular and bibasilar opacities are noted concerning for pneumonia. Bony thorax is intact. IMPRESSION: Stable bilateral lung opacities consistent with multifocal pneumonia. Electronically Signed   By: Marijo Conception, M.D.   On: 02/20/2015 12:08    Assessment/Plan 1. Chronic combined systolic and diastolic congestive heart failure ([place]) Had overnight SOB portable CXR 2 views 03/03/2015 showed central pulmonary venous congestion without overt pulmonary edema. Continue on Metoprolol. Will order Furosemide 20 mg Tablet once daily X 5 days then reduce to Furosemide 10 mg Tablet Daily Hold if SBP < 110. Continue daily weight. BNP, BMP,CBC/diff 03/04/2015. Monitor vital signs Q 8 hrs X 5 days. D/c REDS protocol order not a candidate.  2. Diarrhea, unspecified type Continue with imodium. Obtain stool specimen for C-diff  3. Localized  edema Continue with Bilat. [place]. Will order Furosemide 20 mg Tablet once daily X 5 days then reduce to Furosemide 10 mg Tablet Daily Hold if SBP < 110. Continue daily weight.  4.Foley Catheter Afebrile. Reports burning on insertion site. Draining adequate amounts reddish color due to Pyridium. Will order U/A and C/S to r/o UTI.    Family/ staff Communication: Reviewed Plan of care with patient and Nurse supervisor  Labs/tests ordered: BNP, BMP,CBC/diff 03/04/2015. Obtain Stool for C-diff, Urine specimen for U/A and C/S

## 2015-03-04 ENCOUNTER — Encounter (HOSPITAL_COMMUNITY): Payer: Self-pay | Admitting: *Deleted

## 2015-03-04 ENCOUNTER — Inpatient Hospital Stay (HOSPITAL_COMMUNITY)
Admission: EM | Admit: 2015-03-04 | Discharge: 2015-03-17 | DRG: 193 | Disposition: A | Discharge status: Home / Self Care | Payer: Medicare Other | Attending: Internal Medicine | Admitting: Internal Medicine

## 2015-03-04 ENCOUNTER — Emergency Department (HOSPITAL_COMMUNITY): Payer: Medicare Other

## 2015-03-04 DIAGNOSIS — J9601 Acute respiratory failure with hypoxia: Secondary | ICD-10-CM | POA: Diagnosis present

## 2015-03-04 DIAGNOSIS — R140 Abdominal distension (gaseous): Secondary | ICD-10-CM

## 2015-03-04 DIAGNOSIS — E875 Hyperkalemia: Secondary | ICD-10-CM | POA: Diagnosis not present

## 2015-03-04 DIAGNOSIS — J189 Pneumonia, unspecified organism: Secondary | ICD-10-CM | POA: Insufficient documentation

## 2015-03-04 DIAGNOSIS — E860 Dehydration: Secondary | ICD-10-CM | POA: Diagnosis present

## 2015-03-04 DIAGNOSIS — I10 Essential (primary) hypertension: Secondary | ICD-10-CM | POA: Diagnosis present

## 2015-03-04 DIAGNOSIS — Z66 Do not resuscitate: Secondary | ICD-10-CM | POA: Diagnosis present

## 2015-03-04 DIAGNOSIS — Y95 Nosocomial condition: Secondary | ICD-10-CM | POA: Diagnosis present

## 2015-03-04 DIAGNOSIS — Z7189 Other specified counseling: Secondary | ICD-10-CM | POA: Insufficient documentation

## 2015-03-04 DIAGNOSIS — M4806 Spinal stenosis, lumbar region: Secondary | ICD-10-CM | POA: Diagnosis present

## 2015-03-04 DIAGNOSIS — Z7901 Long term (current) use of anticoagulants: Secondary | ICD-10-CM

## 2015-03-04 DIAGNOSIS — Z515 Encounter for palliative care: Secondary | ICD-10-CM | POA: Insufficient documentation

## 2015-03-04 DIAGNOSIS — F329 Major depressive disorder, single episode, unspecified: Secondary | ICD-10-CM | POA: Diagnosis present

## 2015-03-04 DIAGNOSIS — J9621 Acute and chronic respiratory failure with hypoxia: Secondary | ICD-10-CM | POA: Diagnosis present

## 2015-03-04 DIAGNOSIS — N401 Enlarged prostate with lower urinary tract symptoms: Secondary | ICD-10-CM | POA: Diagnosis present

## 2015-03-04 DIAGNOSIS — R0600 Dyspnea, unspecified: Secondary | ICD-10-CM

## 2015-03-04 DIAGNOSIS — E871 Hypo-osmolality and hyponatremia: Secondary | ICD-10-CM | POA: Diagnosis present

## 2015-03-04 DIAGNOSIS — I509 Heart failure, unspecified: Secondary | ICD-10-CM | POA: Diagnosis not present

## 2015-03-04 DIAGNOSIS — N179 Acute kidney failure, unspecified: Secondary | ICD-10-CM | POA: Diagnosis present

## 2015-03-04 DIAGNOSIS — I248 Other forms of acute ischemic heart disease: Secondary | ICD-10-CM | POA: Diagnosis present

## 2015-03-04 DIAGNOSIS — E039 Hypothyroidism, unspecified: Secondary | ICD-10-CM | POA: Diagnosis present

## 2015-03-04 DIAGNOSIS — I2510 Atherosclerotic heart disease of native coronary artery without angina pectoris: Secondary | ICD-10-CM | POA: Diagnosis present

## 2015-03-04 DIAGNOSIS — R778 Other specified abnormalities of plasma proteins: Secondary | ICD-10-CM

## 2015-03-04 DIAGNOSIS — L8990 Pressure ulcer of unspecified site, unspecified stage: Secondary | ICD-10-CM | POA: Diagnosis present

## 2015-03-04 DIAGNOSIS — I482 Chronic atrial fibrillation, unspecified: Secondary | ICD-10-CM | POA: Diagnosis present

## 2015-03-04 DIAGNOSIS — E876 Hypokalemia: Secondary | ICD-10-CM | POA: Diagnosis not present

## 2015-03-04 DIAGNOSIS — N400 Enlarged prostate without lower urinary tract symptoms: Secondary | ICD-10-CM | POA: Diagnosis present

## 2015-03-04 DIAGNOSIS — Z7982 Long term (current) use of aspirin: Secondary | ICD-10-CM

## 2015-03-04 DIAGNOSIS — I5023 Acute on chronic systolic (congestive) heart failure: Secondary | ICD-10-CM | POA: Diagnosis present

## 2015-03-04 DIAGNOSIS — E440 Moderate protein-calorie malnutrition: Secondary | ICD-10-CM | POA: Insufficient documentation

## 2015-03-04 DIAGNOSIS — R338 Other retention of urine: Secondary | ICD-10-CM | POA: Diagnosis present

## 2015-03-04 DIAGNOSIS — I252 Old myocardial infarction: Secondary | ICD-10-CM

## 2015-03-04 DIAGNOSIS — J849 Interstitial pulmonary disease, unspecified: Secondary | ICD-10-CM | POA: Insufficient documentation

## 2015-03-04 DIAGNOSIS — N189 Chronic kidney disease, unspecified: Secondary | ICD-10-CM | POA: Diagnosis present

## 2015-03-04 DIAGNOSIS — R7989 Other specified abnormal findings of blood chemistry: Secondary | ICD-10-CM

## 2015-03-04 DIAGNOSIS — N19 Unspecified kidney failure: Secondary | ICD-10-CM

## 2015-03-04 DIAGNOSIS — E038 Other specified hypothyroidism: Secondary | ICD-10-CM

## 2015-03-04 DIAGNOSIS — J129 Viral pneumonia, unspecified: Principal | ICD-10-CM | POA: Diagnosis present

## 2015-03-04 LAB — BRAIN NATRIURETIC PEPTIDE: B NATRIURETIC PEPTIDE 5: 1506.6 pg/mL — AB (ref 0.0–100.0)

## 2015-03-04 LAB — BASIC METABOLIC PANEL
ANION GAP: 8 (ref 5–15)
BUN: 30 mg/dL — ABNORMAL HIGH (ref 6–20)
CHLORIDE: 107 mmol/L (ref 101–111)
CO2: 19 mmol/L — ABNORMAL LOW (ref 22–32)
CREATININE: 1.79 mg/dL — AB (ref 0.61–1.24)
Calcium: 8 mg/dL — ABNORMAL LOW (ref 8.9–10.3)
GFR calc non Af Amer: 34 mL/min — ABNORMAL LOW (ref >60)
GFR, EST AFRICAN AMERICAN: 40 mL/min — AB (ref >60)
Glucose, Bld: 105 mg/dL — ABNORMAL HIGH (ref 65–99)
Potassium: 3.9 mmol/L (ref 3.5–5.1)
SODIUM: 134 mmol/L — AB (ref 135–145)

## 2015-03-04 LAB — CBC
HCT: 43.1 % (ref 39.0–52.0)
Hemoglobin: 14.3 g/dL (ref 13.0–17.0)
MCH: 32.4 pg (ref 26.0–34.0)
MCHC: 33.2 g/dL (ref 30.0–36.0)
MCV: 97.7 fL (ref 78.0–100.0)
PLATELETS: 250 10*3/uL (ref 150–400)
RBC: 4.41 MIL/uL (ref 4.22–5.81)
RDW: 16.8 % — ABNORMAL HIGH (ref 11.5–15.5)
WBC: 7 10*3/uL (ref 4.0–10.5)

## 2015-03-04 LAB — BLOOD GAS, ARTERIAL
ACID-BASE DEFICIT: 4.3 mmol/L — AB (ref 0.0–2.0)
BICARBONATE: 18.6 meq/L — AB (ref 20.0–24.0)
Drawn by: 235321
FIO2: 1
O2 Saturation: 96.2 %
PH ART: 7.432 (ref 7.350–7.450)
Patient temperature: 98.6
TCO2: 17 mmol/L (ref 0–100)
pCO2 arterial: 28.3 mmHg — ABNORMAL LOW (ref 35.0–45.0)
pO2, Arterial: 115 mmHg — ABNORMAL HIGH (ref 80.0–100.0)

## 2015-03-04 LAB — I-STAT TROPONIN, ED: TROPONIN I, POC: 0.75 ng/mL — AB (ref 0.00–0.08)

## 2015-03-04 MED ORDER — DEXTROSE 5 % IV SOLN: 1.0000 g | Freq: Three times a day (TID) | INTRAVENOUS | Status: DC

## 2015-03-04 MED ORDER — NYSTATIN-TRIAMCINOLONE 100000-0.1 UNIT/GM-% EX CREA
1.0000 "application " | TOPICAL_CREAM | Freq: Three times a day (TID) | CUTANEOUS | Status: DC
  Administered 2015-03-05 – 2015-03-17 (×35): 1 via TOPICAL
  Filled 2015-03-04 (×3): qty 15

## 2015-03-04 MED ORDER — LEVOTHYROXINE SODIUM 25 MCG PO TABS
125.0000 ug | ORAL_TABLET | Freq: Every day | ORAL | Status: DC
  Administered 2015-03-05 – 2015-03-17 (×13): 125 ug via ORAL
  Filled 2015-03-04 (×14): qty 1

## 2015-03-04 MED ORDER — DEXTROSE 5 % IV SOLN
2.0000 g | Freq: Once | INTRAVENOUS | Status: AC
  Administered 2015-03-04: 2 g via INTRAVENOUS
  Filled 2015-03-04: qty 2

## 2015-03-04 MED ORDER — VANCOMYCIN HCL IN DEXTROSE 1-5 GM/200ML-% IV SOLN
1000.0000 mg | INTRAVENOUS | Status: DC
  Administered 2015-03-05 – 2015-03-06 (×2): 1000 mg via INTRAVENOUS
  Filled 2015-03-04 (×4): qty 200

## 2015-03-04 MED ORDER — SODIUM CHLORIDE 0.9 % IV SOLN
250.0000 mL | INTRAVENOUS | Status: DC | PRN
  Administered 2015-03-13: 250 mL via INTRAVENOUS

## 2015-03-04 MED ORDER — METOPROLOL SUCCINATE ER 25 MG PO TB24
25.0000 mg | ORAL_TABLET | Freq: Two times a day (BID) | ORAL | Status: DC
  Administered 2015-03-05 – 2015-03-06 (×5): 25 mg via ORAL
  Filled 2015-03-04 (×6): qty 1

## 2015-03-04 MED ORDER — EZETIMIBE 10 MG PO TABS
10.0000 mg | ORAL_TABLET | Freq: Every morning | ORAL | Status: DC
  Administered 2015-03-05 – 2015-03-12 (×8): 10 mg via ORAL
  Filled 2015-03-04 (×8): qty 1

## 2015-03-04 MED ORDER — ACETAMINOPHEN 325 MG PO TABS
650.0000 mg | ORAL_TABLET | ORAL | Status: DC | PRN
  Administered 2015-03-05: 650 mg via ORAL
  Filled 2015-03-04 (×2): qty 2

## 2015-03-04 MED ORDER — FUROSEMIDE 10 MG/ML IJ SOLN
40.0000 mg | Freq: Once | INTRAMUSCULAR | Status: AC
  Administered 2015-03-04: 40 mg via INTRAVENOUS
  Filled 2015-03-04: qty 4

## 2015-03-04 MED ORDER — TAMSULOSIN HCL 0.4 MG PO CAPS
0.4000 mg | ORAL_CAPSULE | Freq: Every day | ORAL | Status: DC
  Administered 2015-03-05 – 2015-03-12 (×8): 0.4 mg via ORAL
  Filled 2015-03-04 (×8): qty 1

## 2015-03-04 MED ORDER — FINASTERIDE 5 MG PO TABS
5.0000 mg | ORAL_TABLET | Freq: Every day | ORAL | Status: DC
  Administered 2015-03-05 – 2015-03-17 (×13): 5 mg via ORAL
  Filled 2015-03-04 (×14): qty 1

## 2015-03-04 MED ORDER — SERTRALINE HCL 50 MG PO TABS
50.0000 mg | ORAL_TABLET | Freq: Every day | ORAL | Status: DC
  Administered 2015-03-05 – 2015-03-17 (×13): 50 mg via ORAL
  Filled 2015-03-04 (×14): qty 1

## 2015-03-04 MED ORDER — MIDODRINE HCL 5 MG PO TABS
10.0000 mg | ORAL_TABLET | Freq: Three times a day (TID) | ORAL | Status: DC
  Administered 2015-03-05 – 2015-03-16 (×35): 10 mg via ORAL
  Filled 2015-03-04 (×47): qty 2

## 2015-03-04 MED ORDER — VANCOMYCIN HCL IN DEXTROSE 1-5 GM/200ML-% IV SOLN: 1000.0000 mg | INTRAVENOUS | Status: DC

## 2015-03-04 MED ORDER — ASPIRIN EC 81 MG PO TBEC
81.0000 mg | DELAYED_RELEASE_TABLET | Freq: Every day | ORAL | Status: DC
  Administered 2015-03-05 – 2015-03-16 (×12): 81 mg via ORAL
  Filled 2015-03-04 (×13): qty 1

## 2015-03-04 MED ORDER — FUROSEMIDE 10 MG/ML IJ SOLN
40.0000 mg | Freq: Once | INTRAMUSCULAR | Status: AC
  Administered 2015-03-05: 40 mg via INTRAVENOUS
  Filled 2015-03-04: qty 4

## 2015-03-04 MED ORDER — SACCHAROMYCES BOULARDII 250 MG PO CAPS
250.0000 mg | ORAL_CAPSULE | Freq: Every morning | ORAL | Status: DC
  Administered 2015-03-05 – 2015-03-17 (×13): 250 mg via ORAL
  Filled 2015-03-04 (×14): qty 1

## 2015-03-04 MED ORDER — SODIUM CHLORIDE 0.9% FLUSH: 3.0000 mL | INTRAVENOUS | Status: DC | PRN

## 2015-03-04 MED ORDER — DEXTROSE 5 % IV SOLN: 2.0000 g | INTRAVENOUS | Status: DC

## 2015-03-04 MED ORDER — ONDANSETRON HCL 4 MG/2ML IJ SOLN
4.0000 mg | Freq: Four times a day (QID) | INTRAMUSCULAR | Status: DC | PRN
  Administered 2015-03-16: 4 mg via INTRAVENOUS
  Filled 2015-03-04: qty 2

## 2015-03-04 MED ORDER — VANCOMYCIN HCL IN DEXTROSE 1-5 GM/200ML-% IV SOLN
1000.0000 mg | Freq: Once | INTRAVENOUS | Status: AC
  Administered 2015-03-04: 1000 mg via INTRAVENOUS
  Filled 2015-03-04: qty 200

## 2015-03-04 MED ORDER — LOPERAMIDE HCL 2 MG PO CAPS: 2.0000 mg | ORAL_CAPSULE | Freq: Four times a day (QID) | ORAL | Status: DC | PRN

## 2015-03-04 MED ORDER — PHENAZOPYRIDINE HCL 200 MG PO TABS
200.0000 mg | ORAL_TABLET | Freq: Three times a day (TID) | ORAL | Status: DC
  Administered 2015-03-05 – 2015-03-06 (×4): 200 mg via ORAL
  Filled 2015-03-04 (×6): qty 1

## 2015-03-04 MED ORDER — SODIUM CHLORIDE 0.9% FLUSH
3.0000 mL | Freq: Two times a day (BID) | INTRAVENOUS | Status: DC
  Administered 2015-03-05 – 2015-03-16 (×10): 3 mL via INTRAVENOUS

## 2015-03-04 MED ORDER — RIVAROXABAN 15 MG PO TABS
15.0000 mg | ORAL_TABLET | Freq: Every day | ORAL | Status: DC
  Administered 2015-03-05 – 2015-03-16 (×12): 15 mg via ORAL
  Filled 2015-03-04 (×14): qty 1

## 2015-03-04 MED ORDER — DEXTROSE 5 % IV SOLN
2.0000 g | INTRAVENOUS | Status: DC
  Administered 2015-03-05 – 2015-03-06 (×2): 2 g via INTRAVENOUS
  Filled 2015-03-04 (×2): qty 2

## 2015-03-04 MED ORDER — POTASSIUM CHLORIDE CRYS ER 20 MEQ PO TBCR
20.0000 meq | EXTENDED_RELEASE_TABLET | Freq: Two times a day (BID) | ORAL | Status: DC
  Administered 2015-03-05 – 2015-03-08 (×9): 20 meq via ORAL
  Filled 2015-03-04 (×9): qty 1

## 2015-03-04 NOTE — ED Notes (Signed)
MD at bedside. 

## 2015-03-04 NOTE — Progress Notes (Signed)
Patient recently admitted from hospital from 02/01 to 02/09 with dx of slurred speech, UTI and CAP.  Patient discharged to [facility].  Patient returning to ED with shortness of breath.  Patient wearing 10 liters of oxygen in the ED.  Patient to be admitted with CHF.  Pcp listed as Dr. Smith Robert.

## 2015-03-04 NOTE — ED Notes (Signed)
Bed: WA21 Expected date:  Expected time:  Means of arrival:  Comments: [facility] from facility

## 2015-03-04 NOTE — ED Notes (Signed)
Tomi Bamberger, EDP made aware of patient i-stat troponin result.

## 2015-03-04 NOTE — ED Provider Notes (Signed)
CSN: [id number]     Arrival date & time 03/04/15  1832 History   First MD Initiated Contact with Patient 03/04/15 1834     Chief Complaint  Patient presents with  . Shortness of Breath    HPI The patient was recently discharged from [facility] on February 9 for admission of community-acquired pneumonia, congestive heart failure, urinary tract infection and confusion. The patient has been recovering in a nursing home. He was sent into the emergency room today because of persistent hypoxia. Patient was discharged on 4 L of nasal cannula oxygen but has had difficulty maintaining his saturation above 90 today. She himself denies any particular trouble with shortness of breath. He denies any chest pain. No fevers. No abdominal pain. The only discomfort he is having is with the urinary catheter was placed when he was hospitalized Past Medical History  Diagnosis Date  . Dyslipidemia   . HTN (hypertension)   . Obesity   . Increased liver enzymes     felt to be a fatty liver per ultrasound in 2011  . Dyspnea   . Ischemic cardiomyopathy   . Spinal stenosis   . Sleep apnea     bipap   . CAD (coronary artery disease)     Silent MI sometime prior to 2000. S/P cath in 2000 showing a total mid RCA with collaterals from the LCX. EF is 40 to50%; Last Myoview in 2010 showing inferior scar without ischemia. EF was 52%.  Marland Kitchen Dysrhythmia     atrial fib   . Edema     lower extremity   . Cancer ([place])     skin cancer   . Anemia   . Diverticular disease dec 2015  . Umbilical hernia   . Myocardial infarction ([place])   . CHF (congestive heart failure) [facility])    Past Surgical History  Procedure Laterality Date  . Total hip arthroplasty    . Eye surgery    . Lower back surgery      spinal stenosis  . Shoulder surgery    . Cardioversion N/A 06/06/2012    Procedure: CARDIOVERSION;  Surgeon: Thayer Headings, MD;  Location: [facility];  Service: Cardiovascular;  Laterality: N/A;  . Cardiac  catheterization    . Partial colectomy N/A 06/12/2014    Procedure: SIGMOID COLECTOMY AND TAKEDOWN OF COLOVESICAL FISTULA ;  Surgeon: Armandina Gemma, MD;  Location: [facility];  Service: General;  Laterality: N/A;  . Umbilical hernia repair N/A 06/12/2014    Procedure: INCARCERATED UMBILICAL HERNIA REPAIR  ;  Surgeon: Armandina Gemma, MD;  Location: [facility];  Service: General;  Laterality: N/A;  . Cystostomy N/A 06/12/2014    Procedure: Jasmine December;  Surgeon: Franchot Gallo, MD;  Location: [facility];  Service: Urology;  Laterality: N/A;   Family History  Problem Relation Age of Onset  . Stroke Father   . Heart attack Father   . Hypertension Father   . Diabetes Father   . Drug abuse Mother   . Hypertension Brother   . Hypertension Brother   . Hypertension Brother   . Hypertension Sister    Social History  Substance Use Topics  . Smoking status: Never Smoker   . Smokeless tobacco: Never Used  . Alcohol Use: Yes     Comment: used to take 2 glasses of wine daily, but has not for a while     Review of Systems  All other systems reviewed and are negative.     Allergies  Oxybutynin and Latex  Home Medications   Prior to Admission medications   Medication Sig Start Date End Date Taking? Authorizing Provider  aspirin EC 81 MG EC tablet Take 1 tablet (81 mg total) by mouth daily. 02/25/15  Yes Thurnell Lose, MD  ezetimibe (ZETIA) 10 MG tablet Take 10 mg by mouth every morning.    Yes Historical Provider, MD  finasteride (PROSCAR) 5 MG tablet Take 5 mg by mouth daily.   Yes Historical Provider, MD  levothyroxine (SYNTHROID, LEVOTHROID) 125 MCG tablet Take 1 tablet (125 mcg total) by mouth daily before breakfast. 02/25/15  Yes Thurnell Lose, MD  loperamide (IMODIUM) 1 MG/5ML solution Take 10 mLs (2 mg total) by mouth every 6 (six) hours as needed for diarrhea or loose stools. 01/18/14  Yes Robbie Lis, MD  metoprolol succinate (TOPROL-XL) 25 MG 24 hr tablet Take 25 mg by mouth 2 (two) times  daily.   Yes Historical Provider, MD  midodrine (PROAMATINE) 10 MG tablet Take 1 tablet (10 mg total) by mouth 3 (three) times daily with meals. 02/25/15  Yes Thurnell Lose, MD  nystatin-triamcinolone (MYCOLOG II) cream Apply 1 application topically 3 (three) times daily. Apply to penis scrotum buttock and thighs every shift for rash   Yes Historical Provider, MD  phenazopyridine (PYRIDIUM) 200 MG tablet Take 200 mg by mouth 3 (three) times daily with meals.   Yes Historical Provider, MD  saccharomyces boulardii (FLORASTOR) 250 MG capsule Take 250 mg by mouth every morning.    Yes Historical Provider, MD  sertraline (ZOLOFT) 50 MG tablet Take 50 mg by mouth daily.   Yes Historical Provider, MD  silodosin (RAPAFLO) 8 MG CAPS capsule Take 8 mg by mouth daily with breakfast.   Yes Historical Provider, MD  XARELTO 15 MG TABS tablet TAKE 1 TABLET (15 MG TOTAL) BY MOUTH DAILY WITH SUPPER. 02/08/15  Yes Sherren Mocha, MD  cefpodoxime (VANTIN) 200 MG tablet Take 1 tablet (200 mg total) by mouth 2 (two) times daily. Stop date 03/03/2015 Patient not taking: Reported on 03/04/2015 02/25/15   Thurnell Lose, MD  furosemide (LASIX) 20 MG tablet Take 10 mg by mouth daily. HOLD if SBP<110    Historical Provider, MD  metoprolol succinate (TOPROL-XL) 50 MG 24 hr tablet TAKE 1 TABLET BY MOUTH EVERY MORNING & 1/2 TABLET EVERY EVENING WITH OR IMMEDIATELY FOLLOWING A MEAL Patient not taking: Reported on 03/04/2015 09/28/14   Sherren Mocha, MD   BP 126/68 mmHg  Pulse 96  Temp(Src) 98.2 F (36.8 C) (Oral)  Resp 25  SpO2 94% Physical Exam  Constitutional: No distress.  HENT:  Head: Normocephalic and atraumatic.  Right Ear: External ear normal.  Left Ear: External ear normal.  Eyes: Conjunctivae are normal. Right eye exhibits no discharge. Left eye exhibits no discharge. No scleral icterus.  Neck: Neck supple. No tracheal deviation present.  Cardiovascular: Normal rate, regular rhythm and intact distal pulses.    Pulmonary/Chest: Effort normal and breath sounds normal. No stridor. No respiratory distress. He has no wheezes. He has no rales.  Abdominal: Soft. Bowel sounds are normal. He exhibits no distension. There is no tenderness. There is no rebound and no guarding.  Musculoskeletal: He exhibits edema. He exhibits no tenderness.  Neurological: He is alert. No cranial nerve deficit (no facial droop, extraocular movements intact, no slurred speech) or sensory deficit. He exhibits normal muscle tone. He displays no seizure activity. Coordination normal.  Generalized weakness  Skin: Skin is warm  and dry. No rash noted. He is not diaphoretic.  Psychiatric: He has a normal mood and affect.  Nursing note and vitals reviewed.   ED Course  Procedures (including critical care time) Labs Review Labs Reviewed  BASIC METABOLIC PANEL - Abnormal; Notable for the following:    Sodium 134 (*)    CO2 19 (*)    Glucose, Bld 105 (*)    BUN 30 (*)    Creatinine, Ser 1.79 (*)    Calcium 8.0 (*)    GFR calc non Af Amer 34 (*)    GFR calc Af Amer 40 (*)    All other components within normal limits  CBC - Abnormal; Notable for the following:    RDW 16.8 (*)    All other components within normal limits  BRAIN NATRIURETIC PEPTIDE - Abnormal; Notable for the following:    B Natriuretic Peptide 1506.6 (*)    All other components within normal limits  BLOOD GAS, ARTERIAL - Abnormal; Notable for the following:    pCO2 arterial 28.3 (*)    pO2, Arterial 115 (*)    Bicarbonate 18.6 (*)    Acid-base deficit 4.3 (*)    All other components within normal limits  I-STAT TROPOININ, ED - Abnormal; Notable for the following:    Troponin i, poc 0.75 (*)    All other components within normal limits    Imaging Review Dg Chest 2 View  03/04/2015  CLINICAL DATA:  Shortness of breath EXAM: CHEST  2 VIEW COMPARISON:  02/24/2015 FINDINGS: Low volumes with coarse interstitial opacities which has been seen on the most recent  studies, with patchy bilateral lung opacity. Suspect a degree of interstitial lung disease, which has progressed/ developed since early 2016. Interstitial opacities were present at the lung bases April 22, 2014 by abdominal CT. Chronic cardiopericardial enlargement. No definitive pleural effusion. No asymmetric opacity IMPRESSION: 1. Diffuse lung opacity with patchy appearance favoring pneumonia over edema. 2. Suspect underlying interstitial lung disease. Electronically Signed   By: Monte Fantasia M.D.   On: 03/04/2015 19:45   I have personally reviewed and evaluated these images and lab results as part of my medical decision-making.   EKG Interpretation   Date/Time:  Thursday March 04 2015 18:46:04 EST Ventricular Rate:  93 PR Interval:    QRS Duration: 160 QT Interval:  405 QTC Calculation: 504 R Axis:   19 Text Interpretation:  Atrial fibrillation Ventricular premature complex  Right bundle branch block Borderline ST depression, lateral leads Since  last tracing rate faster otherwise no significant change Confirmed by  Adalynn Corne  MD-J, Charina Fons [id number]) on 03/04/2015 7:04:50 PM      MDM   Final diagnoses:  HCAP (healthcare-associated pneumonia)  Acute on chronic congestive heart failure, unspecified congestive heart failure type (HCC)  Elevated troponin    Patient was recently hospital for community acquired pneumonia just heart failure. Patient presented back to the emergency room for decreased oxygen saturation.  CXR shows worsening infiltrates.  Pt is not febrile and does not have an elevated WBC.  It is possible this is worsening HCAP.  Could be a component of CHF as well.  Will start Abx and treat for CHF.  Consult with medical service.   Dorie Rank, MD 03/04/15 2117

## 2015-03-04 NOTE — Progress Notes (Signed)
Pharmacy Antibiotic Note  Harold Jennings is a 82 y.o. male admitted on 03/04/2015 with pneumonia.  Pharmacy has been consulted for vancomycin and cefepime dosing.  Plan: Vancomycin 1g IV every 24 hours.  Goal trough 15-20 mcg/mL. The dose of cefepime will be adjusted to 2g IV q24 based on renal function.     Temp (24hrs), Avg:98.2 F (36.8 C), Min:98.2 F (36.8 C), Max:98.2 F (36.8 C)   Recent Labs Lab 03/04/15 2007  WBC 7.0  CREATININE 1.79*    Estimated Creatinine Clearance: 31.8 mL/min (by C-G formula based on Cr of 1.79).    Allergies  Allergen Reactions  . Oxybutynin Anaphylaxis  . Latex Itching and Rash    Antimicrobials this admission: 2/16 Cefepime >>  2/16 Vanc >>  Dose adjustments this admission: n/a  Microbiology results: pending  Thank you for allowing pharmacy to be a part of this patient's care.   Adrian Saran, PharmD, BCPS Pager [phone] 03/04/2015 8:49 PM

## 2015-03-04 NOTE — ED Notes (Signed)
Patient in xray 

## 2015-03-04 NOTE — H&P (Signed)
[facility] History and Physical  Harold Jennings [id number] DOB: [birth year] DOA: 03/04/2015  Referring physician: ED physician PCP: London Pepper, MD  Specialists: Dr. Burt Knack (cardiology), Dr. Elsworth Soho (pulmonology)   Chief Complaint:  O2 desaturation despite 4 Lpm supplemental O2 at SNF   HPI: Harold Jennings is a 82 y.o. male with PMH of chronic atrial fibrillation on Xarelto, chronic systolic CHF (EF [id number]), hypertension, and lumbar spinal stenosis who presents from his SNF for evaluation of acute hypoxic respiratory failure. Patient was admitted to [facility] from 02/17/2015 - 02/25/2015, during which time he was aggressively diuresed and treated empirically for CAP with azithromycin and Rocephin. Patient made good progress during the hospitalization, but had persistent O2 requirement and was discharged to SNF on 4 L/m nasal cannula. Today, patient was noted to have saturations in the low 80s on 4 L/m at the SNF and EMS was activated for transport to the hospital. Patient himself reports fatigue and general malaise, but denies chest pain, palpitations, dyspnea, or cough. He denies fever or chills. He has not had rhinorrhea, sore throat, nausea, or vomiting. He reported good improvement during the last hospitalization, but states that shortly after discharge he began to develop worsening fatigue and a nonspecific malaise.  In ED, patient was found to be afebrile, saturating in the 80s on 4 L/m supplemental oxygen, and with remaining vital signs stable. A chest x-ray demonstrated diffuse bilateral patchy opacities concerning for pneumonia, with edema less likely. EKG featured atrial fibrillation with right bundle branch block and borderline ST depression in leads V4 to V6. Chem panel is notable for a mild hyponatremia, serum bicarbonate of 19, and serum creatinine of 1.79 which is up from his baseline of 1, but improving from time of recent discharge. BNP returned elevated to 1500 and troponin is  also elevated to 0.75. CBC is unremarkable. In the emergency department, the patient was given a 40 mg IV push of Lasix and treated with empiric vancomycin and cefepime. His O2 sat duration and improved to the 90s on oxi-mask, he remained hemodynamically stable, and will be admitted for ongoing evaluation and management of acute hypoxic respiratory failure suspected secondary to acute on chronic systolic CHF and HCAP.  Where does patient live?   SNF      Can patient participate in ADLs? Some   Review of Systems:   General: no fevers, chills, sweats, weight change, or poor appetite.  Fatigue, malaise HEENT: no blurry vision, hearing changes or sore throat Pulm: no dyspnea, cough, or wheeze CV: no chest pain or palpitations Abd: no nausea, vomiting, abdominal pain, or constipation. Chronic diarrhea GU: no dysuria, hematuria, increased urinary frequency, or urgency. Pruritic groin rash   Ext: no leg edema Neuro: no focal weakness, numbness, or tingling, no vision change or hearing loss Skin: Groin rash, no wounds MSK: No muscle spasm, no deformity, no red, hot, or swollen joint Heme: Easy bruising and bleeding Travel history: No recent long distant travel    Allergy:  Allergies  Allergen Reactions  . Oxybutynin Anaphylaxis  . Latex Itching and Rash    Past Medical History  Diagnosis Date  . Dyslipidemia   . HTN (hypertension)   . Obesity   . Increased liver enzymes     felt to be a fatty liver per ultrasound in 2011  . Dyspnea   . Ischemic cardiomyopathy   . Spinal stenosis   . Sleep apnea     bipap   . CAD (coronary artery disease)  Silent MI sometime prior to 2000. S/P cath in 2000 showing a total mid RCA with collaterals from the LCX. EF is 40 to50%; Last Myoview in 2010 showing inferior scar without ischemia. EF was 52%.  Marland Kitchen Dysrhythmia     atrial fib   . Edema     lower extremity   . Cancer ([place])     skin cancer   . Anemia   . Diverticular disease dec 2015  .  Umbilical hernia   . Myocardial infarction ([place])   . CHF (congestive heart failure) [facility])     Past Surgical History  Procedure Laterality Date  . Total hip arthroplasty    . Eye surgery    . Lower back surgery      spinal stenosis  . Shoulder surgery    . Cardioversion N/A 06/06/2012    Procedure: CARDIOVERSION;  Surgeon: Thayer Headings, MD;  Location: [facility];  Service: Cardiovascular;  Laterality: N/A;  . Cardiac catheterization    . Partial colectomy N/A 06/12/2014    Procedure: SIGMOID COLECTOMY AND TAKEDOWN OF COLOVESICAL FISTULA ;  Surgeon: Armandina Gemma, MD;  [facility];  Service: General;  Laterality: N/A;  . Umbilical hernia repair N/A 06/12/2014    Procedure: INCARCERATED UMBILICAL HERNIA REPAIR  ;  Surgeon: Armandina Gemma, MD;  [facility];  Service: General;  Laterality: N/A;  . Cystostomy N/A 06/12/2014    Procedure: Jasmine December;  Surgeon: Franchot Gallo, MD;  [facility];  Service: Urology;  Laterality: N/A;    Social History:  reports that he has never smoked. He has never used smokeless tobacco. He reports that he drinks alcohol. He reports that he does not use illicit drugs.  Family History:  Family History  Problem Relation Age of Onset  . Stroke Father   . Heart attack Father   . Hypertension Father   . Diabetes Father   . Drug abuse Mother   . Hypertension Brother   . Hypertension Brother   . Hypertension Brother   . Hypertension Sister      Prior to Admission medications   Medication Sig Start Date End Date Taking? Authorizing Provider  aspirin EC 81 MG EC tablet Take 1 tablet (81 mg total) by mouth daily. 02/25/15  Yes Thurnell Lose, MD  ezetimibe (ZETIA) 10 MG tablet Take 10 mg by mouth every morning.    Yes Historical Provider, MD  finasteride (PROSCAR) 5 MG tablet Take 5 mg by mouth daily.   Yes Historical Provider, MD  levothyroxine (SYNTHROID, LEVOTHROID) 125 MCG tablet Take 1 tablet (125 mcg total) by mouth daily before  breakfast. 02/25/15  Yes Thurnell Lose, MD  loperamide (IMODIUM) 1 MG/5ML solution Take 10 mLs (2 mg total) by mouth every 6 (six) hours as needed for diarrhea or loose stools. 01/18/14  Yes Robbie Lis, MD  metoprolol succinate (TOPROL-XL) 25 MG 24 hr tablet Take 25 mg by mouth 2 (two) times daily.   Yes Historical Provider, MD  midodrine (PROAMATINE) 10 MG tablet Take 1 tablet (10 mg total) by mouth 3 (three) times daily with meals. 02/25/15  Yes Thurnell Lose, MD  nystatin-triamcinolone (MYCOLOG II) cream Apply 1 application topically 3 (three) times daily. Apply to penis scrotum buttock and thighs every shift for rash   Yes Historical Provider, MD  phenazopyridine (PYRIDIUM) 200 MG tablet Take 200 mg by mouth 3 (three) times daily with meals.   Yes Historical Provider, MD  saccharomyces boulardii (FLORASTOR) 250 MG  capsule Take 250 mg by mouth every morning.    Yes Historical Provider, MD  sertraline (ZOLOFT) 50 MG tablet Take 50 mg by mouth daily.   Yes Historical Provider, MD  silodosin (RAPAFLO) 8 MG CAPS capsule Take 8 mg by mouth daily with breakfast.   Yes Historical Provider, MD  XARELTO 15 MG TABS tablet TAKE 1 TABLET (15 MG TOTAL) BY MOUTH DAILY WITH SUPPER. 02/08/15  Yes Sherren Mocha, MD  cefpodoxime (VANTIN) 200 MG tablet Take 1 tablet (200 mg total) by mouth 2 (two) times daily. Stop date 03/03/2015 Patient not taking: Reported on 03/04/2015 02/25/15   Thurnell Lose, MD  furosemide (LASIX) 20 MG tablet Take 10 mg by mouth daily. HOLD if SBP<110    Historical Provider, MD  metoprolol succinate (TOPROL-XL) 50 MG 24 hr tablet TAKE 1 TABLET BY MOUTH EVERY MORNING & 1/2 TABLET EVERY EVENING WITH OR IMMEDIATELY FOLLOWING A MEAL Patient not taking: Reported on 03/04/2015 09/28/14   Sherren Mocha, MD    Physical Exam: Filed Vitals:   03/04/15 2125 03/04/15 2145 03/04/15 2200 03/04/15 2211  BP: 127/66   145/65  Pulse: 72 90 86 93  Temp:      TempSrc:      Resp: 20 22 23 19   SpO2:  95% 95% 94% 94%   General: In acute distress with pallor and tachypnea on NRB  HEENT:       Eyes: PERRL, EOMI, no scleral icterus or conjunctival pallor.       ENT: No discharge from the ears or nose, no pharyngeal ulcers.        Neck: No JVD, no bruit, no appreciable mass Heme: No cervical adenopathy, no pallor Cardiac: Rate ~60 and irregular, soft mid-systolic murmur, No gallops or rubs. Pulm: Good air movement bilaterally. Coarse ronchi throughout b/l lung fields, tachypnea, on NRB at 10 Lpm. Abd: Soft, nondistended, nontender, no rebound pain or gaurding, no mass or organomegaly, BS present. Ext: No LE edema bilaterally. 2+DP/PT pulse bilaterally. Musculoskeletal: No gross deformity, no red, hot, swollen joints  Skin: No wounds on exposed surfaces, erythematous rash in groin with skin breakdown  Neuro: Alert, oriented X3, cranial nerves II-XII grossly intact. No focal findings Psych: Patient is not overtly psychotic, appropriate mood and affect.  Labs on Admission:  Basic Metabolic Panel:  Recent Labs Lab 03/04/15 2007  NA 134*  K 3.9  CL 107  CO2 19*  GLUCOSE 105*  BUN 30*  CREATININE 1.79*  CALCIUM 8.0*   Liver Function Tests: No results for input(s): AST, ALT, ALKPHOS, BILITOT, PROT, ALBUMIN in the last 168 hours. No results for input(s): LIPASE, AMYLASE in the last 168 hours. No results for input(s): AMMONIA in the last 168 hours. CBC:  Recent Labs Lab 03/04/15 2007  WBC 7.0  HGB 14.3  HCT 43.1  MCV 97.7  PLT 250   Cardiac Enzymes: No results for input(s): CKTOTAL, CKMB, CKMBINDEX, TROPONINI in the last 168 hours.  BNP (last 3 results)  Recent Labs  02/17/15 1553 03/04/15 2007  BNP 820.8* 1506.6*    ProBNP (last 3 results) No results for input(s): PROBNP in the last 8760 hours.  CBG: No results for input(s): GLUCAP in the last 168 hours.  Radiological Exams on Admission: Dg Chest 2 View  03/04/2015  CLINICAL DATA:  Shortness of breath EXAM:  CHEST  2 VIEW COMPARISON:  02/24/2015 FINDINGS: Low volumes with coarse interstitial opacities which has been seen on the most recent studies, with patchy bilateral  lung opacity. Suspect a degree of interstitial lung disease, which has progressed/ developed since early 2016. Interstitial opacities were present at the lung bases April 22, 2014 by abdominal CT. Chronic cardiopericardial enlargement. No definitive pleural effusion. No asymmetric opacity IMPRESSION: 1. Diffuse lung opacity with patchy appearance favoring pneumonia over edema. 2. Suspect underlying interstitial lung disease. Electronically Signed   By: Monte Fantasia M.D.   On: 03/04/2015 19:45    EKG: Independently reviewed.  Abnormal findings: Atrial fibrillation, RBBB, ST-depression V4-V6    Assessment/Plan  1. Acute hypoxic respiratory failure  - Multifactorial with contributions from acute on chronic systolic CHF and HCAP - Discharged 1 wk ago on 4 Lpm, but sat persisting <90% at rest at SNF, improved with 10 Lpm via NRB - Management as below    2. Acute on chronic systolic CHF  - TTE (123456) EF 40-45%, mild LVH, severe LA dilation, mild-moderately elevated PA pressures  - BNP 1500 on arrival, was 820 on 02/17/15  - Diuresing with IV Lasix, following strict I/Os, daily wts, SLIV, fluid-restrict diet  - Midodrine 10 mg TID for BP support during diuresis  - Continue ASA 81, Zetia, resume beta-blocker once compensated; has been intolerant to statins and ACE/ARB precluded by CKD and propensity for dehydration and AKI per cardiology notes   3. HCAP  - Afeb, no leukocytosis, but CXR findings c/w PNA  - Will cover with cefepime and vancomycin per HCAP guidelines  - MRSA screen has been positive in past  - Will check procalcitonin; if low, would consider repeating CXR after diuresis as infiltrates on CXR may represent edema  - Trend lactate, check urine for strep pneumo antigens, send sputum for GS and cx   4. Acute kidney injury   - SCr 1.8 on arrival, up from an apparent baseline of 1, but improving from time of discharge 1wk ago  - Anticipate may worsen some initially following diuresis, but goal is to him back on Frank-Starling curve which should theoretically improve renal perfusion  - Avoid nephrotoxins where feasible  - Trend    5. Chronic atrial fibrillation  - LA severely dilated on TTE, suggesting that this will be permanent  - Continue AC with Xarelto  - Rate-controlled effectively with Toprol XL, but will need to exercise caution with beta-blockers in acute HF   6. Hyponatremia  - Likely secondary to CHF and the associated relative arterial blood volume depletion (and subsequent increase in ADH production)  - Anticipate some improvement with management of volume status-  - Repeat chemistries in am   7. Hypothyroidism - Continue current home-dose Synthroid  - Thyroid studies had been recommended in early March at time of recent discharge   8. BPH  - Continue Flomax, finasteride  - Foley in place currently, will d/c as able    9. Elevated cardiac biomarker - Troponin I elevated to 0.75 on admission  - Likely secondary to acute CHF; pt denies CP, palpitations, nausea - Will trend, consult cards if spikes or CP develops  - Repeat EKG in am    DVT ppx:  Continue Xarelto  Code Status: DNR Family Communication: None at bed side.        Disposition Plan: Admit to inpatient   Date of Service 03/04/2015    Vianne Bulls, MD [facility] Pager [phone]  If 7PM-7AM, please contact night-coverage [email or web address] Password [facility] 03/04/2015, 10:57 PM

## 2015-03-04 NOTE — ED Notes (Addendum)
Pt is from [facility] for rehab for PNA.  Pt sats have dropped today into the 80s on 4L today.  They put him on 5L today and then up 10 L with simple mask.  At present pt is sating 87 on 4L.  Pt denies any SOB at present.  Denies hx of COPD. Pt alert and oriented at present.  Has rash to groin area from latex condom cath placed at [facility] about a week ago.  Pt allergic to latex.

## 2015-03-04 NOTE — ED Notes (Signed)
Previous shift nurse called respiratory in regards to ABG. ABG completion clicked off by previous RN in error.

## 2015-03-04 NOTE — ED Notes (Signed)
Nurse stated to not get the pts blood because she was about to take him to his room on the floor.

## 2015-03-04 NOTE — ED Notes (Signed)
Patient's lactic acid blood drawn before patient taken upstairs

## 2015-03-05 DIAGNOSIS — R7989 Other specified abnormal findings of blood chemistry: Secondary | ICD-10-CM | POA: Insufficient documentation

## 2015-03-05 DIAGNOSIS — E440 Moderate protein-calorie malnutrition: Secondary | ICD-10-CM | POA: Insufficient documentation

## 2015-03-05 DIAGNOSIS — R778 Other specified abnormalities of plasma proteins: Secondary | ICD-10-CM | POA: Insufficient documentation

## 2015-03-05 DIAGNOSIS — L8990 Pressure ulcer of unspecified site, unspecified stage: Secondary | ICD-10-CM | POA: Insufficient documentation

## 2015-03-05 LAB — BASIC METABOLIC PANEL
Anion gap: 9 (ref 5–15)
BUN: 29 mg/dL — AB (ref 6–20)
CALCIUM: 8.1 mg/dL — AB (ref 8.9–10.3)
CHLORIDE: 105 mmol/L (ref 101–111)
CO2: 22 mmol/L (ref 22–32)
CREATININE: 1.69 mg/dL — AB (ref 0.61–1.24)
GFR calc non Af Amer: 37 mL/min — ABNORMAL LOW (ref >60)
GFR, EST AFRICAN AMERICAN: 42 mL/min — AB (ref >60)
GLUCOSE: 116 mg/dL — AB (ref 65–99)
Potassium: 3.9 mmol/L (ref 3.5–5.1)
Sodium: 136 mmol/L (ref 135–145)

## 2015-03-05 LAB — CBC WITH DIFFERENTIAL/PLATELET
BASOS ABS: 0 10*3/uL (ref 0.0–0.1)
Basophils Relative: 0 %
EOS PCT: 4 %
Eosinophils Absolute: 0.4 10*3/uL (ref 0.0–0.7)
HEMATOCRIT: 29.8 % — AB (ref 39.0–52.0)
Hemoglobin: 9.8 g/dL — ABNORMAL LOW (ref 13.0–17.0)
LYMPHS ABS: 1.8 10*3/uL (ref 0.7–4.0)
LYMPHS PCT: 16 %
MCH: 33 pg (ref 26.0–34.0)
MCHC: 32.9 g/dL (ref 30.0–36.0)
MCV: 100.3 fL — AB (ref 78.0–100.0)
MONO ABS: 1.1 10*3/uL — AB (ref 0.1–1.0)
MONOS PCT: 10 %
NEUTROS ABS: 7.9 10*3/uL — AB (ref 1.7–7.7)
Neutrophils Relative %: 70 %
PLATELETS: 376 10*3/uL (ref 150–400)
RBC: 2.97 MIL/uL — ABNORMAL LOW (ref 4.22–5.81)
RDW: 17.1 % — AB (ref 11.5–15.5)
WBC: 11.3 10*3/uL — ABNORMAL HIGH (ref 4.0–10.5)

## 2015-03-05 LAB — STREP PNEUMONIAE URINARY ANTIGEN: Strep Pneumo Urinary Antigen: NEGATIVE

## 2015-03-05 LAB — TROPONIN I: Troponin I: 0.83 ng/mL (ref <0.031)

## 2015-03-05 LAB — LACTIC ACID, PLASMA
Lactic Acid, Venous: 1.1 mmol/L (ref 0.5–2.0)
Lactic Acid, Venous: 1.2 mmol/L (ref 0.5–2.0)

## 2015-03-05 LAB — MRSA PCR SCREENING: MRSA BY PCR: NEGATIVE

## 2015-03-05 LAB — BRAIN NATRIURETIC PEPTIDE: B NATRIURETIC PEPTIDE 5: 1236.3 pg/mL — AB (ref 0.0–100.0)

## 2015-03-05 LAB — PROCALCITONIN: PROCALCITONIN: 0.19 ng/mL

## 2015-03-05 MED ORDER — FUROSEMIDE 10 MG/ML IJ SOLN
40.0000 mg | Freq: Every day | INTRAMUSCULAR | Status: DC
  Administered 2015-03-05 – 2015-03-06 (×2): 40 mg via INTRAVENOUS
  Filled 2015-03-05 (×3): qty 4

## 2015-03-05 MED ORDER — LIP MEDEX EX OINT
TOPICAL_OINTMENT | CUTANEOUS | Status: DC | PRN
  Administered 2015-03-05: 23:00:00 via TOPICAL
  Filled 2015-03-05 (×2): qty 7

## 2015-03-05 MED ORDER — ENSURE ENLIVE PO LIQD
237.0000 mL | Freq: Two times a day (BID) | ORAL | Status: DC
  Administered 2015-03-05 – 2015-03-17 (×16): 237 mL via ORAL

## 2015-03-05 NOTE — Care Management Note (Signed)
Case Management Note  Patient Details  Name: Harold Jennings MRN: [id number] Date of Birth: Jan 29, 1932  Subjective/Objective: 82 y/o m admitted w/Acute resp failure. Hx:DNR.From SNF-[facility].PT cons-await recc. CSW following.                   Action/Plan:dj/c plan SNF.   Expected Discharge Date:   (unknown)               Expected Discharge Plan:  Skilled Nursing Facility  In-House Referral:  Clinical Social Work  Discharge planning Services  CM Consult  Post Acute Care Choice:    Choice offered to:     DME Arranged:    DME Agency:     HH Arranged:    [place] Agency:     Status of Service:  In process, will continue to follow  Medicare Important Message Given:    Date Medicare IM Given:    Medicare IM give by:    Date Additional Medicare IM Given:    Additional Medicare Important Message give by:     If discussed at [place] of Stay Meetings, dates discussed:    Additional Comments:  Dessa Phi, RN 03/05/2015, 12:23 PM

## 2015-03-05 NOTE — Progress Notes (Signed)
Initial Nutrition Assessment  DOCUMENTATION CODES:   Non-severe (moderate) malnutrition in context of chronic illness  INTERVENTION:  - Continue Ensure Enlive po BID, each supplement provides 350 kcal and 20 grams of protein - Encourage PO intakes of meals and supplements - RD will continue to monitor for needs  NUTRITION DIAGNOSIS:   Unintentional weight loss related to chronic illness, poor appetite as evidenced by percent weight loss.  GOAL:   Patient will meet greater than or equal to 90% of their needs  MONITOR:   PO intake, Supplement acceptance, Weight trends, Labs, Skin, I & O's  REASON FOR ASSESSMENT:   Malnutrition Screening Tool  ASSESSMENT:   82 y.o. male with PMH of chronic atrial fibrillation on Xarelto, chronic systolic CHF (EF A999333), hypertension, and lumbar spinal stenosis who presents from his SNF for evaluation of acute hypoxic respiratory failure. Patient was admitted to [facility] from 02/17/2015 - 02/25/2015, during which time he was aggressively diuresed and treated empirically for CAP with azithromycin and Rocephin. Patient made good progress during the hospitalization, but had persistent O2 requirement and was discharged to SNF on 4 L/m nasal cannula. Today, patient was noted to have saturations in the low 80s on 4 L/m at the SNF and EMS was activated for transport to the hospital. Patient himself reports fatigue and general malaise, but denies chest pain, palpitations, dyspnea, or cough. He denies fever or chills. He has not had rhinorrhea, sore throat, nausea, or vomiting. He reported good improvement during the last hospitalization, but states that shortly after discharge he began to develop worsening fatigue and a nonspecific malaise.  Pt seen for MST. BMI indicates overweight status. No intakes documented. Visualized breakfast tray with 25% completion of oatmeal, 100% of liquids on tray. Pt denies abdominal pain or nausea now or PTA with intakes and  denies chewing or swallowing issues. He states that he has had ongoing poor appetite but is unsure of time frame. Pt also states that he has lost 100 lbs unintentionally in the past 1.5 years. This would indicate 38% wt loss in this time frame which is significant. Per chart review, he has lost 16 lbs (9% body weight) in the past 3 months which is also significant for time frame. Moderate muscle and moderate fat wasting noted to upper body and mild edema to BLE.   He states that he had previously been drinking nutrition supplements but has not been consuming them recently. Pt prefers vanilla flavor. He has Ensure ordered BID but states he has not yet received this item. Encouraged pt to accept and consume supplement when it is offered.  Likely not meeting needs PTA. Medications reviewed; he received 40 mg IV Lasix x1 dose yesterday and is scheduled to receive the same today. Labs reviewed; BUN/creatinine elevated but trending down, Ca: 8.1 mg/dL, GFR: 37.   Diet Order:  Diet Heart Room service appropriate?: Yes; Fluid consistency:: Thin; Fluid restriction:: 1500 mL Fluid  Skin:  Wound (see comment) (Stage 2 sacral ulcer)  Last BM:  2/16  Height:   Ht Readings from Last 1 Encounters:  03/05/15 5\' 5"  (1.651 m)    Weight:   Wt Readings from Last 1 Encounters:  03/05/15 162 lb 14.7 oz (73.9 kg)    Ideal Body Weight:  61.82 kg (kg)  BMI:  Body mass index is 27.11 kg/(m^2).  Estimated Nutritional Needs:   Kcal:  1625-1850 (22-25 kcal/kg)  Protein:  75-85 grams  Fluid:  >/= 2 L/day  EDUCATION NEEDS:  No education needs identified at this time     Jarome Matin, [place], [facility] Inpatient Clinical Dietitian Pager # [phone] After hours/weekend pager # [phone]

## 2015-03-05 NOTE — Progress Notes (Signed)
PT Cancellation Note  Patient Details Name: Harold Jennings MRN: [id number] DOB: October 23, 1932   Cancelled Treatment:    Reason Eval/Treat Not Completed: Patient declined, no reason specified; pt declined stating he wanted to sleep, also on NRB with sats in [place] 03/05/2015, 2:44 PM

## 2015-03-05 NOTE — Progress Notes (Addendum)
Patient ID: Harold Jennings, male   DOB: 1932/01/21, 82 y.o.   MRN: [id number]  [facility] PROGRESS NOTE  Harold Jennings [id number] DOB: 23-Apr-1932 DOA: 03/04/2015 PCP: London Pepper, MD   Brief narrative:    82 y.o. male with PMH of chronic atrial fibrillation on Xarelto, chronic systolic CHF (EF [id number]), hypertension, and lumbar spinal stenosis who presented from his SNF for evaluation of acute hypoxic respiratory failure. Patient was admitted to [facility] from 02/17/2015 - 02/25/2015, during which time he was aggressively diuresed and treated empirically for CAP with azithromycin and Rocephin. His symptoms have gotten worse in the past 24 hours, was dyspneic at rest and with minimal exertional.  In ED, patient was found to be afebrile, saturating in the 80s on 4 L/m supplemental oxygen, and with remaining vital signs stable. A chest x-ray demonstrated diffuse bilateral patchy opacities concerning for pneumonia, with edema less likely. EKG featured atrial fibrillation with right bundle branch block and borderline ST depression in leads V4 to V6. Chem panel notable for a mild hyponatremia, serum bicarbonate of 19, and serum creatinine of 1.79 which is up from his baseline of 1, but improving from time of recent discharge. BNP returned elevated to 1500 and troponin is also elevated to 0.75. CBC is unremarkable. In the emergency department, the patient was given a 40 mg IV push of Lasix and treated with empiric vancomycin and cefepime. His O2 sat duration and improved to the 90s on oxi-mask, he remained hemodynamically stable.   Assessment/Plan:    1. Acute on chronic hypoxic respiratory failure  - Multifactorial with contributions from acute on chronic systolic CHF and HCAP, demand ischemia  - Discharged 1 wk ago on 4 Lpm, but sat persisting <90% at rest at SNF, improved with 10 Lpm via NRB - Management as below   2. Acute on chronic systolic CHF  - TTE (123456) EF 40-45%, mild  LVH, severe LA dilation, mild-moderately elevated PA pressures  - BNP 1500 on arrival, was 820 on 02/17/15  - Diuresing with IV Lasix, keep on 40 mg IV QD for now - monitor strict I/Os, daily wts, SLIV, fluid-restrict diet  - Midodrine 10 mg TID for BP support during diuresis  - Continue ASA 81, Zetia, resume beta-blocker  - has been intolerant to statins and ACE/ARB precluded by CKD  3. HCAP, diffuse, bilateral lobes  - Afeb, no leukocytosis, but CXR findings c/w PNA  - continue cefepime and vancomycin per HCAP guidelines, day #2 - follow up on strep pneumo antigens, send sputum for GS and cx   4. Acute kidney injury  - SCr 1.8 on arrival, up from an apparent baseline of 1, but improving from time of discharge 1wk ago  - continue with diuresis, renal perfusion improving noted by Cr trending down  - Avoid nephrotoxins where feasible   5. Chronic atrial fibrillation  - LA severely dilated on TTE, suggesting that this will be permanent  - Continue AC with Xarelto  - Rate-controlled effectively with Toprol XL, but will need to exercise caution with beta-blockers in acute HF   6. Hyponatremia  - Likely secondary to CHF and the associated relative arterial blood volume depletion (and subsequent increase in ADH production)  - resolved  7. Hypothyroidism - Continue current home-dose Synthroid   8. BPH  - Continue Flomax, finasteride  - Foley in place currently, will d/c as able   9. Elevated cardiac biomarker - Troponin I elevated to 0.75 on admission, repeat troponin  relatively unchanged - Likely secondary to acute CHF; pt denies CP, palpitations, nausea - pt very clear he does not want any invasive interventions, confirms DNR code status   DVT prophylaxis - on full AC Xarelto   Code Status: DNR Family Communication:  plan of care discussed with the patient Disposition Plan: SNF by 2/20  IV access:  Peripheral IV  Procedures and diagnostic studies:    Dg  Chest 2 View 03/04/2015 Diffuse lung opacity with patchy appearance favoring pneumonia over edema. 2. Suspect underlying interstitial lung disease.  Medical Consultants:  None  Other Consultants:  None  IAnti-Infectives:   Vancomycin 2/16 --> Maxipime 2/16 -->  [name], MD  [facility] Pager [phone]  If 7PM-7AM, please contact night-coverage [email or web address] Password [email or web address] 03/05/2015, 4:04 PM   LOS: 1 day   HPI/Subjective: No events overnight. Still concerned with dyspnea at rest and with minimal exertion.   Objective: Filed Vitals:   03/05/15 0525 03/05/15 1000 03/05/15 1459 03/05/15 1500  BP: 116/63 106/62  110/64  Pulse: 84 80  85  Temp: 97.6 F (36.4 C)   97.8 F (36.6 C)  TempSrc: Oral   Oral  Resp: 24   26  Height:      Weight: 73.9 kg (162 lb 14.7 oz)     SpO2: 96%  97%     Intake/Output Summary (Last 24 hours) at 03/05/15 1604 Last data filed at 03/05/15 1459  Gross per 24 hour  Intake      0 ml  Output   3425 ml  Net  -3425 ml    Exam:   General:  Pt is alert, follows commands appropriately, not in acute distress  Cardiovascular: Irregular rate and rhythm, no rubs, no gallops  Respiratory: Course breath sounds with rhonchi bilaterally   Abdomen: Soft, non tender, non distended, bowel sounds present, no guarding  Extremities: pulses DP and PT palpable bilaterally  Neuro: Grossly nonfocal  Data Reviewed: Basic Metabolic Panel:  Recent Labs Lab 03/04/15 2007 03/05/15 0147  NA 134* 136  K 3.9 3.9  CL 107 105  CO2 19* 22  GLUCOSE 105* 116*  BUN 30* 29*  CREATININE 1.79* 1.69*  CALCIUM 8.0* 8.1*   CBC:  Recent Labs Lab 03/04/15 2007 03/05/15 0147  WBC 7.0 11.3*  NEUTROABS  --  7.9*  HGB 14.3 9.8*  HCT 43.1 29.8*  MCV 97.7 100.3*  PLT 250 376   Recent Results (from the past 240 hour(s))  Culture, blood (routine x 2)     Status: None   Collection Time: 02/23/15 11:00 PM  Result Value Ref Range Status   Specimen  Description BLOOD RIGHT ANTECUBITAL  Final   Special Requests BOTTLES DRAWN AEROBIC AND ANAEROBIC 10CC   Final   Culture NO GROWTH 5 DAYS  Final   Report Status 03/01/2015 FINAL  Final  Culture, blood (routine x 2)     Status: None   Collection Time: 02/23/15 11:09 PM  Result Value Ref Range Status   Specimen Description BLOOD RIGHT ARM  Final   Special Requests BOTTLES DRAWN AEROBIC ONLY 10CC  Final   Culture NO GROWTH 5 DAYS  Final   Report Status 03/01/2015 FINAL  Final  Urine culture     Status: None   Collection Time: 02/24/15  8:05 AM  Result Value Ref Range Status   Specimen Description URINE, CATHETERIZED  Final   Special Requests NONE  Final   Culture 30,000 COLONIES/mL YEAST  Final   Report Status  02/25/2015 FINAL  Final  MRSA PCR Screening     Status: None   Collection Time: 03/05/15  9:39 AM  Result Value Ref Range Status   MRSA by PCR NEGATIVE NEGATIVE Final    Comment:        The GeneXpert MRSA Assay (FDA approved for NASAL specimens only), is one component of a comprehensive MRSA colonization surveillance program. It is not intended to diagnose MRSA infection nor to guide or monitor treatment for MRSA infections.      Scheduled Meds: . aspirin EC  81 mg Oral Daily  . ceFEPime (MAXIPIME) IV  2 g Intravenous Q24H  . ezetimibe  10 mg Oral q morning - 10a  . feeding supplement (ENSURE ENLIVE)  237 mL Oral BID BM  . finasteride  5 mg Oral Daily  . levothyroxine  125 mcg Oral QAC breakfast  . metoprolol succinate  25 mg Oral BID  . midodrine  10 mg Oral TID WC  . nystatin-triamcinolone  1 application Topical TID  . phenazopyridine  200 mg Oral TID WC  . potassium chloride  20 mEq Oral BID  . Rivaroxaban  15 mg Oral q1800  . saccharomyces boulardii  250 mg Oral q morning - 10a  . sertraline  50 mg Oral Daily  . sodium chloride flush  3 mL Intravenous Q12H  . tamsulosin  0.4 mg Oral Daily  . vancomycin  1,000 mg Intravenous Q24H   Continuous Infusions:

## 2015-03-05 NOTE — Progress Notes (Addendum)
CRITICAL VALUE ALERT  Critical value received:  Troponin 0.83  Date of notification:  03/05/15  Time of notification:  [id number]  Critical value read back:yes  Nurse who received alert:  Felicity Coyer  MD notified (1st page): Doyle Askew  Time of first page:  1817  MD notified (2nd page):  Time of second page:  Responding MD: Doyle Askew  Time MD responded: 1900

## 2015-03-06 ENCOUNTER — Encounter (HOSPITAL_COMMUNITY): Payer: Self-pay | Admitting: Radiology

## 2015-03-06 ENCOUNTER — Inpatient Hospital Stay (HOSPITAL_COMMUNITY): Payer: Medicare Other

## 2015-03-06 DIAGNOSIS — I10 Essential (primary) hypertension: Secondary | ICD-10-CM

## 2015-03-06 LAB — CBC
HCT: 31 % — ABNORMAL LOW (ref 39.0–52.0)
Hemoglobin: 9.8 g/dL — ABNORMAL LOW (ref 13.0–17.0)
MCH: 32 pg (ref 26.0–34.0)
MCHC: 31.6 g/dL (ref 30.0–36.0)
MCV: 101.3 fL — AB (ref 78.0–100.0)
PLATELETS: 364 10*3/uL (ref 150–400)
RBC: 3.06 MIL/uL — ABNORMAL LOW (ref 4.22–5.81)
RDW: 17.5 % — AB (ref 11.5–15.5)
WBC: 15.9 10*3/uL — AB (ref 4.0–10.5)

## 2015-03-06 LAB — BASIC METABOLIC PANEL
ANION GAP: 9 (ref 5–15)
BUN: 34 mg/dL — ABNORMAL HIGH (ref 6–20)
CO2: 22 mmol/L (ref 22–32)
Calcium: 8.3 mg/dL — ABNORMAL LOW (ref 8.9–10.3)
Chloride: 105 mmol/L (ref 101–111)
Creatinine, Ser: 1.95 mg/dL — ABNORMAL HIGH (ref 0.61–1.24)
GFR, EST AFRICAN AMERICAN: 36 mL/min — AB (ref >60)
GFR, EST NON AFRICAN AMERICAN: 31 mL/min — AB (ref >60)
GLUCOSE: 115 mg/dL — AB (ref 65–99)
POTASSIUM: 4.2 mmol/L (ref 3.5–5.1)
Sodium: 136 mmol/L (ref 135–145)

## 2015-03-06 LAB — PROCALCITONIN: Procalcitonin: 0.2 ng/mL

## 2015-03-06 LAB — MAGNESIUM: MAGNESIUM: 1.8 mg/dL (ref 1.7–2.4)

## 2015-03-06 MED ORDER — IPRATROPIUM BROMIDE 0.02 % IN SOLN
0.5000 mg | Freq: Three times a day (TID) | RESPIRATORY_TRACT | Status: DC
  Administered 2015-03-06 – 2015-03-07 (×2): 0.5 mg via RESPIRATORY_TRACT
  Filled 2015-03-06 (×2): qty 2.5

## 2015-03-06 MED ORDER — LEVALBUTEROL HCL 1.25 MG/0.5ML IN NEBU
1.2500 mg | INHALATION_SOLUTION | Freq: Three times a day (TID) | RESPIRATORY_TRACT | Status: DC
  Administered 2015-03-06 – 2015-03-07 (×2): 1.25 mg via RESPIRATORY_TRACT
  Filled 2015-03-06 (×4): qty 0.5

## 2015-03-06 MED ORDER — LEVALBUTEROL HCL 1.25 MG/0.5ML IN NEBU
1.2500 mg | INHALATION_SOLUTION | Freq: Four times a day (QID) | RESPIRATORY_TRACT | Status: DC | PRN
Start: 2015-03-06 — End: 2015-03-17
  Filled 2015-03-06: qty 0.5

## 2015-03-06 MED ORDER — MORPHINE SULFATE (CONCENTRATE) 10 MG/0.5ML PO SOLN
10.0000 mg | ORAL | Status: DC | PRN
  Administered 2015-03-06 – 2015-03-11 (×10): 10 mg via ORAL
  Filled 2015-03-06 (×10): qty 0.5

## 2015-03-06 NOTE — Progress Notes (Signed)
PT Cancellation Note  Patient Details Name: BOSSIE GIAMBRA MRN: [id number] DOB: Dec 30, 1932   Cancelled Treatment:    Reason Eval/Treat Not Completed: Medical issues which prohibited therapy. Currently on NRB-spoke with RN-will hold PT today and check back another day.    Weston Anna, MPT Pager: [phone]

## 2015-03-06 NOTE — Progress Notes (Signed)
Patient found on 12L NRB and O2 L flow increased to 15L per proper use of equipment. RT will draw ABG after patient has been on 15L flow for a minimum of 10-15 min.

## 2015-03-06 NOTE — Clinical Social Work Note (Signed)
Clinical Social Work Assessment  Patient Details  Name: Harold Jennings MRN: [id number] Date of Birth: [birth year]  Date of referral:  03/06/15               Reason for consult:  Facility Placement                Permission sought to share information with:  Facility Art therapist granted to share information::  Yes, Verbal Permission Granted  Name::        Agency::     Relationship::     Contact Information:     Housing/Transportation Living arrangements for the past 2 months:  [name] of Information:  Patient, Spouse Patient Interpreter Needed:  None Criminal Activity/Legal Involvement Pertinent to Current Situation/Hospitalization:  No - Comment as needed Significant Relationships:  Spouse Lives with:  Spouse Do you feel safe going back to the place where you live?  Yes Need for family participation in patient care:  Yes (Comment)  Care giving concerns:  CSW reviewed patient's chart indicating that patient was admitted from [facility], he was just sent there on 2/9 before being readmitted to hospital on 2/16.    Social Worker assessment / plan:  CSW met with patient & wife, Harold Jennings at bedside to discuss eventual discharge plans - patient & wife would prefer that he return home at discharge rather than return to SNF.   Employment status:  Retired Forensic scientist:  Medicare PT Recommendations:  Not assessed at this time Information / Referral to community resources:     Patient/Family's Response to care:  Patient & wife seemed pleased with the care they received at [facility], but would prefer to return home.   Patient/Family's Understanding of and Emotional Response to Diagnosis, Current Treatment, and Prognosis:  Patient/wife are concerned about non-rebreather, patient was not on oxygen at all prior to previous hospitalization at [facility].   Emotional Assessment Appearance:  Appears stated age Attitude/Demeanor/Rapport:     Affect (typically observed):    Orientation:  Oriented to Self, Oriented to Place, Oriented to  Time, Oriented to Situation Alcohol / Substance use:    Psych involvement (Current and /or in the community):     Discharge Needs  Concerns to be addressed:    Readmission within the last 30 days:    Current discharge risk:    Barriers to Discharge:      Standley Brooking, LCSW 03/06/2015, 1:34 PM

## 2015-03-06 NOTE — Progress Notes (Signed)
Patient remains on non-re breather mask at 100%. RN contacted by CMT due to abnormal pulse-ox reading. Patient encouraged to not remove mask at this time due to decreased oxygen saturation of 68%. Mask reapplied oxygen saturation increased 92%. Will continue to monitor.

## 2015-03-06 NOTE — Progress Notes (Signed)
Rt unable to obtain ABG. RN aware.

## 2015-03-06 NOTE — Progress Notes (Signed)
Patient c/o feeling as if bladder is full. Bladder scan completed, 38 cc. Will f/u with provider.Will continue to monitor

## 2015-03-06 NOTE — Progress Notes (Signed)
Patient ID: Harold Jennings, male   DOB: December 21, 1932, 82 y.o.   MRN: [id number]  [facility] PROGRESS NOTE  Harold Jennings [id number] DOB: 05-27-1932 DOA: 03/04/2015 PCP: London Pepper, MD   Brief narrative:    82 y.o. male with PMH of chronic atrial fibrillation on Xarelto, chronic systolic CHF (EF A999333), hypertension, and lumbar spinal stenosis who presented from his SNF for evaluation of acute hypoxic respiratory failure. Patient was admitted to [facility] from 02/17/2015 - 02/25/2015, during which time he was aggressively diuresed and treated empirically for CAP with azithromycin and Rocephin. His symptoms have gotten worse in the past 24 hours, was dyspneic at rest and with minimal exertional.  In ED, patient was found to be afebrile, saturating in the 80s on 4 L/m supplemental oxygen, and with remaining vital signs stable. A chest x-ray demonstrated diffuse bilateral patchy opacities concerning for pneumonia, with edema less likely. EKG featured atrial fibrillation with right bundle branch block and borderline ST depression in leads V4 to V6. Chem panel notable for a mild hyponatremia, serum bicarbonate of 19, and serum creatinine of 1.79 which is up from his baseline of 1, but improving from time of recent discharge. BNP returned elevated to 1500 and troponin is also elevated to 0.75. CBC is unremarkable. In the emergency department, the patient was given a 40 mg IV push of Lasix and treated with empiric vancomycin and cefepime. His O2 sat duration and improved to the 90s on oxi-mask, he remained hemodynamically stable.  Major events since admission: 2/18 - still on NRB, unable to wean off, CT chest requested    Assessment/Plan:    1. Acute on chronic hypoxic respiratory failure  - Multifactorial with contributions from acute on chronic systolic CHF and HCAP, demand ischemia  - Discharged 1 wk ago on 4 Lpm, but sat persisting <90% at rest at SNF, improved with 10 Lpm via NRB -  still requiring NRB and unable to taper down - CT chest requested for further evaluation   2. Acute on chronic systolic CHF  - TTE (123456) EF 40-45%, mild LVH, severe LA dilation, mild-moderately elevated PA pressures  - BNP 1500 on arrival, was 820 on 02/17/15  - Diuresing with IV Lasix, keep on 40 mg IV QD for now - weight dwon since admission 164 lbs --> 159 lbs this AM - monitor strict I/Os, daily wts, SLIV, fluid-restrict diet  - Midodrine 10 mg TID for BP support during diuresis  - Continue ASA 81, Zetia, resume beta-blocker  - has been intolerant to statins and ACE/ARB precluded by CKD  3. HCAP, diffuse, bilateral lobes  - Afeb, no leukocytosis, but CXR findings c/w PNA  - continue cefepime and vancomycin per HCAP guidelines, day #3 - follow up on strep pneumo antigens, sputum for GS and cx  - chest CT requested for clearer evaluation   4. Acute kidney injury  - SCr 1.8 on arrival, up from an apparent baseline of 1, but improving from time of discharge 1wk ago  - continue with diuresis, may be able to stop vancomycin if negative cultures  - Avoid nephrotoxins where feasible   5. Chronic atrial fibrillation  - LA severely dilated on TTE, suggesting that this will be permanent  - Continue AC with Xarelto  - Rate-controlled effectively with Toprol XL, but will need to exercise caution with beta-blockers in acute HF   6. Hyponatremia  - Likely secondary to CHF and the associated relative arterial blood volume depletion (and subsequent  increase in ADH production)  - resolved  7. Hypothyroidism - Continue current home-dose Synthroid   8. BPH  - Continue Flomax, finasteride  - Foley in place currently, will d/c as able   9. Elevated cardiac biomarker - Troponin I elevated to 0.75 on admission, repeat troponin relatively unchanged - Likely secondary to acute CHF; pt denies CP, palpitations, nausea - pt very clear he does not want any invasive interventions,  confirms DNR code status   DVT prophylaxis - on full AC Xarelto   Code Status: DNR Family Communication:  plan of care discussed with the patient Disposition Plan: SNF by 2/20  IV access:  Peripheral IV  Procedures and diagnostic studies:    Dg Chest 2 View 03/04/2015 Diffuse lung opacity with patchy appearance favoring pneumonia over edema. 2. Suspect underlying interstitial lung disease.  Medical Consultants:  None  Other Consultants:  None  IAnti-Infectives:   Vancomycin 2/16 --> Maxipime 2/16 -->  [name], MD  [facility] Pager [phone]  If 7PM-7AM, please contact night-coverage [email or web address] Password [email or web address] 03/06/2015, 2:18 PM   LOS: 2 days   HPI/Subjective: No events overnight. Still concerned with dyspnea at rest and with minimal exertion.   Objective: Filed Vitals:   03/05/15 2208 03/06/15 0523 03/06/15 1029 03/06/15 1327  BP: 129/83 102/62 106/68 108/79  Pulse: 88 71 86 89  Temp: 97.3 F (36.3 C) 97.8 F (36.6 C)  97.3 F (36.3 C)  TempSrc: Oral Oral  Axillary  Resp: 24 22  21   Height:      Weight:  72.2 kg (159 lb 2.8 oz)    SpO2: 93% 94%  95%    Intake/Output Summary (Last 24 hours) at 03/06/15 1418 Last data filed at 03/06/15 0526  Gross per 24 hour  Intake    250 ml  Output   2900 ml  Net  -2650 ml    Exam:   General:  Pt is alert, follows commands appropriately, not in acute distress  Cardiovascular: Irregular rate and rhythm, no rubs, no gallops  Respiratory: Course breath sounds with rhonchi bilaterally, mild tachypnea and RR in mid 20's  Abdomen: Soft, non tender, non distended, bowel sounds present, no guarding  Extremities: pulses DP and PT palpable bilaterally  Neuro: Grossly nonfocal  Data Reviewed: Basic Metabolic Panel:  Recent Labs Lab 03/04/15 2007 03/05/15 0147 03/06/15 0553  NA 134* 136 136  K 3.9 3.9 4.2  CL 107 105 105  CO2 19* 22 22  GLUCOSE 105* 116* 115*  BUN 30* 29* 34*  CREATININE 1.79* 1.69*  1.95*  CALCIUM 8.0* 8.1* 8.3*  MG  --   --  1.8   CBC:  Recent Labs Lab 03/04/15 2007 03/05/15 0147 03/06/15 0553  WBC 7.0 11.3* 15.9*  NEUTROABS  --  7.9*  --   HGB 14.3 9.8* 9.8*  HCT 43.1 29.8* 31.0*  MCV 97.7 100.3* 101.3*  PLT 250 376 364   Recent Results (from the past 240 hour(s))  MRSA PCR Screening     Status: None   Collection Time: 03/05/15  9:39 AM  Result Value Ref Range Status   MRSA by PCR NEGATIVE NEGATIVE Final    Comment:        The GeneXpert MRSA Assay (FDA approved for NASAL specimens only), is one component of a comprehensive MRSA colonization surveillance program. It is not intended to diagnose MRSA infection nor to guide or monitor treatment for MRSA infections.      Scheduled Meds: . aspirin EC  81 mg Oral Daily  . ceFEPime (MAXIPIME) IV  2 g Intravenous Q24H  . ezetimibe  10 mg Oral q morning - 10a  . feeding supplement (ENSURE ENLIVE)  237 mL Oral BID BM  . finasteride  5 mg Oral Daily  . furosemide  40 mg Intravenous Daily  . levothyroxine  125 mcg Oral QAC breakfast  . metoprolol succinate  25 mg Oral BID  . midodrine  10 mg Oral TID WC  . nystatin-triamcinolone  1 application Topical TID  . potassium chloride  20 mEq Oral BID  . Rivaroxaban  15 mg Oral q1800  . saccharomyces boulardii  250 mg Oral q morning - 10a  . sertraline  50 mg Oral Daily  . sodium chloride flush  3 mL Intravenous Q12H  . tamsulosin  0.4 mg Oral Daily  . vancomycin  1,000 mg Intravenous Q24H   Continuous Infusions:

## 2015-03-07 LAB — CBC
HEMATOCRIT: 29.8 % — AB (ref 39.0–52.0)
HEMOGLOBIN: 9.6 g/dL — AB (ref 13.0–17.0)
MCH: 32.4 pg (ref 26.0–34.0)
MCHC: 32.2 g/dL (ref 30.0–36.0)
MCV: 100.7 fL — AB (ref 78.0–100.0)
Platelets: 328 10*3/uL (ref 150–400)
RBC: 2.96 MIL/uL — ABNORMAL LOW (ref 4.22–5.81)
RDW: 17.5 % — ABNORMAL HIGH (ref 11.5–15.5)
WBC: 15 10*3/uL — ABNORMAL HIGH (ref 4.0–10.5)

## 2015-03-07 LAB — BASIC METABOLIC PANEL
Anion gap: 8 (ref 5–15)
BUN: 40 mg/dL — AB (ref 6–20)
CHLORIDE: 100 mmol/L — AB (ref 101–111)
CO2: 23 mmol/L (ref 22–32)
CREATININE: 2.1 mg/dL — AB (ref 0.61–1.24)
Calcium: 8.1 mg/dL — ABNORMAL LOW (ref 8.9–10.3)
GFR calc Af Amer: 33 mL/min — ABNORMAL LOW (ref >60)
GFR calc non Af Amer: 28 mL/min — ABNORMAL LOW (ref >60)
GLUCOSE: 88 mg/dL (ref 65–99)
Potassium: 4.8 mmol/L (ref 3.5–5.1)
SODIUM: 131 mmol/L — AB (ref 135–145)

## 2015-03-07 MED ORDER — DEXTROSE 5 % IV SOLN
100.0000 mg | Freq: Two times a day (BID) | INTRAVENOUS | Status: DC
  Administered 2015-03-07 – 2015-03-11 (×8): 100 mg via INTRAVENOUS
  Filled 2015-03-07 (×8): qty 100

## 2015-03-07 MED ORDER — DEXTROSE 5 % IV SOLN
1.0000 g | INTRAVENOUS | Status: DC
  Administered 2015-03-07 – 2015-03-10 (×4): 1 g via INTRAVENOUS
  Filled 2015-03-07 (×4): qty 1

## 2015-03-07 MED ORDER — METOPROLOL SUCCINATE ER 25 MG PO TB24
12.5000 mg | ORAL_TABLET | Freq: Two times a day (BID) | ORAL | Status: DC
  Administered 2015-03-07 – 2015-03-15 (×17): 12.5 mg via ORAL
  Administered 2015-03-15: 23:00:00 via ORAL
  Administered 2015-03-16 – 2015-03-17 (×3): 12.5 mg via ORAL
  Filled 2015-03-07 (×21): qty 1

## 2015-03-07 MED ORDER — FUROSEMIDE 10 MG/ML IJ SOLN
40.0000 mg | Freq: Two times a day (BID) | INTRAMUSCULAR | Status: DC
  Administered 2015-03-07 – 2015-03-08 (×3): 40 mg via INTRAVENOUS
  Filled 2015-03-07 (×2): qty 4

## 2015-03-07 MED ORDER — DEXTROSE 5 % IV SOLN: 1.0000 g | Freq: Two times a day (BID) | INTRAVENOUS | Status: DC

## 2015-03-07 NOTE — Progress Notes (Signed)
Patient ID: Harold Jennings, male   DOB: 06-29-1932, 82 y.o.   MRN: [id number]  [facility] PROGRESS NOTE  Harold Jennings [id number] DOB: 1932-02-23 DOA: 03/04/2015 PCP: London Pepper, MD   Brief narrative:    82 y.o. male with PMH of chronic atrial fibrillation on Xarelto, chronic systolic CHF (EF A999333), hypertension, and lumbar spinal stenosis who presented from his SNF for evaluation of acute hypoxic respiratory failure. Patient was admitted to [facility] from 02/17/2015 - 02/25/2015, during which time he was aggressively diuresed and treated empirically for CAP with azithromycin and Rocephin. His symptoms have gotten worse in the past 24 hours, was dyspneic at rest and with minimal exertional.  In ED, patient was found to be afebrile, saturating in the 80s on 4 L/m supplemental oxygen, and with remaining vital signs stable. A chest x-ray demonstrated diffuse bilateral patchy opacities concerning for pneumonia, with edema less likely. EKG featured atrial fibrillation with right bundle branch block and borderline ST depression in leads V4 to V6. Chem panel notable for a mild hyponatremia, serum bicarbonate of 19, and serum creatinine of 1.79 which is up from his baseline of 1, but improving from time of recent discharge. BNP returned elevated to 1500 and troponin is also elevated to 0.75. CBC is unremarkable. In the emergency department, the patient was given a 40 mg IV push of Lasix and treated with empiric vancomycin and cefepime. His O2 sat duration and improved to the 90s on oxi-mask, he remained hemodynamically stable.  Major events since admission: 2/18 - still on NRB, unable to wean off, CT chest requested  2/19 - added doxycycline for atypical coverage, also added Lasix 40 mg in addition to QD dosing for better diuresis    Assessment/Plan:    1. Acute on chronic hypoxic respiratory failure  - Multifactorial with contributions from acute on chronic systolic CHF and HCAP,  demand ischemia  - Discharged 1 wk ago on 4 Lpm, but sat persisting <90% at rest at SNF, improved with 10 Lpm via NRB - still requiring NRB and unable to taper down - CT chest requested for further evaluation, persistent cardiomegaly with pulmonary vascular congestion and ? Atypical PNA - will stop vancomycin and add doxycycline to maxipime for now - if not better, may need coverage for aspiration (flagyl)   2. Acute on chronic systolic CHF  - TTE (123456) EF 40-45%, mild LVH, severe LA dilation, mild-moderately elevated PA pressures  - BNP 1500 on arrival, was 820 on 02/17/15  - Diuresing with IV Lasix, keep on 40 mg IV but change to BID  - weight dwon since admission 164 lbs --> 158 lbs this AM - monitor strict I/Os, daily wts, SLIV, fluid-restrict diet  - Midodrine 10 mg TID for BP support during diuresis  - Continue ASA 81, Zetia, resume beta-blocker  - has been intolerant to statins and ACE/ARB precluded by CKD - consider cardio consult in AM  3. HCAP, diffuse, bilateral lobes  - CXR findings c/w PNA and per CT chest ? Atypical PNA - continue cefepime day #4, stop vancomycin, add doxycyclne  - follow up on strep pneumo antigens, sputum for GS and cx  - consider PCCM input if pt not better in next 24 hours   4. Acute kidney injury  - SCr 1.8 on arrival, up from an apparent baseline of 1 - continue with diuresis, stop vancomycin  - Avoid nephrotoxins where feasible  - BMP In AM  5. Chronic atrial fibrillation  -  LA severely dilated on TTE, suggesting that this will be permanent  - Continue AC with Xarelto  - Rate-controlled effectively with Toprol XL, lower the dose of Toprol due to lower BP  6. Hyponatremia  - Likely secondary to CHF and the associated relative arterial blood volume depletion (and subsequent increase in ADH production)  - BMP in AM  7. Hypothyroidism - Continue current home-dose Synthroid   8. BPH  - Continue Flomax, finasteride  - Foley  in place currently, will d/c as able   9. Elevated cardiac biomarker - Troponin I elevated to 0.75 on admission, repeat troponin relatively unchanged - Likely secondary to acute CHF; pt denies CP, palpitations, nausea - pt very clear he does not want any invasive interventions, confirms DNR code status   DVT prophylaxis - on full AC Xarelto   Code Status: DNR Family Communication:  plan of care discussed with the patient Disposition Plan: SNF by 2/22  IV access:  Peripheral IV  Procedures and diagnostic studies:    Dg Chest 2 View 03/04/2015 Diffuse lung opacity with patchy appearance favoring pneumonia over edema. 2. Suspect underlying interstitial lung disease.  Medical Consultants:  None  Other Consultants:  None  IAnti-Infectives:   Vancomycin 2/16 --> 2/19 Maxipime 2/16 --> Doxycycline 2/19 -->  [name], MD  [facility] Pager [phone]  If 7PM-7AM, please contact night-coverage [email or web address] Password [email or web address] 03/07/2015, 6:15 PM   LOS: 3 days   HPI/Subjective: No events overnight. Still concerned with dyspnea at rest and with minimal exertion.   Objective: Filed Vitals:   03/06/15 2002 03/06/15 2200 03/07/15 0600 03/07/15 1344  BP:  105/53 116/73 112/62  Pulse:  80 82 95  Temp:  97.9 F (36.6 C) 97.3 F (36.3 C) 97.5 F (36.4 C)  TempSrc:  Axillary Axillary Axillary  Resp:   23 22  Height:      Weight:   72 kg (158 lb 11.7 oz)   SpO2: 95% 97% 93% 91%    Intake/Output Summary (Last 24 hours) at 03/07/15 1815 Last data filed at 03/07/15 1347  Gross per 24 hour  Intake      0 ml  Output   1575 ml  Net  -1575 ml    Exam:   General:  Pt is alert, follows commands appropriately, not in acute distress  Cardiovascular: Irregular rate and rhythm, no rubs, no gallops  Respiratory: Course breath sounds with rhonchi bilaterally, mild tachypnea and RR in mid 20's  Abdomen: Soft, non tender, non distended, bowel sounds present, no  guarding  Extremities: pulses DP and PT palpable bilaterally  Neuro: Grossly nonfocal  Data Reviewed: Basic Metabolic Panel:  Recent Labs Lab 03/04/15 2007 03/05/15 0147 03/06/15 0553 03/07/15 0609  NA 134* 136 136 131*  K 3.9 3.9 4.2 4.8  CL 107 105 105 100*  CO2 19* 22 22 23   GLUCOSE 105* 116* 115* 88  BUN 30* 29* 34* 40*  CREATININE 1.79* 1.69* 1.95* 2.10*  CALCIUM 8.0* 8.1* 8.3* 8.1*  MG  --   --  1.8  --    CBC:  Recent Labs Lab 03/04/15 2007 03/05/15 0147 03/06/15 0553 03/07/15 0609  WBC 7.0 11.3* 15.9* 15.0*  NEUTROABS  --  7.9*  --   --   HGB 14.3 9.8* 9.8* 9.6*  HCT 43.1 29.8* 31.0* 29.8*  MCV 97.7 100.3* 101.3* 100.7*  PLT 250 376 364 328   Recent Results (from the past 240 hour(s))  MRSA PCR Screening  Status: None   Collection Time: 03/05/15  9:39 AM  Result Value Ref Range Status   MRSA by PCR NEGATIVE NEGATIVE Final    Comment:        The GeneXpert MRSA Assay (FDA approved for NASAL specimens only), is one component of a comprehensive MRSA colonization surveillance program. It is not intended to diagnose MRSA infection nor to guide or monitor treatment for MRSA infections.      Scheduled Meds: . aspirin EC  81 mg Oral Daily  . ceFEPime (MAXIPIME) IV  1 g Intravenous Q24H  . doxycycline (VIBRAMYCIN) IV  100 mg Intravenous Q12H  . ezetimibe  10 mg Oral q morning - 10a  . feeding supplement (ENSURE ENLIVE)  237 mL Oral BID BM  . finasteride  5 mg Oral Daily  . furosemide  40 mg Intravenous BID  . levothyroxine  125 mcg Oral QAC breakfast  . metoprolol succinate  12.5 mg Oral BID  . midodrine  10 mg Oral TID WC  . nystatin-triamcinolone  1 application Topical TID  . potassium chloride  20 mEq Oral BID  . Rivaroxaban  15 mg Oral q1800  . saccharomyces boulardii  250 mg Oral q morning - 10a  . sertraline  50 mg Oral Daily  . sodium chloride flush  3 mL Intravenous Q12H  . tamsulosin  0.4 mg Oral Daily   Continuous Infusions:

## 2015-03-07 NOTE — Progress Notes (Signed)
Pharmacy Antibiotic Note  Harold Jennings is a 82 y.o. male admitted on 03/04/2015 with acute hypoxic respiratory failure and suspected pneumonia.  He was recently treated at [facility] (2/1-2/9) for CAP with azithromycin and ceftriaxone.  Pharmacy has been consulted for vancomycin and cefepime dosing. He has been unable to wean from NRB oxygen and today his SCr continues to increase.  Plan:  Reduce to Cefepime 1g IV q24 hours  Continue Vancomycin 1g IV q24 hours.    Check vanc trough at steady state.  Goal trough 15-20 mcg/mL.  Follow up renal fxn, culture results, and clinical course.  Follow up plans for antibiotic de-escalation.  Height: 5\' 5"  (165.1 cm) Weight: 158 lb 11.7 oz (72 kg) IBW/kg (Calculated) : 61.5  Temp (24hrs), Avg:97.5 F (36.4 C), Min:97.3 F (36.3 C), Max:97.9 F (36.6 C)   Recent Labs Lab 03/04/15 2007 03/04/15 2345 03/05/15 0146 03/05/15 0147 03/06/15 0553 03/07/15 0609  WBC 7.0  --   --  11.3* 15.9* 15.0*  CREATININE 1.79*  --   --  1.69* 1.95* 2.10*  LATICACIDVEN  --  1.2 1.1  --   --   --     Estimated Creatinine Clearance: 24.4 mL/min (by C-G formula based on Cr of 2.1).    Allergies  Allergen Reactions  . Oxybutynin Anaphylaxis  . Latex Itching and Rash    Antimicrobials this admission: 2/16 >> vanc >> 2/16 >> cefepime >>    Dose adjustments this admission: 2/19 2100 Vancomycin trough  Microbiology results: 2/17 MRSA PCR: negative 2/17 Urine strep pneumo Ag: negative 2/17 Urine Legionella Ag: negative  Thank you for allowing pharmacy to be a part of this patient's care.  Gretta Arab PharmD, BCPS Pager [phone] 03/07/2015 12:10 PM

## 2015-03-07 NOTE — Progress Notes (Signed)
PT Cancellation Note  Patient Details Name: Harold Jennings MRN: [id number] DOB: March 09, 1932   Cancelled Treatment:    Reason Eval/Treat Not Completed: Patient not medically ready. Spoke with MD who requested therapy be held on today. Will check back another day.    Weston Anna, MPT Pager: [phone]

## 2015-03-08 DIAGNOSIS — I482 Chronic atrial fibrillation: Secondary | ICD-10-CM

## 2015-03-08 DIAGNOSIS — Z515 Encounter for palliative care: Secondary | ICD-10-CM

## 2015-03-08 DIAGNOSIS — N179 Acute kidney failure, unspecified: Secondary | ICD-10-CM

## 2015-03-08 DIAGNOSIS — I509 Heart failure, unspecified: Secondary | ICD-10-CM

## 2015-03-08 DIAGNOSIS — J189 Pneumonia, unspecified organism: Secondary | ICD-10-CM

## 2015-03-08 DIAGNOSIS — Z7189 Other specified counseling: Secondary | ICD-10-CM | POA: Insufficient documentation

## 2015-03-08 DIAGNOSIS — R7989 Other specified abnormal findings of blood chemistry: Secondary | ICD-10-CM

## 2015-03-08 DIAGNOSIS — J9601 Acute respiratory failure with hypoxia: Secondary | ICD-10-CM

## 2015-03-08 LAB — LEGIONELLA ANTIGEN, URINE

## 2015-03-08 LAB — BASIC METABOLIC PANEL
ANION GAP: 9 (ref 5–15)
BUN: 41 mg/dL — AB (ref 6–20)
CALCIUM: 8.4 mg/dL — AB (ref 8.9–10.3)
CO2: 23 mmol/L (ref 22–32)
Chloride: 98 mmol/L — ABNORMAL LOW (ref 101–111)
Creatinine, Ser: 2.14 mg/dL — ABNORMAL HIGH (ref 0.61–1.24)
GFR calc Af Amer: 32 mL/min — ABNORMAL LOW (ref >60)
GFR, EST NON AFRICAN AMERICAN: 27 mL/min — AB (ref >60)
GLUCOSE: 94 mg/dL (ref 65–99)
Potassium: 5.1 mmol/L (ref 3.5–5.1)
SODIUM: 130 mmol/L — AB (ref 135–145)

## 2015-03-08 LAB — CBC
HCT: 31.1 % — ABNORMAL LOW (ref 39.0–52.0)
Hemoglobin: 10 g/dL — ABNORMAL LOW (ref 13.0–17.0)
MCH: 32.5 pg (ref 26.0–34.0)
MCHC: 32.2 g/dL (ref 30.0–36.0)
MCV: 101 fL — AB (ref 78.0–100.0)
PLATELETS: 358 10*3/uL (ref 150–400)
RBC: 3.08 MIL/uL — AB (ref 4.22–5.81)
RDW: 17.5 % — AB (ref 11.5–15.5)
WBC: 13.1 10*3/uL — AB (ref 4.0–10.5)

## 2015-03-08 LAB — SEDIMENTATION RATE: Sed Rate: 120 mm/hr — ABNORMAL HIGH (ref 0–16)

## 2015-03-08 LAB — PROCALCITONIN: Procalcitonin: 0.21 ng/mL

## 2015-03-08 MED ORDER — FUROSEMIDE 40 MG PO TABS
40.0000 mg | ORAL_TABLET | Freq: Every day | ORAL | Status: DC
  Administered 2015-03-09: 40 mg via ORAL
  Filled 2015-03-08: qty 1

## 2015-03-08 MED ORDER — SODIUM CHLORIDE 0.9 % IV SOLN
500.0000 mg | Freq: Two times a day (BID) | INTRAVENOUS | Status: DC
  Administered 2015-03-08 – 2015-03-11 (×6): 500 mg via INTRAVENOUS
  Filled 2015-03-08 (×6): qty 4

## 2015-03-08 NOTE — Consult Note (Signed)
Consultation Note Date: 03/08/2015   Patient Name: Harold Jennings  DOB: 1932/10/03  MRN: [id number]  Age / Sex: 82 y.o., male  PCP: London Pepper, MD Referring Physician: Theodis Blaze, MD  Reason for Consultation: Establishing goals of care  Clinical Assessment/Narrative: 82 y.o. male with PMH of chronic atrial fibrillation on Xarelto, chronic systolic CHF (EF 17-00%), hypertension, and lumbar spinal stenosis who presented from his SNF for evaluation of acute hypoxic respiratory failure. Patient was admitted to [facility] from 02/17/2015 - 02/25/2015, during which time he was aggressively diuresed and treated empirically for CAP with azithromycin and Rocephin. He presented back to the ED with increasing dyspnea. He is currently been treated for congestive heart failure exacerbation as well as suspected pneumonia. He has not been improving and remains reliant on nonrebreather to maintain his oxygen saturations.  Palliative consulted for goals of care.  I met with the patient, his wife, and a friend. We discussed his clinical course this point in time including the fact that he has not shown improvement with antibiotic and diuretic therapy.  He is going to be evaluated by pulmonary in order to determine further recommendations for treatment.   SUMMARY OF RECOMMENDATIONS - Pulmonary is evaluating patient for further recommendations on treatment of his lung disease.  - The patient stated if he is not going to improve, he is comfortable with the fact that he is going to die. He reports that he is getting tired and if he has reached the end of his life he would like to know in order to arrange to go home.  -  If he does not improve, I would recommend arranging for Oxymizer as this is most likely way he'll be able to  transition home with hospice support. I discussed this with patient and his wife and they are both comfortable with  this plan.   Code Status/Advance Care Planning: DNR    Code Status Orders        Start     Ordered   03/04/15 2252  Do not attempt resuscitation (DNR)   Continuous    Question Answer Comment  In the event of cardiac or respiratory ARREST Do not call a "code blue"   In the event of cardiac or respiratory ARREST Do not perform Intubation, CPR, defibrillation or ACLS   In the event of cardiac or respiratory ARREST Use medication by any route, position, wound care, and other measures to relive pain and suffering. May use oxygen, suction and manual treatment of airway obstruction as needed for comfort.      03/04/15 2257    Code Status History    Date Active Date Inactive Code Status Order ID Comments User Context   02/19/2015  8:46 AM 02/25/2015  5:59 PM Full Code [id number]  Jonetta Osgood, MD Inpatient   02/17/2015  6:27 PM 02/19/2015  8:46 AM DNR [id number]  Lavina Hamman, MD ED   07/12/2014  5:02 PM 07/14/2014  3:27 PM Full Code [id number]  Robbie Lis, MD Inpatient   06/12/2014  1:12 PM 06/23/2014  3:25 PM Full Code [id number]  Armandina Gemma, MD Inpatient   04/09/2014 12:59 AM 04/11/2014  2:52 PM Full Code [id number]  Lavina Hamman, MD ED   01/14/2014 11:39 PM 01/19/2014  6:56 PM Full Code [id number]  Rise Patience, MD Inpatient    Advance Directive Documentation        Most Recent Value   Type of Advance Directive  Living  will, Healthcare Power of Attorney   Pre-existing out of facility DNR order (yellow form or pink MOST form)     "MOST" Form in Place?       Symptom Management:   Dyspnea: Agree with morphine on as-needed basis. I encouraged him to use it as needed as he reports it has been helpful when he has taken it   Palliative Prophylaxis:   Aspiration, Bowel Regimen, Delirium Protocol and Frequent Pain Assessment  Psycho-social/Spiritual:  Support System: Strong Desire for further Chaplaincy support:Yes Additional Recommendations: Education on Hospice  Prognosis: Unable to  determine due to acute illness. If he does not improve he will qualify for hospice support.  Discharge Planning: To be determined   Chief Complaint/ Primary Diagnoses: Present on Admission:  . Acute on chronic systolic CHF (congestive heart failure) ([place]) . HCAP (healthcare-associated pneumonia) . ARF (acute renal failure) ([place]) . Chronic atrial fibrillation ([place]) . Essential hypertension . Hypothyroidism . Hyponatremia . Acute respiratory failure with hypoxia ([place])  I have reviewed the medical record, interviewed the patient and family, and examined the patient. The following aspects are pertinent.  Past Medical History  Diagnosis Date  . Dyslipidemia   . HTN (hypertension)   . Obesity   . Increased liver enzymes     felt to be a fatty liver per ultrasound in 2011  . Dyspnea   . Ischemic cardiomyopathy   . Spinal stenosis   . Sleep apnea     bipap   . CAD (coronary artery disease)     Silent MI sometime prior to 2000. S/P cath in 2000 showing a total mid RCA with collaterals from the LCX. EF is 40 to50%; Last Myoview in 2010 showing inferior scar without ischemia. EF was 52%.  Marland Kitchen Dysrhythmia     atrial fib   . Edema     lower extremity   . Cancer ([place])     skin cancer   . Anemia   . Diverticular disease dec 2015  . Umbilical hernia   . Myocardial infarction ([place])   . CHF (congestive heart failure) ([place])    Social History   Social History  . Marital Status: Married    Spouse Name: N/A  . Number of Children: 2  . Years of Education: N/A   Occupational History  . retired    Social History Main Topics  . Smoking status: Never Smoker   . Smokeless tobacco: Never Used  . Alcohol Use: Yes     Comment: used to take 2 glasses of wine daily, but has not for a while   . Drug Use: No  . Sexual Activity: No   Other Topics Concern  . None   Social History Narrative   Family History  Problem Relation Age of Onset  . Stroke Father   . Heart attack Father   .  Hypertension Father   . Diabetes Father   . Drug abuse Mother   . Hypertension Brother   . Hypertension Brother   . Hypertension Brother   . Hypertension Sister    Scheduled Meds: . aspirin EC  81 mg Oral Daily  . ceFEPime (MAXIPIME) IV  1 g Intravenous Q24H  . doxycycline (VIBRAMYCIN) IV  100 mg Intravenous Q12H  . ezetimibe  10 mg Oral q morning - 10a  . feeding supplement (ENSURE ENLIVE)  237 mL Oral BID BM  . finasteride  5 mg Oral Daily  . furosemide  40 mg Oral Daily  . levothyroxine  125 mcg Oral QAC breakfast  . methylPREDNISolone (SOLU-MEDROL) injection  500 mg Intravenous Q12H  . metoprolol succinate  12.5 mg Oral BID  . midodrine  10 mg Oral TID WC  . nystatin-triamcinolone  1 application Topical TID  . potassium chloride  20 mEq Oral BID  . Rivaroxaban  15 mg Oral q1800  . saccharomyces boulardii  250 mg Oral q morning - 10a  . sertraline  50 mg Oral Daily  . sodium chloride flush  3 mL Intravenous Q12H  . tamsulosin  0.4 mg Oral Daily   Continuous Infusions:  PRN Meds:.sodium chloride, acetaminophen, levalbuterol, lip balm, loperamide, morphine CONCENTRATE, ondansetron (ZOFRAN) IV, sodium chloride flush Medications Prior to Admission:  Prior to Admission medications   Medication Sig Start Date End Date Taking? Authorizing Provider  aspirin EC 81 MG EC tablet Take 1 tablet (81 mg total) by mouth daily. 02/25/15  Yes Thurnell Lose, MD  ezetimibe (ZETIA) 10 MG tablet Take 10 mg by mouth every morning.    Yes Historical Provider, MD  finasteride (PROSCAR) 5 MG tablet Take 5 mg by mouth daily.   Yes Historical Provider, MD  levothyroxine (SYNTHROID, LEVOTHROID) 125 MCG tablet Take 1 tablet (125 mcg total) by mouth daily before breakfast. 02/25/15  Yes Thurnell Lose, MD  loperamide (IMODIUM) 1 MG/5ML solution Take 10 mLs (2 mg total) by mouth every 6 (six) hours as needed for diarrhea or loose stools. 01/18/14  Yes Robbie Lis, MD  metoprolol succinate (TOPROL-XL) 25 MG  24 hr tablet Take 25 mg by mouth 2 (two) times daily.   Yes Historical Provider, MD  midodrine (PROAMATINE) 10 MG tablet Take 1 tablet (10 mg total) by mouth 3 (three) times daily with meals. 02/25/15  Yes Thurnell Lose, MD  nystatin-triamcinolone (MYCOLOG II) cream Apply 1 application topically 3 (three) times daily. Apply to penis scrotum buttock and thighs every shift for rash   Yes Historical Provider, MD  phenazopyridine (PYRIDIUM) 200 MG tablet Take 200 mg by mouth 3 (three) times daily with meals.   Yes Historical Provider, MD  saccharomyces boulardii (FLORASTOR) 250 MG capsule Take 250 mg by mouth every morning.    Yes Historical Provider, MD  sertraline (ZOLOFT) 50 MG tablet Take 50 mg by mouth daily.   Yes Historical Provider, MD  silodosin (RAPAFLO) 8 MG CAPS capsule Take 8 mg by mouth daily with breakfast.   Yes Historical Provider, MD  XARELTO 15 MG TABS tablet TAKE 1 TABLET (15 MG TOTAL) BY MOUTH DAILY WITH SUPPER. 02/08/15  Yes Sherren Mocha, MD  cefpodoxime (VANTIN) 200 MG tablet Take 1 tablet (200 mg total) by mouth 2 (two) times daily. Stop date 03/03/2015 Patient not taking: Reported on 03/04/2015 02/25/15   Thurnell Lose, MD  furosemide (LASIX) 20 MG tablet Take 10 mg by mouth daily. HOLD if SBP<110    Historical Provider, MD  metoprolol succinate (TOPROL-XL) 50 MG 24 hr tablet TAKE 1 TABLET BY MOUTH EVERY MORNING & 1/2 TABLET EVERY EVENING WITH OR IMMEDIATELY FOLLOWING A MEAL Patient not taking: Reported on 03/04/2015 09/28/14   Sherren Mocha, MD   Allergies  Allergen Reactions  . Oxybutynin Anaphylaxis  . Latex Itching and Rash    Review of Systems Endorses fatigue and dyspnea. Otehrwise 10pt ROS negative.  Physical Exam  General: Chronically ill appearing, elderly male  Neuro: Awake, alert, hard of hearing.  HEENT: No JVD, MMM  Cardiovascular: Regular irreg; no mrg Lungs: Crackles bases + accessory  muscle use  Abdomen: soft + bowel sounds   Musculoskeletal: Generalized weakness. Equal st and bulk  Skin: Intact warm and dry   Vital Signs: BP 94/56 mmHg  Pulse 91  Temp(Src) 97.4 F (36.3 C) (Axillary)  Resp 22  Ht _0  (1.651 m)  Wt 71.9 kg (158 lb 8.2 oz)  BMI 26.38 kg/m2  SpO2 97%  SpO2: SpO2: 97 % O2 Device:SpO2: 97 % O2 Flow Rate: .O2 Flow Rate (L/min): 15 L/min  IO: Intake/output summary:  Intake/Output Summary (Last 24 hours) at 03/08/15 1832 Last data filed at 03/08/15 1701  Gross per 24 hour  Intake    844 ml  Output   2125 ml  Net  -1281 ml    LBM: Last BM Date: 03/06/15 Baseline Weight: Weight: 74.6 kg (164 lb 7.4 oz) Most recent weight: Weight: 71.9 kg (158 lb 8.2 oz)      Palliative Assessment/Data:  Flowsheet Rows        Most Recent Value   Intake Tab    Referral Department  Hospitalist   Unit at Time of Referral  Cardiac/Telemetry Unit   Palliative Care Primary Diagnosis  Pulmonary   Date Notified  03/06/15   Palliative Care Type  New Palliative care   Reason for referral  Clarify Goals of Care   Date of Admission  03/04/15   Date first seen by Palliative Care  03/08/15   # of days Palliative referral response time  2 Day(s)   # of days IP prior to Palliative referral  2   Clinical Assessment    Palliative Performance Scale Score  40%   Pain Max last 24 hours  3   Pain Min Last 24 hours  0   Psychosocial & Spiritual Assessment    Palliative Care Outcomes    Patient/Family meeting held?  Yes   Who was at the meeting?  Patient, wife   Palliative Care Outcomes  Clarified goals of care      Additional Data Reviewed:  CBC:    Component Value Date/Time   WBC 13.1* 03/08/2015 0538   HGB 10.0* 03/08/2015 0538   HCT 31.1* 03/08/2015 0538   PLT 358 03/08/2015 0538   MCV 101.0* 03/08/2015 0538   NEUTROABS 7.9* 03/05/2015 0147   LYMPHSABS 1.8 03/05/2015 0147   MONOABS 1.1* 03/05/2015 0147   EOSABS 0.4 03/05/2015 0147   BASOSABS 0.0 03/05/2015 0147   Comprehensive Metabolic  Panel:    Component Value Date/Time   NA 130* 03/08/2015 0538   K 5.1 03/08/2015 0538   CL 98* 03/08/2015 0538   CO2 23 03/08/2015 0538   BUN 41* 03/08/2015 0538   CREATININE 2.14* 03/08/2015 0538   GLUCOSE 94 03/08/2015 0538   CALCIUM 8.4* 03/08/2015 0538   AST 43* 02/17/2015 1553   ALT 38 02/17/2015 1553   ALKPHOS 216* 02/17/2015 1553   BILITOT 1.0 02/17/2015 1553   PROT 7.3 02/17/2015 1553   ALBUMIN 3.4* 02/17/2015 1553     Time In: 1410 Time Out: 1525 Time Total: 75 Greater than 50%  of this time was spent counseling and coordinating care related to the above assessment and plan.  Signed by: Micheline Rough, MD  Micheline Rough, MD  03/08/2015, 6:32 PM  Please contact Palliative Medicine Team phone at [phone] for questions and concerns.

## 2015-03-08 NOTE — Progress Notes (Signed)
Called [facility] pharmacy and spoke with Claiborne Billings, inquired about pt history with Macrobid per Dr Anastasia Pall request, records show fill dates in 2016 of 7/19, 7/25, 8/8, 9/16, 10/20, 10/25. States that is as far back as system will let her look it up.

## 2015-03-08 NOTE — Care Management Important Message (Signed)
Important Message Important Message  Patient Details IM Letter given to Kathy/Case Manager to present to Patient Name: Harold Jennings MRN: [id number] Date of Birth: February 16, 1932   Medicare Important Message Given:  Yes    Camillo Flaming 03/08/2015, 2:49 PM Patient Details  Name: Harold Jennings MRN: [id number] Date of Birth: 03-03-1932   Medicare Important Message Given:  Yes    Camillo Flaming 03/08/2015, 2:48 PM

## 2015-03-08 NOTE — Consult Note (Signed)
Name: Harold Jennings MRN: [id number] DOB: 1932-05-14    ADMISSION DATE:  03/04/2015 CONSULTATION DATE:  2/20  REFERRING MD :  Doyle Askew   CHIEF COMPLAINT:  Hypoxia   BRIEF PATIENT DESCRIPTION:  82 year old male admitted from SNF w/ working dx of decompensated HF and HCAp on 2/16. PCCM asked to see on 2/20 as he continued to have high O2 needs   SIGNIFICANT EVENTS    STUDIES:  U strep and legionella antigen 2/17: neg  Serial PCTs from admit to 2/20: negative  Sed rate 2/20>>> CT chest 2/18: CT scan of the abdomen pelvis shows mild interstitial change at the lung bases. Today's study shows much more pronounced interstitial opacification, along with small bilateral pleural effusions. There is cardio megaly with mild venous congestion. This study confirms significant acute interstitial process superimposed on mild chronic interstitial lung disease. The findings would suggest the possibility of congestive heart failure with interstitial edema, but clinical worsening despite diuresis indicates that the interstitial infiltrates seen bilaterally may be due to other etiologies including atypical pneumonia. HISTORY OF PRESENT ILLNESS:   82 year old male on xarelto for AF, chronic systolic HF w EF [id number], recently hospitalized from 2/2 to 2/9 w/ working dx of CAP c/b pulmonary edema and treated w/ oxygen and abx. Clinically improved during the hospital stay and was ultimately discharged to SNF on 4 liters/ [place]. Following d/c he had been slowly declining w/ poor activity tolerance and inability to tolerate PT. On 2/16 he was sent back to the hospital from the SNF as his O2 levels were in the 80s on 4 liters [place]. On presentation his primary complaint was fatigue, and malaise, but denied cough, fever or chills. He was admitted w/ a working dx of decompensated heart failure +/- HCAP. Since admission he has continued to require high flow mask oxygen at 100% in spite of aggressive diuresis (>8 liters neg since  admit) and empiric abx. A CT of chest was obtained on 2/18 which showed diffuse interstitial changes raising the question of atypical infection so empiric doxy was added. PCCM was asked to see on 2/20 as he continues to have very high FIO2 requirements.    PAST MEDICAL HISTORY :   has a past medical history of Dyslipidemia; HTN (hypertension); Obesity; Increased liver enzymes; Dyspnea; Ischemic cardiomyopathy; Spinal stenosis; Sleep apnea; CAD (coronary artery disease); Dysrhythmia; Edema; Cancer ([place]); Anemia; Diverticular disease (dec 2015); Umbilical hernia; Myocardial infarction [facility]); and CHF (congestive heart failure) ([place]).  has past surgical history that includes Total hip arthroplasty; Eye surgery; lower back surgery; Shoulder surgery; Cardioversion (N/A, 06/06/2012); Cardiac catheterization; Partial colectomy (N/A, 06/12/2014); Umbilical hernia repair (N/A, 06/12/2014); and Cystostomy (N/A, 06/12/2014). Prior to Admission medications   Medication Sig Start Date End Date Taking? Authorizing Provider  aspirin EC 81 MG EC tablet Take 1 tablet (81 mg total) by mouth daily. 02/25/15  Yes Thurnell Lose, MD  ezetimibe (ZETIA) 10 MG tablet Take 10 mg by mouth every morning.    Yes Historical Provider, MD  finasteride (PROSCAR) 5 MG tablet Take 5 mg by mouth daily.   Yes Historical Provider, MD  levothyroxine (SYNTHROID, LEVOTHROID) 125 MCG tablet Take 1 tablet (125 mcg total) by mouth daily before breakfast. 02/25/15  Yes Thurnell Lose, MD  loperamide (IMODIUM) 1 MG/5ML solution Take 10 mLs (2 mg total) by mouth every 6 (six) hours as needed for diarrhea or loose stools. 01/18/14  Yes Robbie Lis, MD  metoprolol succinate (TOPROL-XL)  25 MG 24 hr tablet Take 25 mg by mouth 2 (two) times daily.   Yes Historical Provider, MD  midodrine (PROAMATINE) 10 MG tablet Take 1 tablet (10 mg total) by mouth 3 (three) times daily with meals. 02/25/15  Yes Thurnell Lose, MD  nystatin-triamcinolone (MYCOLOG II)  cream Apply 1 application topically 3 (three) times daily. Apply to penis scrotum buttock and thighs every shift for rash   Yes Historical Provider, MD  phenazopyridine (PYRIDIUM) 200 MG tablet Take 200 mg by mouth 3 (three) times daily with meals.   Yes Historical Provider, MD  saccharomyces boulardii (FLORASTOR) 250 MG capsule Take 250 mg by mouth every morning.    Yes Historical Provider, MD  sertraline (ZOLOFT) 50 MG tablet Take 50 mg by mouth daily.   Yes Historical Provider, MD  silodosin (RAPAFLO) 8 MG CAPS capsule Take 8 mg by mouth daily with breakfast.   Yes Historical Provider, MD  XARELTO 15 MG TABS tablet TAKE 1 TABLET (15 MG TOTAL) BY MOUTH DAILY WITH SUPPER. 02/08/15  Yes Sherren Mocha, MD  cefpodoxime (VANTIN) 200 MG tablet Take 1 tablet (200 mg total) by mouth 2 (two) times daily. Stop date 03/03/2015 Patient not taking: Reported on 03/04/2015 02/25/15   Thurnell Lose, MD  furosemide (LASIX) 20 MG tablet Take 10 mg by mouth daily. HOLD if SBP<110    Historical Provider, MD  metoprolol succinate (TOPROL-XL) 50 MG 24 hr tablet TAKE 1 TABLET BY MOUTH EVERY MORNING & 1/2 TABLET EVERY EVENING WITH OR IMMEDIATELY FOLLOWING A MEAL Patient not taking: Reported on 03/04/2015 09/28/14   Sherren Mocha, MD   Allergies  Allergen Reactions  . Oxybutynin Anaphylaxis  . Latex Itching and Rash    FAMILY HISTORY:  family history includes Diabetes in his father; Drug abuse in his mother; Heart attack in his father; Hypertension in his brother, brother, brother, father, and sister; Stroke in his father. SOCIAL HISTORY:  reports that he has never smoked. He has never used smokeless tobacco. He reports that he drinks alcohol. He reports that he does not use illicit drugs. Moved from [place] No pets No dusts No environmental exposures   REVIEW OF SYSTEMS:   Constitutional: Negative for fever, chills, weight loss, malaise/fatigue and diaphoresis.  HENT: Negative for hearing loss, ear pain,  nosebleeds, congestion, sore throat, neck pain, tinnitus and ear discharge.   Eyes: Negative for blurred vision, double vision, photophobia, pain, discharge and redness.  Respiratory: Negative for cough, hemoptysis, sputum production, shortness of breath, wheezing and stridor.   Cardiovascular: Negative for chest pain, palpitations, orthopnea, claudication, leg swelling and PND.  Gastrointestinal: Negative for heartburn, nausea, vomiting, abdominal pain, diarrhea, constipation, blood in stool and melena.  Genitourinary: Negative for dysuria, urgency, frequency, hematuria and flank pain.  Musculoskeletal: Negative for myalgias, back pain, joint pain and falls.  Skin: Negative for itching and rash.  Neurological: Negative for dizziness, tingling, tremors, sensory change, speech change, focal weakness, seizures, loss of consciousness, weakness and headaches.  Endo/Heme/Allergies: Negative for environmental allergies and polydipsia. Does not bruise/bleed easily.  SUBJECTIVE:  Marked SOB at rest   VITAL SIGNS: Temp:  [97.4 F (36.3 C)-97.5 F (36.4 C)] 97.4 F (36.3 C) (02/20 1341) Pulse Rate:  [86-101] 91 (02/20 1341) Resp:  [20-22] 22 (02/20 1341) BP: (94-118)/(56-71) 94/56 mmHg (02/20 1341) SpO2:  [95 %-97 %] 97 % (02/20 1341) Weight:  [158 lb 8.2 oz (71.9 kg)] 158 lb 8.2 oz (71.9 kg) (02/20 0610) 100% NRB PHYSICAL EXAMINATION: General:  Chronically ill appearing  Neuro:  Awake, alert, hard of hearing. Generalized weakness but no focal def  HEENT:  No JVD MMM   Cardiovascular:  Regular irreg; no mrg Lungs:  Crackles bases + accessory muscle use  Abdomen: soft + bowel sounds  Musculoskeletal:  Generalized weakness. Equal st and bulk  Skin:  Intact warm and dry    Recent Labs Lab 03/06/15 0553 03/07/15 0609 03/08/15 0538  NA 136 131* 130*  K 4.2 4.8 5.1  CL 105 100* 98*  CO2 22 23 23   BUN 34* 40* 41*  CREATININE 1.95* 2.10* 2.14*  GLUCOSE 115* 88 94    Recent Labs Lab  03/06/15 0553 03/07/15 0609 03/08/15 0538  HGB 9.8* 9.6* 10.0*  HCT 31.0* 29.8* 31.1*  WBC 15.9* 15.0* 13.1*  PLT 364 328 358   Ct Chest Wo Contrast  03/06/2015  CLINICAL DATA:  Acute respiratory failure, personal history of atrial fibrillation, dyspnea becoming worse despite diuresis EXAM: CT CHEST WITHOUT CONTRAST TECHNIQUE: Multidetector CT imaging of the chest was performed following the standard protocol without IV contrast. COMPARISON:  03/04/2015, 04/22/2014 FINDINGS: Mediastinum/Nodes: There is moderate cardiac enlargement. There is 3 vessel coronary artery calcification. There is moderate thoracic aorta calcification without dilatation. No significant pericardial effusion. Precarinal lymph node measures 11 mm. Other mediastinal lymph nodes are noted at borderline size. Lungs/Pleura: There is severe diffuse interstitial change he, relatively sparing the lung apices and involving most prominently the mid to lower lung zones. There are small bilateral pleural effusions. There is mild venous congestion. Upper abdomen: Partially visualized numerous gallstones. Musculoskeletal: No acute findings IMPRESSION: 04/22/2014 CT scan of the abdomen pelvis shows mild interstitial change at the lung bases. Today's study shows much more pronounced interstitial opacification, along with small bilateral pleural effusions. There is cardio megaly with mild venous congestion. This study confirms significant acute interstitial process superimposed on mild chronic interstitial lung disease. The findings would suggest the possibility of congestive heart failure with interstitial edema, but clinical worsening despite diuresis indicates that the interstitial infiltrates seen bilaterally may be due to other etiologies including atypical pneumonia. Electronically Signed   By: Skipper Cliche M.D.   On: 03/06/2015 17:20    ASSESSMENT / PLAN:  Acute Hypoxic Respiratory failure in the setting of diffuse pulmonary  infiltrates.  D-dx: infectious vs inflammatory/pneumonitis vs ALI. There may be an edema component but don't think that this is the primary issue. He does not appear toxic. Would favor some sort of pneumonitis vs ALI.   Plan Cont current abx Ck sed rate Send ANCA titer and RF.  Cont lasix as BP/BUn and cr allow Will consider steroid trial as he does not look like he could tolerate FOB or OLB  afib CHF (Chronic systolic HF) now compensated Demand ischemia  Plan Cont rate control and xarelto  Cont lasix   All other issues per [place] ACNP-BC [place] Pager # [phone] OR # [phone] if no answer   03/08/2015, 2:10 PM

## 2015-03-08 NOTE — Progress Notes (Signed)
Patient ID: Harold Jennings, male   DOB: 01/25/1932, 82 y.o.   MRN: [id number]  [facility] PROGRESS NOTE  Harold Jennings [id number] DOB: 1932-06-13 DOA: 03/04/2015 PCP: London Pepper, MD   Brief narrative:    82 y.o. male with PMH of chronic atrial fibrillation on Xarelto, chronic systolic CHF (EF [id number]), hypertension, and lumbar spinal stenosis who presented from his SNF for evaluation of acute hypoxic respiratory failure. Patient was admitted to [facility] from 02/17/2015 - 02/25/2015, during which time he was aggressively diuresed and treated empirically for CAP with azithromycin and Rocephin. His symptoms have gotten worse in the past 24 hours, was dyspneic at rest and with minimal exertional.  In ED, patient was found to be afebrile, saturating in the 80s on 4 L/m supplemental oxygen, and with remaining vital signs stable. A chest x-ray demonstrated diffuse bilateral patchy opacities concerning for pneumonia, with edema less likely. EKG featured atrial fibrillation with right bundle branch block and borderline ST depression in leads V4 to V6. Chem panel notable for a mild hyponatremia, serum bicarbonate of 19, and serum creatinine of 1.79 which is up from his baseline of 1, but improving from time of recent discharge. BNP returned elevated to 1500 and troponin is also elevated to 0.75. CBC is unremarkable. In the emergency department, the patient was given a 40 mg IV push of Lasix and treated with empiric vancomycin and cefepime. His O2 sat duration and improved to the 90s on oxi-mask, he remained hemodynamically stable.  Major events since admission: 2/18 - still on NRB, unable to wean off, CT chest requested  2/19 - added doxycycline for atypical coverage, also added Lasix 40 mg in addition to QD dosing for better diuresis    Assessment/Plan:    1. Acute on chronic hypoxic respiratory failure  - Multifactorial with contributions from acute on chronic systolic CHF and HCAP,  demand ischemia  - Discharged 1 wk ago on 4 Lpm, but sat persisting <90% at rest at SNF, improved with 10 Lpm via NRB - still requiring NRB and unable to taper down - CT chest requested for further evaluation, persistent cardiomegaly with pulmonary vascular congestion and ? Atypical PNA - stoppedvancomycin and added doxycycline to maxipime 2/19 - consulted PCCM for further assistance   2. Acute on chronic systolic CHF  - TTE (123456) EF 40-45%, mild LVH, severe LA dilation, mild-moderately elevated PA pressures  - BNP 1500 on arrival, was 820 on 02/17/15  - Diuresed with IV Lasix, transition to PO Lasix  - weight dwon since admission 164 lbs --> 158 lbs this AM - monitor strict I/Os, daily wts, SLIV, fluid-restrict diet  - Midodrine 10 mg TID for BP support during diuresis  - Continue ASA 81, Zetia, resume beta-blocker  - has been intolerant to statins and ACE/ARB precluded by CKD - cardiologist consulted   3. HCAP, diffuse, bilateral lobes  - CXR findings c/w PNA and per CT chest ? Atypical PNA - continue cefepime day #5, doxycyclne day #2 - follow up on strep pneumo antigens, sputum for GS and cx  - PCCM consult   4. Acute kidney injury  - SCr 1.8 on arrival, up from an apparent baseline of 1 - continue with diuresis but change to PO and see if this will help with Cr  - Avoid nephrotoxins where feasible  - BMP In AM  5. Chronic atrial fibrillation  - LA severely dilated on TTE, suggesting that this will be permanent  - Continue AC  with Xarelto  - Rate-controlled effectively with Toprol XL, lower the dose of Toprol due to lower BP  6. Hyponatremia  - Likely secondary to CHF and the associated relative arterial blood volume depletion (and subsequent increase in ADH production)  - BMP in AM  7. Hypothyroidism - Continue current home-dose Synthroid   8. BPH  - Continue Flomax, finasteride  - Foley in place currently, will d/c as able   9. Elevated cardiac  biomarker - Troponin I elevated to 0.75 on admission, repeat troponin relatively unchanged - Likely secondary to acute CHF; pt denies CP, palpitations, nausea - pt very clear he does not want any invasive interventions, confirms DNR code status   DVT prophylaxis - on full AC Xarelto   Code Status: DNR Family Communication:  plan of care discussed with the patient and wife at bedside  Disposition Plan: SNF by 2/22  IV access:  Peripheral IV  Procedures and diagnostic studies:    Dg Chest 2 View 03/04/2015 Diffuse lung opacity with patchy appearance favoring pneumonia over edema. 2. Suspect underlying interstitial lung disease.  Medical Consultants:  PCCM PCT Cardiology   Other Consultants:  None  IAnti-Infectives:   Vancomycin 2/16 --> 2/19 Maxipime 2/16 --> Doxycycline 2/19 -->  [name], MD  [facility] Pager [phone]  If 7PM-7AM, please contact night-coverage [email or web address] Password [email or web address] 03/08/2015, 3:05 PM   LOS: 4 days   HPI/Subjective: No events overnight. Still concerned with dyspnea at rest and with minimal exertion.   Objective: Filed Vitals:   03/07/15 2011 03/08/15 0610 03/08/15 1011 03/08/15 1341  BP: 106/65 108/64 118/71 94/56  Pulse: 90 86 101 91  Temp: 97.5 F (36.4 C) 97.4 F (36.3 C)  97.4 F (36.3 C)  TempSrc: Axillary Axillary  Axillary  Resp: 20 22  22   Height:      Weight:  71.9 kg (158 lb 8.2 oz)    SpO2: 96% 95%  97%    Intake/Output Summary (Last 24 hours) at 03/08/15 1505 Last data filed at 03/08/15 1345  Gross per 24 hour  Intake    540 ml  Output   2125 ml  Net  -1585 ml    Exam:   General:  Pt is alert, follows commands appropriately, not in acute distress  Cardiovascular: Irregular rate and rhythm, no rubs, no gallops  Respiratory: Course breath sounds with rhonchi bilaterally, mild tachypnea and RR in mid 20's  Abdomen: Soft, non tender, non distended, bowel sounds present, no guarding  Extremities: pulses DP  and PT palpable bilaterally  Neuro: Grossly nonfocal  Data Reviewed: Basic Metabolic Panel:  Recent Labs Lab 03/04/15 2007 03/05/15 0147 03/06/15 0553 03/07/15 0609 03/08/15 0538  NA 134* 136 136 131* 130*  K 3.9 3.9 4.2 4.8 5.1  CL 107 105 105 100* 98*  CO2 19* 22 22 23 23   GLUCOSE 105* 116* 115* 88 94  BUN 30* 29* 34* 40* 41*  CREATININE 1.79* 1.69* 1.95* 2.10* 2.14*  CALCIUM 8.0* 8.1* 8.3* 8.1* 8.4*  MG  --   --  1.8  --   --    CBC:  Recent Labs Lab 03/04/15 2007 03/05/15 0147 03/06/15 0553 03/07/15 0609 03/08/15 0538  WBC 7.0 11.3* 15.9* 15.0* 13.1*  NEUTROABS  --  7.9*  --   --   --   HGB 14.3 9.8* 9.8* 9.6* 10.0*  HCT 43.1 29.8* 31.0* 29.8* 31.1*  MCV 97.7 100.3* 101.3* 100.7* 101.0*  PLT 250 376 364 328 358  Recent Results (from the past 240 hour(s))  MRSA PCR Screening     Status: None   Collection Time: 03/05/15  9:39 AM  Result Value Ref Range Status   MRSA by PCR NEGATIVE NEGATIVE Final    Comment:        The GeneXpert MRSA Assay (FDA approved for NASAL specimens only), is one component of a comprehensive MRSA colonization surveillance program. It is not intended to diagnose MRSA infection nor to guide or monitor treatment for MRSA infections.      Scheduled Meds: . aspirin EC  81 mg Oral Daily  . ceFEPime (MAXIPIME) IV  1 g Intravenous Q24H  . doxycycline (VIBRAMYCIN) IV  100 mg Intravenous Q12H  . ezetimibe  10 mg Oral q morning - 10a  . feeding supplement (ENSURE ENLIVE)  237 mL Oral BID BM  . finasteride  5 mg Oral Daily  . furosemide  40 mg Oral Daily  . levothyroxine  125 mcg Oral QAC breakfast  . metoprolol succinate  12.5 mg Oral BID  . midodrine  10 mg Oral TID WC  . nystatin-triamcinolone  1 application Topical TID  . potassium chloride  20 mEq Oral BID  . Rivaroxaban  15 mg Oral q1800  . saccharomyces boulardii  250 mg Oral q morning - 10a  . sertraline  50 mg Oral Daily  . sodium chloride flush  3 mL Intravenous Q12H   . tamsulosin  0.4 mg Oral Daily   Continuous Infusions:

## 2015-03-08 NOTE — Consult Note (Signed)
CARDIOLOGY CONSULT NOTE   Patient ID: Harold Jennings MRN: [id number] DOB/AGE: 80-Jun-1934 80 y.o.  Admit date: 03/04/2015  Primary Physician   London Pepper, MD Primary Cardiologist  Dr. Sherren Mocha Reason for Consultation   Positive troponin and CHF  HPI: Harold Jennings is a 82 year old male with a past medical history of HTN, ICM, CAD with cath in 2000 showing total mid RCA with collaterals, and chronic Afib (on Xarelto).  Recently was admitted from 02/17/15-02/25/15 for CAP and respiratory failure.    He lives at [facility] and was brought to ED on 03/04/15 for hypoxia and found to have an O2 sat of 87 on 4L.  Patient was discharged to SNF on O2, and has had troubles keeping sats above 90%.  Chest x-ray demonstrated diffuse bilateral opacities, concerning for PNA.  Patient required NRB to maintain sats.  Troponin positive at 0.75, patient denies chest pain.    He has been treated with IV Vanc and cefepime. Receiving 40mg  IV Lasix BId and is negative 1.9 liters.  Weight is down 6 pounds since admission.      Past Medical History  Diagnosis Date  . Dyslipidemia   . HTN (hypertension)   . Obesity   . Increased liver enzymes     felt to be a fatty liver per ultrasound in 2011  . Dyspnea   . Ischemic cardiomyopathy   . Spinal stenosis   . Sleep apnea     bipap   . CAD (coronary artery disease)     Silent MI sometime prior to 2000. S/P cath in 2000 showing a total mid RCA with collaterals from the LCX. EF is 40 to50%; Last Myoview in 2010 showing inferior scar without ischemia. EF was 52%.  Marland Kitchen Dysrhythmia     atrial fib   . Edema     lower extremity   . Cancer ([place])     skin cancer   . Anemia   . Diverticular disease dec 2015  . Umbilical hernia   . Myocardial infarction ([place])   . CHF (congestive heart failure) [facility])      Past Surgical History  Procedure Laterality Date  . Total hip arthroplasty    . Eye surgery    . Lower back surgery      spinal stenosis  . Shoulder  surgery    . Cardioversion N/A 06/06/2012    Procedure: CARDIOVERSION;  Surgeon: Thayer Headings, MD;  Location: [facility];  Service: Cardiovascular;  Laterality: N/A;  . Cardiac catheterization    . Partial colectomy N/A 06/12/2014    Procedure: SIGMOID COLECTOMY AND TAKEDOWN OF COLOVESICAL FISTULA ;  Surgeon: Armandina Gemma, MD;  Location: [facility];  Service: General;  Laterality: N/A;  . Umbilical hernia repair N/A 06/12/2014    Procedure: INCARCERATED UMBILICAL HERNIA REPAIR  ;  Surgeon: Armandina Gemma, MD;  Location: [facility];  Service: General;  Laterality: N/A;  . Cystostomy N/A 06/12/2014    Procedure: Jasmine December;  Surgeon: Franchot Gallo, MD;  Location: [facility];  Service: Urology;  Laterality: N/A;    Allergies  Allergen Reactions  . Oxybutynin Anaphylaxis  . Latex Itching and Rash    I have reviewed the patient's current medications . aspirin EC  81 mg Oral Daily  . ceFEPime (MAXIPIME) IV  1 g Intravenous Q24H  . doxycycline (VIBRAMYCIN) IV  100 mg Intravenous Q12H  . ezetimibe  10 mg Oral q morning - 10a  . feeding supplement (ENSURE  ENLIVE)  237 mL Oral BID BM  . finasteride  5 mg Oral Daily  . furosemide  40 mg Intravenous BID  . levothyroxine  125 mcg Oral QAC breakfast  . metoprolol succinate  12.5 mg Oral BID  . midodrine  10 mg Oral TID WC  . nystatin-triamcinolone  1 application Topical TID  . potassium chloride  20 mEq Oral BID  . Rivaroxaban  15 mg Oral q1800  . saccharomyces boulardii  250 mg Oral q morning - 10a  . sertraline  50 mg Oral Daily  . sodium chloride flush  3 mL Intravenous Q12H  . tamsulosin  0.4 mg Oral Daily     sodium chloride, acetaminophen, levalbuterol, lip balm, loperamide, morphine CONCENTRATE, ondansetron (ZOFRAN) IV, sodium chloride flush  Prior to Admission medications   Medication Sig Start Date End Date Taking? Authorizing Provider  aspirin EC 81 MG EC tablet Take 1 tablet (81 mg total) by mouth daily. 02/25/15  Yes Thurnell Lose, MD   ezetimibe (ZETIA) 10 MG tablet Take 10 mg by mouth every morning.    Yes Historical Provider, MD  finasteride (PROSCAR) 5 MG tablet Take 5 mg by mouth daily.   Yes Historical Provider, MD  levothyroxine (SYNTHROID, LEVOTHROID) 125 MCG tablet Take 1 tablet (125 mcg total) by mouth daily before breakfast. 02/25/15  Yes Thurnell Lose, MD  loperamide (IMODIUM) 1 MG/5ML solution Take 10 mLs (2 mg total) by mouth every 6 (six) hours as needed for diarrhea or loose stools. 01/18/14  Yes Robbie Lis, MD  metoprolol succinate (TOPROL-XL) 25 MG 24 hr tablet Take 25 mg by mouth 2 (two) times daily.   Yes Historical Provider, MD  midodrine (PROAMATINE) 10 MG tablet Take 1 tablet (10 mg total) by mouth 3 (three) times daily with meals. 02/25/15  Yes Thurnell Lose, MD  nystatin-triamcinolone (MYCOLOG II) cream Apply 1 application topically 3 (three) times daily. Apply to penis scrotum buttock and thighs every shift for rash   Yes Historical Provider, MD  phenazopyridine (PYRIDIUM) 200 MG tablet Take 200 mg by mouth 3 (three) times daily with meals.   Yes Historical Provider, MD  saccharomyces boulardii (FLORASTOR) 250 MG capsule Take 250 mg by mouth every morning.    Yes Historical Provider, MD  sertraline (ZOLOFT) 50 MG tablet Take 50 mg by mouth daily.   Yes Historical Provider, MD  silodosin (RAPAFLO) 8 MG CAPS capsule Take 8 mg by mouth daily with breakfast.   Yes Historical Provider, MD  XARELTO 15 MG TABS tablet TAKE 1 TABLET (15 MG TOTAL) BY MOUTH DAILY WITH SUPPER. 02/08/15  Yes Sherren Mocha, MD  cefpodoxime (VANTIN) 200 MG tablet Take 1 tablet (200 mg total) by mouth 2 (two) times daily. Stop date 03/03/2015 Patient not taking: Reported on 03/04/2015 02/25/15   Thurnell Lose, MD  furosemide (LASIX) 20 MG tablet Take 10 mg by mouth daily. HOLD if SBP<110    Historical Provider, MD  metoprolol succinate (TOPROL-XL) 50 MG 24 hr tablet TAKE 1 TABLET BY MOUTH EVERY MORNING & 1/2 TABLET EVERY EVENING WITH  OR IMMEDIATELY FOLLOWING A MEAL Patient not taking: Reported on 03/04/2015 09/28/14   Sherren Mocha, MD     Social History   Social History  . Marital Status: Married    Spouse Name: N/A  . Number of Children: 2  . Years of Education: N/A   Occupational History  . retired    Social History Main Topics  . Smoking status:  Never Smoker   . Smokeless tobacco: Never Used  . Alcohol Use: Yes     Comment: used to take 2 glasses of wine daily, but has not for a while   . Drug Use: No  . Sexual Activity: No   Other Topics Concern  . Not on file   Social History Narrative    Family Status  Relation Status Death Age  . Father Deceased   . Mother Deceased   . Sister Deceased   . Brother Alive   . Brother Alive   . Brother Alive   . Sister Alive    Family History  Problem Relation Age of Onset  . Stroke Father   . Heart attack Father   . Hypertension Father   . Diabetes Father   . Drug abuse Mother   . Hypertension Brother   . Hypertension Brother   . Hypertension Brother   . Hypertension Sister      ROS:  Full 14 point review of systems complete and found to be negative unless listed above.  Physical Exam: Blood pressure 118/71, pulse 101, temperature 97.4 F (36.3 C), temperature source Axillary, resp. rate 22, height 5\' 5"  (1.651 m), weight 158 lb 8.2 oz (71.9 kg), SpO2 95 %.  General: Frail elderly male in no acute distress Head: Eyes PERRLA, No xanthomas.   Normocephalic and atraumatic, oropharynx without edema or exudate. Dentition: good.  Lungs: Bilateral upper lobes diminished, lower lobes diminished with fine rales.  Heart: Heart irregular rate and rhythm with S1, S2  No murmur. Pulses are 2+ extrem.   Neck: No carotid bruits. No lymphadenopathy.   Abdomen: Bowel sounds present, abdomen soft and non-tender without masses or hernias noted. Msk:  No spine or cva tenderness. No weakness, no joint deformities or effusions. Extremities: No clubbing or cyanosis.   +1 pedal edema.  Neuro: Alert and oriented X 3. No focal deficits noted. Psych:  Good affect, responds appropriately Skin: No rashes or lesions noted.  Labs:   Lab Results  Component Value Date   WBC 13.1* 03/08/2015   HGB 10.0* 03/08/2015   HCT 31.1* 03/08/2015   MCV 101.0* 03/08/2015   PLT 358 03/08/2015   No results for input(s): INR in the last 72 hours.  Recent Labs Lab 03/08/15 0538  NA 130*  K 5.1  CL 98*  CO2 23  BUN 41*  CREATININE 2.14*  CALCIUM 8.4*  GLUCOSE 94   MAGNESIUM  Date Value Ref Range Status  03/06/2015 1.8 1.7 - 2.4 mg/dL Final    Recent Labs  03/05/15 1731  TROPONINI 0.83*   No results for input(s): TROPIPOC in the last 72 hours. B NATRIURETIC PEPTIDE  Date/Time Value Ref Range Status  03/05/2015 01:47 AM 1236.3* 0.0 - 100.0 pg/mL Final  03/04/2015 08:07 PM 1506.6* 0.0 - 100.0 pg/mL Final   Lab Results  Component Value Date   CHOL 101 02/18/2015   HDL 34* 02/18/2015   LDLCALC 56 02/18/2015   TRIG 55 02/18/2015   No results found for: DDIMER LIPASE  Date/Time Value Ref Range Status  07/12/2014 01:35 PM 64* 22 - 51 U/L Final   TSH  Date/Time Value Ref Range Status  02/24/2015 11:44 AM 8.237* 0.350 - 4.500 uIU/mL Final  01/04/2009 10:09 AM 4.94 0.35-5.50 microintl units/mL Final   No results found for: VITAMINB12, FOLATE, FERRITIN, TIBC, IRON, RETICCTPCT  Echo: February 2017  LV EF: 40-45% Left ventricle: The cavity size was mildly dilated. Wall thickness was increased in  a pattern of mild LVH. Systolic function was mildly to moderately reduced. The estimated ejection fraction was in the range of 40% to 45%. Moderate hypokinesis of the basal-midinferior myocardium.  ECG:  Afib with RBBB, ST depression noted   Radiology:  Ct Chest Wo Contrast  03/06/2015  CLINICAL DATA:  Acute respiratory failure, personal history of atrial fibrillation, dyspnea becoming worse despite diuresis EXAM: CT CHEST WITHOUT CONTRAST  TECHNIQUE: Multidetector CT imaging of the chest was performed following the standard protocol without IV contrast. COMPARISON:  03/04/2015, 04/22/2014 FINDINGS: Mediastinum/Nodes: There is moderate cardiac enlargement. There is 3 vessel coronary artery calcification. There is moderate thoracic aorta calcification without dilatation. No significant pericardial effusion. Precarinal lymph node measures 11 mm. Other mediastinal lymph nodes are noted at borderline size. Lungs/Pleura: There is severe diffuse interstitial change he, relatively sparing the lung apices and involving most prominently the mid to lower lung zones. There are small bilateral pleural effusions. There is mild venous congestion. Upper abdomen: Partially visualized numerous gallstones. Musculoskeletal: No acute findings IMPRESSION: 04/22/2014 CT scan of the abdomen pelvis shows mild interstitial change at the lung bases. Today's study shows much more pronounced interstitial opacification, along with small bilateral pleural effusions. There is cardio megaly with mild venous congestion. This study confirms significant acute interstitial process superimposed on mild chronic interstitial lung disease. The findings would suggest the possibility of congestive heart failure with interstitial edema, but clinical worsening despite diuresis indicates that the interstitial infiltrates seen bilaterally may be due to other etiologies including atypical pneumonia. Electronically Signed   By: Skipper Cliche M.D.   On: 03/06/2015 17:20    ASSESSMENT AND PLAN:    Principal Problem:   Acute respiratory failure with hypoxia (HCC) Active Problems:   Essential hypertension   Chronic atrial fibrillation (HCC)   ARF (acute renal failure) (HCC)   Hypothyroidism   Acute on chronic systolic CHF (congestive heart failure) (HCC)   HCAP (healthcare-associated pneumonia)   Hyponatremia   Pressure ulcer   Elevated troponin   Malnutrition of moderate degree  1.  CHF - Acute on Chronic  Can transition to PO lasix as patient does not appear volume overloaded on exam.  I suspect his SOB is related to his documented PNA by CT and X-Ray.  Weight below previous baseline.  Positive troponins likely due to demand ischemia. No ACE/ARB as patient has chronic renal insufficiency.  Continue 1549ml fluid restriction Continue daily weights.  Patient is DNR   2. Afib Chronic, rate controlled.   Continue Xarelto  3. Shortness of Breath No d-dimer as PE is not likely as patient has been on Xarelto with high compliance.   4. Elevated troponin Likely due to demand ischemia from chronically occluded RCA.  Can consider myoview if patient's condition improves.    Signed: Arbutus Leas, NP 03/08/2015 11:31 AM   Co-Sign MD  Attending Note:   The patient was seen and examined.  Agree with assessment and plan as noted above.  Changes made to the above note as needed.  1. CHF  :  Has known CHF  But I think this exacerbation is actually incompletely treated pneumonia.    He has decreased skin turgur,  His BUN and Cr are increasing as he is diuresed, the CXR and CT suggest pneumonia instead of pneumonia. I would hold lasix for now and follow   2. Elevated troponin:  I suspect this is due to demand ischemia.  Continue to cycle and will evaluate tomorrow. He has  severe respiratory issues and would be a poor candidate for cath / PCI at this time .  3.  afib - stable   Thayer Headings, Brooke Bonito., MD, [facility] 03/08/2015, 1:29 PM [address]  [place] Pager [phone]

## 2015-03-08 NOTE — Progress Notes (Signed)
PT Cancellation Note / Sign off  Patient Details Name: Harold Jennings MRN: [id number] DOB: 1932-09-18   Cancelled Treatment:    Reason Eval/Treat Not Completed: Patient not medically ready This is patient's 3rd medical cancel.  RN reports pt extremely SOB/dyspneic when attempting to wean from NRB.  Pt remains on NRB.  Please re-order if/when pt able to participate.  Pt from SNF with plans to return to SNF.   Symphoni Helbling,[name] 03/08/2015, 10:15 AM Carmelia Bake, PT, DPT 03/08/2015 Pager: [phone]

## 2015-03-09 DIAGNOSIS — J849 Interstitial pulmonary disease, unspecified: Secondary | ICD-10-CM | POA: Insufficient documentation

## 2015-03-09 DIAGNOSIS — I5023 Acute on chronic systolic (congestive) heart failure: Secondary | ICD-10-CM

## 2015-03-09 DIAGNOSIS — J9601 Acute respiratory failure with hypoxia: Secondary | ICD-10-CM | POA: Insufficient documentation

## 2015-03-09 DIAGNOSIS — R0600 Dyspnea, unspecified: Secondary | ICD-10-CM | POA: Insufficient documentation

## 2015-03-09 LAB — BASIC METABOLIC PANEL
ANION GAP: 9 (ref 5–15)
Anion gap: 11 (ref 5–15)
BUN: 50 mg/dL — AB (ref 6–20)
BUN: 54 mg/dL — ABNORMAL HIGH (ref 6–20)
CHLORIDE: 97 mmol/L — AB (ref 101–111)
CO2: 22 mmol/L (ref 22–32)
CO2: 22 mmol/L (ref 22–32)
CREATININE: 2.51 mg/dL — AB (ref 0.61–1.24)
Calcium: 9 mg/dL (ref 8.9–10.3)
Calcium: 9 mg/dL (ref 8.9–10.3)
Chloride: 97 mmol/L — ABNORMAL LOW (ref 101–111)
Creatinine, Ser: 2.47 mg/dL — ABNORMAL HIGH (ref 0.61–1.24)
GFR calc Af Amer: 27 mL/min — ABNORMAL LOW (ref >60)
GFR, EST AFRICAN AMERICAN: 26 mL/min — AB (ref >60)
GFR, EST NON AFRICAN AMERICAN: 23 mL/min — AB (ref >60)
GFR, EST NON AFRICAN AMERICAN: 23 mL/min — AB (ref >60)
Glucose, Bld: 177 mg/dL — ABNORMAL HIGH (ref 65–99)
Glucose, Bld: 217 mg/dL — ABNORMAL HIGH (ref 65–99)
POTASSIUM: 6 mmol/L — AB (ref 3.5–5.1)
Potassium: 6 mmol/L — ABNORMAL HIGH (ref 3.5–5.1)
SODIUM: 130 mmol/L — AB (ref 135–145)
Sodium: 128 mmol/L — ABNORMAL LOW (ref 135–145)

## 2015-03-09 LAB — MPO/PR-3 (ANCA) ANTIBODIES

## 2015-03-09 LAB — CBC
HCT: 29.9 % — ABNORMAL LOW (ref 39.0–52.0)
HEMOGLOBIN: 9.6 g/dL — AB (ref 13.0–17.0)
MCH: 32 pg (ref 26.0–34.0)
MCHC: 32.1 g/dL (ref 30.0–36.0)
MCV: 99.7 fL (ref 78.0–100.0)
PLATELETS: 346 10*3/uL (ref 150–400)
RBC: 3 MIL/uL — ABNORMAL LOW (ref 4.22–5.81)
RDW: 17.2 % — AB (ref 11.5–15.5)
WBC: 5.4 10*3/uL (ref 4.0–10.5)

## 2015-03-09 LAB — ANCA TITERS

## 2015-03-09 LAB — RHEUMATOID FACTOR: RHEUMATOID FACTOR: 24.7 [IU]/mL — AB (ref 0.0–13.9)

## 2015-03-09 MED ORDER — SODIUM POLYSTYRENE SULFONATE 15 GM/60ML PO SUSP
15.0000 g | Freq: Once | ORAL | Status: AC
  Administered 2015-03-09: 15 g via ORAL
  Filled 2015-03-09: qty 60

## 2015-03-09 NOTE — Progress Notes (Signed)
Daily Progress Note   Patient Name: Harold Jennings       Date: 03/09/2015 DOB: July 28, 1932  Age: 82 y.o. MRN#: [id number] Attending Physician: Theodis Blaze, MD Primary Care Physician: London Pepper, MD Admit Date: 03/04/2015  Reason for Consultation/Follow-up: Establishing goals of care  Subjective: Patient reports feeling "the same." He denies other needs at this time.  We talked about plan moving forward to continue with high-dose steroids in order to see if he has response to continued treatment.  He is agreeable to this but also notes that if he is not going to improve, he would like to try to get home with the support of hospice.  Length of Stay: 5 days  Current Medications: Scheduled Meds:  . aspirin EC  81 mg Oral Daily  . ceFEPime (MAXIPIME) IV  1 g Intravenous Q24H  . doxycycline (VIBRAMYCIN) IV  100 mg Intravenous Q12H  . ezetimibe  10 mg Oral q morning - 10a  . feeding supplement (ENSURE ENLIVE)  237 mL Oral BID BM  . finasteride  5 mg Oral Daily  . furosemide  40 mg Oral Daily  . levothyroxine  125 mcg Oral QAC breakfast  . methylPREDNISolone (SOLU-MEDROL) injection  500 mg Intravenous Q12H  . metoprolol succinate  12.5 mg Oral BID  . midodrine  10 mg Oral TID WC  . nystatin-triamcinolone  1 application Topical TID  . Rivaroxaban  15 mg Oral q1800  . saccharomyces boulardii  250 mg Oral q morning - 10a  . sertraline  50 mg Oral Daily  . sodium chloride flush  3 mL Intravenous Q12H  . tamsulosin  0.4 mg Oral Daily    Continuous Infusions:    PRN Meds: sodium chloride, acetaminophen, levalbuterol, lip balm, loperamide, morphine CONCENTRATE, ondansetron (ZOFRAN) IV, sodium chloride flush  Physical Exam: Physical Exam   General: Chronically ill appearing,  elderly male, on facemask on 15 L  Neuro: Awake, alert, hard of hearing.  HEENT: No JVD, MMM  Cardiovascular: Regular irreg; no mrg Lungs: Crackles bases + accessory muscle use  Abdomen: soft + bowel sounds  Musculoskeletal: Generalized weakness. Equal st and bulk  Skin: Intact warm and dry             Vital Signs: BP 108/75 mmHg  Pulse 63  Temp(Src) 97.4  F (36.3 C) (Oral)  Resp 20  Ht 5\' 5"  (1.651 m)  Wt 70.9 kg (156 lb 4.9 oz)  BMI 26.01 kg/m2  SpO2 99% SpO2: SpO2: 99 % O2 Device: O2 Device: NRB O2 Flow Rate: O2 Flow Rate (L/min): 15 L/min  Intake/output summary:  Intake/Output Summary (Last 24 hours) at 03/09/15 [id number] Last data filed at 03/09/15 [id number]  Gross per 24 hour  Intake    898 ml  Output   1175 ml  Net   -277 ml   LBM: Last BM Date: 03/06/15 Baseline Weight: Weight: 74.6 kg (164 lb 7.4 oz) Most recent weight: Weight: 70.9 kg (156 lb 4.9 oz)       Palliative Assessment/Data: Flowsheet Rows        Most Recent Value   Intake Tab    Referral Department  Hospitalist   Unit at Time of Referral  Cardiac/Telemetry Unit   Palliative Care Primary Diagnosis  Pulmonary   Date Notified  03/06/15   Palliative Care Type  New Palliative care   Reason for referral  Clarify Goals of Care   Date of Admission  03/04/15   Date first seen by Palliative Care  03/08/15   # of days Palliative referral response time  2 Day(s)   # of days IP prior to Palliative referral  2   Clinical Assessment    Palliative Performance Scale Score  40%   Pain Max last 24 hours  3   Pain Min Last 24 hours  0   Psychosocial & Spiritual Assessment    Palliative Care Outcomes    Patient/Family meeting held?  Yes   Who was at the meeting?  Patient, wife   Palliative Care Outcomes  Clarified goals of care      Additional Data Reviewed: CBC    Component Value Date/Time   WBC 5.4 03/09/2015 0536   RBC 3.00* 03/09/2015 0536   HGB 9.6* 03/09/2015 0536   HCT 29.9* 03/09/2015 0536    PLT 346 03/09/2015 0536   MCV 99.7 03/09/2015 0536   MCH 32.0 03/09/2015 0536   MCHC 32.1 03/09/2015 0536   RDW 17.2* 03/09/2015 0536   LYMPHSABS 1.8 03/05/2015 0147   MONOABS 1.1* 03/05/2015 0147   EOSABS 0.4 03/05/2015 0147   BASOSABS 0.0 03/05/2015 0147    CMP     Component Value Date/Time   NA 130* 03/09/2015 0536   K 6.0* 03/09/2015 0536   CL 97* 03/09/2015 0536   CO2 22 03/09/2015 0536   GLUCOSE 177* 03/09/2015 0536   BUN 50* 03/09/2015 0536   CREATININE 2.51* 03/09/2015 0536   CALCIUM 9.0 03/09/2015 0536   PROT 7.3 02/17/2015 1553   ALBUMIN 3.4* 02/17/2015 1553   AST 43* 02/17/2015 1553   ALT 38 02/17/2015 1553   ALKPHOS 216* 02/17/2015 1553   BILITOT 1.0 02/17/2015 1553   GFRNONAA 23* 03/09/2015 0536   GFRAA 26* 03/09/2015 0536       Problem List:  Patient Active Problem List   Diagnosis Date Noted  . Acute on chronic congestive heart failure ([place])   . Acute pneumonitis   . Goals of care, counseling/discussion   . Palliative care encounter   . Pressure ulcer 03/05/2015  . Malnutrition of moderate degree 03/05/2015  . Elevated troponin   . CHF (congestive heart failure) ([place]) 03/04/2015  . Acute on chronic systolic CHF (congestive heart failure) ([place]) 03/04/2015  . HCAP (healthcare-associated pneumonia) 03/04/2015  . Hyponatremia 03/04/2015  . Acute respiratory failure with  hypoxia ([place]) 03/04/2015  . Diarrhea 03/03/2015  . Edema 03/03/2015  . TIA (transient ischemic attack)   . UTI (lower urinary tract infection) 02/17/2015  . CAP (community acquired pneumonia) 02/17/2015  . Acute on chronic combined systolic and diastolic congestive heart failure ([place]) 02/17/2015  . Slurred speech 02/17/2015  . Dizziness 02/17/2015  . ARF (acute renal failure) ([place]) 07/12/2014  . BPH (benign prostatic hyperplasia) 07/12/2014  . Fall 07/12/2014  . Hypothyroidism 07/12/2014  . Colovesical fistula 06/11/2014  . Chronic atrial fibrillation ([place]) 01/14/2014  .  Chronic combined systolic and diastolic congestive heart failure ([place]) 01/14/2014  . Merkel cell carcinoma ([place]) 06/30/2013  . Dyslipidemia 05/23/2008  . Essential hypertension 05/23/2008     Palliative Care Assessment & Plan    1.Code Status:  DNR    Code Status Orders        Start     Ordered   03/04/15 2252  Do not attempt resuscitation (DNR)   Continuous    Question Answer Comment  In the event of cardiac or respiratory ARREST Do not call a "code blue"   In the event of cardiac or respiratory ARREST Do not perform Intubation, CPR, defibrillation or ACLS   In the event of cardiac or respiratory ARREST Use medication by any route, position, wound care, and other measures to relive pain and suffering. May use oxygen, suction and manual treatment of airway obstruction as needed for comfort.      03/04/15 2257    Code Status History    Date Active Date Inactive Code Status Order ID Comments User Context   02/19/2015  8:46 AM 02/25/2015  5:59 PM Full Code [id number]  Jonetta Osgood, MD Inpatient   02/17/2015  6:27 PM 02/19/2015  8:46 AM DNR [id number]  Lavina Hamman, MD ED   07/12/2014  5:02 PM 07/14/2014  3:27 PM Full Code [id number]  Robbie Lis, MD Inpatient   06/12/2014  1:12 PM 06/23/2014  3:25 PM Full Code [id number]  Armandina Gemma, MD Inpatient   04/09/2014 12:59 AM 04/11/2014  2:52 PM Full Code [id number]  Lavina Hamman, MD ED   01/14/2014 11:39 PM 01/19/2014  6:56 PM Full Code [id number]  Rise Patience, MD Inpatient    Advance Directive Documentation        Most Recent Value   Type of Advance Directive  Living will, Healthcare Power of Attorney   Pre-existing out of facility DNR order (yellow form or pink MOST form)     "MOST" Form in Place?         2. Goals of Care/Additional Recommendations:  He has rapidly progressing pneumonitis. Currently, he is on a trial of high dose steroids. If it does not improve, his goal is to transition home with hospice support. Will follow-up  in additional 24-48 hours in order to assess his clinical course.  3. Symptom Management:      1. Dyspnea: Reports improved whenever he takes oral morphine. Encouraged him to continue to take on as-needed basis  4. Palliative Prophylaxis:   Aspiration, Bowel Regimen, Delirium Protocol and Frequent Pain Assessment  5. Prognosis: Unable to determine  6. Discharge Planning:  To be determined   Care plan was discussed with patient  Thank you for allowing the Palliative Medicine Team to assist in the care of this patient.   Time In: 0815 Time Out: 0835 Total Time 20 Prolonged Time Billed no        Micheline Rough, MD  03/09/2015,  9:38 AM  Please contact Palliative Medicine Team phone at [phone] for questions and concerns.

## 2015-03-09 NOTE — Progress Notes (Addendum)
Patient Name: Harold Jennings Date of Encounter: 03/09/2015  Principal Problem:   Acute respiratory failure with hypoxia [facility]) Active Problems:   Essential hypertension   Chronic atrial fibrillation (HCC)   ARF (acute renal failure) (HCC)   Hypothyroidism   Acute on chronic systolic CHF (congestive heart failure) (HCC)   HCAP (healthcare-associated pneumonia)   Hyponatremia   Pressure ulcer   Elevated troponin   Malnutrition of moderate degree   Acute on chronic congestive heart failure (HCC)   Acute pneumonitis   Goals of care, counseling/discussion   Palliative care encounter   Primary Cardiologist: Dr. Burt Knack Patient Profile: Harold Jennings is a 82 year old male with a past medical history of HTN, ICM last EF 40-45% , CAD with cath in 2000 showing total mid RCA with collaterals, and chronic Afib (on Xarelto). Recently was admitted from 02/17/15-02/25/15 for CAP and respiratory failure.   SUBJECTIVE: He lives at [facility] and was brought to ED on 03/04/15 for hypoxia and found to have an O2 sat of 87 on 4L. He continues to be in Afib with rate controlled.  No chest pain.  He is on NRB and remains short of breath. Weight is down 2 pounds from yesterday.   OBJECTIVE Filed Vitals:   03/08/15 1341 03/08/15 2130 03/08/15 2214 03/09/15 0450  BP: 94/56 106/68 106/65 108/75  Pulse: 91 87 72 63  Temp: 97.4 F (36.3 C) 97.3 F (36.3 C)  97.4 F (36.3 C)  TempSrc: Axillary Oral  Oral  Resp: 22 20  20   Height:      Weight:    156 lb 4.9 oz (70.9 kg)  SpO2: 97% 97%  99%    Intake/Output Summary (Last 24 hours) at 03/09/15 0934 Last data filed at 03/09/15 [id number]  Gross per 24 hour  Intake    898 ml  Output   1175 ml  Net   -277 ml   Filed Weights   03/07/15 0600 03/08/15 0610 03/09/15 0450  Weight: 158 lb 11.7 oz (72 kg) 158 lb 8.2 oz (71.9 kg) 156 lb 4.9 oz (70.9 kg)    PHYSICAL EXAM General: Frail elderly male in no acute distress. Head: Normocephalic, atraumatic.  Neck:  Supple without bruits, no JVD Lungs:  Resp labored, lungs diminished in all fields.  Heart: Irregular rhythm, rate controlled. S1, S2, no S3, S4, or murmur; no rub. Abdomen: Soft, non-tender, non-distended, BS + x 4.  Extremities: No clubbing, cyanosis, edema.  Neuro: Alert and oriented X 3. Moves all extremities spontaneously. Psych: Normal affect.  LABS: CBC: Recent Labs  03/08/15 0538 03/09/15 0536  WBC 13.1* 5.4  HGB 10.0* 9.6*  HCT 31.1* 29.9*  MCV 101.0* 99.7  PLT 358 [id number]   Basic Metabolic Panel: Recent Labs  03/08/15 0538 03/09/15 0536  NA 130* 130*  K 5.1 6.0*  CL 98* 97*  CO2 23 22  GLUCOSE 94 177*  BUN 41* 50*  CREATININE 2.14* 2.51*  CALCIUM 8.4* 9.0   BNP:  B NATRIURETIC PEPTIDE  Date/Time Value Ref Range Status  03/05/2015 01:47 AM 1236.3* 0.0 - 100.0 pg/mL Final  03/04/2015 08:07 PM 1506.6* 0.0 - 100.0 pg/mL Final     TELE:   Afib with RBBB,  rate 70-80.   Current Medications:  . aspirin EC  81 mg Oral Daily  . ceFEPime (MAXIPIME) IV  1 g Intravenous Q24H  . doxycycline (VIBRAMYCIN) IV  100 mg Intravenous Q12H  . ezetimibe  10 mg Oral q  morning - 10a  . feeding supplement (ENSURE ENLIVE)  237 mL Oral BID BM  . finasteride  5 mg Oral Daily  . furosemide  40 mg Oral Daily  . levothyroxine  125 mcg Oral QAC breakfast  . methylPREDNISolone (SOLU-MEDROL) injection  500 mg Intravenous Q12H  . metoprolol succinate  12.5 mg Oral BID  . midodrine  10 mg Oral TID WC  . nystatin-triamcinolone  1 application Topical TID  . Rivaroxaban  15 mg Oral q1800  . saccharomyces boulardii  250 mg Oral q morning - 10a  . sertraline  50 mg Oral Daily  . sodium chloride flush  3 mL Intravenous Q12H  . tamsulosin  0.4 mg Oral Daily      ASSESSMENT AND PLAN: Principal Problem:   Acute respiratory failure with hypoxia (HCC) Active Problems:   Essential hypertension   Chronic atrial fibrillation (HCC)   ARF (acute renal failure) (HCC)   Hypothyroidism   Acute  on chronic systolic CHF (congestive heart failure) (HCC)   HCAP (healthcare-associated pneumonia)   Hyponatremia   Pressure ulcer   Elevated troponin   Malnutrition of moderate degree   Acute on chronic congestive heart failure (HCC)   Acute pneumonitis   Goals of care, counseling/discussion   Palliative care encounter  1. CHF- Acute on Chronic D/C Lasix  D/C potassium.  Does not appear volume overloaded on exam  DNR- patient was seen by Palliative team yesterday.  Continue beta blocker as BP tolerates. MD advise if Cardiology should continue to follow.   2. Afib Stable, continue Xarelto   3. SOB  Followed by PCCM.   4. Elevated troponin Likely demand ischemia, patient is chest pain free.    Signed, Arbutus Leas , NP  9:34 AM 03/09/2015    Attending Note:   The patient was seen and examined.  Agree with assessment and plan as noted above.  Changes made to the above note as needed.  CHF -  He has chronic CHF but I do not think this decompensation was due to CHF exacerbation - his renal function has actually worsened with diuresis. His WBC has improved on ABX.   I would hold lasix and potassium  He may need a repeat potassium later today  given his last level of 6.0  His PO intake is very poor   He is now a DNR.  Will sign off.  Call for questions    Ramond Dial., MD, [facility] 03/09/2015, 11:18 AM [address],  [place] Pager [phone]

## 2015-03-09 NOTE — Progress Notes (Signed)
Rt placed pt on 10 LPM HFNC per Dr. [name] Bells request. No distress noted at this time.

## 2015-03-09 NOTE — Progress Notes (Signed)
Patient ID: Harold Jennings, male   DOB: Apr 02, 1932, 82 y.o.   MRN: [id number]  [facility] PROGRESS NOTE  Harold Jennings [id number] DOB: [birth year] DOA: 03/04/2015 PCP: London Pepper, MD   Brief narrative:    82 y.o. male with PMH of chronic atrial fibrillation on Xarelto, chronic systolic CHF (EF [id number]), hypertension, and lumbar spinal stenosis who presented from his SNF for evaluation of acute hypoxic respiratory failure. Patient was admitted to [facility] from 02/17/2015 - 02/25/2015, during which time he was aggressively diuresed and treated empirically for CAP with azithromycin and Rocephin. His symptoms have gotten worse in the past 24 hours, was dyspneic at rest and with minimal exertional.  In ED, patient was found to be afebrile, saturating in the 80s on 4 L/m supplemental oxygen, and with remaining vital signs stable. A chest x-ray demonstrated diffuse bilateral patchy opacities concerning for pneumonia, with edema less likely. EKG featured atrial fibrillation with right bundle branch block and borderline ST depression in leads V4 to V6. Chem panel notable for a mild hyponatremia, serum bicarbonate of 19, and serum creatinine of 1.79 which is up from his baseline of 1, but improving from time of recent discharge. BNP returned elevated to 1500 and troponin is also elevated to 0.75. CBC is unremarkable. In the emergency department, the patient was given a 40 mg IV push of Lasix and treated with empiric vancomycin and cefepime. His O2 sat duration and improved to the 90s on oxi-mask, he remained hemodynamically stable.  Major events since admission: 2/18 - still on NRB, unable to wean off, CT chest requested  2/19 - added doxycycline for atypical coverage, also added Lasix 40 mg in addition to QD dosing for better diuresis    Assessment/Plan:    1. Acute on chronic hypoxic respiratory failure  - per PCCM ? Viral pneumonitis possible etiology but still largely unclear  -  Discharged 1 wk ago on 4 Lpm, but sat persisting <90% at rest at SNF, improved with 10 Lpm via NRB - still requiring NRB this AM but will try to taper to 10L oxygen via [place] - CT chest requested for further evaluation, persistent cardiomegaly with pulmonary vascular congestion and ? Atypical PNA - stoppedvancomycin and added doxycycline to maxipime 2/19 - no signs of volume overload this AM, lasix was stopped per cardiology - appreciate PCCM and cardiologist assistance   2. Acute on chronic systolic CHF  - TTE (123456) EF 40-45%, mild LVH, severe LA dilation, mild-moderately elevated PA pressures  - BNP 1500 on arrival, was 820 on 02/17/15  - Diuresed with IV Lasix, resolved - weight dwon since admission 164 lbs --> 158 lbs --> 156 lbs this AM - monitor strict I/Os, daily wts, SLIV, fluid-restrict diet  - Midodrine 10 mg TID for BP support during diuresis  - Continue ASA 81, Zetia, resume beta-blocker  - has been intolerant to statins and ACE/ARB precluded by CKD - hold Lasix for now  3. HCAP, diffuse, bilateral lobes  - CXR findings c/w PNA and per CT chest ? Atypical PNA - continue cefepime day #6, doxycyclne day #3 - strep pneumo antigens, sputum for GS and cx no growth to date - viral panel pending   4. Acute kidney injury with hyperkalemia  - SCr 1.8 on arrival, up from an apparent baseline of 1 - Cr trending up with diuresis - Avoid nephrotoxins where feasible  - give dose of kayexalate  - stop lasix, BMP in AM  5. Chronic atrial  fibrillation  - LA severely dilated on TTE, suggesting that this will be permanent  - Continue AC with Xarelto  - Rate-controlled effectively with Toprol XL  6. Hyponatremia  - Likely secondary to CHF and the associated relative arterial blood volume depletion (and subsequent increase in ADH production)  - BMP in AM  7. Hypothyroidism - Continue current home-dose Synthroid   8. BPH  - Continue Flomax, finasteride  - Foley in place  currently, will d/c as able   9. Elevated cardiac biomarker - Troponin I elevated to 0.75 on admission, repeat troponin relatively unchanged - pt very clear he does not want any invasive interventions, confirms DNR code status  - no further interventions needs per cardiology team   DVT prophylaxis - on full AC Xarelto   Code Status: DNR Family Communication:  plan of care discussed with the patient and wife at bedside  Disposition Plan: SNF by 2/24  IV access:  Peripheral IV  Procedures and diagnostic studies:    Dg Chest 2 View 03/04/2015 Diffuse lung opacity with patchy appearance favoring pneumonia over edema. 2. Suspect underlying interstitial lung disease.  Medical Consultants:  PCCM PCT Cardiology   Other Consultants:  None  IAnti-Infectives:   Vancomycin 2/16 --> 2/19 Maxipime 2/16 --> Doxycycline 2/19 -->  [name], MD  [facility] Pager [phone]  If 7PM-7AM, please contact night-coverage [email or web address] Password [facility] 03/09/2015, 4:26 PM   LOS: 5 days   HPI/Subjective: No events overnight. Still concerned with dyspnea at rest and with minimal exertion.   Objective: Filed Vitals:   03/09/15 0450 03/09/15 0900 03/09/15 1231 03/09/15 1341  BP: 108/75 100/57  103/48  Pulse: 63   75  Temp: 97.4 F (36.3 C)   97.3 F (36.3 C)  TempSrc: Oral   Oral  Resp: 20   14  Height:      Weight: 70.9 kg (156 lb 4.9 oz)     SpO2: 99%  97% 95%    Intake/Output Summary (Last 24 hours) at 03/09/15 1626 Last data filed at 03/09/15 1346  Gross per 24 hour  Intake    778 ml  Output    850 ml  Net    -72 ml    Exam:   General:  Pt is alert, follows commands appropriately, not in acute distress  Cardiovascular: Irregular rate and rhythm, no rubs, no gallops  Respiratory: Course breath sounds, diminished at bases  Abdomen: Soft, non tender, non distended, bowel sounds present, no guarding  Extremities: pulses DP and PT palpable bilaterally  Neuro:  Grossly nonfocal  Data Reviewed: Basic Metabolic Panel:  Recent Labs Lab 03/06/15 0553 03/07/15 0609 03/08/15 0538 03/09/15 0536 03/09/15 1213  NA 136 131* 130* 130* 128*  K 4.2 4.8 5.1 6.0* 6.0*  CL 105 100* 98* 97* 97*  CO2 22 23 23 22 22   GLUCOSE 115* 88 94 177* 217*  BUN 34* 40* 41* 50* 54*  CREATININE 1.95* 2.10* 2.14* 2.51* 2.47*  CALCIUM 8.3* 8.1* 8.4* 9.0 9.0  MG 1.8  --   --   --   --    CBC:  Recent Labs Lab 03/05/15 0147 03/06/15 0553 03/07/15 0609 03/08/15 0538 03/09/15 0536  WBC 11.3* 15.9* 15.0* 13.1* 5.4  NEUTROABS 7.9*  --   --   --   --   HGB 9.8* 9.8* 9.6* 10.0* 9.6*  HCT 29.8* 31.0* 29.8* 31.1* 29.9*  MCV 100.3* 101.3* 100.7* 101.0* 99.7  PLT 376 364 328 358 346  Recent Results (from the past 240 hour(s))  MRSA PCR Screening     Status: None   Collection Time: 03/05/15  9:39 AM  Result Value Ref Range Status   MRSA by PCR NEGATIVE NEGATIVE Final    Comment:        The GeneXpert MRSA Assay (FDA approved for NASAL specimens only), is one component of a comprehensive MRSA colonization surveillance program. It is not intended to diagnose MRSA infection nor to guide or monitor treatment for MRSA infections.      Scheduled Meds: . aspirin EC  81 mg Oral Daily  . ceFEPime (MAXIPIME) IV  1 g Intravenous Q24H  . doxycycline (VIBRAMYCIN) IV  100 mg Intravenous Q12H  . ezetimibe  10 mg Oral q morning - 10a  . feeding supplement (ENSURE ENLIVE)  237 mL Oral BID BM  . finasteride  5 mg Oral Daily  . levothyroxine  125 mcg Oral QAC breakfast  . methylPREDNISolone (SOLU-MEDROL) injection  500 mg Intravenous Q12H  . metoprolol succinate  12.5 mg Oral BID  . midodrine  10 mg Oral TID WC  . nystatin-triamcinolone  1 application Topical TID  . Rivaroxaban  15 mg Oral q1800  . saccharomyces boulardii  250 mg Oral q morning - 10a  . sertraline  50 mg Oral Daily  . sodium chloride flush  3 mL Intravenous Q12H  . tamsulosin  0.4 mg Oral Daily    Continuous Infusions:

## 2015-03-09 NOTE — Progress Notes (Signed)
   Name: Harold Jennings MRN: [id number] DOB: Dec 19, 1932    ADMISSION DATE:  03/04/2015 CONSULTATION DATE:  2/20  REFERRING MD :  Doyle Askew   CHIEF COMPLAINT:  Hypoxia   BRIEF PATIENT DESCRIPTION:  82 year old male admitted from SNF w/ working dx of decompensated HF and HCAp on 2/16. PCCM asked to see on 2/20 as he continued to have high O2 needs. He appears to have a rapidly progressive pulmonary fibrosis that do not have features of usual interstitial pneumonitis. There is suspicion for Macrobid related lung toxicity.  SIGNIFICANT EVENTS  03/08/2015 started on high-dose steroids  STUDIES:  U strep and legionella antigen 2/17: neg  Serial PCTs from admit to 2/20: negative  Sed rate 2/20>>> CT chest 2/18: CT scan of the abdomen pelvis shows mild interstitial change at the lung bases. Today's study shows much more pronounced interstitial opacification, along with small bilateral pleural effusions. There is cardio megaly with mild venous congestion. This study confirms significant acute interstitial process superimposed on mild chronic interstitial lung disease. The findings would suggest the possibility of congestive heart failure with interstitial edema, but clinical worsening despite diuresis indicates that the interstitial infiltrates seen bilaterally may be due to other etiologies including atypical pneumonia. .  SUBJECTIVE:  Feels about the same Steroid started yesterday  VITAL SIGNS: Temp:  [97.3 F (36.3 C)-97.4 F (36.3 C)] 97.4 F (36.3 C) (02/21 0450) Pulse Rate:  [63-91] 63 (02/21 0450) Resp:  [20-22] 20 (02/21 0450) BP: (94-108)/(56-75) 100/57 mmHg (02/21 0900) SpO2:  [97 %-99 %] 99 % (02/21 0450) Weight:  [70.9 kg (156 lb 4.9 oz)] 70.9 kg (156 lb 4.9 oz) (02/21 0450) 100% NRB PHYSICAL EXAMINATION:  Gen: chronically ill appearing HENT: OP clear, TM's clear, neck supple PULM: few crackles bases but otherwise suprisingly clear CV: Irreg irreg, no mgr, trace edema GI:  BS+, soft, nontender Derm: no cyanosis or rash Psyche: normal mood and affect    Recent Labs Lab 03/07/15 0609 03/08/15 0538 03/09/15 0536  NA 131* 130* 130*  K 4.8 5.1 6.0*  CL 100* 98* 97*  CO2 23 23 22   BUN 40* 41* 50*  CREATININE 2.10* 2.14* 2.51*  GLUCOSE 88 94 177*    Recent Labs Lab 03/07/15 0609 03/08/15 0538 03/09/15 0536  HGB 9.6* 10.0* 9.6*  HCT 29.8* 31.1* 29.9*  WBC 15.0* 13.1* 5.4  PLT 328 358 346   No results found.  ASSESSMENT / PLAN:  Acute Hypoxic Respiratory failure in the setting of diffuse pulmonary infiltrates> this is a rapidly progressive pneumonitis. Differential diagnosis includes an autoimmune process (less likely) versus drug-related. I question whether or not Macrobid may be related but admittedly, he has not used that since about November so it's not clear why he would just be developing interstitial infiltrates now. I do not think this is pulmonary edema nor do I think that this is bacterial pneumonia. He may have a viral pneumonitis.   Plan Consider stopping antibiotics Continue Solumedrol 1000mg  daily x3 days Wean O2 for O2 saturation > 90% Incentive spirometry Out of bed F/U auto immune studies sent Lasix per cardiology Repeat CXR 2 view on 2/23  Discussed with RD  Afib CHF (Chronic systolic HF) now compensated Demand ischemia  Plan Cont rate control and xarelto  Per cardiology   All other issues per [facility]  Roselie Awkward, MD [facility] Pager: [phone] Cell: [phone] After 3pm or if no response, call [phone]   03/09/2015, 12:25 PM

## 2015-03-10 LAB — CBC
HCT: 29.1 % — ABNORMAL LOW (ref 39.0–52.0)
Hemoglobin: 9.3 g/dL — ABNORMAL LOW (ref 13.0–17.0)
MCH: 31.5 pg (ref 26.0–34.0)
MCHC: 32 g/dL (ref 30.0–36.0)
MCV: 98.6 fL (ref 78.0–100.0)
PLATELETS: 349 10*3/uL (ref 150–400)
RBC: 2.95 MIL/uL — AB (ref 4.22–5.81)
RDW: 17 % — ABNORMAL HIGH (ref 11.5–15.5)
WBC: 9.5 10*3/uL (ref 4.0–10.5)

## 2015-03-10 LAB — BASIC METABOLIC PANEL
Anion gap: 12 (ref 5–15)
BUN: 65 mg/dL — AB (ref 6–20)
CALCIUM: 9 mg/dL (ref 8.9–10.3)
CO2: 19 mmol/L — ABNORMAL LOW (ref 22–32)
CREATININE: 2.58 mg/dL — AB (ref 0.61–1.24)
Chloride: 96 mmol/L — ABNORMAL LOW (ref 101–111)
GFR calc Af Amer: 25 mL/min — ABNORMAL LOW (ref >60)
GFR, EST NON AFRICAN AMERICAN: 22 mL/min — AB (ref >60)
Glucose, Bld: 181 mg/dL — ABNORMAL HIGH (ref 65–99)
Potassium: 5.5 mmol/L — ABNORMAL HIGH (ref 3.5–5.1)
SODIUM: 127 mmol/L — AB (ref 135–145)

## 2015-03-10 MED ORDER — DIPHENHYDRAMINE HCL 12.5 MG/5ML PO ELIX
12.5000 mg | ORAL_SOLUTION | ORAL | Status: AC | PRN
  Administered 2015-03-10 – 2015-03-11 (×2): 12.5 mg via ORAL
  Filled 2015-03-10 (×2): qty 5

## 2015-03-10 MED ORDER — SODIUM POLYSTYRENE SULFONATE 15 GM/60ML PO SUSP: 15.0000 g | Freq: Once | ORAL | Status: DC

## 2015-03-10 NOTE — Progress Notes (Signed)
Nutrition Follow-up  DOCUMENTATION CODES:   Non-severe (moderate) malnutrition in context of chronic illness  INTERVENTION:  - Continue Ensure BID, will monitor for desire to increase to TID - Continue to encourage PO intakes of meals and supplements - RD will continue to monitor for needs  NUTRITION DIAGNOSIS:   Unintentional weight loss related to chronic illness, poor appetite as evidenced by percent weight loss. -ongoing, 2 lb weight loss since 03/05/15  GOAL:   Patient will meet greater than or equal to 90% of their needs -unmet  MONITOR:   PO intake, Supplement acceptance, Weight trends, Labs, Skin, I & O's  ASSESSMENT:   82 y.o. male with PMH of chronic atrial fibrillation on Xarelto, chronic systolic CHF (EF [id number]), hypertension, and lumbar spinal stenosis who presents from his SNF for evaluation of acute hypoxic respiratory failure. Patient was admitted to [facility] from 02/17/2015 - 02/25/2015, during which time he was aggressively diuresed and treated empirically for CAP with azithromycin and Rocephin. Patient made good progress during the hospitalization, but had persistent O2 requirement and was discharged to SNF on 4 L/m nasal cannula. Today, patient was noted to have saturations in the low 80s on 4 L/m at the SNF and EMS was activated for transport to the hospital. Patient himself reports fatigue and general malaise, but denies chest pain, palpitations, dyspnea, or cough. He denies fever or chills. He has not had rhinorrhea, sore throat, nausea, or vomiting. He reported good improvement during the last hospitalization, but states that shortly after discharge he began to develop worsening fatigue and a nonspecific malaise.  2/22 Pt consumed 10% of lunch 2/20 and 25% of dinner 2/21; no other documented intakes since previous assessment. Pt was on NRB mask previously and has now been transitioned to nassal cannula; documented yesterday at 1232 by RT that pt was on 10 L/m  HFNC.   Spoke with pt and wife, who is at bedside. While on NRB pt was able to remove mask to take one bite/sip and immediately replace mask. Pt states that since he has been transitioned to [place] he has not experienced exacerbated SOB during meals and feels comfortable while eating and drinking. Wife states that pt is often able to feed himself. Pt has been drinking Ensure supplements and RN brought one for pt during the course of RD visit.   Pt denies chewing or swallowing issues. Encouraged continued use of Ensure and to focus on high protein foods due to breathing needs. Wife asks if there are any foods that pt should avoid and RD encouraged pt to eat any items he feels comfortable consuming and that do not exacerbate SOB but otherwise no restrictions. Protein needs increased at this time given breathing status.   Not meeting needs. Medications reviewed. Labs reviewed; Na: 127 mmol/L, K: 5.5 mmol/L, Cl: 96 mmol/L, BUN/creatinine elevated and trending up. GFR: 22.    2/17 - No intakes documented.  - Visualized breakfast tray with 25% completion of oatmeal, 100% of liquids on tray.  - Pt denies abdominal pain or nausea now or PTA with intakes and denies chewing or swallowing issues.  - He states that he has had ongoing poor appetite but is unsure of time frame.  - Pt also states that he has lost 100 lbs unintentionally in the past 1.5 years.  - This would indicate 38% wt loss in this time frame which is significant. - Per chart review, he has lost 16 lbs (9% body weight) in the past 3 months  which is also significant for time frame.  - Moderate muscle and moderate fat wasting noted to upper body and mild edema to BLE.  - He states that he had previously been drinking nutrition supplements but has not been consuming them recently.  - He has Ensure ordered BID. - Encouraged pt to accept and consume supplement when it is offered.  Diet Order:  Diet regular Room service appropriate?: Yes; Fluid  consistency:: Thin  Skin:  Wound (see comment) (Stage 2 sacral pressure ulcer)  Last BM:  2/18  Height:   Ht Readings from Last 1 Encounters:  03/05/15 5\' 5"  (1.651 m)    Weight:   Wt Readings from Last 1 Encounters:  03/10/15 160 lb 4.4 oz (72.7 kg)    Ideal Body Weight:  61.82 kg (kg)  BMI:  Body mass index is 26.67 kg/(m^2).  Estimated Nutritional Needs:   Kcal:  1625-1850 (22-25 kcal/kg)  Protein:  85-100 grams (1.2-1.4 grams/kg)  Fluid:  >/= 2 L/day  EDUCATION NEEDS:   No education needs identified at this time     Jarome Matin, RD, LDN Inpatient Clinical Dietitian Pager # [phone] After hours/weekend pager # [phone]

## 2015-03-10 NOTE — Progress Notes (Signed)
Chaplain Note:  I have known patient for many years. 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 Ileana Roup who is the priest of [place]. Marland Kitchen His wife has some key people who are supportive for her and they have children who are out of state, I believe. He is awarm and   I have spent some significant time with him during this hospitalization and the last and will continue to be of support while he is here.  Wells Guiles Director Spiritual Care Dept.  [phone].

## 2015-03-10 NOTE — Progress Notes (Signed)
Name: Harold Jennings MRN: [id number] DOB: 09-20-1932    ADMISSION DATE:  03/04/2015 CONSULTATION DATE:  2/20  REFERRING MD :  Harold Jennings   CHIEF COMPLAINT:  Hypoxia   BRIEF PATIENT DESCRIPTION:  82 year old male admitted from SNF w/ working dx of decompensated HF and HCAp on 2/16. PCCM asked to see on 2/20 as he continued to have high O2 needs. He appears to have a rapidly progressive pulmonary fibrosis that do not have features of usual interstitial pneumonitis. There is suspicion for Macrobid related lung toxicity.  SIGNIFICANT EVENTS  03/08/2015 started on high-dose steroids  STUDIES:  U strep and legionella antigen 2/17: neg  Serial PCTs from admit to 2/20: negative  Sed rate 2/20>>> CT chest 2/18: CT scan of the abdomen pelvis shows mild interstitial change at the lung bases. Today's study shows much more pronounced interstitial opacification, along with small bilateral pleural effusions. There is cardio megaly with mild venous congestion. This study confirms significant acute interstitial process superimposed on mild chronic interstitial lung disease. The findings would suggest the possibility of congestive heart failure with interstitial edema, but clinical worsening despite diuresis indicates that the interstitial infiltrates seen bilaterally may be due to other etiologies including atypical pneumonia. .  SUBJECTIVE:  Feels about the same Steroid started yesterday  VITAL SIGNS: Temp:  [97.3 F (36.3 C)-97.6 F (36.4 C)] 97.6 F (36.4 C) (02/22 0338) Pulse Rate:  [75-82] 81 (02/22 0338) Resp:  [14-20] 20 (02/22 0338) BP: (103-112)/(48-66) 112/66 mmHg (02/22 0338) SpO2:  [91 %-97 %] 91 % (02/22 0338) Weight:  [160 lb 4.4 oz (72.7 kg)] 160 lb 4.4 oz (72.7 kg) (02/22 0338) 100% NRB PHYSICAL EXAMINATION:  Gen: chronically ill appearing HENT: OP clear, TM's clear, neck supple PULM: few crackles bases but otherwise suprisingly clear CV: Irreg irreg, no mgr, trace edema GI:  BS+, soft, nontender Derm: no cyanosis or rash Psyche: normal mood and affect    Recent Labs Lab 03/09/15 0536 03/09/15 1213 03/10/15 0521  NA 130* 128* 127*  K 6.0* 6.0* 5.5*  CL 97* 97* 96*  CO2 22 22 19*  BUN 50* 54* 65*  CREATININE 2.51* 2.47* 2.58*  GLUCOSE 177* 217* 181*    Recent Labs Lab 03/08/15 0538 03/09/15 0536 03/10/15 0521  HGB 10.0* 9.6* 9.3*  HCT 31.1* 29.9* 29.1*  WBC 13.1* 5.4 9.5  PLT 358 346 349   No results found.  ASSESSMENT / PLAN:  Acute Hypoxic Respiratory failure in the setting of diffuse pulmonary infiltrates> this is a rapidly progressive pneumonitis. Differential diagnosis includes an autoimmune process (less likely) versus drug-related. I question whether or not Macrobid may be related but admittedly, he has not used that since about November so it's not clear why he would just be developing interstitial infiltrates now. I do not think this is pulmonary edema nor do I think that this is bacterial pneumonia. He may have a viral pneumonitis.   Plan Consider stopping antibiotics Continue Solumedrol 1000mg  daily x3 days; will repeat 2 view;  CXR in am 2/23 Wean O2 for O2 saturation > 90% Incentive spirometry Out of bed F/U auto immune studies sent Lasix per cardiology   Discussed with RD  Afib CHF (Chronic systolic HF) now compensated Demand ischemia  Plan Cont rate control and xarelto  Per cardiology   All other issues per [place] ACNP-BC [name] Pager # [phone] OR # [phone] if no answer    03/10/2015, 10:18 AM  Attending:  I have seen and examined the patient with nurse practitioner/resident and agree with the note above.  We formulated the plan together and I elicited the following history.    [name] feels better, on less oxygen Lungs still surprisingly clear, normal effort Good spirits today CV: irreg irreg, warm extremities without edema  Acute respiratory failure with  hypoxemia likely due to acute pneumonitis, improving> plan continued solumedrol today, then repeat CXR tomorrow morning. Mobilize today, to chair.  Roselie Awkward, MD [facility] Pager: [phone] Cell: [phone] After 3pm or if no response, call [phone]

## 2015-03-10 NOTE — Progress Notes (Signed)
Patient ID: Harold Jennings, male   DOB: June 23, 1932, 82 y.o.   MRN: [id number]  [facility] PROGRESS NOTE  Harold Jennings [id number] DOB: 12/17/1932 DOA: 03/04/2015 PCP: London Pepper, MD   Brief narrative:    82 y.o. male with PMH of chronic atrial fibrillation on Xarelto, chronic systolic CHF (EF [id number]), hypertension, and lumbar spinal stenosis who presented from his SNF for evaluation of acute hypoxic respiratory failure. Patient was admitted to [facility] from 02/17/2015 - 02/25/2015, during which time he was aggressively diuresed and treated empirically for CAP with azithromycin and Rocephin. His symptoms have gotten worse in the past 24 hours, was dyspneic at rest and with minimal exertional.  In ED, patient was found to be afebrile, saturating in the 80s on 4 L/m supplemental oxygen, and with remaining vital signs stable. A chest x-ray demonstrated diffuse bilateral patchy opacities concerning for pneumonia, with edema less likely. EKG featured atrial fibrillation with right bundle branch block and borderline ST depression in leads V4 to V6. Chem panel notable for a mild hyponatremia, serum bicarbonate of 19, and serum creatinine of 1.79 which is up from his baseline of 1, but improving from time of recent discharge. BNP returned elevated to 1500 and troponin is also elevated to 0.75. CBC is unremarkable. In the emergency department, the patient was given a 40 mg IV push of Lasix and treated with empiric vancomycin and cefepime. His O2 sat duration and improved to the 90s on oxi-mask, he remained hemodynamically stable.  Major events since admission: 2/18 - still on NRB, unable to wean off, CT chest requested  2/19 - added doxycycline for atypical coverage, also added Lasix 40 mg in addition to QD dosing for better diuresis  2/21 - off NRB, transitioned to [place]  Assessment/Plan:    1. Acute on chronic hypoxic respiratory failure  - per PCCM ? Viral pneumonitis possible etiology but  still largely unclear  - Discharged 1 wk ago on 4 Lpm, but sat persisting <90% at rest at SNF, improved with 10 Lpm via NRB - thankfully able to transition to [place] even though still at 10 L but this is some progresses since admission  - stoppedvancomycin and added doxycycline to maxipime 2/19 - no signs of volume overload this AM, lasix was stopped per cardiology 2/21 - appreciate PCCM and cardiologist assistance   2. Acute on chronic systolic CHF  - TTE (123456) EF 40-45%, mild LVH, severe LA dilation, mild-moderately elevated PA pressures  - BNP 1500 on arrival, was 820 on 02/17/15  - Diuresed with IV Lasix, resolved - weight dwon since admission 164 lbs --> 158 lbs --> 156 lbs --> 160 lbs this AM - monitor strict I/Os, daily wts, SLIV, fluid-restrict diet  - Midodrine 10 mg TID for BP support during diuresis  - Continue ASA 81, Zetia, resume beta-blocker  - has been intolerant to statins and ACE/ARB precluded by CKD - hold Lasix for now, on hold since 2/21  3. HCAP, diffuse, bilateral lobes  - CXR findings c/w PNA and per CT chest ? Atypical PNA - continue cefepime day #7, doxycyclne day #4 - strep pneumo antigens, sputum for GS and cx no growth to date - viral panel pending   4. Acute kidney injury with hyperkalemia  - SCr 1.8 on arrival, up from an apparent baseline of 1 - Cr trending up with diuresis - Avoid nephrotoxins where feasible  - given dose of kayexalate again this AM, K is down a bit -  repeat BMP in AM  5. Chronic atrial fibrillation  - LA severely dilated on TTE, suggesting that this will be permanent  - Continue AC with Xarelto  - Rate-controlled effectively with Toprol XL  6. Hyponatremia  - slightly down in the past 24 hours  - BMP in AM  7. Hypothyroidism - Continue current home-dose Synthroid   8. BPH  - Continue Flomax, finasteride  - Foley in place currently, will d/c as able   9. Elevated cardiac biomarker - Troponin I elevated to  0.75 on admission, repeat troponin relatively unchanged - pt very clear he does not want any invasive interventions, confirms DNR code status  - no further interventions needs per cardiology team   DVT prophylaxis - on full AC Xarelto   Code Status: DNR Family Communication:  plan of care discussed with the patient and wife at bedside  Disposition Plan: SNF by 2/25  IV access:  Peripheral IV  Procedures and diagnostic studies:    Dg Chest 2 View 03/04/2015 Diffuse lung opacity with patchy appearance favoring pneumonia over edema. 2. Suspect underlying interstitial lung disease.  Medical Consultants:  PCCM PCT Cardiology   Other Consultants:  None  IAnti-Infectives:   Vancomycin 2/16 --> 2/19 Maxipime 2/16 --> Doxycycline 2/19 -->  [name], MD  [facility] Pager [phone]  If 7PM-7AM, please contact night-coverage [email or web address] Password [facility] 03/10/2015, 4:28 PM   LOS: 6 days   HPI/Subjective: No events overnight. Somewhat better this AM, on [place], has been off NRB since yesterday and is rather happy with that.   Objective: Filed Vitals:   03/09/15 2048 03/10/15 0338 03/10/15 1035 03/10/15 1335  BP: 110/64 112/66 111/66 111/50  Pulse: 82 81 76 73  Temp: 97.6 F (36.4 C) 97.6 F (36.4 C)  97.3 F (36.3 C)  TempSrc: Oral Oral  Oral  Resp: 20 20  16   Height:      Weight:  72.7 kg (160 lb 4.4 oz)    SpO2: 93% 91%  96%    Intake/Output Summary (Last 24 hours) at 03/10/15 1628 Last data filed at 03/10/15 1339  Gross per 24 hour  Intake    714 ml  Output    935 ml  Net   -221 ml    Exam:   General:  Pt is alert, follows commands appropriately, not in acute distress  Cardiovascular: Irregular rate and rhythm, no rubs, no gallops  Respiratory: Course breath sounds, diminished at bases  Abdomen: Soft, non tender, non distended, bowel sounds present, no guarding  Extremities: pulses DP and PT palpable bilaterally  Neuro: Grossly nonfocal  Data  Reviewed: Basic Metabolic Panel:  Recent Labs Lab 03/06/15 0553 03/07/15 0609 03/08/15 0538 03/09/15 0536 03/09/15 1213 03/10/15 0521  NA 136 131* 130* 130* 128* 127*  K 4.2 4.8 5.1 6.0* 6.0* 5.5*  CL 105 100* 98* 97* 97* 96*  CO2 22 23 23 22 22  19*  GLUCOSE 115* 88 94 177* 217* 181*  BUN 34* 40* 41* 50* 54* 65*  CREATININE 1.95* 2.10* 2.14* 2.51* 2.47* 2.58*  CALCIUM 8.3* 8.1* 8.4* 9.0 9.0 9.0  MG 1.8  --   --   --   --   --    CBC:  Recent Labs Lab 03/05/15 0147 03/06/15 0553 03/07/15 0609 03/08/15 0538 03/09/15 0536 03/10/15 0521  WBC 11.3* 15.9* 15.0* 13.1* 5.4 9.5  NEUTROABS 7.9*  --   --   --   --   --   HGB 9.8* 9.8*  9.6* 10.0* 9.6* 9.3*  HCT 29.8* 31.0* 29.8* 31.1* 29.9* 29.1*  MCV 100.3* 101.3* 100.7* 101.0* 99.7 98.6  PLT 376 364 328 358 346 349   Recent Results (from the past 240 hour(s))  MRSA PCR Screening     Status: None   Collection Time: 03/05/15  9:39 AM  Result Value Ref Range Status   MRSA by PCR NEGATIVE NEGATIVE Final    Comment:        The GeneXpert MRSA Assay (FDA approved for NASAL specimens only), is one component of a comprehensive MRSA colonization surveillance program. It is not intended to diagnose MRSA infection nor to guide or monitor treatment for MRSA infections.      Scheduled Meds: . aspirin EC  81 mg Oral Daily  . ceFEPime (MAXIPIME) IV  1 g Intravenous Q24H  . doxycycline (VIBRAMYCIN) IV  100 mg Intravenous Q12H  . ezetimibe  10 mg Oral q morning - 10a  . feeding supplement (ENSURE ENLIVE)  237 mL Oral BID BM  . finasteride  5 mg Oral Daily  . levothyroxine  125 mcg Oral QAC breakfast  . methylPREDNISolone (SOLU-MEDROL) injection  500 mg Intravenous Q12H  . metoprolol succinate  12.5 mg Oral BID  . midodrine  10 mg Oral TID WC  . nystatin-triamcinolone  1 application Topical TID  . Rivaroxaban  15 mg Oral q1800  . saccharomyces boulardii  250 mg Oral q morning - 10a  . sertraline  50 mg Oral Daily  . sodium  chloride flush  3 mL Intravenous Q12H  . tamsulosin  0.4 mg Oral Daily   Continuous Infusions:

## 2015-03-10 NOTE — Progress Notes (Signed)
Daily Progress Note   Patient Name: Harold Jennings       Date: 03/10/2015 DOB: 07-Oct-1932  Age: 82 y.o. MRN#: [id number] Attending Physician: Theodis Blaze, MD Primary Care Physician: London Pepper, MD Admit Date: 03/04/2015  Reason for Consultation/Follow-up: Establishing goals of care  Subjective: Patient reports feeling much better. He denies other needs at this time. No pain. Dyspnea controlled.  Length of Stay: 6 days  Current Medications: Scheduled Meds:  . aspirin EC  81 mg Oral Daily  . ceFEPime (MAXIPIME) IV  1 g Intravenous Q24H  . doxycycline (VIBRAMYCIN) IV  100 mg Intravenous Q12H  . ezetimibe  10 mg Oral q morning - 10a  . feeding supplement (ENSURE ENLIVE)  237 mL Oral BID BM  . finasteride  5 mg Oral Daily  . levothyroxine  125 mcg Oral QAC breakfast  . methylPREDNISolone (SOLU-MEDROL) injection  500 mg Intravenous Q12H  . metoprolol succinate  12.5 mg Oral BID  . midodrine  10 mg Oral TID WC  . nystatin-triamcinolone  1 application Topical TID  . Rivaroxaban  15 mg Oral q1800  . saccharomyces boulardii  250 mg Oral q morning - 10a  . sertraline  50 mg Oral Daily  . sodium chloride flush  3 mL Intravenous Q12H  . tamsulosin  0.4 mg Oral Daily    Continuous Infusions:    PRN Meds: sodium chloride, acetaminophen, levalbuterol, lip balm, loperamide, morphine CONCENTRATE, ondansetron (ZOFRAN) IV, sodium chloride flush  Physical Exam: Physical Exam    General: Chronically ill appearing, elderly male, on oximizer Neuro: Awake, alert, hard of hearing.  HEENT: No JVD, MMM  Cardiovascular: Regular irreg; no mrg Lungs: Crackles bases + accessory muscle use  Abdomen: soft + bowel sounds  Musculoskeletal: Generalized weakness. Equal st and bulk   Skin: Intact warm and dry             Vital Signs: BP 111/66 mmHg  Pulse 76  Temp(Src) 97.6 F (36.4 C) (Oral)  Resp 20  Ht 5\' 5"  (1.651 m)  Wt 72.7 kg (160 lb 4.4 oz)  BMI 26.67 kg/m2  SpO2 91% SpO2: SpO2: 91 % O2 Device: O2 Device: Nasal Cannula O2 Flow Rate: O2 Flow Rate (L/min): 10 L/min  Intake/output summary:   Intake/Output Summary (Last 24 hours) at 03/10/15 1150  Last data filed at 03/10/15 0700  Gross per 24 hour  Intake    474 ml  Output    810 ml  Net   -336 ml   LBM: Last BM Date: 03/06/15 Baseline Weight: Weight: 74.6 kg (164 lb 7.4 oz) Most recent weight: Weight: 72.7 kg (160 lb 4.4 oz)       Palliative Assessment/Data: Flowsheet Rows        Most Recent Value   Intake Tab    Referral Department  Hospitalist   Unit at Time of Referral  Cardiac/Telemetry Unit   Palliative Care Primary Diagnosis  Pulmonary   Date Notified  03/06/15   Palliative Care Type  New Palliative care   Reason for referral  Clarify Goals of Care   Date of Admission  03/04/15   Date first seen by Palliative Care  03/08/15   # of days Palliative referral response time  2 Day(s)   # of days IP prior to Palliative referral  2   Clinical Assessment    Palliative Performance Scale Score  50%   Pain Max last 24 hours  0   Pain Min Last 24 hours  0   Dyspnea Max Last 24 Hours  0   Dyspnea Min Last 24 hours  0   Psychosocial & Spiritual Assessment    Palliative Care Outcomes    Patient/Family meeting held?  Yes   Who was at the meeting?  Patient, wife   Palliative Care Outcomes  Clarified goals of care   Palliative Care follow-up planned  Yes, Home      Additional Data Reviewed: CBC    Component Value Date/Time   WBC 9.5 03/10/2015 0521   RBC 2.95* 03/10/2015 0521   HGB 9.3* 03/10/2015 0521   HCT 29.1* 03/10/2015 0521   PLT 349 03/10/2015 0521   MCV 98.6 03/10/2015 0521   MCH 31.5 03/10/2015 0521   MCHC 32.0 03/10/2015 0521   RDW 17.0* 03/10/2015 0521   LYMPHSABS 1.8  03/05/2015 0147   MONOABS 1.1* 03/05/2015 0147   EOSABS 0.4 03/05/2015 0147   BASOSABS 0.0 03/05/2015 0147    CMP     Component Value Date/Time   NA 127* 03/10/2015 0521   K 5.5* 03/10/2015 0521   CL 96* 03/10/2015 0521   CO2 19* 03/10/2015 0521   GLUCOSE 181* 03/10/2015 0521   BUN 65* 03/10/2015 0521   CREATININE 2.58* 03/10/2015 0521   CALCIUM 9.0 03/10/2015 0521   PROT 7.3 02/17/2015 1553   ALBUMIN 3.4* 02/17/2015 1553   AST 43* 02/17/2015 1553   ALT 38 02/17/2015 1553   ALKPHOS 216* 02/17/2015 1553   BILITOT 1.0 02/17/2015 1553   GFRNONAA 22* 03/10/2015 0521   GFRAA 25* 03/10/2015 0521       Problem List:  Patient Active Problem List   Diagnosis Date Noted  . ILD (interstitial lung disease) ([place])   . Dyspnea   . Acute respiratory failure with hypoxemia ([place])   . Acute on chronic congestive heart failure ([place])   . Acute pneumonitis   . Goals of care, counseling/discussion   . Palliative care encounter   . Pressure ulcer 03/05/2015  . Malnutrition of moderate degree 03/05/2015  . Elevated troponin   . CHF (congestive heart failure) ([place]) 03/04/2015  . Acute on chronic systolic CHF (congestive heart failure) ([place]) 03/04/2015  . HCAP (healthcare-associated pneumonia) 03/04/2015  . Hyponatremia 03/04/2015  . Acute respiratory failure with hypoxia ([place]) 03/04/2015  . Diarrhea 03/03/2015  .  Edema 03/03/2015  . TIA (transient ischemic attack)   . UTI (lower urinary tract infection) 02/17/2015  . CAP (community acquired pneumonia) 02/17/2015  . Acute on chronic combined systolic and diastolic congestive heart failure ([place]) 02/17/2015  . Slurred speech 02/17/2015  . Dizziness 02/17/2015  . ARF (acute renal failure) ([place]) 07/12/2014  . BPH (benign prostatic hyperplasia) 07/12/2014  . Fall 07/12/2014  . Hypothyroidism 07/12/2014  . Colovesical fistula 06/11/2014  . Chronic atrial fibrillation ([place]) 01/14/2014  . Chronic combined systolic and diastolic congestive  heart failure ([place]) 01/14/2014  . Merkel cell carcinoma ([place]) 06/30/2013  . Dyslipidemia 05/23/2008  . Essential hypertension 05/23/2008     Palliative Care Assessment & Plan    1.Code Status:  DNR    Code Status Orders        Start     Ordered   03/04/15 2252  Do not attempt resuscitation (DNR)   Continuous    Question Answer Comment  In the event of cardiac or respiratory ARREST Do not call a "code blue"   In the event of cardiac or respiratory ARREST Do not perform Intubation, CPR, defibrillation or ACLS   In the event of cardiac or respiratory ARREST Use medication by any route, position, wound care, and other measures to relive pain and suffering. May use oxygen, suction and manual treatment of airway obstruction as needed for comfort.      03/04/15 2257    Code Status History    Date Active Date Inactive Code Status Order ID Comments User Context   02/19/2015  8:46 AM 02/25/2015  5:59 PM Full Code [id number]  Jonetta Osgood, MD Inpatient   02/17/2015  6:27 PM 02/19/2015  8:46 AM DNR [id number]  Lavina Hamman, MD ED   07/12/2014  5:02 PM 07/14/2014  3:27 PM Full Code [id number]  Robbie Lis, MD Inpatient   06/12/2014  1:12 PM 06/23/2014  3:25 PM Full Code [id number]  Armandina Gemma, MD Inpatient   04/09/2014 12:59 AM 04/11/2014  2:52 PM Full Code [id number]  Lavina Hamman, MD ED   01/14/2014 11:39 PM 01/19/2014  6:56 PM Full Code [id number]  Rise Patience, MD Inpatient    Advance Directive Documentation        Most Recent Value   Type of Advance Directive  Living will, Healthcare Power of Attorney   Pre-existing out of facility DNR order (yellow form or pink MOST form)     "MOST" Form in Place?         2. Goals of Care/Additional Recommendations:  Overall appears to be moving towards improvement  Still fragile, PMT will follow while inpatient  His goal is to go home, avoid hospitalization 3. Symptom Management:      1. Dyspnea: Reports improved whenever he takes oral  morphine. Encouraged him to continue to take on as-needed basis  4. Palliative Prophylaxis:   Aspiration, Bowel Regimen, Delirium Protocol and Frequent Pain Assessment  5. Prognosis: Unable to determine  6. Discharge Planning:  To be determined   Care plan was discussed with patient  Thank you for allowing the Palliative Medicine Team to assist in the care of this patient.   Time In:  11:40 Time Out: 11:55 Total Time 15 Prolonged Time Billed no        Acquanetta Chain, DO  03/10/2015, 11:50 AM  Please contact Palliative Medicine Team phone at [phone] for questions and concerns.

## 2015-03-11 ENCOUNTER — Inpatient Hospital Stay (HOSPITAL_COMMUNITY): Payer: Medicare Other

## 2015-03-11 LAB — BASIC METABOLIC PANEL
ANION GAP: 11 (ref 5–15)
BUN: 69 mg/dL — ABNORMAL HIGH (ref 6–20)
CALCIUM: 8.9 mg/dL (ref 8.9–10.3)
CHLORIDE: 96 mmol/L — AB (ref 101–111)
CO2: 21 mmol/L — AB (ref 22–32)
Creatinine, Ser: 2.51 mg/dL — ABNORMAL HIGH (ref 0.61–1.24)
GFR calc non Af Amer: 23 mL/min — ABNORMAL LOW (ref >60)
GFR, EST AFRICAN AMERICAN: 26 mL/min — AB (ref >60)
Glucose, Bld: 183 mg/dL — ABNORMAL HIGH (ref 65–99)
POTASSIUM: 5.4 mmol/L — AB (ref 3.5–5.1)
Sodium: 128 mmol/L — ABNORMAL LOW (ref 135–145)

## 2015-03-11 LAB — CBC
HEMATOCRIT: 27.8 % — AB (ref 39.0–52.0)
HEMOGLOBIN: 9.1 g/dL — AB (ref 13.0–17.0)
MCH: 32 pg (ref 26.0–34.0)
MCHC: 32.7 g/dL (ref 30.0–36.0)
MCV: 97.9 fL (ref 78.0–100.0)
Platelets: 310 10*3/uL (ref 150–400)
RBC: 2.84 MIL/uL — AB (ref 4.22–5.81)
RDW: 16.9 % — AB (ref 11.5–15.5)
WBC: 9.2 10*3/uL (ref 4.0–10.5)

## 2015-03-11 MED ORDER — METHYLPREDNISOLONE SODIUM SUCC 125 MG IJ SOLR
80.0000 mg | Freq: Four times a day (QID) | INTRAMUSCULAR | Status: DC
  Administered 2015-03-11 – 2015-03-13 (×10): 80 mg via INTRAVENOUS
  Filled 2015-03-11 (×13): qty 1.28

## 2015-03-11 MED ORDER — SODIUM POLYSTYRENE SULFONATE 15 GM/60ML PO SUSP
30.0000 g | Freq: Once | ORAL | Status: AC
  Administered 2015-03-11: 30 g via ORAL
  Filled 2015-03-11: qty 120

## 2015-03-11 MED ORDER — ZOLPIDEM TARTRATE 5 MG PO TABS
5.0000 mg | ORAL_TABLET | Freq: Once | ORAL | Status: AC
  Administered 2015-03-11: 5 mg via ORAL
  Filled 2015-03-11: qty 1

## 2015-03-11 NOTE — Care Management Note (Signed)
Case Management Note  Patient Details  Name: ADOLFO BRABAND MRN: [id number] Date of Birth: 1932/04/13  Subjective/Objective:0n 02 , iv abx. D/c plan SNF.                    Action/Plan:d/c SNF.   Expected Discharge Date:   (unknown)               Expected Discharge Plan:  Skilled Nursing Facility  In-House Referral:  Clinical Social Work  Discharge planning Services  CM Consult  Post Acute Care Choice:    Choice offered to:     DME Arranged:    DME Agency:     HH Arranged:    [place] Agency:     Status of Service:  In process, will continue to follow  Medicare Important Message Given:  Yes Date Medicare IM Given:    Medicare IM give by:    Date Additional Medicare IM Given:    Additional Medicare Important Message give by:     If discussed at [place] of Stay Meetings, dates discussed:    Additional Comments:  Dessa Phi, RN 03/11/2015, 2:49 PM

## 2015-03-11 NOTE — Progress Notes (Addendum)
Name: WESTEN CANCELLIERI MRN: [id number] DOB: [birth year]    ADMISSION DATE:  03/04/2015 CONSULTATION DATE:  2/20  REFERRING MD :  Doyle Askew   CHIEF COMPLAINT:  Hypoxia   BRIEF PATIENT DESCRIPTION:  82 year old male admitted from SNF w/ working dx of decompensated HF and HCAp on 2/16. PCCM asked to see on 2/20 as he continued to have high O2 needs. He appears to have a rapidly progressive pulmonary fibrosis that do not have features of usual interstitial pneumonitis. There is suspicion for Macrobid related lung toxicity.  SIGNIFICANT EVENTS  03/08/2015 started on high-dose steroids  STUDIES:  U strep and legionella antigen 2/17: neg  Serial PCTs from admit to 2/20: negative  Sed rate 2/20>>> CT chest 2/18: CT scan of the abdomen pelvis shows mild interstitial change at the lung bases. Today's study shows much more pronounced interstitial opacification, along with small bilateral pleural effusions. There is cardio megaly with mild venous congestion. This study confirms significant acute interstitial process superimposed on mild chronic interstitial lung disease. The findings would suggest the possibility of congestive heart failure with interstitial edema, but clinical worsening despite diuresis indicates that the interstitial infiltrates seen bilaterally may be due to other etiologies including atypical pneumonia. .  SUBJECTIVE:  Feels better Didn't get up to chair Minimal cough  VITAL SIGNS: Temp:  [97.3 F (36.3 C)-97.5 F (36.4 C)] 97.5 F (36.4 C) (02/23 0514) Pulse Rate:  [67-83] 83 (02/23 0514) Resp:  [16-20] 20 (02/23 0514) BP: (111-125)/(50-75) 120/75 mmHg (02/23 0514) SpO2:  [95 %-97 %] 95 % (02/23 0514) Weight:  [72 kg (158 lb 11.7 oz)] 72 kg (158 lb 11.7 oz) (02/23 0514) 100% NRB PHYSICAL EXAMINATION:  Gen: chronically ill appearing HENT: OP clear, TM's clear, neck supple PULM: few crackles bases but otherwise suprisingly clear again today CV: Irreg irreg, no mgr,  trace edema GI: BS+, soft, nontender Derm: no cyanosis or rash Psyche: normal mood and affect    Recent Labs Lab 03/09/15 1213 03/10/15 0521 03/11/15 0506  NA 128* 127* 128*  K 6.0* 5.5* 5.4*  CL 97* 96* 96*  CO2 22 19* 21*  BUN 54* 65* 69*  CREATININE 2.47* 2.58* 2.51*  GLUCOSE 217* 181* 183*    Recent Labs Lab 03/09/15 0536 03/10/15 0521 03/11/15 0506  HGB 9.6* 9.3* 9.1*  HCT 29.9* 29.1* 27.8*  WBC 5.4 9.5 9.2  PLT 346 349 310   No results found.   CXR from today images reviewed> persistent interstitial infiltrate but appears improved compared to last film  RA factor elevated, all other autoimmune labs sent are negative  ASSESSMENT / PLAN:  Acute Hypoxic Respiratory failure in the setting of diffuse pulmonary infiltrates> this is a rapidly progressive pneumonitis. See my discussion in previous notes.  Fortunately today (2/23) his CXR seems a bit better and he feels better.    Elevated Rheumatoid factor> I'm not sure what to make of this, it is commonly falsely elevated in the elderly.  If he improves and comes off steroids would send anti-CCP antibody but there is no utility in doing that while on steroids.  Plan Will stop antibiotics Continue Solumedrol but decrease dose to 60mg  IV q6h Wean O2 for O2 saturation > 90% Incentive spirometry Out of bed > important, I discussed with nurse today   Afib CHF (Chronic systolic HF) now compensated Demand ischemia  Plan Cont rate control and xarelto  Per cardiology   All other issues per TRH  Global: overall prognosis  seems poor given his profound deconditioning but I think his CXR is a bit better and he feels better.  So while I agree with not taking an aggressive approach with life support, etc, we will proceed with cautious optimism and of course more steroids.  Wife updated by phone by me today.  Discussed with Dr. Milderd Meager, MD [place] PCCM Pager: [phone] Cell: [phone] After 3pm or  if no response, call [phone]

## 2015-03-11 NOTE — Progress Notes (Signed)
Assisted pt OOB to chair with assistance of NT Marissa and SN Kendra. Pt's O2 sat dropped to 76% while ambulating to chair. Non-rebreather mask placed on pt @ 15L/min. O2 sat increased to 92%.  Lind Guest, RN

## 2015-03-11 NOTE — Progress Notes (Signed)
Patient ID: NATAVION DUNNETT, male   DOB: May 22, 1932, 82 y.o.   MRN: [id number]  [facility] PROGRESS NOTE  HO HERGENREDER [id number] DOB: [birth year] DOA: 03/04/2015 PCP: London Pepper, MD   Brief narrative:    82 y.o. male with PMH of chronic atrial fibrillation on Xarelto, chronic systolic CHF (EF [id number]), hypertension, and lumbar spinal stenosis who presented from his SNF for evaluation of acute hypoxic respiratory failure. Patient was admitted to [facility] from 02/17/2015 - 02/25/2015, during which time he was aggressively diuresed and treated empirically for CAP with azithromycin and Rocephin. His symptoms have gotten worse in the past 24 hours, was dyspneic at rest and with minimal exertional.  In ED, patient was found to be afebrile, saturating in the 80s on 4 L/m supplemental oxygen, and with remaining vital signs stable. A chest x-ray demonstrated diffuse bilateral patchy opacities concerning for pneumonia, with edema less likely. EKG featured atrial fibrillation with right bundle branch block and borderline ST depression in leads V4 to V6. Chem panel notable for a mild hyponatremia, serum bicarbonate of 19, and serum creatinine of 1.79 which is up from his baseline of 1, but improving from time of recent discharge. BNP returned elevated to 1500 and troponin is also elevated to 0.75. CBC is unremarkable. In the emergency department, the patient was given a 40 mg IV push of Lasix and treated with empiric vancomycin and cefepime. His O2 sat duration and improved to the 90s on oxi-mask, he remained hemodynamically stable.  Major events since admission: 2/18 - still on NRB, unable to wean off, CT chest requested  2/19 - added doxycycline for atypical coverage, also added Lasix 40 mg in addition to QD dosing for better diuresis  2/21 - off NRB, transitioned to [place]  Assessment/Plan:    1. Acute on chronic hypoxic respiratory failure  - per PCCM ? Viral pneumonitis vs drug induced  toxicity - thankfully able to transition to [place] even though still at 10 L but this is some progresses since admission  - abx discontinued  - no signs of volume overload this AM, lasix was stopped per cardiology 2/21 - appreciate PCCM and cardiologist assistance   2. Acute on chronic systolic CHF  - TTE (123456) EF 40-45%, mild LVH, severe LA dilation, mild-moderately elevated PA pressures  - BNP 1500 on arrival, was 820 on 02/17/15  - Diuresed with IV Lasix, resolved - weight dwon since admission 164 lbs --> 158 lbs --> 156 lbs --> 160 lbs this AM - monitor strict I/Os, daily wts, SLIV, fluid-restrict diet  - Midodrine 10 mg TID for BP support during diuresis  - Continue ASA 81, Zetia, resume beta-blocker  - has been intolerant to statins and ACE/ARB precluded by CKD - hold Lasix for now, on hold since 2/21  3. HCAP, diffuse, bilateral lobes  - CXR findings c/w PNA and per CT chest ? Atypical PNA - stopped ABX today   4. Acute kidney injury with hyperkalemia  - SCr 1.8 on arrival, up from an apparent baseline of 1 - Cr slightly better this AM - Avoid nephrotoxins where feasible  - give one dose of kayexalate again this AM - repeat BMP in AM  5. Chronic atrial fibrillation  - LA severely dilated on TTE, suggesting that this will be permanent  - Continue AC with Xarelto  - Rate-controlled effectively with Toprol XL  6. Hyponatremia  - same in the past 24 hours  - BMP in AM  7. Hypothyroidism -  Continue current home-dose Synthroid   8. BPH  - Continue Flomax, finasteride  - Foley in place currently, will d/c as able   9. Elevated cardiac biomarker - Troponin I elevated to 0.75 on admission, repeat troponin relatively unchanged - pt very clear he does not want any invasive interventions, confirms DNR code status  - no further interventions needs per cardiology team   DVT prophylaxis - on full AC Xarelto   Code Status: DNR Family Communication:  plan of care  discussed with the patient and wife at bedside  Disposition Plan: SNF by 2/25  IV access:  Peripheral IV  Procedures and diagnostic studies:    Dg Chest 2 View 03/04/2015 Diffuse lung opacity with patchy appearance favoring pneumonia over edema. 2. Suspect underlying interstitial lung disease.  Medical Consultants:  PCCM PCT Cardiology   Other Consultants:  None  IAnti-Infectives:   Vancomycin 2/16 --> 2/19 Maxipime 2/16 --> 2/23 Doxycycline 2/19 --> 2/23  [name], MD  [facility] Pager [phone]  If 7PM-7AM, please contact night-coverage [email or web address] Password [facility] 03/11/2015, 2:13 PM   LOS: 7 days   HPI/Subjective: No events overnight. Somewhat better this AM, on [place].   Objective: Filed Vitals:   03/10/15 1335 03/10/15 2052 03/11/15 0514 03/11/15 1049  BP: 111/50 125/59 120/75 116/54  Pulse: 73 67 83 72  Temp: 97.3 F (36.3 C) 97.4 F (36.3 C) 97.5 F (36.4 C)   TempSrc: Oral Oral Oral   Resp: 16 20 20    Height:      Weight:   72 kg (158 lb 11.7 oz)   SpO2: 96% 97% 95%     Intake/Output Summary (Last 24 hours) at 03/11/15 1413 Last data filed at 03/11/15 1148  Gross per 24 hour  Intake    608 ml  Output   1345 ml  Net   -737 ml    Exam:   General:  Pt is alert, follows commands appropriately, not in acute distress  Cardiovascular: Irregular rate and rhythm, no rubs, no gallops  Respiratory: better air movement bilaterally, diminished at bases   Abdomen: Soft, non tender, non distended, bowel sounds present, no guarding  Extremities: pulses DP and PT palpable bilaterally  Neuro: Grossly nonfocal  Data Reviewed: Basic Metabolic Panel:  Recent Labs Lab 03/06/15 0553  03/08/15 0538 03/09/15 0536 03/09/15 1213 03/10/15 0521 03/11/15 0506  NA 136  < > 130* 130* 128* 127* 128*  K 4.2  < > 5.1 6.0* 6.0* 5.5* 5.4*  CL 105  < > 98* 97* 97* 96* 96*  CO2 22  < > 23 22 22  19* 21*  GLUCOSE 115*  < > 94 177* 217* 181* 183*  BUN 34*  < >  41* 50* 54* 65* 69*  CREATININE 1.95*  < > 2.14* 2.51* 2.47* 2.58* 2.51*  CALCIUM 8.3*  < > 8.4* 9.0 9.0 9.0 8.9  MG 1.8  --   --   --   --   --   --   < > = values in this interval not displayed. CBC:  Recent Labs Lab 03/05/15 0147  03/07/15 0609 03/08/15 0538 03/09/15 0536 03/10/15 0521 03/11/15 0506  WBC 11.3*  < > 15.0* 13.1* 5.4 9.5 9.2  NEUTROABS 7.9*  --   --   --   --   --   --   HGB 9.8*  < > 9.6* 10.0* 9.6* 9.3* 9.1*  HCT 29.8*  < > 29.8* 31.1* 29.9* 29.1* 27.8*  MCV 100.3*  < >  100.7* 101.0* 99.7 98.6 97.9  PLT 376  < > 328 358 346 349 310  < > = values in this interval not displayed. Recent Results (from the past 240 hour(s))  MRSA PCR Screening     Status: None   Collection Time: 03/05/15  9:39 AM  Result Value Ref Range Status   MRSA by PCR NEGATIVE NEGATIVE Final    Comment:        The GeneXpert MRSA Assay (FDA approved for NASAL specimens only), is one component of a comprehensive MRSA colonization surveillance program. It is not intended to diagnose MRSA infection nor to guide or monitor treatment for MRSA infections.      Scheduled Meds: . aspirin EC  81 mg Oral Daily  . ezetimibe  10 mg Oral q morning - 10a  . feeding supplement (ENSURE ENLIVE)  237 mL Oral BID BM  . finasteride  5 mg Oral Daily  . levothyroxine  125 mcg Oral QAC breakfast  . methylPREDNISolone (SOLU-MEDROL) injection  80 mg Intravenous Q6H  . metoprolol succinate  12.5 mg Oral BID  . midodrine  10 mg Oral TID WC  . nystatin-triamcinolone  1 application Topical TID  . Rivaroxaban  15 mg Oral q1800  . saccharomyces boulardii  250 mg Oral q morning - 10a  . sertraline  50 mg Oral Daily  . sodium chloride flush  3 mL Intravenous Q12H  . tamsulosin  0.4 mg Oral Daily   Continuous Infusions:

## 2015-03-12 ENCOUNTER — Inpatient Hospital Stay (HOSPITAL_COMMUNITY): Payer: Medicare Other

## 2015-03-12 LAB — CBC
HCT: 27.9 % — ABNORMAL LOW (ref 39.0–52.0)
Hemoglobin: 9.1 g/dL — ABNORMAL LOW (ref 13.0–17.0)
MCH: 31.9 pg (ref 26.0–34.0)
MCHC: 32.6 g/dL (ref 30.0–36.0)
MCV: 97.9 fL (ref 78.0–100.0)
PLATELETS: 314 10*3/uL (ref 150–400)
RBC: 2.85 MIL/uL — ABNORMAL LOW (ref 4.22–5.81)
RDW: 16.9 % — ABNORMAL HIGH (ref 11.5–15.5)
WBC: 9.4 10*3/uL (ref 4.0–10.5)

## 2015-03-12 LAB — BASIC METABOLIC PANEL
Anion gap: 14 (ref 5–15)
BUN: 77 mg/dL — AB (ref 6–20)
CALCIUM: 9.3 mg/dL (ref 8.9–10.3)
CO2: 21 mmol/L — ABNORMAL LOW (ref 22–32)
CREATININE: 2.62 mg/dL — AB (ref 0.61–1.24)
Chloride: 104 mmol/L (ref 101–111)
GFR calc Af Amer: 25 mL/min — ABNORMAL LOW (ref >60)
GFR, EST NON AFRICAN AMERICAN: 22 mL/min — AB (ref >60)
Glucose, Bld: 176 mg/dL — ABNORMAL HIGH (ref 65–99)
Potassium: 4.4 mmol/L (ref 3.5–5.1)
SODIUM: 139 mmol/L (ref 135–145)

## 2015-03-12 MED ORDER — PHENAZOPYRIDINE HCL 200 MG PO TABS
200.0000 mg | ORAL_TABLET | Freq: Three times a day (TID) | ORAL | Status: DC
  Administered 2015-03-12 – 2015-03-13 (×5): 200 mg via ORAL
  Filled 2015-03-12 (×7): qty 1

## 2015-03-12 MED ORDER — LORAZEPAM 2 MG/ML IJ SOLN
0.5000 mg | INTRAMUSCULAR | Status: DC | PRN
  Administered 2015-03-12: 0.5 mg via INTRAVENOUS
  Filled 2015-03-12: qty 1

## 2015-03-12 MED ORDER — TAMSULOSIN HCL 0.4 MG PO CAPS
0.4000 mg | ORAL_CAPSULE | Freq: Once | ORAL | Status: AC
  Administered 2015-03-12: 0.4 mg via ORAL
  Filled 2015-03-12: qty 1

## 2015-03-12 MED ORDER — SODIUM CHLORIDE 0.9 % IV SOLN
INTRAVENOUS | Status: DC
  Administered 2015-03-12 – 2015-03-13 (×2): via INTRAVENOUS

## 2015-03-12 MED ORDER — IOHEXOL 300 MG/ML  SOLN
25.0000 mL | INTRAMUSCULAR | Status: AC
  Administered 2015-03-12 (×2): 25 mL via ORAL

## 2015-03-12 MED ORDER — FENTANYL CITRATE (PF) 100 MCG/2ML IJ SOLN
25.0000 ug | INTRAMUSCULAR | Status: DC | PRN
  Administered 2015-03-12 (×2): 25 ug via INTRAVENOUS
  Filled 2015-03-12 (×2): qty 2

## 2015-03-12 MED ORDER — HYDROMORPHONE HCL 1 MG/ML IJ SOLN
1.0000 mg | INTRAMUSCULAR | Status: DC | PRN
  Administered 2015-03-12 – 2015-03-17 (×4): 1 mg via INTRAVENOUS
  Filled 2015-03-12 (×4): qty 1

## 2015-03-12 MED ORDER — TAMSULOSIN HCL 0.4 MG PO CAPS
0.8000 mg | ORAL_CAPSULE | Freq: Every day | ORAL | Status: DC
  Administered 2015-03-13 – 2015-03-17 (×5): 0.8 mg via ORAL
  Filled 2015-03-12 (×7): qty 2

## 2015-03-12 MED ORDER — LIDOCAINE HCL 2 % EX GEL
1.0000 "application " | Freq: Once | CUTANEOUS | Status: DC
  Filled 2015-03-12: qty 5

## 2015-03-12 MED ORDER — ACETAMINOPHEN 500 MG PO TABS
1000.0000 mg | ORAL_TABLET | Freq: Three times a day (TID) | ORAL | Status: DC
  Administered 2015-03-12 – 2015-03-17 (×15): 1000 mg via ORAL
  Filled 2015-03-12 (×20): qty 2

## 2015-03-12 MED ORDER — MIRABEGRON ER 25 MG PO TB24
25.0000 mg | ORAL_TABLET | Freq: Every day | ORAL | Status: DC
  Administered 2015-03-12 – 2015-03-17 (×6): 25 mg via ORAL
  Filled 2015-03-12 (×7): qty 1

## 2015-03-12 MED ORDER — FENTANYL CITRATE (PF) 100 MCG/2ML IJ SOLN
12.5000 ug | INTRAMUSCULAR | Status: DC | PRN
  Administered 2015-03-12 (×2): 12.5 ug via INTRAVENOUS
  Filled 2015-03-12 (×2): qty 2

## 2015-03-12 NOTE — Progress Notes (Addendum)
Palliative Follow-up  Harold Jennings unfortunately looks worse today. He is slightly more confused and also appears to be having severe bladder spasms and pain related to foley catheter removal yesterday along with IO cath X2 last PM. He has significantly decreased UOP, worsening renal function and bladder scan only reveals a small amount of residual urine. He is -9774ml-I suspect he is volume depleted. Pulmonary issues are improved from admission-he still however had O2 desaturations with activity. Reviewed CXR with possible pneumoperitoneum. Very concerning with worsening abdominal pain.  Symptom Management:  1. Bladder Spasms:  Scheduled TID Pyridium  Mirbegtron for bladder spasms  Increased Flomax + Finesteride  Unless Urinary retention is confirmed do not replace foley-trauma from IO possible.  Renal US to check for [facility]- but given possible pneumoperitoneum may need to get CT  2. Volume/Worsening Renal Failure  I will start him on 75 cc/hr for now-he clinically looks very dry  Will call attending to discuss medical mangement/work-up  3. Pneumonitis- viral vs. Macrobid tox. Acute and rapidly progressive-steroid responsive for now.  He has been declining in health for the past month- we discussed how serious things could be right now and that he remains fragile.  Discussed with patient, his wife, and Therapist, sports.  Time: 1:130-205PM Total Time: 35 minutes Greater than 50%  of this time was spent counseling and coordinating care related to the above assessment and plan.   Lane Hacker, DO Palliative Medicine [phone]

## 2015-03-12 NOTE — Progress Notes (Addendum)
Patient ID: Harold Jennings, male   DOB: 13-Nov-1932, 82 y.o.   MRN: [id number]  [facility] PROGRESS NOTE  URA SLAIGHT [id number] DOB: 06/27/1932 DOA: 03/04/2015 PCP: London Pepper, MD   Brief narrative:    82 y.o. male with PMH of chronic atrial fibrillation on Xarelto, chronic systolic CHF (EF A999333), hypertension, and lumbar spinal stenosis who presented from his SNF for evaluation of acute hypoxic respiratory failure. Patient was admitted to [facility] from 02/17/2015 - 02/25/2015, during which time he was aggressively diuresed and treated empirically for CAP with azithromycin and Rocephin. His symptoms have gotten worse in the past 24 hours, was dyspneic at rest and with minimal exertional.  In ED, patient was found to be afebrile, saturating in the 80s on 4 L/m supplemental oxygen, and with remaining vital signs stable. A chest x-ray demonstrated diffuse bilateral patchy opacities concerning for pneumonia, with edema less likely. EKG featured atrial fibrillation with right bundle branch block and borderline ST depression in leads V4 to V6. Chem panel notable for a mild hyponatremia, serum bicarbonate of 19, and serum creatinine of 1.79 which is up from his baseline of 1, but improving from time of recent discharge. BNP returned elevated to 1500 and troponin is also elevated to 0.75. CBC is unremarkable. In the emergency department, the patient was given a 40 mg IV push of Lasix and treated with empiric vancomycin and cefepime. His O2 sat duration and improved to the 90s on oxi-mask, he remained hemodynamically stable.  Major events since admission: 2/18 - still on NRB, unable to wean off, CT chest requested  2/19 - added doxycycline for atypical coverage, also added Lasix 40 mg in addition to QD dosing for better diuresis  2/21 - off NRB, transitioned to [place]  Assessment/Plan:    1. Acute on chronic hypoxic respiratory failure  - ? iral pneumonitis vs drug induced toxicity - has  been off ABX since 2/23, still on [place] 10 L - tried ambulating yesterday, still weak and desats quickly down to 70's with exertion  - no signs of volume overload this AM, lasix was stopped per cardiology 2/21 - appreciate PCCM and cardiologist assistance   2. Acute on chronic systolic CHF  - TTE (123456) EF 40-45%, mild LVH, severe LA dilation, mild-moderately elevated PA pressures  - BNP 1500 on arrival, was 820 on 02/17/15  - Diuresed with IV Lasix, resolved - weight dwon since admission 164 lbs --> 158 lbs --> 156 lbs --> 160 lbs this AM - Midodrine 10 mg TID for BP support during diuresis  - Continue ASA 81, Zetia, resume beta-blocker  - has been intolerant to statins and ACE/ARB precluded by CKD - hold Lasix for now, on hold since 2/21  3. HCAP, diffuse, bilateral lobes  - completed course of ABX  4. Acute kidney injury with hyperkalemia  - SCr 1.8 on arrival, up from an apparent baseline of 1 - Cr up this AM and suspect component of urinary retention contributing, will need to place foley  - K WNL  - renal US pending  - repeat BMP in AM  5. Chronic atrial fibrillation  - LA severely dilated on TTE, suggesting that this will be permanent  - Continue AC with Xarelto  - Rate-controlled effectively with Toprol XL  6. Hyponatremia  - resolved   7. Hypothyroidism - Continue current home-dose Synthroid   8. BPH  - Continue Flomax, finasteride  - place foley   9. Elevated cardiac biomarker -  no further interventions needs per cardiology team   DVT prophylaxis - on full AC Xarelto   Code Status: DNR Family Communication:  plan of care discussed with the patient and wife at bedside  Disposition Plan: SNF by 2/27  IV access:  Peripheral IV  Procedures and diagnostic studies:    Dg Chest 2 View 03/04/2015 Diffuse lung opacity with patchy appearance favoring pneumonia over edema. 2. Suspect underlying interstitial lung disease.  Medical Consultants:   PCCM PCT Cardiology   Other Consultants:  None  IAnti-Infectives:   Vancomycin 2/16 --> 2/19 Maxipime 2/16 --> 2/23 Doxycycline 2/19 --> 2/23  [name], MD  [facility] Pager [phone]  If 7PM-7AM, please contact night-coverage [email or web address] Password [facility] 03/12/2015, 12:10 PM   LOS: 8 days   HPI/Subjective: No events overnight. Somewhat better this AM, on [name].   Objective: Filed Vitals:   03/12/15 0337 03/12/15 0903 03/12/15 1026 03/12/15 1118  BP: 137/70 144/64 111/50 111/55  Pulse: 99   83  Temp: 98.7 F (37.1 C)     TempSrc: Oral     Resp: 20     Height:      Weight: 72.2 kg (159 lb 2.8 oz)     SpO2: 96%       Intake/Output Summary (Last 24 hours) at 03/12/15 1210 Last data filed at 03/12/15 0415  Gross per 24 hour  Intake      0 ml  Output   1100 ml  Net  -1100 ml    Exam:   General:  Pt is alert, follows commands appropriately, not in acute distress  Cardiovascular: Irregular rate and rhythm, no rubs, no gallops  Respiratory: better air movement bilaterally, diminished at bases   Abdomen: Soft, non tender, non distended, bowel sounds present, no guarding  Extremities: pulses DP and PT palpable bilaterally  Neuro: Grossly nonfocal  Data Reviewed: Basic Metabolic Panel:  Recent Labs Lab 03/06/15 0553  03/09/15 0536 03/09/15 1213 03/10/15 0521 03/11/15 0506 03/12/15 0518  NA 136  < > 130* 128* 127* 128* 139  K 4.2  < > 6.0* 6.0* 5.5* 5.4* 4.4  CL 105  < > 97* 97* 96* 96* 104  CO2 22  < > 22 22 19* 21* 21*  GLUCOSE 115*  < > 177* 217* 181* 183* 176*  BUN 34*  < > 50* 54* 65* 69* 77*  CREATININE 1.95*  < > 2.51* 2.47* 2.58* 2.51* 2.62*  CALCIUM 8.3*  < > 9.0 9.0 9.0 8.9 9.3  MG 1.8  --   --   --   --   --   --   < > = values in this interval not displayed. CBC:  Recent Labs Lab 03/08/15 0538 03/09/15 0536 03/10/15 0521 03/11/15 0506 03/12/15 0518  WBC 13.1* 5.4 9.5 9.2 9.4  HGB 10.0* 9.6* 9.3* 9.1* 9.1*  HCT 31.1* 29.9*  29.1* 27.8* 27.9*  MCV 101.0* 99.7 98.6 97.9 97.9  PLT 358 346 349 310 314   Recent Results (from the past 240 hour(s))  MRSA PCR Screening     Status: None   Collection Time: 03/05/15  9:39 AM  Result Value Ref Range Status   MRSA by PCR NEGATIVE NEGATIVE Final    Comment:        The GeneXpert MRSA Assay (FDA approved for NASAL specimens only), is one component of a comprehensive MRSA colonization surveillance program. It is not intended to diagnose MRSA infection nor to guide or monitor treatment for MRSA infections.  Scheduled Meds: . acetaminophen  1,000 mg Oral TID  . aspirin EC  81 mg Oral Daily  . feeding supplement (ENSURE ENLIVE)  237 mL Oral BID BM  . finasteride  5 mg Oral Daily  . levothyroxine  125 mcg Oral QAC breakfast  . lidocaine  1 application Urethral Once  . methylPREDNISolone (SOLU-MEDROL) injection  80 mg Intravenous Q6H  . metoprolol succinate  12.5 mg Oral BID  . midodrine  10 mg Oral TID WC  . mirabegron ER  25 mg Oral Daily  . nystatin-triamcinolone  1 application Topical TID  . phenazopyridine  200 mg Oral TID WC  . Rivaroxaban  15 mg Oral q1800  . saccharomyces boulardii  250 mg Oral q morning - 10a  . sertraline  50 mg Oral Daily  . sodium chloride flush  3 mL Intravenous Q12H  . tamsulosin  0.4 mg Oral Once  . tamsulosin  0.8 mg Oral Daily   Continuous Infusions:

## 2015-03-12 NOTE — Evaluation (Signed)
Physical Therapy Evaluation Patient Details Name: Harold Jennings MRN: [id number] DOB: 1932/08/14 Today's Date: 03/12/2015   History of Present Illness  82 y.o. male with PMH of chronic atrial fibrillation on Xarelto, chronic systolic CHF (EF [id number]), hypertension, and lumbar spinal stenosis who presented from his SNF for evaluation of acute hypoxic respiratory failure.  Clinical Impression  Pt admitted with above diagnosis. Pt currently with functional limitations due to the deficits listed below (see PT Problem List).   Pt will benefit from skilled PT to increase their independence and safety with mobility to allow discharge to the venue listed below.  Pt assisted OOB to chair and limited by fatigue and DOE.  Recommend return to SNF for rehab.     Follow Up Recommendations SNF;Supervision/Assistance - 24 hour    Equipment Recommendations  None recommended by PT (TBA next venue)    Recommendations for Other Services       Precautions / Restrictions Precautions Precautions: Fall Precaution Comments: monitor vitals      Mobility  Bed Mobility Overal bed mobility: Needs Assistance Bed Mobility: Rolling;Sidelying to Sit Rolling: Min assist Sidelying to sit: Min assist       General bed mobility comments: assist for trunk, pt was on bedpan, RN assisted with hygiene, SpO2 89% on 10L O2 [name] upon sitting EOB  Transfers Overall transfer level: Needs assistance Equipment used: Rolling walker (2 wheeled) Transfers: Sit to/from [facility] Sit to Stand: Min assist Stand pivot transfers: Min assist       General transfer comment: assist to rise and steady, assist for stability with transfer, verbal cues for technique and posture, pt tends to keep trunk in increased flexion and forward head posture, proactively used NRB for transfer with SPO2 84%, increased time to recover after transfer however was able to transition back to 10L O2 [name] at 92%  Ambulation/Gait                 Stairs            Wheelchair Mobility    Modified Rankin (Stroke Patients Only)       Balance   Sitting-balance support: Bilateral upper extremity supported;Feet supported Sitting balance-[name] Scale: Fair     Standing balance support: Bilateral upper extremity supported Standing balance-[name] Scale: Poor                               Pertinent Vitals/Pain Pain Assessment: No/denies pain    Home Living Family/patient expects to be discharged to:: Private residence Living Arrangements: Spouse/significant other Available Help at Discharge: Family Type of Home: House Home Access: Stairs to enter Entrance Stairs-Rails: Psychiatric nurse of Steps: 3 Home Layout: Able to live on main level with bedroom/bathroom;Two level Home Equipment: Walker - 4 wheels;Cane - single point;Bedside commode;Grab bars - tub/shower;Grab bars - toilet;Shower seat - built in Additional Comments: per recent [facility] admission, plans to d/c back to SNF    Prior Function Level of Independence: Independent with assistive device(s)         Comments: uses cane or rollator, assist for shower; wife assists on one side up steps prior to hopsitalizations     Hand Dominance        Extremity/Trunk Assessment   Upper Extremity Assessment: Overall WFL for tasks assessed           Lower Extremity Assessment: Generalized weakness         Communication  Communication: No difficulties  Cognition Arousal/Alertness: Awake/alert Behavior During Therapy: WFL for tasks assessed/performed Overall Cognitive Status: Within Functional Limits for tasks assessed                      General Comments      Exercises        Assessment/Plan    PT Assessment Patient needs continued PT services  PT Diagnosis Difficulty walking;Generalized weakness   PT Problem List Decreased strength;Decreased activity tolerance;Decreased balance;Decreased  mobility;Cardiopulmonary status limiting activity  PT Treatment Interventions DME instruction;Balance training;Gait training;Stair training;Functional mobility training;Patient/family education;Therapeutic activities;Therapeutic exercise   PT Goals (Current goals can be found in the Care Plan section) Acute Rehab PT Goals PT Goal Formulation: With patient/family Time For Goal Achievement: 03/19/15 Potential to Achieve Goals: Good    Frequency Min 3X/week   Barriers to discharge        Co-evaluation               End of Session Equipment Utilized During Treatment: Gait belt;Oxygen Activity Tolerance: Patient limited by fatigue Patient left: in chair;with call bell/phone within reach;with family/visitor present Nurse Communication:  (RN in room for session)         Time: [phone] PT Time Calculation (min) (ACUTE ONLY): 28 min   Charges:   PT Evaluation $PT Eval Moderate Complexity: 1 Procedure     PT G Codes:        Harold Jennings,[name] 03/12/2015, 12:08 PM Carmelia Bake, PT, DPT 03/12/2015 Pager: [phone]

## 2015-03-12 NOTE — Care Management Important Message (Signed)
Important Message  Patient Details Important Message  Patient Details IM Letter given to Kathy/Case Manager to present to Patient Name: KARLAN MASTALSKI MRN: [id number] Date of Birth: 08-Jul-1932   Medicare Important Message Given:  Yes    Camillo Flaming 03/12/2015, 12:59 PM Name: SLAYTER DATA MRN: [id number] Date of Birth: February 10, 1932   Medicare Important Message Given:  Yes    Camillo Flaming 03/12/2015, 12:59 PM

## 2015-03-12 NOTE — Progress Notes (Signed)
Name: Harold Jennings MRN: [id number] DOB: 14-Apr-1932    ADMISSION DATE:  03/04/2015 CONSULTATION DATE:  2/20  REFERRING MD :  Doyle Askew   CHIEF COMPLAINT:  Hypoxia   BRIEF PATIENT DESCRIPTION:  82 year old male admitted from SNF w/ working dx of decompensated HF and HCAp on 2/16. PCCM asked to see on 2/20 as he continued to have high O2 needs. He appears to have a rapidly progressive pulmonary fibrosis that do not have features of usual interstitial pneumonitis. There is suspicion for Macrobid related lung toxicity.  SIGNIFICANT EVENTS  03/08/2015 started on high-dose steroids  STUDIES:  U strep and legionella antigen 2/17: neg  Serial PCTs from admit to 2/20: negative  Sed rate 2/20>>> CT chest 2/18: CT scan of the abdomen pelvis shows mild interstitial change at the lung bases. Today's study shows much more pronounced interstitial opacification, along with small bilateral pleural effusions. There is cardio megaly with mild venous congestion. This study confirms significant acute interstitial process superimposed on mild chronic interstitial lung disease. The findings would suggest the possibility of congestive heart failure with interstitial edema, but clinical worsening despite diuresis indicates that the interstitial infiltrates seen bilaterally may be due to other etiologies including atypical pneumonia. .  SUBJECTIVE:  Was up to chair yesterday O2 requirements improving  VITAL SIGNS: Temp:  [97.3 F (36.3 C)-98.7 F (37.1 C)] 97.3 F (36.3 C) (02/24 1315) Pulse Rate:  [83-99] 96 (02/24 1315) Resp:  [19-24] 19 (02/24 1315) BP: (110-144)/(49-78) 139/63 mmHg (02/24 1315) SpO2:  [93 %-97 %] 97 % (02/24 1315) Weight:  [159 lb 2.8 oz (72.2 kg)] 159 lb 2.8 oz (72.2 kg) (02/24 0337) 100% NRB PHYSICAL EXAMINATION:  Gen: chronically ill appearing HENT: OP clear, TM's clear, neck supple PULM: few crackles bases but otherwise suprisingly clear again today CV: Irreg irreg, no mgr,  trace edema GI: BS+, soft, nontender Derm: no cyanosis or rash Psyche: normal mood and affect    Recent Labs Lab 03/10/15 0521 03/11/15 0506 03/12/15 0518  NA 127* 128* 139  K 5.5* 5.4* 4.4  CL 96* 96* 104  CO2 19* 21* 21*  BUN 65* 69* 77*  CREATININE 2.58* 2.51* 2.62*  GLUCOSE 181* 183* 176*    Recent Labs Lab 03/10/15 0521 03/11/15 0506 03/12/15 0518  HGB 9.3* 9.1* 9.1*  HCT 29.1* 27.8* 27.9*  WBC 9.5 9.2 9.4  PLT 349 310 314   Dg Chest 2 View  03/11/2015  CLINICAL DATA:  82 year old male with shortness of breath, congestion and cough. EXAM: CHEST  2 VIEW COMPARISON:  03/04/2015 and prior radiographs.  03/06/2015 chest CT. FINDINGS: Cardiomegaly again noted. Linear lucency overlying the right lower hemi thorax many represent pneumoperitoneum. Scattered interstitial and mild airspace opacities within both lungs have decreased since 03/04/2015. There is no evidence of pneumothorax. There may be trace bilateral pleural effusions present. IMPRESSION: Possible right pneumoperitoneum. CT evaluation is recommended as clinically indicated. Decreased interstitial and airspace opacities within both lungs. Electronically Signed   By: Margarette Canada M.D.   On: 03/11/2015 10:30     RA factor elevated, all other autoimmune labs sent are negative  ASSESSMENT / PLAN:  Acute Hypoxic Respiratory failure in the setting of diffuse pulmonary infiltrates> this is a rapidly progressive pneumonitis. See my discussion in previous notes.  Fortunately his oxygenation continues to improve with steroids.  Elevated Rheumatoid factor> I'm not sure what to make of this, it is commonly falsely elevated in the elderly.  If he improves  and comes off steroids would send anti-CCP antibody but there is no utility in doing that while on steroids.  Plan Continue Solumedrol  60mg  IV q6h through the weekend Wean O2 for O2 saturation > 90% Incentive spirometry Out of bed > important   Afib CHF (Chronic  systolic HF) now compensated Demand ischemia  Plan Cont rate control and xarelto  Per cardiology   All other issues per [facility]  Global: overall prognosis seems poor given his profound deconditioning but I think his CXR is a bit better and he feels better.  So while I agree with not taking an aggressive approach with life support, etc, we will proceed with cautious optimism and of course more steroids.  Wife updated in person by me today    Roselie Awkward, MD [facility] Pager: [phone] Cell: [phone] After 3pm or if no response, call [phone]

## 2015-03-13 LAB — BASIC METABOLIC PANEL
Anion gap: 12 (ref 5–15)
BUN: 79 mg/dL — AB (ref 6–20)
CALCIUM: 8.6 mg/dL — AB (ref 8.9–10.3)
CHLORIDE: 102 mmol/L (ref 101–111)
CO2: 22 mmol/L (ref 22–32)
CREATININE: 2.46 mg/dL — AB (ref 0.61–1.24)
GFR calc Af Amer: 27 mL/min — ABNORMAL LOW (ref >60)
GFR calc non Af Amer: 23 mL/min — ABNORMAL LOW (ref >60)
GLUCOSE: 191 mg/dL — AB (ref 65–99)
POTASSIUM: 3.5 mmol/L (ref 3.5–5.1)
Sodium: 136 mmol/L (ref 135–145)

## 2015-03-13 LAB — RESPIRATORY VIRUS PANEL
ADENOVIRUS: NEGATIVE
INFLUENZA A: NEGATIVE
INFLUENZA B 1: NEGATIVE
Metapneumovirus: NEGATIVE
PARAINFLUENZA 3 A: NEGATIVE
Parainfluenza 1: NEGATIVE
Parainfluenza 2: NEGATIVE
Respiratory Syncytial Virus A: NEGATIVE
Respiratory Syncytial Virus B: NEGATIVE
Rhinovirus: NEGATIVE

## 2015-03-13 LAB — CBC
HEMATOCRIT: 24.9 % — AB (ref 39.0–52.0)
HEMOGLOBIN: 8.4 g/dL — AB (ref 13.0–17.0)
MCH: 32.7 pg (ref 26.0–34.0)
MCHC: 33.7 g/dL (ref 30.0–36.0)
MCV: 96.9 fL (ref 78.0–100.0)
PLATELETS: 262 10*3/uL (ref 150–400)
RBC: 2.57 MIL/uL — AB (ref 4.22–5.81)
RDW: 16.8 % — ABNORMAL HIGH (ref 11.5–15.5)
WBC: 9.7 10*3/uL (ref 4.0–10.5)

## 2015-03-13 MED ORDER — METHYLPREDNISOLONE SODIUM SUCC 125 MG IJ SOLR
80.0000 mg | Freq: Two times a day (BID) | INTRAMUSCULAR | Status: DC
  Administered 2015-03-14 – 2015-03-16 (×6): 80 mg via INTRAVENOUS
  Filled 2015-03-13: qty 1.28
  Filled 2015-03-13: qty 2
  Filled 2015-03-13 (×4): qty 1.28

## 2015-03-13 NOTE — Progress Notes (Signed)
Patient ID: Harold Jennings, male   DOB: 08-Jun-1932, 82 y.o.   MRN: [id number] [facility] PROGRESS NOTE  [name] [id number] DOB: [birth year] DOA: 03/04/2015 PCP: London Pepper, MD  Brief narrative:    82 y.o. male with PMH of chronic atrial fibrillation on Xarelto, chronic systolic CHF (EF [id number]), hypertension and lumbar spinal stenosis who presented from SNF for evaluation of acute hypoxic respiratory failure. Patient was admitted to [facility] from 02/17/2015 - 02/25/2015, during which time he was aggressively diuresed and treated empirically for CAP with azithromycin and Rocephin.  In ED, patient was found to be afebrile, saturating in the 80s on 4 L/m supplemental oxygen and with remaining vital signs stable. Chest x-ray demonstrated diffuse bilateral patchy opacities concerning for pneumonia, edema less likely. The 12 lead EKG featured atrial fibrillation with right bundle branch block and borderline ST depression in leads V4 to V6.  Blood work was notable for mild hyponatremia, serum bicarbonate of 19 and serum creatinine of 1.79 which is up from his baseline of 1. BNP was elevated at 1500 and troponin level was 0.75. CBC was unremarkable.   In the emergency department, the patient was given a 40 mg IV push of Lasix and treated with empiric vancomycin and cefepime. His O2 sat improved to the 90s on oxi-mask, he remained hemodynamically stable.  Major events since admission: 2/18 - still on NRB, unable to wean off, CT chest requested  2/19 - added doxycycline for atypical coverage, also added Lasix 40 mg in addition to QD dosing for better diuresis  2/21 - off NRB, transitioned to [place]  Assessment/Plan:    Principal Problem: Acute on chronic hypoxic respiratory failure / HCAP, bilateral lobes - Has completed appropriate antibiotics by 03/11/2015 - Continue oxygen support [place] to keep O2 sat above 90% - Will taper down steroids, from every 6 hours to every 12 hours but  continue same dose 80 mg IV  Active Problems: Acute on chronic systolic CHF  - TTE (123456) EF 40-45%, mild LVH, severe LA dilation, mild-moderately elevated PA pressures  - BNP 1500 on admission, was 820 on 02/17/15  - Diuresed with IV Lasix. Due to ongoing renal insufficiency lasix held since 2/21 - Weight in past 72 hours: 72 kg --> 72.2 kg --> 73.3 kg - Continue ASA 81, Metoprolol   Acute kidney injury with hyperkalemia  - SCr 1.8 on admission - Likely component of urinary retention  - Please note pyridium c/i in renal insufficiency so we stopped it - Cr 2.46 this am - Renal US with no acute findings   Chronic atrial fibrillation  - CHADS vasc score 6 (age, HTN, CHF, h/o TIA) - Continue AC with Xarelto  - Rate-controlled with metoprolol 12.5 mg PO BID  Hyponatremia  - Likely due to dehydration - Normalized subsequently   Hypothyroidism - Continue Synthroid   BPH  - Continue Flomax, finasteride   Elevated cardiac biomarker - Per cardio, no further interventions at this time   Depression - Continue Zoloft    DVT Prophylaxis  - On full dose AC with xarelto   Code Status: DNR/DNI Family Communication:  plan of care discussed with the patient's wife 2/25 Disposition Plan: D/C 2/27, SNF  IV access:  Peripheral IV  Procedures and diagnostic studies:    Ct Abdomen Pelvis Wo Contrast 03/12/2015 Diffuse interstitial changes in the lung bases stable from the recent CT. Cholelithiasis without complicating factors. Left renal calculi without obstructive change. Resolution of previously seen  right renal calculi. Mild fullness of the right collecting system is noted without definitive stone.   Dg Chest 2 View 03/11/2015   Possible right pneumoperitoneum. CT evaluation is recommended as clinically indicated. Decreased interstitial and airspace opacities within both lungs.   Ct Chest Wo Contrast 03/06/2015  04/22/2014 CT scan of the abdomen pelvis shows mild interstitial  change at the lung bases. Today's study shows much more pronounced interstitial opacification, along with small bilateral pleural effusions. There is cardio megaly with mild venous congestion. This study confirms significant acute interstitial process superimposed on mild chronic interstitial lung disease. The findings would suggest the possibility of congestive heart failure with interstitial edema, but clinical worsening despite diuresis indicates that the interstitial infiltrates seen bilaterally may be due to other etiologies including atypical pneumonia.   US Renal 03/12/2015 No acute findings.  No hydronephrosis. Bilateral nephrolithiasis.   Medical Consultants:  PCCM PCT Cardiology   Other Consultants:  PT  IAnti-Infectives:   Vancomycin 2/16 --> 2/19 Maxipime 2/16 --> 2/23 Doxycycline 2/19 --> 2/23   Leisa Lenz, MD  [facility] Pager [phone]  Time spent in minutes: 25 minutes  If 7PM-7AM, please contact night-coverage [email or web address] Password [email or web address] 03/13/2015, 8:03 PM   LOS: 9 days    HPI/Subjective: No acute overnight events. No respiratory distress.   Objective: Filed Vitals:   03/12/15 2100 03/13/15 0355 03/13/15 0632 03/13/15 1420  BP: 140/92 116/80 124/65 137/84  Pulse: 98 66 89 99  Temp: 97.5 F (36.4 C) 97.4 F (36.3 C) 97.5 F (36.4 C) 97.5 F (36.4 C)  TempSrc: Oral Oral Oral Oral  Resp: 16 18 18 18   Height:      Weight:  73.3 kg (161 lb 9.6 oz)    SpO2: 94% 93% 97% 90%    Intake/Output Summary (Last 24 hours) at 03/13/15 2003 Last data filed at 03/13/15 1548  Gross per 24 hour  Intake   2100 ml  Output   1700 ml  Net    400 ml    Exam:   General:  Pt is sleeping, not in acute distress  Cardiovascular: Rate controlled, S1/S2 (+)  Respiratory: No wheezing, no crackles, no rhonchi  Abdomen: Soft, non tender, non distended, bowel sounds present  Extremities: No tenderness, pulses palpable bilaterally  Neuro: Grossly  nonfocal  Data Reviewed: Basic Metabolic Panel:  Recent Labs Lab 03/09/15 1213 03/10/15 0521 03/11/15 0506 03/12/15 0518 03/13/15 0621  NA 128* 127* 128* 139 136  K 6.0* 5.5* 5.4* 4.4 3.5  CL 97* 96* 96* 104 102  CO2 22 19* 21* 21* 22  GLUCOSE 217* 181* 183* 176* 191*  BUN 54* 65* 69* 77* 79*  CREATININE 2.47* 2.58* 2.51* 2.62* 2.46*  CALCIUM 9.0 9.0 8.9 9.3 8.6*   Liver Function Tests: No results for input(s): AST, ALT, ALKPHOS, BILITOT, PROT, ALBUMIN in the last 168 hours. No results for input(s): LIPASE, AMYLASE in the last 168 hours. No results for input(s): AMMONIA in the last 168 hours. CBC:  Recent Labs Lab 03/09/15 0536 03/10/15 0521 03/11/15 0506 03/12/15 0518 03/13/15 0621  WBC 5.4 9.5 9.2 9.4 9.7  HGB 9.6* 9.3* 9.1* 9.1* 8.4*  HCT 29.9* 29.1* 27.8* 27.9* 24.9*  MCV 99.7 98.6 97.9 97.9 96.9  PLT 346 349 310 314 262   Cardiac Enzymes: No results for input(s): CKTOTAL, CKMB, CKMBINDEX, TROPONINI in the last 168 hours. BNP: Invalid input(s): POCBNP CBG: No results for input(s): GLUCAP in the last 168 hours.  Recent Results (from the  past 240 hour(s))  MRSA PCR Screening     Status: None   Collection Time: 03/05/15  9:39 AM  Result Value Ref Range Status   MRSA by PCR NEGATIVE NEGATIVE Final    Comment:        The GeneXpert MRSA Assay (FDA approved for NASAL specimens only), is one component of a comprehensive MRSA colonization surveillance program. It is not intended to diagnose MRSA infection nor to guide or monitor treatment for MRSA infections.   Respiratory virus panel     Status: None   Collection Time: 03/10/15  7:57 PM  Result Value Ref Range Status   Source - RVPAN NASAL WASHINGS  Corrected   Respiratory Syncytial Virus A Negative Negative Final   Respiratory Syncytial Virus B Negative Negative Final   Influenza A Negative Negative Final   Influenza B Negative Negative Final   Parainfluenza 1 Negative Negative Final    Parainfluenza 2 Negative Negative Final   Parainfluenza 3 Negative Negative Final   Metapneumovirus Negative Negative Final   Rhinovirus Negative Negative Final   Adenovirus Negative Negative Final    Comment: (NOTE) Performed At: [facility] [place], [place] [id number] Lindon Romp MD [id number]      Scheduled Meds: . acetaminophen  1,000 mg Oral TID  . aspirin EC  81 mg Oral Daily  . feeding supplement (ENSURE ENLIVE)  237 mL Oral BID BM  . finasteride  5 mg Oral Daily  . levothyroxine  125 mcg Oral QAC breakfast  . lidocaine  1 application Urethral Once  . methylPREDNISolone   80 mg Intravenous Q6H  . metoprolol succinate  12.5 mg Oral BID  . midodrine  10 mg Oral TID WC  . mirabegron ER  25 mg Oral Daily  . nystatin-triamcinolone  1 application Topical TID  . Rivaroxaban  15 mg Oral q1800  . saccharomyces boulardii  250 mg Oral q morning - 10a  . sertraline  50 mg Oral Daily  . tamsulosin  0.8 mg Oral Daily

## 2015-03-13 NOTE — Progress Notes (Signed)
Pt apparently lost consciousnes briefly while sitting on side of bed, NT in attendance. Pt returned to consciousness immediately. Harold Jennings, [employer]

## 2015-03-13 NOTE — Progress Notes (Addendum)
Pt grunting, bearing down attempting to void, bladder scanned pt, showed 782 mls of urine. Notified MD on call, ordered in and out cath. Performed I&O cath, 800 mls of urine output. Post void residual 33ml. Will continue to monitor pt. Bed in lowest position, call bell within reach.   Janell Quiet, RN

## 2015-03-14 LAB — BASIC METABOLIC PANEL
ANION GAP: 10 (ref 5–15)
BUN: 87 mg/dL — ABNORMAL HIGH (ref 6–20)
CALCIUM: 8.8 mg/dL — AB (ref 8.9–10.3)
CO2: 24 mmol/L (ref 22–32)
Chloride: 105 mmol/L (ref 101–111)
Creatinine, Ser: 2.31 mg/dL — ABNORMAL HIGH (ref 0.61–1.24)
GFR calc Af Amer: 29 mL/min — ABNORMAL LOW (ref >60)
GFR, EST NON AFRICAN AMERICAN: 25 mL/min — AB (ref >60)
GLUCOSE: 186 mg/dL — AB (ref 65–99)
Potassium: 3.4 mmol/L — ABNORMAL LOW (ref 3.5–5.1)
Sodium: 139 mmol/L (ref 135–145)

## 2015-03-14 LAB — CBC
HEMATOCRIT: 24.7 % — AB (ref 39.0–52.0)
Hemoglobin: 8.2 g/dL — ABNORMAL LOW (ref 13.0–17.0)
MCH: 32.3 pg (ref 26.0–34.0)
MCHC: 33.2 g/dL (ref 30.0–36.0)
MCV: 97.2 fL (ref 78.0–100.0)
PLATELETS: 240 10*3/uL (ref 150–400)
RBC: 2.54 MIL/uL — ABNORMAL LOW (ref 4.22–5.81)
RDW: 16.7 % — AB (ref 11.5–15.5)
WBC: 8.1 10*3/uL (ref 4.0–10.5)

## 2015-03-14 MED ORDER — POTASSIUM CHLORIDE CRYS ER 10 MEQ PO TBCR
10.0000 meq | EXTENDED_RELEASE_TABLET | Freq: Once | ORAL | Status: AC
  Administered 2015-03-14: 10 meq via ORAL
  Filled 2015-03-14: qty 1

## 2015-03-14 NOTE — NC FL2 (Signed)
[place] LEVEL OF CARE SCREENING TOOL     IDENTIFICATION  Patient Name: Harold Jennings Birthdate: [birth year] Sex: male Admission Date (Current Location): 03/04/2015  [facility] and [place] Number:  Herbalist and Address:  [facility] [place],  [place] [address] [place]      Provider Number: [id number]  Attending Physician Name and Address:  Theodis Blaze, MD  Relative Name and Phone Number:       Current Level of Care: Hospital Recommended Level of Care: [place] Prior Approval Number:    Date Approved/Denied:   PASRR Number: [id number]  Discharge Plan: SNF    Current Diagnoses: Patient Active Problem List   Diagnosis Date Noted  . ILD (interstitial lung disease) ([place])   . Dyspnea   . Acute respiratory failure with hypoxemia ([place])   . Acute on chronic congestive heart failure ([place])   . Acute pneumonitis   . Goals of care, counseling/discussion   . Palliative care encounter   . Pressure ulcer 03/05/2015  . Malnutrition of moderate degree 03/05/2015  . Elevated troponin   . CHF (congestive heart failure) ([place]) 03/04/2015  . Acute on chronic systolic CHF (congestive heart failure) ([place]) 03/04/2015  . HCAP (healthcare-associated pneumonia) 03/04/2015  . Hyponatremia 03/04/2015  . Acute respiratory failure with hypoxia ([place]) 03/04/2015  . Diarrhea 03/03/2015  . Edema 03/03/2015  . TIA (transient ischemic attack)   . UTI (lower urinary tract infection) 02/17/2015  . CAP (community acquired pneumonia) 02/17/2015  . Acute on chronic combined systolic and diastolic congestive heart failure ([place]) 02/17/2015  . Slurred speech 02/17/2015  . Dizziness 02/17/2015  . ARF (acute renal failure) ([place]) 07/12/2014  . BPH (benign prostatic hyperplasia) 07/12/2014  . Fall 07/12/2014  . Hypothyroidism 07/12/2014  . Colovesical fistula 06/11/2014  . Chronic atrial fibrillation ([place]) 01/14/2014  . Chronic combined systolic and  diastolic congestive heart failure ([place]) 01/14/2014  . Merkel cell carcinoma ([place]) 06/30/2013  . Dyslipidemia 05/23/2008  . Essential hypertension 05/23/2008    Orientation RESPIRATION BLADDER Height & Weight     Self, Time, Place  O2 ([place]) Indwelling catheter Weight: 162 lb 11.2 oz (73.8 kg) Height:  5\' 5"  (165.1 cm)  BEHAVIORAL SYMPTOMS/MOOD NEUROLOGICAL BOWEL NUTRITION STATUS      Continent Diet (see DC summary)  AMBULATORY STATUS COMMUNICATION OF NEEDS Skin   Extensive Assist Verbally PU Stage and Appropriate Care   PU Stage 2 Dressing:  (dressing change every 5 days)                   Personal Care Assistance Level of Assistance    Bathing Assistance: Limited assistance   Dressing Assistance: Limited assistance     Functional Limitations Info             [place]  PT (By licensed PT), OT (By licensed OT)     PT Frequency: 5/wk OT Frequency: 5/wk            Contractures      Additional Factors Info  Code Status, Allergies, Psychotropic, Isolation Precautions Code Status Info: FULL Allergies Info: Oxybutynin, Latex Psychotropic Info: zoloft   Isolation Precautions Info: droplet, enteric, MRSA     Current Medications (03/14/2015):  This is the current hospital active medication list Current Facility-Administered Medications  Medication Dose Route Frequency Provider Last Rate Last Dose  . 0.9 %  sodium chloride infusion  250 mL Intravenous PRN Vianne Bulls, MD 10  mL/hr at 03/13/15 1548 250 mL at 03/13/15 1548  . acetaminophen (TYLENOL) tablet 1,000 mg  1,000 mg Oral TID Acquanetta Chain, DO   1,000 mg at 03/13/15 2153  . aspirin EC tablet 81 mg  81 mg Oral Daily Vianne Bulls, MD   81 mg at 03/13/15 1050  . feeding supplement (ENSURE ENLIVE) (ENSURE ENLIVE) liquid 237 mL  237 mL Oral BID BM Ilene Qua Opyd, MD   237 mL at 03/13/15 1110  . finasteride (PROSCAR) tablet 5 mg  5 mg Oral Daily Vianne Bulls, MD   5 mg at 03/13/15  1050  . HYDROmorphone (DILAUDID) injection 1 mg  1 mg Intravenous Q2H PRN Acquanetta Chain, DO   1 mg at 03/14/15 0203  . levalbuterol (XOPENEX) nebulizer solution 1.25 mg  1.25 mg Nebulization Q6H PRN Theodis Blaze, MD      . levothyroxine (SYNTHROID, LEVOTHROID) tablet 125 mcg  125 mcg Oral QAC breakfast Vianne Bulls, MD   125 mcg at 03/14/15 0902  . lidocaine (XYLOCAINE) 2 % jelly 1 application  1 application Urethral Once Acquanetta Chain, DO   Stopped at 03/12/15 1200  . lip balm (CARMEX) ointment   Topical PRN Dionne Milo, NP      . loperamide (IMODIUM) capsule 2 mg  2 mg Oral Q6H PRN Vianne Bulls, MD      . LORazepam (ATIVAN) injection 0.5 mg  0.5 mg Intravenous Q4H PRN Acquanetta Chain, DO   0.5 mg at 03/12/15 1616  . methylPREDNISolone sodium succinate (SOLU-MEDROL) 125 mg/2 mL injection 80 mg  80 mg Intravenous Q12H Robbie Lis, MD   80 mg at 03/14/15 0600  . metoprolol succinate (TOPROL-XL) 24 hr tablet 12.5 mg  12.5 mg Oral BID Theodis Blaze, MD   12.5 mg at 03/13/15 2153  . midodrine (PROAMATINE) tablet 10 mg  10 mg Oral TID WC Vianne Bulls, MD   10 mg at 03/14/15 0902  . mirabegron ER (MYRBETRIQ) tablet 25 mg  25 mg Oral Daily Acquanetta Chain, DO   25 mg at 03/13/15 1050  . nystatin-triamcinolone (MYCOLOG II) cream 1 application  1 application Topical TID Vianne Bulls, MD   1 application at [id number]  . ondansetron (ZOFRAN) injection 4 mg  4 mg Intravenous Q6H PRN Vianne Bulls, MD      . Rivaroxaban (XARELTO) tablet 15 mg  15 mg Oral q1800 Vianne Bulls, MD   15 mg at 03/12/15 1739  . saccharomyces boulardii (FLORASTOR) capsule 250 mg  250 mg Oral q morning - 10a Ilene Qua Opyd, MD   250 mg at 03/13/15 1050  . sertraline (ZOLOFT) tablet 50 mg  50 mg Oral Daily Vianne Bulls, MD   50 mg at 03/13/15 1050  . sodium chloride flush (NS) 0.9 % injection 3 mL  3 mL Intravenous Q12H Ilene Qua Opyd, MD   3 mL at 03/12/15 1045  . sodium chloride flush (NS)  0.9 % injection 3 mL  3 mL Intravenous PRN Vianne Bulls, MD      . tamsulosin (FLOMAX) capsule 0.8 mg  0.8 mg Oral Daily Acquanetta Chain, DO   0.8 mg at 03/13/15 1250     Discharge Medications: Please see discharge summary for a list of discharge medications.  Relevant Imaging Results:  Relevant Lab Results:   Additional Information SSN [id number]  [place] Mon, [place]

## 2015-03-14 NOTE — Progress Notes (Signed)
Patient unable to void and complaining of abdominal pain. Bladder scan done w/ result of 511cc's urine. Two attempts to I & O cath done and unable to pass catheter into bladder. On-call K. [place]pine notified. [facility] at [facility] unable to assist with coude catheter placement. Call placed to ED and [place] Slider RN came to floor and successfully placed coude 35F catheter, immediate return of 350cc's bloody urine.  Patient tolerated procedure well despite complaint of significant pain during insertion. Will continue to monitor patient closely. Christen Bame RN

## 2015-03-14 NOTE — Progress Notes (Signed)
CSW called wife to discuss plan for time of DC- during previous CSW assessment pt wife plan was to take patient home and not go back to SNF.  CSW explained continuing concerns with pt mobility as possible barrier to return home- wife expressed understanding and states that if pt is continuing to need such high levels of physical assistance it might not be possible to go home but home remains the top choice.  Wife is agreeable to renewed search for SNF options- agreeable to return to [facility] if that is possible.  CSW will continue to follow- referral sent to [place], [place] Worker [phone]

## 2015-03-14 NOTE — Progress Notes (Addendum)
Patient ID: Harold Jennings, male   DOB: 12-27-1932, 82 y.o.   MRN: [id number] [facility] PROGRESS NOTE  Harold Jennings [id number] DOB: 03/04/1932 DOA: 03/04/2015 PCP: London Pepper, MD  Brief narrative:    82 y.o. male with PMH of chronic atrial fibrillation on Xarelto, chronic systolic CHF (EF A999333), hypertension and lumbar spinal stenosis who presented from SNF for evaluation of acute hypoxic respiratory failure. Patient was admitted to [facility] from 02/17/2015 - 02/25/2015, during which time he was aggressively diuresed and treated empirically for CAP with azithromycin and Rocephin.  In ED, patient was found to be afebrile, saturating in the 80s on 4 L/m supplemental oxygen and with remaining vital signs stable. Chest x-ray demonstrated diffuse bilateral patchy opacities concerning for pneumonia, edema less likely. The 12 lead EKG featured atrial fibrillation with right bundle branch block and borderline ST depression in leads V4 to V6.  Blood work was notable for mild hyponatremia, serum bicarbonate of 19 and serum creatinine of 1.79 which is up from his baseline of 1. BNP was elevated at 1500 and troponin level was 0.75. CBC was unremarkable.   In the emergency department, the patient was given a 40 mg IV push of Lasix and treated with empiric vancomycin and cefepime. His O2 sat improved to the 90s on oxi-mask, he remained hemodynamically stable.  Major events since admission: 2/18 - still on NRB, unable to wean off, CT chest requested  2/19 - added doxycycline for atypical coverage, also added Lasix 40 mg in addition to QD dosing for better diuresis  2/21 - off NRB, transitioned to [place]  Assessment/Plan:    Principal Problem: Acute on chronic hypoxic respiratory failure / HCAP, bilateral lobes - Has completed appropriate antibiotics by 03/11/2015 - Continue oxygen support va [place] to keep O2 sat above 90%, currently on 9 L via [place] - Will taper down steroids, from every 6 hours  to every 12 hours but continue same dose 80 mg IV  Active Problems: Acute on chronic systolic CHF  - TTE (123456) EF 40-45%, mild LVH, severe LA dilation, mild-moderately elevated PA pressures  - BNP 1500 on admission, was 820 on 02/17/15  - Diuresed with IV Lasix. Due to ongoing renal insufficiency lasix held since 2/21 - Weight in past 72 hours: 72 kg --> 72.2 kg --> 73.3 kg - Continue ASA 81, Metoprolol     Hypokalemia - mild, repeat BMP in AM - supplement   Acute kidney injury with hyperkalemia  - SCr 1.8 on admission - Likely component of urinary retention  - Please note pyridium c/i in renal insufficiency so we stopped it - Cr 2.31 this am - Renal US with no acute findings   Chronic atrial fibrillation  - CHADS vasc score 6 (age, HTN, CHF, h/o TIA) - Continue AC with Xarelto  - Rate-controlled with metoprolol 12.5 mg PO BID  Hyponatremia  - Likely due to dehydration - resolved   Hypothyroidism - Continue Synthroid   BPH  - Continue Flomax, finasteride   Elevated cardiac biomarker - Per cardio, no further interventions at this time   Depression - Continue Zoloft    DVT Prophylaxis  - On full dose AC with xarelto   Code Status: DNR/DNI Family Communication:  plan of care discussed with the patient's wife 2/25 Disposition Plan: D/C 2/28, SNF  IV access:  Peripheral IV  Procedures and diagnostic studies:    Ct Abdomen Pelvis Wo Contrast 03/12/2015 Diffuse interstitial changes in the lung bases stable  from the recent CT. Cholelithiasis without complicating factors. Left renal calculi without obstructive change. Resolution of previously seen right renal calculi. Mild fullness of the right collecting system is noted without definitive stone.   Dg Chest 2 View 03/11/2015   Possible right pneumoperitoneum. CT evaluation is recommended as clinically indicated. Decreased interstitial and airspace opacities within both lungs.   Ct Chest Wo Contrast 03/06/2015   04/22/2014 CT scan of the abdomen pelvis shows mild interstitial change at the lung bases. Today's study shows much more pronounced interstitial opacification, along with small bilateral pleural effusions. There is cardio megaly with mild venous congestion. This study confirms significant acute interstitial process superimposed on mild chronic interstitial lung disease. The findings would suggest the possibility of congestive heart failure with interstitial edema, but clinical worsening despite diuresis indicates that the interstitial infiltrates seen bilaterally may be due to other etiologies including atypical pneumonia.   US Renal 03/12/2015 No acute findings.  No hydronephrosis. Bilateral nephrolithiasis.   Medical Consultants:  PCCM PCT Cardiology   Other Consultants:  PT  IAnti-Infectives:   Vancomycin 2/16 --> 2/19 Maxipime 2/16 --> 2/23 Doxycycline 2/19 --> 2/23  [name], MD  [facility] Pager [phone]  Time spent in minutes: 25 minutes  If 7PM-7AM, please contact night-coverage [email or web address] Password [facility] 03/14/2015, 5:24 PM   LOS: 10 days    HPI/Subjective: No acute overnight events. No respiratory distress.   Objective: Filed Vitals:   03/13/15 2029 03/14/15 0558 03/14/15 0905 03/14/15 1507  BP: 115/60 122/74  124/79  Pulse: 88 84  94  Temp: 97.4 F (36.3 C) 98.3 F (36.8 C)  97.4 F (36.3 C)  TempSrc: Axillary Axillary  Oral  Resp: 17 18  18   Height:      Weight:   73.8 kg (162 lb 11.2 oz)   SpO2: 97% 96%  93%    Intake/Output Summary (Last 24 hours) at 03/14/15 1724 Last data filed at 03/14/15 1517  Gross per 24 hour  Intake 1159.5 ml  Output   1325 ml  Net -165.5 ml    Exam:   General:  Pt is sleeping, not in acute distress  Cardiovascular: Rate controlled, S1/S2 (+)  Respiratory: No wheezing, diminished breath sounds at bases  Abdomen: Soft, non tender, non distended, bowel sounds present  Extremities: No tenderness,  pulses palpable bilaterally  Neuro: Grossly nonfocal  Data Reviewed: Basic Metabolic Panel:  Recent Labs Lab 03/10/15 0521 03/11/15 0506 03/12/15 0518 03/13/15 0621 03/14/15 0427  NA 127* 128* 139 136 139  K 5.5* 5.4* 4.4 3.5 3.4*  CL 96* 96* 104 102 105  CO2 19* 21* 21* 22 24  GLUCOSE 181* 183* 176* 191* 186*  BUN 65* 69* 77* 79* 87*  CREATININE 2.58* 2.51* 2.62* 2.46* 2.31*  CALCIUM 9.0 8.9 9.3 8.6* 8.8*   CBC:  Recent Labs Lab 03/10/15 0521 03/11/15 0506 03/12/15 0518 03/13/15 0621 03/14/15 0427  WBC 9.5 9.2 9.4 9.7 8.1  HGB 9.3* 9.1* 9.1* 8.4* 8.2*  HCT 29.1* 27.8* 27.9* 24.9* 24.7*  MCV 98.6 97.9 97.9 96.9 97.2  PLT 349 310 314 262 240   Cardiac Enzymes: No results for input(s): CKTOTAL, CKMB, CKMBINDEX, TROPONINI in the last 168 hours. BNP: Invalid input(s): POCBNP CBG: No results for input(s): GLUCAP in the last 168 hours.  Recent Results (from the past 240 hour(s))  MRSA PCR Screening     Status: None   Collection Time: 03/05/15  9:39 AM  Result Value Ref Range Status  MRSA by PCR NEGATIVE NEGATIVE Final    Comment:        The GeneXpert MRSA Assay (FDA approved for NASAL specimens only), is one component of a comprehensive MRSA colonization surveillance program. It is not intended to diagnose MRSA infection nor to guide or monitor treatment for MRSA infections.   Respiratory virus panel     Status: None   Collection Time: 03/10/15  7:57 PM  Result Value Ref Range Status   Source - RVPAN NASAL WASHINGS  Corrected   Respiratory Syncytial Virus A Negative Negative Final   Respiratory Syncytial Virus B Negative Negative Final   Influenza A Negative Negative Final   Influenza B Negative Negative Final   Parainfluenza 1 Negative Negative Final   Parainfluenza 2 Negative Negative Final   Parainfluenza 3 Negative Negative Final   Metapneumovirus Negative Negative Final   Rhinovirus Negative Negative Final   Adenovirus Negative Negative Final     Comment: (NOTE) Performed At: [name] [place], [place] [id number] Lindon Romp MD [id number]      Scheduled Meds: . acetaminophen  1,000 mg Oral TID  . aspirin EC  81 mg Oral Daily  . feeding supplement (ENSURE ENLIVE)  237 mL Oral BID BM  . finasteride  5 mg Oral Daily  . levothyroxine  125 mcg Oral QAC breakfast  . lidocaine  1 application Urethral Once  . methylPREDNISolone   80 mg Intravenous Q6H  . metoprolol succinate  12.5 mg Oral BID  . midodrine  10 mg Oral TID WC  . mirabegron ER  25 mg Oral Daily  . nystatin-triamcinolone  1 application Topical TID  . Rivaroxaban  15 mg Oral q1800  . saccharomyces boulardii  250 mg Oral q morning - 10a  . sertraline  50 mg Oral Daily  . tamsulosin  0.8 mg Oral Daily

## 2015-03-15 ENCOUNTER — Inpatient Hospital Stay (HOSPITAL_COMMUNITY): Payer: Medicare Other

## 2015-03-15 DIAGNOSIS — F329 Major depressive disorder, single episode, unspecified: Secondary | ICD-10-CM

## 2015-03-15 LAB — BASIC METABOLIC PANEL
Anion gap: 9 (ref 5–15)
BUN: 81 mg/dL — AB (ref 6–20)
CHLORIDE: 105 mmol/L (ref 101–111)
CO2: 24 mmol/L (ref 22–32)
Calcium: 8.6 mg/dL — ABNORMAL LOW (ref 8.9–10.3)
Creatinine, Ser: 2.24 mg/dL — ABNORMAL HIGH (ref 0.61–1.24)
GFR calc non Af Amer: 26 mL/min — ABNORMAL LOW (ref >60)
GFR, EST AFRICAN AMERICAN: 30 mL/min — AB (ref >60)
Glucose, Bld: 205 mg/dL — ABNORMAL HIGH (ref 65–99)
POTASSIUM: 3.5 mmol/L (ref 3.5–5.1)
SODIUM: 138 mmol/L (ref 135–145)

## 2015-03-15 LAB — CBC
HCT: 25.4 % — ABNORMAL LOW (ref 39.0–52.0)
HEMOGLOBIN: 8.4 g/dL — AB (ref 13.0–17.0)
MCH: 32.4 pg (ref 26.0–34.0)
MCHC: 33.1 g/dL (ref 30.0–36.0)
MCV: 98.1 fL (ref 78.0–100.0)
Platelets: 227 10*3/uL (ref 150–400)
RBC: 2.59 MIL/uL — AB (ref 4.22–5.81)
RDW: 16.9 % — ABNORMAL HIGH (ref 11.5–15.5)
WBC: 7.6 10*3/uL (ref 4.0–10.5)

## 2015-03-15 NOTE — Progress Notes (Signed)
Physical Therapy Treatment Patient Details Name: Harold Jennings MRN: [id number] DOB: 03/30/1932 Today's Date: 03/15/2015    History of Present Illness 82 y.o. male with PMH of chronic atrial fibrillation on Xarelto, chronic systolic CHF (EF A999333), hypertension, and lumbar spinal stenosis who presented from his SNF for evaluation of acute hypoxic respiratory failure.    PT Comments    Pt in bed on 8lts nasal 96%, BP 124/48(66), HR 84 alert and aware he is at "[facility]".  Assisted to EOB required much effort and + 2 assist.  Pt unable to sit EOB on own and required Mod Assist to prevent LOB.  Found pt to be incont stool.  Assisted to [facility] + 2 assist.  Several sit to stands to perform hygiene pt became increasingly weaker and more fatigued.  Stas decreased to 80% with HR increased to 140's. By the forth sit to stand, pt required + 3 assist to move to recliner.  Too weak to attempt amb today as this activity completely exhausted him.  In recliner after a rest period, sats recovered to 98%.    Follow Up Recommendations  SNF     Equipment Recommendations       Recommendations for Other Services       Precautions / Restrictions Precautions Precautions: Fall Precaution Comments: monitor vitals Restrictions Weight Bearing Restrictions: No    Mobility  Bed Mobility Overal bed mobility: Needs Assistance Bed Mobility: Rolling;Sidelying to Sit Rolling: Min assist;Mod assist;+2 for safety/equipment Sidelying to sit: Mod assist;Max assist;+2 for safety/equipment Supine to sit: Mod assist;Max assist;+2 for safety/equipment     General bed mobility comments: required + 2 assist and increased time.  Pt unable to sit EOB on own.  Required Mod assist to stay upright.  Assisted to [facility] due to incont bowel + 2 total Assist .  Transfers Overall transfer level: Needs assistance Equipment used: None Transfers: Stand Pivot Transfers Sit to Stand: Max assist;+2 physical assistance;+2  safety/equipment Stand pivot transfers: Max assist;+2 physical assistance;+2 safety/equipment       General transfer comment: assisted from elevated bed to [facility] 1/4 pivot turn "[employer] tech.  Pt able to stand briefly and support self but required increased assist to complete pivot and target BSC.  Performed several sit to stand off BSC to perform hygiene.  Each time pt required increased assist showing increased fatigue.  O2 sats decreased to 80% with HR 145.  Pt slumped over on BSC still atert and talking but too weak to sit upright.  + 3 assist to transfer off BSC to recliner at this point pt to weak to assist any more that 15%.  Positioned in recliner ahnd allowed to rest.  O2 sats increased to 98% on 8 lts with HR 77.    Ambulation/Gait             General Gait Details: too weak to attempt due to low performance level wityh transfer.    Stairs            Wheelchair Mobility    Modified Rankin (Stroke Patients Only)       Balance                                    Cognition Arousal/Alertness: Awake/alert Behavior During Therapy: WFL for tasks assessed/performed Overall Cognitive Status: Within Functional Limits for tasks assessed  Exercises      General Comments        Pertinent Vitals/Pain Pain Assessment: No/denies pain    Home Living                      Prior Function            PT Goals (current goals can now be found in the care plan section) Progress towards PT goals: Progressing toward goals    Frequency       PT Plan Current plan remains appropriate    Co-evaluation             End of Session Equipment Utilized During Treatment: Gait belt;Oxygen Activity Tolerance: Patient limited by fatigue Patient left: in chair;with call bell/phone within reach;with family/visitor present     Time: 1035-1100 PT Time Calculation (min) (ACUTE ONLY): 25 min  Charges:  $Therapeutic  Activity: 23-37 mins                    G Codes:      Rica Koyanagi  PTA [facility]  Acute  Rehab Pager      [phone]

## 2015-03-15 NOTE — Progress Notes (Signed)
Discussed with Dr Charlies Silvers. PCCM can sign off  Dr. Brand Males, M.D., [facility].C.P Pulmonary and Critical Care Medicine Staff Physician [facility] Pager: [phone], If no answer or between  15:00h - 7:00h: call [phone]  03/15/2015 9:49 AM

## 2015-03-15 NOTE — Progress Notes (Signed)
Patient ID: [name], male   DOB: 10-30-1932, 82 y.o.   MRN: [id number] [facility] PROGRESS NOTE  Harold Jennings [id number] DOB: September 08, 1932 DOA: 03/04/2015 PCP: Harold Pepper, MD  Brief narrative:    82 y.o. male with PMH of chronic atrial fibrillation on Xarelto, chronic systolic CHF (EF A999333), hypertension and lumbar spinal stenosis who presented from SNF for evaluation of acute hypoxic respiratory failure. Patient was admitted to [facility] from 02/17/2015 - 02/25/2015, during which time he was aggressively diuresed and treated empirically for CAP with azithromycin and Rocephin.  In ED, patient was found to be afebrile, saturating in the 80s on 4 L/m supplemental oxygen and with remaining vital signs stable. Chest x-ray demonstrated diffuse bilateral patchy opacities concerning for pneumonia, edema less likely. The 12 lead EKG featured atrial fibrillation with right bundle branch block and borderline ST depression in leads V4 to V6.  Blood work was notable for mild hyponatremia, serum bicarbonate of 19 and serum creatinine of 1.79 which is up from his baseline of 1. BNP was elevated at 1500 and troponin level was 0.75. CBC was unremarkable.   In the emergency department, the patient was given a 40 mg IV push of Lasix and treated with empiric vancomycin and cefepime. His O2 sat improved to the 90s on oxi-mask, he remained hemodynamically stable.  Major events since admission: 2/18 - still on NRB, unable to wean off, CT chest requested  2/19 - added doxycycline for atypical coverage, also added Lasix 40 mg in addition to QD dosing for better diuresis  2/21 - off NRB, transitioned to [place]  Assessment/Plan:    Principal Problem: Acute on chronic hypoxic respiratory failure / HCAP, bilateral lobes - Patient has completed appropriate antibiotics by 03/11/2015 - Stable respiratory status but still require significant oxygen support to keep oxygen saturation - Continue  Solu-Medrol 80 mg IV every 12 hours   Active Problems: Acute on chronic systolic CHF  - TTE (123456) EF 40-45%, mild LVH, severe LA dilation, mild-moderately elevated PA pressures  - BNP 1500 on admission, was 820 on 02/17/15  - Diuresed with IV Lasix. Due to ongoing renal insufficiency lasix held since 2/21 - Continue daily weight and strict intake and output - Replete electrolytes as needed   Hypokalemia - Supplemented and within normal limits  Acute kidney injury with hyperkalemia  - SCr 1.8 on admission - Likely component of urinary retention  - Please note pyridium c/i in renal insufficiency so we stopped it - Creatinine stabilized at 2.24  Chronic atrial fibrillation  - CHADS vasc score 6 (age, HTN, CHF, h/o TIA) - Rate-controlled with metoprolol 12.5 mg PO BID - Continue anticoagulation with Xarelto   Hyponatremia  - Likely due to dehydration - Resolved with IV fluids  Hypothyroidism -  We will continue Synthroid 125 mcg daily   BPH  - We will continue Flomax, finasteride   Elevated cardiac biomarker - Per cardio, no further interventions at this time   Depression - Stable - Continue Zoloft    DVT Prophylaxis  - Continue xarelto   Code Status: DNR/DNI Family Communication:  plan of care discussed with the patient, called wife over the phone (home and cell number, left message to discuss d/c plan) Disposition Plan: Likely 2/28  IV access:  Peripheral IV  Procedures and diagnostic studies:    Ct Abdomen Pelvis Wo Contrast 03/12/2015 Diffuse interstitial changes in the lung bases stable from the recent CT. Cholelithiasis without complicating factors. Left renal calculi  without obstructive change. Resolution of previously seen right renal calculi. Mild fullness of the right collecting system is noted without definitive stone.   Dg Chest 2 View 03/11/2015 Possible right pneumoperitoneum. CT evaluation is recommended as clinically indicated. Decreased  interstitial and airspace opacities within both lungs.   Ct Chest Wo Contrast 03/06/2015 04/22/2014 CT scan of the abdomen pelvis shows mild interstitial change at the lung bases. Today's study shows much more pronounced interstitial opacification, along with small bilateral pleural effusions. There is cardio megaly with mild venous congestion. This study confirms significant acute interstitial process superimposed on mild chronic interstitial lung disease. The findings would suggest the possibility of congestive heart failure with interstitial edema, but clinical worsening despite diuresis indicates that the interstitial infiltrates seen bilaterally may be due to other etiologies including atypical pneumonia.   US Renal 03/12/2015 No acute findings. No hydronephrosis. Bilateral nephrolithiasis.   Medical Consultants:  PCCM PCT Cardiology   Other Consultants:  PT  IAnti-Infectives:   Vancomycin 2/16 --> 2/19 Maxipime 2/16 --> 2/23 Doxycycline 2/19 --> 2/23   Harold Lenz, MD  [facility] Pager [phone]  Time spent in minutes: 25 minutes  If 7PM-7AM, please contact night-coverage [email or web address] Password [facility] 03/15/2015, 11:33 AM   LOS: 11 days    HPI/Subjective: No acute overnight events. Patient reports he feels better.  Objective: Filed Vitals:   03/14/15 1507 03/14/15 2259 03/15/15 0430 03/15/15 0656  BP: 124/79 131/81 139/61   Pulse: 94 93 80   Temp: 97.4 F (36.3 C) 97.6 F (36.4 C) 97.4 F (36.3 C)   TempSrc: Oral Oral Oral   Resp: 18 18 17    Height:      Weight:    74.3 kg (163 lb 12.8 oz)  SpO2: 93% 97% 96%     Intake/Output Summary (Last 24 hours) at 03/15/15 1133 Last data filed at 03/15/15 0431  Gross per 24 hour  Intake    610 ml  Output   1050 ml  Net   -440 ml    Exam:   General:  Pt is alert, follows commands appropriately, not in acute distress  Cardiovascular: Regular rate and rhythm, S1/S2 (+)  Respiratory: Diminished breath  sounds, wheezing in upper lung lobes   Abdomen: Soft, non tender, non distended, bowel sounds present  Extremities: No edema, pulses palpable bilaterally  Neuro: Grossly nonfocal  Data Reviewed: Basic Metabolic Panel:  Recent Labs Lab 03/11/15 0506 03/12/15 0518 03/13/15 0621 03/14/15 0427 03/15/15 0417  NA 128* 139 136 139 138  K 5.4* 4.4 3.5 3.4* 3.5  CL 96* 104 102 105 105  CO2 21* 21* 22 24 24   GLUCOSE 183* 176* 191* 186* 205*  BUN 69* 77* 79* 87* 81*  CREATININE 2.51* 2.62* 2.46* 2.31* 2.24*  CALCIUM 8.9 9.3 8.6* 8.8* 8.6*   Liver Function Tests: No results for input(s): AST, ALT, ALKPHOS, BILITOT, PROT, ALBUMIN in the last 168 hours. No results for input(s): LIPASE, AMYLASE in the last 168 hours. No results for input(s): AMMONIA in the last 168 hours. CBC:  Recent Labs Lab 03/11/15 0506 03/12/15 0518 03/13/15 0621 03/14/15 0427 03/15/15 0417  WBC 9.2 9.4 9.7 8.1 7.6  HGB 9.1* 9.1* 8.4* 8.2* 8.4*  HCT 27.8* 27.9* 24.9* 24.7* 25.4*  MCV 97.9 97.9 96.9 97.2 98.1  PLT 310 314 262 240 227   Cardiac Enzymes: No results for input(s): CKTOTAL, CKMB, CKMBINDEX, TROPONINI in the last 168 hours. BNP: Invalid input(s): POCBNP CBG: No results for input(s): GLUCAP in  the last 168 hours.  Recent Results (from the past 240 hour(s))  Respiratory virus panel     Status: None   Collection Time: 03/10/15  7:57 PM  Result Value Ref Range Status   Source - RVPAN NASAL WASHINGS  Corrected   Respiratory Syncytial Virus A Negative Negative Final   Respiratory Syncytial Virus B Negative Negative Final   Influenza A Negative Negative Final   Influenza B Negative Negative Final   Parainfluenza 1 Negative Negative Final   Parainfluenza 2 Negative Negative Final   Parainfluenza 3 Negative Negative Final   Metapneumovirus Negative Negative Final   Rhinovirus Negative Negative Final   Adenovirus Negative Negative Final    Comment: (NOTE) Performed At: [facility] [place], [place] [id number] Lindon Romp MD [id number]      Scheduled Meds: . acetaminophen  1,000 mg Oral TID  . aspirin EC  81 mg Oral Daily  . feeding supplement (ENSURE ENLIVE)  237 mL Oral BID BM  . finasteride  5 mg Oral Daily  . levothyroxine  125 mcg Oral QAC breakfast  . lidocaine  1 application Urethral Once  . methylPREDNISolone (SOLU-MEDROL) injection  80 mg Intravenous Q12H  . metoprolol succinate  12.5 mg Oral BID  . midodrine  10 mg Oral TID WC  . mirabegron ER  25 mg Oral Daily  . nystatin-triamcinolone  1 application Topical TID  . Rivaroxaban  15 mg Oral q1800  . saccharomyces boulardii  250 mg Oral q morning - 10a  . sertraline  50 mg Oral Daily  . sodium chloride flush  3 mL Intravenous Q12H  . tamsulosin  0.8 mg Oral Daily

## 2015-03-15 NOTE — Progress Notes (Signed)
Physical Therapy Treatment Patient Details Name: Harold Jennings MRN: [id number] DOB: 11-20-1932 Today's Date: 03/15/2015    History of Present Illness 82 y.o. male with PMH of chronic atrial fibrillation on Xarelto, chronic systolic CHF (EF A999333), hypertension, and lumbar spinal stenosis who presented from his SNF for evaluation of acute hypoxic respiratory failure.    PT Comments    Assisted pt out of recliner back to bed using [facility] Lift due to MAX fatigue and inability to tolerate attempted standing transfer. Increased time to position to comfort in bed.    Follow Up Recommendations        Equipment Recommendations       Recommendations for Other Services       Precautions / Restrictions      Mobility  Bed Mobility    + 2 max assist side to side rolling to remove [employer] from recliner to bed              Ambulation/Gait                 Stairs            Wheelchair Mobility    Modified Rankin (Stroke Patients Only)       Balance                                    Cognition                            Exercises      General Comments        Pertinent Vitals/Pain      Home Living                      Prior Function            PT Goals (current goals can now be found in the care plan section)      Frequency       PT Plan      Co-evaluation             End of Session           Time: 1425-1435 PT Time Calculation (min) (ACUTE ONLY): 10 min  Charges:  $Therapeutic Activity: 8-22 mins                    G Codes:      Rica Koyanagi  PTA [facility]  Acute  Rehab Pager      [phone]

## 2015-03-15 NOTE — Progress Notes (Signed)
CSW met with pt / spoke with spouse to assist with d/c planning. Pt was at [facility] for rehab prior to admission. Spouse does not think pt will be able to participate in rehab at d/c. Spouse reports they are unable to pay out of pocket for SNF placement. Spouse would like to consider home with hospice services. Spouse would like to speak with MD/palliative services. MD notified. CSW will continue to follow to assist with d/c planning needs.  Werner Lean LCSW [phone]

## 2015-03-16 MED ORDER — PHENAZOPYRIDINE HCL 100 MG PO TABS: 100.0000 mg | ORAL_TABLET | Freq: Three times a day (TID) | ORAL | Status: DC

## 2015-03-16 MED ORDER — METHYLPREDNISOLONE SODIUM SUCC 125 MG IJ SOLR
80.0000 mg | INTRAMUSCULAR | Status: DC
  Administered 2015-03-17: 80 mg via INTRAVENOUS
  Filled 2015-03-16: qty 2

## 2015-03-16 NOTE — Care Management Important Message (Signed)
Important Message  Patient Details IM Letter given to Cookie/Case Manager to present to Patient Name: JAMEAR KANIECKI MRN: [id number] Date of Birth: May 30, 1932   Medicare Important Message Given:  Yes    Camillo Flaming 03/16/2015, 12:22 [place] Message  Patient Details  Name: AZION MACERA MRN: [id number] Date of Birth: Dec 28, 1932   Medicare Important Message Given:  Yes    Camillo Flaming 03/16/2015, 12:21 PM

## 2015-03-16 NOTE — Progress Notes (Addendum)
Notified by Marisue Ivan of family request for Hospice and [place] services at home after discharge. Chart and patient information currently under review to confirm hospice eligibility.   Spoke with patient and wife, Diene at bedside to initiate education related to hospice philosophy, services and team approach to care. Family verbalized understanding of the information provided. Per discussion, plan is for discharge to home by [facility] tomorrow.   Staff RN, please demonstrate to wife how to empty the F/C.  The F/C will remain in for comfort at discharge, and wife would like to build confidence in draining the device.  Please send signed completed DNR form home with patient.  Patient will need prescriptions for discharge comfort medications.   DME needs discussed and family requested hospital bed with full rails, oxygen set-up for 8L continuous O2 with concentrator and reclining W/C for delivery to the home this evening.  [facility] equipment manager Jewel Ysidro Evert notified and will contact [facility] to arrange delivery to the home.  The home address has been verified and is correct in the chart; Diene family member to be contacted to arrange time of delivery.   [facility] aware of the above.  Completed discharge summary will need to be faxed to [facility] at [phone] when final.  Please notify [facility] when patient is ready to leave unit at discharge-call [phone].  [facility] information and contact numbers have been given to [place] during visit.   Please call with any questions.   Thank You,  Freddi Starr RN, [facility] Liaison  [phone]

## 2015-03-16 NOTE — Plan of Care (Signed)
Problem: Skin Integrity: Goal: Risk for impaired skin integrity will decrease Outcome: Progressing Nystatin cream applied to excoriated areas on buttocks and groin

## 2015-03-16 NOTE — Progress Notes (Addendum)
Patient ID: Harold Jennings, male   DOB: [birth year], 82 y.o.   MRN: [id number] [facility] PROGRESS NOTE  RAFIQ OILER [id number] DOB: 1932/06/28 DOA: 03/04/2015 PCP: London Pepper, MD  Brief narrative:    82 y.o. male with PMH of chronic atrial fibrillation on Xarelto, chronic systolic CHF (EF [id number]), hypertension and lumbar spinal stenosis who presented from SNF for evaluation of acute hypoxic respiratory failure. Patient was admitted to [facility] from 02/17/2015 - 02/25/2015, during which time he was aggressively diuresed and treated empirically for CAP with azithromycin and Rocephin.  In ED, patient was found to be afebrile, saturating in the 80s on 4 L/m supplemental oxygen and with remaining vital signs stable. Chest x-ray demonstrated diffuse bilateral patchy opacities concerning for pneumonia, edema less likely. The 12 lead EKG featured atrial fibrillation with right bundle branch block and borderline ST depression in leads V4 to V6.  Blood work was notable for mild hyponatremia, serum bicarbonate of 19 and serum creatinine of 1.79 which is up from his baseline of 1. BNP was elevated at 1500 and troponin level was 0.75. CBC was unremarkable.   In the emergency department, the patient was given a 40 mg IV push of Lasix and treated with empiric vancomycin and cefepime. His O2 sat improved to the 90s on oxi-mask, he remained hemodynamically stable.  Major events since admission: 2/18 - still on NRB, unable to wean off, CT chest requested  2/19 - added doxycycline for atypical coverage, also added Lasix 40 mg in addition to QD dosing for better diuresis  2/21 - off NRB, transitioned to [place]  Assessment/Plan:    Principal Problem: Acute on chronic hypoxic respiratory failure / HCAP, bilateral lobes - Please note that the patient completed appropriate antibiotics by 03/11/2015 - Continue oxygen support via [place] to keep O2 sat above 90% - Continue Solu-Medrol 80 mg IV every 24  hours   Active Problems: Acute on chronic systolic CHF  - TTE (123456) EF 40-45%, mild LVH, severe LA dilation, mild-moderately elevated PA pressures  - BNP 1500 on admission, was 820 on 02/17/15  - Diuresed with IV Lasix. Due to ongoing renal insufficiency lasix still on hold since 2/21  Hypokalemia - Supplemented  - Within normal limits  Acute kidney injury with hyperkalemia  - SCr 1.8 on admission - Likely component of urinary retention  - Creatinine  2.24, stable   Bladder spasms - Low dose pyridium restarted but per pharmacy C/I since Cr Cl 25 so i still believe it is reasonable to stop it  Chronic atrial fibrillation  - CHADS vasc score 6 (age, HTN, CHF, h/o TIA) - Rate is controlled with metoprolol 12.5 mg PO BID - Continue anticoagulation with Xarelto   Hyponatremia  - Likely due to dehydration - Resolved with IV fluids  Hypothyroidism -  Continue Synthroid 125 mcg daily   BPH  - Continue Flomax, finasteride   Elevated cardiac biomarker - Per cardio, no further interventions at this time   Depression - Continue Zoloft    DVT Prophylaxis  - Continue anticoagulation with xarelto   Code Status: DNR/DNI Family Communication:  Called wife's cell phone to give update, left message, spoke with her yesterday 2/27 Disposition Plan: Likely 3/1  IV access:  Peripheral IV  Procedures and diagnostic studies:    Ct Abdomen Pelvis Wo Contrast 03/12/2015 Diffuse interstitial changes in the lung bases stable from the recent CT. Cholelithiasis without complicating factors. Left renal calculi without obstructive change. Resolution of previously  seen right renal calculi. Mild fullness of the right collecting system is noted without definitive stone.   Dg Chest 2 View 03/11/2015 Possible right pneumoperitoneum. CT evaluation is recommended as clinically indicated. Decreased interstitial and airspace opacities within both lungs.   Ct Chest Wo Contrast 03/06/2015  04/22/2014 CT scan of the abdomen pelvis shows mild interstitial change at the lung bases. Today's study shows much more pronounced interstitial opacification, along with small bilateral pleural effusions. There is cardio megaly with mild venous congestion. This study confirms significant acute interstitial process superimposed on mild chronic interstitial lung disease. The findings would suggest the possibility of congestive heart failure with interstitial edema, but clinical worsening despite diuresis indicates that the interstitial infiltrates seen bilaterally may be due to other etiologies including atypical pneumonia.   US Renal 03/12/2015 No acute findings. No hydronephrosis. Bilateral nephrolithiasis.   Medical Consultants:  [facility] Cardiology   Other Consultants:  PT  IAnti-Infectives:   Vancomycin 2/16 --> 2/19 Maxipime 2/16 --> 2/23 Doxycycline 2/19 --> 2/23   Leisa Lenz, MD  [facility] Pager [phone]  Time spent in minutes: 15 minutes  If 7PM-7AM, please contact night-coverage [email or web address] Password [facility] 03/16/2015, 11:34 AM   LOS: 12 days    HPI/Subjective: No acute overnight events.   Objective: Filed Vitals:   03/15/15 1841 03/15/15 2137 03/16/15 0555 03/16/15 0940  BP: 134/67 134/79 135/90 135/90  Pulse: 97 92 81 81  Temp: 97.6 F (36.4 C) 97.5 F (36.4 C) 97.5 F (36.4 C)   TempSrc: Oral Oral Oral   Resp: 20 19 19    Height: 5\' 8"  (1.727 m)     Weight: 73.936 kg (163 lb)  73 kg (160 lb 15 oz)   SpO2: 99% 98% 98%     Intake/Output Summary (Last 24 hours) at 03/16/15 1134 Last data filed at 03/16/15 0900  Gross per 24 hour  Intake    480 ml  Output   1500 ml  Net  -1020 ml    Exam:   General:  Pt is not in acute distress, sleeping   Cardiovascular: RRR, S1/S2 appreciated   Respiratory: Diminished breath sounds, no rhonchi   Abdomen: (+) BS, non tender   Extremities: pulses palpable bilaterally  Neuro: Nonfocal  Data  Reviewed: Basic Metabolic Panel:  Recent Labs Lab 03/11/15 0506 03/12/15 0518 03/13/15 0621 03/14/15 0427 03/15/15 0417  NA 128* 139 136 139 138  K 5.4* 4.4 3.5 3.4* 3.5  CL 96* 104 102 105 105  CO2 21* 21* 22 24 24   GLUCOSE 183* 176* 191* 186* 205*  BUN 69* 77* 79* 87* 81*  CREATININE 2.51* 2.62* 2.46* 2.31* 2.24*  CALCIUM 8.9 9.3 8.6* 8.8* 8.6*   Liver Function Tests: No results for input(s): AST, ALT, ALKPHOS, BILITOT, PROT, ALBUMIN in the last 168 hours. No results for input(s): LIPASE, AMYLASE in the last 168 hours. No results for input(s): AMMONIA in the last 168 hours. CBC:  Recent Labs Lab 03/11/15 0506 03/12/15 0518 03/13/15 0621 03/14/15 0427 03/15/15 0417  WBC 9.2 9.4 9.7 8.1 7.6  HGB 9.1* 9.1* 8.4* 8.2* 8.4*  HCT 27.8* 27.9* 24.9* 24.7* 25.4*  MCV 97.9 97.9 96.9 97.2 98.1  PLT 310 314 262 240 227   Cardiac Enzymes: No results for input(s): CKTOTAL, CKMB, CKMBINDEX, TROPONINI in the last 168 hours. BNP: Invalid input(s): POCBNP CBG: No results for input(s): GLUCAP in the last 168 hours.  Recent Results (from the past 240 hour(s))  Respiratory virus panel  Status: None   Collection Time: 03/10/15  7:57 PM  Result Value Ref Range Status   Source - RVPAN NASAL WASHINGS  Corrected   Respiratory Syncytial Virus A Negative Negative Final   Respiratory Syncytial Virus B Negative Negative Final   Influenza A Negative Negative Final   Influenza B Negative Negative Final   Parainfluenza 1 Negative Negative Final   Parainfluenza 2 Negative Negative Final   Parainfluenza 3 Negative Negative Final   Metapneumovirus Negative Negative Final   Rhinovirus Negative Negative Final   Adenovirus Negative Negative Final    Comment: (NOTE) Performed At: [facility] [place], [place] [id number] Lindon Romp MD [id number]      Scheduled Meds: . acetaminophen  1,000 mg Oral TID  . aspirin EC  81 mg Oral Daily  . feeding  supplement (ENSURE ENLIVE)  237 mL Oral BID BM  . finasteride  5 mg Oral Daily  . levothyroxine  125 mcg Oral QAC breakfast  . lidocaine  1 application Urethral Once  . methylPREDNISolone (SOLU-MEDROL) injection  80 mg Intravenous Q12H  . metoprolol succinate  12.5 mg Oral BID  . midodrine  10 mg Oral TID WC  . mirabegron ER  25 mg Oral Daily  . nystatin-triamcinolone  1 application Topical TID  . phenazopyridine  100 mg Oral TID WC  . Rivaroxaban  15 mg Oral q1800  . saccharomyces boulardii  250 mg Oral q morning - 10a  . sertraline  50 mg Oral Daily  . sodium chloride flush  3 mL Intravenous Q12H  . tamsulosin  0.8 mg Oral Daily

## 2015-03-16 NOTE — Care Management Note (Addendum)
Case Management Note  Patient Details  Name: Harold Jennings MRN: [id number] Date of Birth: 1932-01-19  Subjective/Objective:                  Acute on chronic hypoxic respiratory failure / HCAP, bilateral lobes Action/Plan: Discharge planning Expected Discharge Date:   (unknown)               Expected Discharge Plan:  Home w Hospice Care  In-House Referral:     Discharge planning Services  CM Consult  Post Acute Care Choice:  Hospice Choice offered to:  Spouse, Adult Children  DME Arranged:  Oxygen, Hospital bed DME Agency:     HH Arranged:    [place] Agency:     Status of Service:  Completed, signed off  Medicare Important Message Given:  Yes Date Medicare IM Given:    Medicare IM give by:    Date Additional Medicare IM Given:    Additional Medicare Important Message give by:     If discussed at [name] of Stay Meetings, dates discussed:    Additional Comments: CM notes palliative care directive for pt to transition home with [facility]; CM requests of attending to place the actual CM consult for home with hospice as consult is for home health.  CM has spoken with Vickie of [place] who states she will have onsite Hospice rep, Lattie Haw take a look at pt and palliative intent and is aware CM has requested appropriate consult for Home with Hospice.  Current plan is for another [place] meeting this afternoon at 16:00 which CM made [facility] aware.  Will continue to monitor for needs. Dellie Catholic, RN 03/16/2015, 2:24 PM

## 2015-03-16 NOTE — Plan of Care (Signed)
Problem: Bowel/Gastric: Goal: Will not experience complications related to bowel motility Outcome: Progressing LBM 2/26

## 2015-03-16 NOTE — Progress Notes (Signed)
Daily Progress Note   Patient Name: Harold Jennings       Date: 03/16/2015 DOB: 02/12/1932  Age: 82 y.o. MRN#: [id number] Attending Physician: Robbie Lis, MD Primary Care Physician: London Pepper, MD Admit Date: 03/04/2015  Reason for Consultation/Follow-up: Disposition, Establishing goals of care and Pain control  Subjective: Sleeping much more. Periods of confusion. Severely debilitated. Pain is improved.  Length of Stay: 12 days  Current Medications: Scheduled Meds:  . acetaminophen  1,000 mg Oral TID  . aspirin EC  81 mg Oral Daily  . feeding supplement (ENSURE ENLIVE)  237 mL Oral BID BM  . finasteride  5 mg Oral Daily  . levothyroxine  125 mcg Oral QAC breakfast  . lidocaine  1 application Urethral Once  . methylPREDNISolone (SOLU-MEDROL) injection  80 mg Intravenous Q12H  . metoprolol succinate  12.5 mg Oral BID  . midodrine  10 mg Oral TID WC  . mirabegron ER  25 mg Oral Daily  . nystatin-triamcinolone  1 application Topical TID  . Rivaroxaban  15 mg Oral q1800  . saccharomyces boulardii  250 mg Oral q morning - 10a  . sertraline  50 mg Oral Daily  . sodium chloride flush  3 mL Intravenous Q12H  . tamsulosin  0.8 mg Oral Daily    Continuous Infusions:    PRN Meds: sodium chloride, HYDROmorphone (DILAUDID) injection, levalbuterol, lip balm, loperamide, LORazepam, ondansetron (ZOFRAN) IV, sodium chloride flush  Physical Exam: Physical Exam              Vital Signs: BP 135/90 mmHg  Pulse 81  Temp(Src) 97.5 F (36.4 C) (Oral)  Resp 19  Ht 5\' 8"  (1.727 m)  Wt 73 kg (160 lb 15 oz)  BMI 24.48 kg/m2  SpO2 98% SpO2: SpO2: 98 % O2 Device: O2 Device: Nasal Cannula O2 Flow Rate: O2 Flow Rate (L/min): 8 L/min  Intake/output summary:  Intake/Output Summary (Last  24 hours) at 03/16/15 0641 Last data filed at 03/16/15 0601  Gross per 24 hour  Intake      0 ml  Output   1300 ml  Net  -1300 ml   LBM: Last BM Date: 03/14/15 Baseline Weight: Weight: 74.6 kg (164 lb 7.4 oz) Most recent weight: Weight: 73 kg (160 lb 15 oz)  Palliative Assessment/Data: Flowsheet Rows        Most Recent Value   Intake Tab    Referral Department  Hospitalist   Unit at Time of Referral  Cardiac/Telemetry Unit   Palliative Care Primary Diagnosis  Pulmonary   Date Notified  03/06/15   Palliative Care Type  New Palliative care   Reason for referral  Clarify Goals of Care   Date of Admission  03/04/15   Date first seen by Palliative Care  03/08/15   # of days Palliative referral response time  2 Day(s)   # of days IP prior to Palliative referral  2   Clinical Assessment    Palliative Performance Scale Score  50%   Pain Max last 24 hours  0   Pain Min Last 24 hours  0   Dyspnea Max Last 24 Hours  0   Dyspnea Min Last 24 hours  0   Psychosocial & Spiritual Assessment    Palliative Care Outcomes    Patient/Family meeting held?  Yes   Who was at the meeting?  Patient, wife   Palliative Care Outcomes  Clarified goals of care   Palliative Care follow-up planned  Yes, Home      Additional Data Reviewed: CBC    Component Value Date/Time   WBC 7.6 03/15/2015 0417   RBC 2.59* 03/15/2015 0417   HGB 8.4* 03/15/2015 0417   HCT 25.4* 03/15/2015 0417   PLT 227 03/15/2015 0417   MCV 98.1 03/15/2015 0417   MCH 32.4 03/15/2015 0417   MCHC 33.1 03/15/2015 0417   RDW 16.9* 03/15/2015 0417   LYMPHSABS 1.8 03/05/2015 0147   MONOABS 1.1* 03/05/2015 0147   EOSABS 0.4 03/05/2015 0147   BASOSABS 0.0 03/05/2015 0147    CMP     Component Value Date/Time   NA 138 03/15/2015 0417   K 3.5 03/15/2015 0417   CL 105 03/15/2015 0417   CO2 24 03/15/2015 0417   GLUCOSE 205* 03/15/2015 0417   BUN 81* 03/15/2015 0417   CREATININE 2.24* 03/15/2015 0417   CALCIUM 8.6*  03/15/2015 0417   PROT 7.3 02/17/2015 1553   ALBUMIN 3.4* 02/17/2015 1553   AST 43* 02/17/2015 1553   ALT 38 02/17/2015 1553   ALKPHOS 216* 02/17/2015 1553   BILITOT 1.0 02/17/2015 1553   GFRNONAA 26* 03/15/2015 0417   GFRAA 30* 03/15/2015 0417       Problem List:  Patient Active Problem List   Diagnosis Date Noted  . ILD (interstitial lung disease) ([place])   . Dyspnea   . Acute respiratory failure with hypoxemia ([place])   . Acute on chronic congestive heart failure ([place])   . Acute pneumonitis   . Goals of care, counseling/discussion   . Palliative care encounter   . Pressure ulcer 03/05/2015  . Malnutrition of moderate degree 03/05/2015  . Elevated troponin   . CHF (congestive heart failure) ([place]) 03/04/2015  . Acute on chronic systolic CHF (congestive heart failure) ([place]) 03/04/2015  . HCAP (healthcare-associated pneumonia) 03/04/2015  . Hyponatremia 03/04/2015  . Acute respiratory failure with hypoxia ([place]) 03/04/2015  . Diarrhea 03/03/2015  . Edema 03/03/2015  . TIA (transient ischemic attack)   . UTI (lower urinary tract infection) 02/17/2015  . CAP (community acquired pneumonia) 02/17/2015  . Acute on chronic combined systolic and diastolic congestive heart failure ([place]) 02/17/2015  . Slurred speech 02/17/2015  . Dizziness 02/17/2015  . ARF (acute renal failure) ([place]) 07/12/2014  . BPH (benign prostatic hyperplasia) 07/12/2014  .  Fall 07/12/2014  . Hypothyroidism 07/12/2014  . Colovesical fistula 06/11/2014  . Chronic atrial fibrillation ([place]) 01/14/2014  . Chronic combined systolic and diastolic congestive heart failure ([place]) 01/14/2014  . Merkel cell carcinoma ([place]) 06/30/2013  . Dyslipidemia 05/23/2008  . Essential hypertension 05/23/2008     Palliative Care Assessment & Plan   I had extensive discussion with Diene his wife today about "[name]'s" condition. They have been talking about transitioning to comfort care. His QOL has deteriortaed dramatically over  the past 3 months and they feel that this admission is not recoverable to the degree that would be acceptable for him. He is near total care and struggles with pain.  Plan for now is to transition home with hospice care on 2/29. She wants [facility].   Needs DME, bed, O2 concentrator etc...  Will contionue to adjust his pain medication over the next 24 hours and discuss with them what meds should be continued and those that should be stopped.  He is currently recieveing IV dilaudid-will transition this to Roxanol- likely scheduled.  Wife believes Pyridium is most help[ful for his bladder pain- noted stopped due to renal function-can consider restarting at a very low dose.  No SNF- now or in the future.   His son will see him in AM and then Diene will be taking him to the airport to return home- We plan to meet at 4PM  To discuss additional goals and the transition home. Will go [facility] and place Cm order for hospitce referral and begin getting things set up at home.     Prognosis: <3 months (possibly much less depending on whart meds he opts to discontinue)   Care plan was discussed with wife.  Thank you for allowing the Palliative Medicine Team to assist in the care of this patient.   Time In: 7:30 Time Out: 8:30PM Total Time 60 minutes Prolonged Time Billed no        Acquanetta Chain, DO  03/16/2015, 6:41 AM  Please contact Palliative Medicine Team phone at [phone] for questions and concerns.

## 2015-03-16 NOTE — Plan of Care (Signed)
Problem: Physical Regulation: Goal: Ability to maintain clinical measurements within normal limits will improve Outcome: Not Progressing Still requires O2 at 8L via [facility]

## 2015-03-17 MED ORDER — MORPHINE SULFATE (CONCENTRATE) 10 MG/0.5ML PO SOLN: 10.0000 mg | ORAL | Status: AC | PRN

## 2015-03-17 MED ORDER — LORAZEPAM 0.5 MG PO TABS: 0.5000 mg | ORAL_TABLET | ORAL | Status: DC | PRN

## 2015-03-17 MED ORDER — PREDNISONE 10 MG PO TABS
50.0000 mg | ORAL_TABLET | Freq: Every day | ORAL | Status: AC
Start: 2015-03-17 — End: ?

## 2015-03-17 MED ORDER — MORPHINE SULFATE (CONCENTRATE) 10 MG/0.5ML PO SOLN: 10.0000 mg | ORAL | Status: DC | PRN

## 2015-03-17 MED ORDER — MIRABEGRON ER 25 MG PO TB24: 25.0000 mg | ORAL_TABLET | Freq: Every day | ORAL | Status: AC

## 2015-03-17 MED ORDER — TAMSULOSIN HCL 0.4 MG PO CAPS: 0.8000 mg | ORAL_CAPSULE | Freq: Every day | ORAL | Status: AC

## 2015-03-17 MED ORDER — ENSURE ENLIVE PO LIQD: 237.0000 mL | Freq: Two times a day (BID) | ORAL | Status: AC

## 2015-03-17 MED ORDER — LORAZEPAM 0.5 MG PO TABS: 0.5000 mg | ORAL_TABLET | ORAL | Status: AC | PRN

## 2015-03-17 MED ORDER — ONDANSETRON HCL 4 MG PO TABS: 4.0000 mg | ORAL_TABLET | Freq: Three times a day (TID) | ORAL | Status: AC | PRN

## 2015-03-17 MED ORDER — ONDANSETRON HCL 4 MG PO TABS: 4.0000 mg | ORAL_TABLET | Freq: Three times a day (TID) | ORAL | Status: DC | PRN

## 2015-03-17 MED ORDER — RIVAROXABAN 15 MG PO TABS: 15.0000 mg | ORAL_TABLET | Freq: Every day | ORAL | Status: AC

## 2015-03-17 MED ORDER — ACETAMINOPHEN 500 MG PO TABS: 1000.0000 mg | ORAL_TABLET | Freq: Three times a day (TID) | ORAL | Status: AC

## 2015-03-17 NOTE — Progress Notes (Signed)
Daily Progress Note   Patient Name: Harold Jennings       Date: 03/17/2015 DOB: Jan 11, 1933  Age: 82 y.o. MRN#: [id number] Attending Physician: Robbie Lis, MD Primary Care Physician: London Pepper, MD Admit Date: 03/04/2015  Reason for Consultation/Follow-up: Disposition, Establishing goals of care and Pain control  Subjective: Pain is improved. Severe weakness and exertional dyspnea. Interval Events: Palliative consult 2/20 Length of Stay: 13 days  Current Medications: Scheduled Meds:  . acetaminophen  1,000 mg Oral TID  . aspirin EC  81 mg Oral Daily  . feeding supplement (ENSURE ENLIVE)  237 mL Oral BID BM  . finasteride  5 mg Oral Daily  . levothyroxine  125 mcg Oral QAC breakfast  . lidocaine  1 application Urethral Once  . methylPREDNISolone (SOLU-MEDROL) injection  80 mg Intravenous Q24H  . metoprolol succinate  12.5 mg Oral BID  . midodrine  10 mg Oral TID WC  . mirabegron ER  25 mg Oral Daily  . nystatin-triamcinolone  1 application Topical TID  . Rivaroxaban  15 mg Oral q1800  . saccharomyces boulardii  250 mg Oral q morning - 10a  . sertraline  50 mg Oral Daily  . sodium chloride flush  3 mL Intravenous Q12H  . tamsulosin  0.8 mg Oral Daily    Continuous Infusions:    PRN Meds: sodium chloride, HYDROmorphone (DILAUDID) injection, levalbuterol, lip balm, loperamide, LORazepam, ondansetron (ZOFRAN) IV, sodium chloride flush  Physical Exam: Physical Exam              Vital Signs: BP 95/66 mmHg  Pulse 85  Temp(Src) 97.4 F (36.3 C) (Oral)  Resp 18  Ht 5\' 8"  (1.727 m)  Wt 73 kg (160 lb 15 oz)  BMI 24.48 kg/m2  SpO2 98% SpO2: SpO2: 98 % O2 Device: O2 Device: Nasal Cannula, NRB O2 Flow Rate: O2 Flow Rate (L/min): 8 L/min  Intake/output summary:    Intake/Output Summary (Last 24 hours) at 03/17/15 0754 Last data filed at 03/17/15 0648  Gross per 24 hour  Intake   1540 ml  Output   2025 ml  Net   -485 ml   LBM: Last BM Date: 03/14/15 Baseline Weight: Weight: 74.6 kg (164 lb 7.4 oz) Most recent weight: Weight: 73 kg (160 lb 15 oz)  Palliative Assessment/Data: Flowsheet Rows        Most Recent Value   Intake Tab    Referral Department  Hospitalist   Unit at Time of Referral  Cardiac/Telemetry Unit   Palliative Care Primary Diagnosis  Pulmonary   Date Notified  03/06/15   Palliative Care Type  New Palliative care   Reason for referral  Clarify Goals of Care   Date of Admission  03/04/15   Date first seen by Palliative Care  03/08/15   # of days Palliative referral response time  2 Day(s)   # of days IP prior to Palliative referral  2   Clinical Assessment    Palliative Performance Scale Score  30%   Pain Max last 24 hours  4   Pain Min Last 24 hours  0   Dyspnea Max Last 24 Hours  5   Dyspnea Min Last 24 hours  0   Nausea Max Last 24 Hours  0   Nausea Min Last 24 Hours  0   Anxiety Max Last 24 Hours  0   Anxiety Min Last 24 Hours  0   Other Max Last 24 Hours  Not able to report   Psychosocial & Spiritual Assessment    Palliative Care Outcomes    Patient/Family meeting held?  Yes   Who was at the meeting?  Patient, wife   Palliative Care Outcomes  Clarified goals of care   Palliative Care follow-up planned  Yes, Home   Actual Discharge Date  03/17/15      Additional Data Reviewed: CBC    Component Value Date/Time   WBC 7.6 03/15/2015 0417   RBC 2.59* 03/15/2015 0417   HGB 8.4* 03/15/2015 0417   HCT 25.4* 03/15/2015 0417   PLT 227 03/15/2015 0417   MCV 98.1 03/15/2015 0417   MCH 32.4 03/15/2015 0417   MCHC 33.1 03/15/2015 0417   RDW 16.9* 03/15/2015 0417   LYMPHSABS 1.8 03/05/2015 0147   MONOABS 1.1* 03/05/2015 0147   EOSABS 0.4 03/05/2015 0147   BASOSABS 0.0 03/05/2015 0147    CMP      Component Value Date/Time   NA 138 03/15/2015 0417   K 3.5 03/15/2015 0417   CL 105 03/15/2015 0417   CO2 24 03/15/2015 0417   GLUCOSE 205* 03/15/2015 0417   BUN 81* 03/15/2015 0417   CREATININE 2.24* 03/15/2015 0417   CALCIUM 8.6* 03/15/2015 0417   PROT 7.3 02/17/2015 1553   ALBUMIN 3.4* 02/17/2015 1553   AST 43* 02/17/2015 1553   ALT 38 02/17/2015 1553   ALKPHOS 216* 02/17/2015 1553   BILITOT 1.0 02/17/2015 1553   GFRNONAA 26* 03/15/2015 0417   GFRAA 30* 03/15/2015 0417       Problem List:  Patient Active Problem List   Diagnosis Date Noted  . ILD (interstitial lung disease) ([place])   . Dyspnea   . Acute respiratory failure with hypoxemia ([place])   . Acute on chronic congestive heart failure ([place])   . Acute pneumonitis   . Goals of care, counseling/discussion   . Palliative care encounter   . Pressure ulcer 03/05/2015  . Malnutrition of moderate degree 03/05/2015  . Elevated troponin   . CHF (congestive heart failure) ([place]) 03/04/2015  . Acute on chronic systolic CHF (congestive heart failure) ([place]) 03/04/2015  . HCAP (healthcare-associated pneumonia) 03/04/2015  . Hyponatremia 03/04/2015  . Acute respiratory failure with hypoxia ([place]) 03/04/2015  . Diarrhea 03/03/2015  . Edema 03/03/2015  . TIA (transient ischemic attack)   .  UTI (lower urinary tract infection) 02/17/2015  . CAP (community acquired pneumonia) 02/17/2015  . Acute on chronic combined systolic and diastolic congestive heart failure ([place]) 02/17/2015  . Slurred speech 02/17/2015  . Dizziness 02/17/2015  . ARF (acute renal failure) ([place]) 07/12/2014  . BPH (benign prostatic hyperplasia) 07/12/2014  . Fall 07/12/2014  . Hypothyroidism 07/12/2014  . Colovesical fistula 06/11/2014  . Chronic atrial fibrillation ([place]) 01/14/2014  . Chronic combined systolic and diastolic congestive heart failure ([place]) 01/14/2014  . Merkel cell carcinoma ([place]) 06/30/2013  . Dyslipidemia 05/23/2008  . Essential  hypertension 05/23/2008     Palliative Care Assessment & Plan   Home with hospice on 3/1. Will need script for Roxanol 10mg  SL q2 PRN pain/dyspnea dispense:70ml. DME ordered and will be delivered today.  1.Code Status:  DNR    Code Status Orders        Start     Ordered   03/04/15 2252  Do not attempt resuscitation (DNR)   Continuous    Question Answer Comment  In the event of cardiac or respiratory ARREST Do not call a "code blue"   In the event of cardiac or respiratory ARREST Do not perform Intubation, CPR, defibrillation or ACLS   In the event of cardiac or respiratory ARREST Use medication by any route, position, wound care, and other measures to relive pain and suffering. May use oxygen, suction and manual treatment of airway obstruction as needed for comfort.      03/04/15 2257    Code Status History    Date Active Date Inactive Code Status Order ID Comments User Context   02/19/2015  8:46 AM 02/25/2015  5:59 PM Full Code [id number]  Jonetta Osgood, MD Inpatient   02/17/2015  6:27 PM 02/19/2015  8:46 AM DNR [id number]  Lavina Hamman, MD ED   07/12/2014  5:02 PM 07/14/2014  3:27 PM Full Code [id number]  Robbie Lis, MD Inpatient   06/12/2014  1:12 PM 06/23/2014  3:25 PM Full Code [id number]  Armandina Gemma, MD Inpatient   04/09/2014 12:59 AM 04/11/2014  2:52 PM Full Code [id number]  Lavina Hamman, MD ED   01/14/2014 11:39 PM 01/19/2014  6:56 PM Full Code [id number]  Rise Patience, MD Inpatient    Advance Directive Documentation        Most Recent Value   Type of Advance Directive  Living will, Healthcare Power of Attorney   Pre-existing out of facility DNR order (yellow form or pink MOST form)     "MOST" Form in Place?         2. Goals of Care/Additional Recommendations:  Return home with focus on QOL, continue current meds for now- may consider stopping some meds.  Limitations on Scope of Treatment: Full Comfort Care  Desire for further Chaplaincy  support:yes  Psycho-social Needs: Caregiving  Support/Resources and Education on Hospice  3. Symptom Management: 1.Urinary Symptoms: Wife request Pyridium PRN despite renal function, continue Mirabeq for bladder spasms/anticholinergic.  2. Dyspnea: Roxanol as above.   4. Palliative Prophylaxis:   Bowel Regimen, Delirium Protocol, Frequent Pain Assessment, Oral Care and Turn Reposition  5. Prognosis: < 3 months  6. Discharge Planning:  Home with Hospice   Care plan was discussed with patient, his wife and RN.    Thank you for allowing the Palliative Medicine Team to assist in the care of this patient.   Time In: 4PM Time Out: 445 Total Time 45 minutes Prolonged Time Billed no  Acquanetta Chain, DO  03/17/2015, 7:54 AM  Please contact Palliative Medicine Team phone at [phone] for questions and concerns.

## 2015-03-17 NOTE — Progress Notes (Signed)
Discharge teaching completed with patient and wife.  Wife competently emptied Foley bag as return demo.   PTAR here---pt discharged to home.

## 2015-03-17 NOTE — Discharge Summary (Signed)
Physician Discharge Summary  Harold Jennings [id number] DOB: [birth year] DOA: 03/04/2015  PCP: London Pepper, MD  Admit date: 03/04/2015 Discharge date: 03/17/2015  Recommendations for Outpatient Follow-up:  Taper prednisone from 50 mg a day to 0 mg, taper down by 10 mg a day (so for ex, take 50 mg on 3/1, then 40 mg 3/2, then 30 mg 3/3 and so on until 0 mg and then stop.  Hospital bed and oxygen order placed  Home with hospice   Discharge Diagnoses:  Principal Problem:   Acute respiratory failure with hypoxia ([place]) Active Problems:   ARF (acute renal failure) (HCC)   Essential hypertension   Chronic atrial fibrillation (HCC)   Hypothyroidism   Acute on chronic systolic CHF (congestive heart failure) (HCC)   HCAP (healthcare-associated pneumonia)   Hyponatremia   Pressure ulcer   Elevated troponin   Malnutrition of moderate degree   Acute on chronic congestive heart failure (HCC)   Acute pneumonitis   Goals of care, counseling/discussion   Palliative care encounter   ILD (interstitial lung disease) ([place])   Dyspnea   Acute respiratory failure with hypoxemia ([place])    Discharge Condition: stable   Diet recommendation: as tolerated   History of present illness:  82 y.o. male with PMH of chronic atrial fibrillation on Xarelto, chronic systolic CHF (EF [id number]), hypertension and lumbar spinal stenosis who presented from SNF for evaluation of acute hypoxic respiratory failure. Patient was admitted to [facility] from 02/17/2015 - 02/25/2015, during which time he was aggressively diuresed and treated empirically for CAP with azithromycin and Rocephin.  In ED, patient was found to be afebrile, saturating in the 80s on 4 L/m supplemental oxygen and with remaining vital signs stable. Chest x-ray demonstrated diffuse bilateral patchy opacities concerning for pneumonia, edema less likely. The 12 lead EKG featured atrial fibrillation with right bundle branch block and borderline ST  depression in leads V4 to V6.  Blood work was notable for mild hyponatremia, serum bicarbonate of 19 and serum creatinine of 1.79 which is up from his baseline of 1. BNP was elevated at 1500 and troponin level was 0.75. CBC was unremarkable.   In the emergency department, the patient was given a 40 mg IV push of Lasix and treated with empiric vancomycin and cefepime. His O2 sat improved to the 90s on oxi-mask, he remained hemodynamically stable.  Major events since admission: 2/18 - still on NRB, unable to wean off, CT chest requested  2/19 - added doxycycline for atypical coverage, also added Lasix 40 mg in addition to QD dosing for better diuresis  2/21 - off NRB, transitioned to [facility] Course:   Assessment/Plan:    Principal Problem: Acute on chronic hypoxic respiratory failure / HCAP, bilateral lobes - Please note that the patient completed appropriate antibiotics by 03/11/2015 - Continue oxygen support via [place] to keep O2 sat above 90% - Taper prednisone from 50 mg a day to 0 mg, taper down by 10 mg a day (so for ex, take 50 mg on 3/1, then 40 mg 3/2, then 30 mg 3/3 and so on until 0 mg and then stop   Active Problems: Acute on chronic systolic CHF  - TTE (123456) EF 40-45%, mild LVH, severe LA dilation, mild-moderately elevated PA pressures  - BNP 1500 on admission, was 820 on 02/17/15  - Diuresed with IV Lasix. Due to ongoing renal insufficiency lasix remains on hold since 2/21  Hypokalemia - Supplemented  - Within normal limits  Acute kidney injury with hyperkalemia  - SCr 1.8 on admission - Likely component of urinary retention  - Creatinine 2.24, stable  - Will still hold lasix   Bladder spasms - Low dose pyridium restarted but per pharmacy C/I since Cr Cl 25 so i still believe it is reasonable to stop it  Chronic atrial fibrillation  - CHADS vasc score 6 (age, HTN, CHF, h/o TIA) - Continue metoprolol 12.5 mg PO BID - Continue Xarelto    Hyponatremia  - Likely due to dehydration - Resolved with IV fluids  Hypothyroidism - Continue Synthroid 125 mcg daily   BPH  - Continue Flomax, finasteride   Elevated cardiac biomarker - Per cardio, no further interventions at this time   Depression - Continue Zoloft    DVT Prophylaxis  - Continue xarelto   Code Status: DNR/DNI Family Communication: Spoke with pt wife 2/28, agreed with d/c plan   IV access:  Peripheral IV  Procedures and diagnostic studies:   Ct Abdomen Pelvis Wo Contrast 03/12/2015 Diffuse interstitial changes in the lung bases stable from the recent CT. Cholelithiasis without complicating factors. Left renal calculi without obstructive change. Resolution of previously seen right renal calculi. Mild fullness of the right collecting system is noted without definitive stone.   Dg Chest 2 View 03/11/2015 Possible right pneumoperitoneum. CT evaluation is recommended as clinically indicated. Decreased interstitial and airspace opacities within both lungs.   Ct Chest Wo Contrast 03/06/2015 04/22/2014 CT scan of the abdomen pelvis shows mild interstitial change at the lung bases. Today's study shows much more pronounced interstitial opacification, along with small bilateral pleural effusions. There is cardio megaly with mild venous congestion. This study confirms significant acute interstitial process superimposed on mild chronic interstitial lung disease. The findings would suggest the possibility of congestive heart failure with interstitial edema, but clinical worsening despite diuresis indicates that the interstitial infiltrates seen bilaterally may be due to other etiologies including atypical pneumonia.   US Renal 03/12/2015 No acute findings. No hydronephrosis. Bilateral nephrolithiasis.   Medical Consultants:  PCCM PCT Cardiology   Other Consultants:  PT  IAnti-Infectives:   Vancomycin 2/16 --> 2/19 Maxipime 2/16 -->  2/23 Doxycycline 2/19 --> 2/23 Paden Senger, MD  [facility] 03/17/2015, 9:43 AM  Pager #: [phone]  Time spent in minutes: less than 30 minutes    Discharge Exam: Filed Vitals:   03/16/15 2100 03/17/15 0440  BP: 139/64 95/66  Pulse:  85  Temp: 97.2 F (36.2 C) 97.4 F (36.3 C)  Resp: 18 18   Filed Vitals:   03/16/15 0940 03/16/15 1335 03/16/15 2100 03/17/15 0440  BP: 135/90 133/81 139/64 95/66  Pulse: 81 87  85  Temp:  98.6 F (37 C) 97.2 F (36.2 C) 97.4 F (36.3 C)  TempSrc:  Oral Oral Oral  Resp:  18 18 18   Height:      Weight:      SpO2:  97% 98% 98%    General: Pt is alert, follows commands appropriately, not in acute distress Cardiovascular: Regular rate and rhythm, S1/S2 + Respiratory: Diminished, no wheezing Abdominal: Soft, non tender, non distended, bowel sounds +, no guarding Extremities: no edema, no cyanosis, pulses palpable bilaterally DP and PT Neuro: Grossly nonfocal  Discharge Instructions  Discharge Instructions    Call MD for:  difficulty breathing, headache or visual disturbances    Complete by:  As directed      Call MD for:  persistant dizziness or light-headedness    Complete by:  As directed      Call MD for:  persistant nausea and vomiting    Complete by:  As directed      Call MD for:  severe uncontrolled pain    Complete by:  As directed      Diet - low sodium heart healthy    Complete by:  As directed      Discharge instructions    Complete by:  As directed   Taper prednisone from 50 mg a day to 0 mg, taper down by 10 mg a day (so for ex, take 50 mg on 3/1, then 40 mg 3/2, then 30 mg 3/3 and so on until 0 mg and then stop.     Increase activity slowly    Complete by:  As directed             Medication List    STOP taking these medications        cefpodoxime 200 MG tablet  Commonly known as:  VANTIN     ezetimibe 10 MG tablet  Commonly known as:  ZETIA     furosemide 20 MG tablet  Commonly known as:   LASIX     midodrine 10 MG tablet  Commonly known as:  PROAMATINE     phenazopyridine 200 MG tablet  Commonly known as:  PYRIDIUM     silodosin 8 MG Caps capsule  Commonly known as:  RAPAFLO  Replaced by:  tamsulosin 0.4 MG Caps capsule      TAKE these medications        acetaminophen 500 MG tablet  Commonly known as:  TYLENOL  Take 2 tablets (1,000 mg total) by mouth 3 (three) times daily.     aspirin 81 MG EC tablet  Take 1 tablet (81 mg total) by mouth daily.     feeding supplement (ENSURE ENLIVE) Liqd  Take 237 mLs by mouth 2 (two) times daily between meals.     finasteride 5 MG tablet  Commonly known as:  PROSCAR  Take 5 mg by mouth daily.     levothyroxine 125 MCG tablet  Commonly known as:  SYNTHROID, LEVOTHROID  Take 1 tablet (125 mcg total) by mouth daily before breakfast.     loperamide 1 MG/5ML solution  Commonly known as:  IMODIUM  Take 10 mLs (2 mg total) by mouth every 6 (six) hours as needed for diarrhea or loose stools.     LORazepam 0.5 MG tablet  Commonly known as:  ATIVAN  Take 1 tablet (0.5 mg total) by mouth every 4 (four) hours as needed for anxiety or sleep.     metoprolol succinate 25 MG 24 hr tablet  Commonly known as:  TOPROL-XL  Take 25 mg by mouth 2 (two) times daily.     mirabegron ER 25 MG Tb24 tablet  Commonly known as:  MYRBETRIQ  Take 1 tablet (25 mg total) by mouth daily.     morphine CONCENTRATE 10 MG/0.5ML Soln concentrated solution  Take 0.5 mLs (10 mg total) by mouth every 2 (two) hours as needed for moderate pain, severe pain, anxiety or shortness of breath.     nystatin-triamcinolone cream  Commonly known as:  MYCOLOG II  Apply 1 application topically 3 (three) times daily. Apply to penis scrotum buttock and thighs every shift for rash     ondansetron 4 MG tablet  Commonly known as:  ZOFRAN  Take 1 tablet (4 mg total) by mouth every 8 (eight) hours as needed for  nausea or vomiting.     predniSONE 10 MG tablet   Commonly known as:  DELTASONE  Take 5 tablets (50 mg total) by mouth daily with breakfast.     Rivaroxaban 15 MG Tabs tablet  Commonly known as:  XARELTO  Take 1 tablet (15 mg total) by mouth daily at 6 PM.     saccharomyces boulardii 250 MG capsule  Commonly known as:  FLORASTOR  Take 250 mg by mouth every morning.     sertraline 50 MG tablet  Commonly known as:  ZOLOFT  Take 50 mg by mouth daily.     tamsulosin 0.4 MG Caps capsule  Commonly known as:  FLOMAX  Take 2 capsules (0.8 mg total) by mouth daily.           Follow-up Information    Follow up with London Pepper, MD. Schedule an appointment as soon as possible for a visit in 2 weeks.   Specialty:  Family Medicine   Why:  Follow up appt after recent hospitalization   Contact information:   [address] [place] [address] [phone]        The results of significant diagnostics from this hospitalization (including imaging, microbiology, ancillary and laboratory) are listed below for reference.    Significant Diagnostic Studies: Ct Abdomen Pelvis Wo Contrast  03/12/2015  CLINICAL DATA:  Abdominal distension and pain EXAM:  .: EXAM:  . CT ABDOMEN AND PELVIS WITHOUT CONTRAST TECHNIQUE: Multidetector CT imaging of the abdomen and pelvis was performed following the standard protocol without IV contrast. COMPARISON:  04/22/2014, 03/12/2015, 03/06/2015 FINDINGS: The lung bases show diffuse interstitial changes similar to that seen on recent CT of the chest. The previously seen pleural effusions have resolved in the interval. The liver is well visualized and within normal limits. The gallbladder is well distended and demonstrates multiple gallstones. The spleen and adrenal glands are within normal limits. The pancreas demonstrates diffuse atrophy and scattered calcifications consistent with chronic pancreatitis. The right kidney demonstrates mild fullness of the collecting system and ureter although no  obstructing lesion is seen. The left kidney again demonstrates lower pole and upper pole renal calculi. In the left lower pole the stone measures approximately 13 mm. In the upper pole the stone measures 17 mm. No obstructive changes on the left are noted. The appendix is well visualized and within normal limits. Aortoiliac calcifications are seen without aneurysmal dilatation. The bladder is well distended. Mild prostatic hypertrophy is seen. The osseous structures show prior left hip replacement and degenerative changes of the lumbar spine. No acute abnormality is noted IMPRESSION: Diffuse interstitial changes in the lung bases stable from the recent CT. Cholelithiasis without complicating factors. Left renal calculi without obstructive change. Resolution of previously seen right renal calculi. Mild fullness of the right collecting system is noted without definitive stone. Electronically Signed   By: Inez Catalina M.D.   On: 03/12/2015 19:04   Dg Chest 2 View  03/15/2015  CLINICAL DATA:  Acute pneumonitis EXAM: CHEST  2 VIEW COMPARISON:  03/11/2015 FINDINGS: There is moderate cardiac enlargement. Bilateral interstitial and airspace opacities are again noted and appears similar to previous exam. No pleural effusion noted. Previously noted lucency along the undersurface of the right hemidiaphragm has resolved. IMPRESSION: 1. No change in bilateral interstitial and airspace opacities. Electronically Signed   By: Kerby Moors M.D.   On: 03/15/2015 08:27   Dg Chest 2 View  03/11/2015  CLINICAL DATA:  82 year old male with  shortness of breath, congestion and cough. EXAM: CHEST  2 VIEW COMPARISON:  03/04/2015 and prior radiographs.  03/06/2015 chest CT. FINDINGS: Cardiomegaly again noted. Linear lucency overlying the right lower hemi thorax many represent pneumoperitoneum. Scattered interstitial and mild airspace opacities within both lungs have decreased since 03/04/2015. There is no evidence of pneumothorax.  There may be trace bilateral pleural effusions present. IMPRESSION: Possible right pneumoperitoneum. CT evaluation is recommended as clinically indicated. Decreased interstitial and airspace opacities within both lungs. Electronically Signed   By: Margarette Canada M.D.   On: 03/11/2015 10:30   Dg Chest 2 View  03/04/2015  CLINICAL DATA:  Shortness of breath EXAM: CHEST  2 VIEW COMPARISON:  02/24/2015 FINDINGS: Low volumes with coarse interstitial opacities which has been seen on the most recent studies, with patchy bilateral lung opacity. Suspect a degree of interstitial lung disease, which has progressed/ developed since early 2016. Interstitial opacities were present at the lung bases April 22, 2014 by abdominal CT. Chronic cardiopericardial enlargement. No definitive pleural effusion. No asymmetric opacity IMPRESSION: 1. Diffuse lung opacity with patchy appearance favoring pneumonia over edema. 2. Suspect underlying interstitial lung disease. Electronically Signed   By: Monte Fantasia M.D.   On: 03/04/2015 19:45   Dg Chest 2 View  02/21/2015  CLINICAL DATA:  Shortness of breath, fever. EXAM: CHEST  2 VIEW COMPARISON:  February 20, 2015. FINDINGS: Stable cardiomegaly. No pneumothorax is noted. Stable left midlung and bibasilar opacities are noted concerning for pneumonia. Increased left upper lobe opacity is noted concerning for possible pneumonia. Minimal bilateral pleural effusions are noted. Bony thorax is unremarkable. IMPRESSION: Stable left midlung in bibasilar opacities concerning for pneumonia. Increased left upper lobe opacity is noted concerning for pneumonia. Electronically Signed   By: Marijo Conception, M.D.   On: 02/21/2015 08:20   Dg Chest 2 View  02/17/2015  CLINICAL DATA:  Weakness, hypertension EXAM: CHEST  2 VIEW COMPARISON:  06/15/2014 FINDINGS: Multifocal patchy opacities, lingular and bilateral lower lobe predominant, suspicious for multifocal pneumonia. No definite pleural effusion. No  pneumothorax. Mild cardiomegaly. Visualized osseous structures are within normal limits. IMPRESSION: Multifocal patchy opacities, lingular and bilateral lower lobe predominant, suspicious for multifocal pneumonia. Electronically Signed   By: Julian Hy M.D.   On: 02/17/2015 17:33   Ct Head Wo Contrast  02/17/2015  CLINICAL DATA:  Pt c/o generalized weakness, dizziness and hypotension. Pt's wife reports slurred speech x 5 minutes earlier. Sts this is the 2nd episode this week. Pt has multiple health issues. EXAM: CT HEAD WITHOUT CONTRAST TECHNIQUE: Contiguous axial images were obtained from the base of the skull through the vertex without intravenous contrast. COMPARISON:  None. FINDINGS: There is central and cortical atrophy. Significant periventricular white matter changes are consistent with small vessel disease. There is no intra or extra-axial fluid collection or mass lesion. The basilar cisterns and ventricles have a normal appearance. There is no CT evidence for acute infarction or hemorrhage. There is dense atherosclerotic calcification of the internal carotid arteries. The paranasal sinuses and mastoid air cells are normally aerated. IMPRESSION: 1. Atrophy and small vessel disease. 2. No evidence for acute intracranial abnormality. Electronically Signed   By: Nolon Nations M.D.   On: 02/17/2015 16:54   Ct Chest Wo Contrast  03/06/2015  CLINICAL DATA:  Acute respiratory failure, personal history of atrial fibrillation, dyspnea becoming worse despite diuresis EXAM: CT CHEST WITHOUT CONTRAST TECHNIQUE: Multidetector CT imaging of the chest was performed following the standard protocol without IV contrast.  COMPARISON:  03/04/2015, 04/22/2014 FINDINGS: Mediastinum/Nodes: There is moderate cardiac enlargement. There is 3 vessel coronary artery calcification. There is moderate thoracic aorta calcification without dilatation. No significant pericardial effusion. Precarinal lymph node measures 11 mm.  Other mediastinal lymph nodes are noted at borderline size. Lungs/Pleura: There is severe diffuse interstitial change he, relatively sparing the lung apices and involving most prominently the mid to lower lung zones. There are small bilateral pleural effusions. There is mild venous congestion. Upper abdomen: Partially visualized numerous gallstones. Musculoskeletal: No acute findings IMPRESSION: 04/22/2014 CT scan of the abdomen pelvis shows mild interstitial change at the lung bases. Today's study shows much more pronounced interstitial opacification, along with small bilateral pleural effusions. There is cardio megaly with mild venous congestion. This study confirms significant acute interstitial process superimposed on mild chronic interstitial lung disease. The findings would suggest the possibility of congestive heart failure with interstitial edema, but clinical worsening despite diuresis indicates that the interstitial infiltrates seen bilaterally may be due to other etiologies including atypical pneumonia. Electronically Signed   By: Skipper Cliche M.D.   On: 03/06/2015 17:20   Mr Jodene Nam Head Wo Contrast  02/18/2015  CLINICAL DATA:  TIA.  Slurred speech EXAM: MRA HEAD WITHOUT CONTRAST TECHNIQUE: Angiographic images of the Circle of Willis were obtained using MRA technique without intravenous contrast. COMPARISON:  MRI head 02/17/2015 FINDINGS: Mild atherosclerotic disease in the cavernous segment of the internal carotid artery bilaterally without significant stenosis. Anterior and middle cerebral arteries widely patent without stenosis or aneurysm Both vertebral arteries are patent to the basilar without significant stenosis. There is mild to moderate stenosis in the proximal to mid basilar due to atherosclerotic disease. PICA, superior cerebellar, and posterior cerebral arteries are widely patent without stenosis or aneurysm. IMPRESSION: Mild atherosclerotic disease involving the cavernous carotid  bilaterally. Mild moderate stenosis of the basilar. No large vessel occlusion. Electronically Signed   By: Franchot Gallo M.D.   On: 02/18/2015 10:07   Mr Brain Wo Contrast  02/17/2015  CLINICAL DATA:  Generalized weakness, dizziness, and hypotension. Episode of slurred speech. EXAM: MRI HEAD WITHOUT CONTRAST TECHNIQUE: Multiplanar, multiecho pulse sequences of the brain and surrounding structures were obtained without intravenous contrast. COMPARISON:  CT head without contrast from the same day. FINDINGS: The diffusion-weighted images demonstrate no evidence for acute or subacute infarction. Moderate atrophy and severe diffuse periventricular and subcortical white matter disease is present bilaterally. There are remote lacunar infarcts within the basal ganglia bilaterally. A remote lacunar infarct is present within the right cerebellum. Mild white matter changes extend into the brainstem. The internal auditory canals are within normal limits bilaterally. Flow is present in the major intracranial arteries. Scleral banding is noted on the right. Bilateral lens replacements are present. The globes and orbits are otherwise normal. The paranasal sinuses and mastoid air cells are clear. IMPRESSION: 1. No acute intracranial abnormality. 2. Moderate atrophy and severe diffuse periventricular and subcortical white matter disease bilaterally compatible with chronic microvascular ischemia. 3. Multiple remote lacunar infarcts involving the basal ganglia bilaterally of the right cerebellum. Electronically Signed   By: San Morelle M.D.   On: 02/17/2015 19:24   US Renal  03/12/2015  CLINICAL DATA:  Acute renal failure EXAM: RENAL / URINARY TRACT ULTRASOUND COMPLETE COMPARISON:  07/31/2014 FINDINGS: Right Kidney: Length: 10.9 cm. Small nonobstructing stone in the lower pole. Echogenicity within normal limits. No mass or hydronephrosis visualized. Left Kidney: Length: 10.4 cm. Multiple small nonobstructing stones.  Upper pole cyst measures 2.1 cm.  Echogenicity within normal limits. No mass or hydronephrosis visualized. Bladder: Appears normal for degree of bladder distention. IMPRESSION: No acute findings.  No hydronephrosis. Bilateral nephrolithiasis. Electronically Signed   By: Rolm Baptise M.D.   On: 03/12/2015 14:37   Dg Chest Port 1 View  02/24/2015  CLINICAL DATA:  Shortness of breath and hypotension EXAM: PORTABLE CHEST 1 VIEW COMPARISON:  February 21, 2015 FINDINGS: There is patchy airspace opacity in each mid lung. There is generalized interstitial prominence, likely representing edema. There is cardiomegaly. The pulmonary vascularity is within normal limits. Bones are osteoporotic. No adenopathy evident. IMPRESSION: Suspect a degree of congestive heart failure. Patchy airspace opacity in each mid lung region may represent alveolar edema but also could represent superimposed pneumonia. No new opacity is appreciable compared to recent prior study. No change in cardiac silhouette. Electronically Signed   By: Lowella Grip III M.D.   On: 02/24/2015 08:06   Dg Chest Port 1 View  02/20/2015  CLINICAL DATA:  Shortness of breath. EXAM: PORTABLE CHEST 1 VIEW COMPARISON:  February 17, 2015. FINDINGS: Stable cardiomediastinal silhouette. No pneumothorax is noted. Stable lingular and bibasilar opacities are noted concerning for pneumonia. Bony thorax is intact. IMPRESSION: Stable bilateral lung opacities consistent with multifocal pneumonia. Electronically Signed   By: Marijo Conception, M.D.   On: 02/20/2015 12:08    Microbiology: Recent Results (from the past 240 hour(s))  Respiratory virus panel     Status: None   Collection Time: 03/10/15  7:57 PM  Result Value Ref Range Status   Source - [facility]AL [facility]  Corrected   Respiratory Syncytial Virus A Negative Negative Final   Respiratory Syncytial Virus B Negative Negative Final   Influenza A Negative Negative Final   Influenza B Negative Negative Final    Parainfluenza 1 Negative Negative Final   Parainfluenza 2 Negative Negative Final   Parainfluenza 3 Negative Negative Final   Metapneumovirus Negative Negative Final   Rhinovirus Negative Negative Final   Adenovirus Negative Negative Final    Comment: (NOTE) Performed At: [facility] [place], [place] [id number] Lindon Romp MD [id number]      Labs: Basic Metabolic Panel:  Recent Labs Lab 03/11/15 0506 03/12/15 0518 03/13/15 0621 03/14/15 0427 03/15/15 0417  NA 128* 139 136 139 138  K 5.4* 4.4 3.5 3.4* 3.5  CL 96* 104 102 105 105  CO2 21* 21* 22 24 24   GLUCOSE 183* 176* 191* 186* 205*  BUN 69* 77* 79* 87* 81*  CREATININE 2.51* 2.62* 2.46* 2.31* 2.24*  CALCIUM 8.9 9.3 8.6* 8.8* 8.6*   Liver Function Tests: No results for input(s): AST, ALT, ALKPHOS, BILITOT, PROT, ALBUMIN in the last 168 hours. No results for input(s): LIPASE, AMYLASE in the last 168 hours. No results for input(s): AMMONIA in the last 168 hours. CBC:  Recent Labs Lab 03/11/15 0506 03/12/15 0518 03/13/15 0621 03/14/15 0427 03/15/15 0417  WBC 9.2 9.4 9.7 8.1 7.6  HGB 9.1* 9.1* 8.4* 8.2* 8.4*  HCT 27.8* 27.9* 24.9* 24.7* 25.4*  MCV 97.9 97.9 96.9 97.2 98.1  PLT 310 314 262 240 227   Cardiac Enzymes: No results for input(s): CKTOTAL, CKMB, CKMBINDEX, TROPONINI in the last 168 hours. BNP: BNP (last 3 results)  Recent Labs  02/17/15 1553 03/04/15 2007 03/05/15 0147  BNP 820.8* 1506.6* 1236.3*    ProBNP (last 3 results) No results for input(s): PROBNP in the last 8760 hours.  CBG: No results for input(s): GLUCAP in the  last 168 hours.

## 2015-03-17 NOTE — Discharge Instructions (Signed)
Respiratory failure is when your lungs are not working well and your breathing (respiratory) system fails. When respiratory failure occurs, it is difficult for your lungs to get enough oxygen, get rid of carbon dioxide, or both. Respiratory failure can be life threatening.  °Respiratory failure can be acute or chronic. Acute respiratory failure is sudden, severe, and requires emergency medical treatment. Chronic respiratory failure is less severe, happens over time, and requires ongoing treatment.  °WHAT ARE THE CAUSES OF ACUTE RESPIRATORY FAILURE?  °Any problem affecting the heart or lungs can cause acute respiratory failure. Some of these causes include the following: °· Chronic bronchitis and emphysema (COPD).   °· Blood clot going to a lung (pulmonary embolism).   °· Having water in the lungs caused by heart failure, lung injury, or infection (pulmonary edema).   °· Collapsed lung (pneumothorax).   °· Pneumonia.   °· Pulmonary fibrosis.   °· Obesity.   °· Asthma.   °· Heart failure.   °· Any type of trauma to the chest that can make breathing difficult.   °· Nerve or muscle diseases making chest movements difficult. °HOW WILL MY ACUTE RESPIRATORY FAILURE BE TREATED?  °Treatment of acute respiratory failure depends on the cause of the respiratory failure. Usually, you will stay in the intensive care unit so your breathing can be watched closely. Treatment can include the following: °· Oxygen. Oxygen can be delivered through the following: °¨ Nasal cannula. This is small tubing that goes in your nose to give you oxygen. °¨ Face mask. A face mask covers your nose and mouth to give you oxygen. °· Medicine. Different medicines can be given to help with breathing. These can include: °¨ Nebulizers. Nebulizers deliver medicines to open the air passages (bronchodilators). These medicines help to open or relax the airways in the lungs so you can breathe better. They can also help loosen mucus from your  lungs. °¨ Diuretics. Diuretic medicines can help you breathe better by getting rid of extra water in your body. °¨ Steroids. Steroid medicines can help decrease swelling (inflammation) in your lungs. °¨ Antibiotics. °· Chest tube. If you have a collapsed lung (pneumothorax), a chest tube is placed to help reinflate the lung. °· Noninvasive positive pressure ventilation (NPPV). This is a tight-fitting mask that goes over your nose and mouth. The mask has tubing that is attached to a machine. The machine blows air into the tubing, which helps to keep the tiny air sacs (alveoli) in your lungs open. This machine allows you to breathe on your own. °· Ventilator. A ventilator is a breathing machine. When on a ventilator, a breathing tube is put into the lungs. A ventilator is used when you can no longer breathe well enough on your own. You may have low oxygen levels or high carbon dioxide (CO2) levels in your blood. When you are on a ventilator, sedation and pain medicines are given to make you sleep so your lungs can heal. °SEEK IMMEDIATE MEDICAL CARE IF: °· You have shortness of breath (dyspnea) with or without activity. °· You have rapid breathing (tachypnea). °· You are wheezing. °· You are unable to say more than a few words without having to catch your breath. °· You find it very difficult to function normally. °· You have a fast heart rate. °· You have a bluish color to your finger or toe nail beds. °· You have confusion or drowsiness or both. °  °This information is not intended to replace advice given to you by your health care provider. Make sure you discuss   any questions you have with your health care provider. °  °Document Released: 01/07/2013 Document Revised: 09/23/2014 Document Reviewed: 01/07/2013 °Elsevier Interactive Patient Education ©2016 [employer] ° °

## 2015-03-17 NOTE — Progress Notes (Signed)
[facility] rep, Stacie called to notify admission appt for pt is 15:00 this afternoon and Per [facility] rep, Stacie, Cm has faxed DC summary to [phone].  Cm met wit pt this am to confirm address.  [name], spouse will be receiving pt who will be transported home by [facility].  Facesheet, Medical Necessity Transfer Form, and Gold DNR on chart.  Cm waiting for confirmation from spouse so [facility] can be called.

## 2015-03-25 ENCOUNTER — Ambulatory Visit: Payer: Medicare Other | Admitting: Nurse Practitioner

## 2015-04-17 DEATH — deceased

## 2015-07-31 IMAGING — DX DG CHEST 1V PORT
1 series · 1 of 1 positions shown · non-contrast
Comparison: 04/08/2014

CLINICAL DATA: Shortness of breath and chest tightness. Three days
post sigmoid colectomy, colovesical fistula repair and incarcerated
umbilical hernia repair.

EXAM:
PORTABLE CHEST - 1 VIEW

[chest ap]
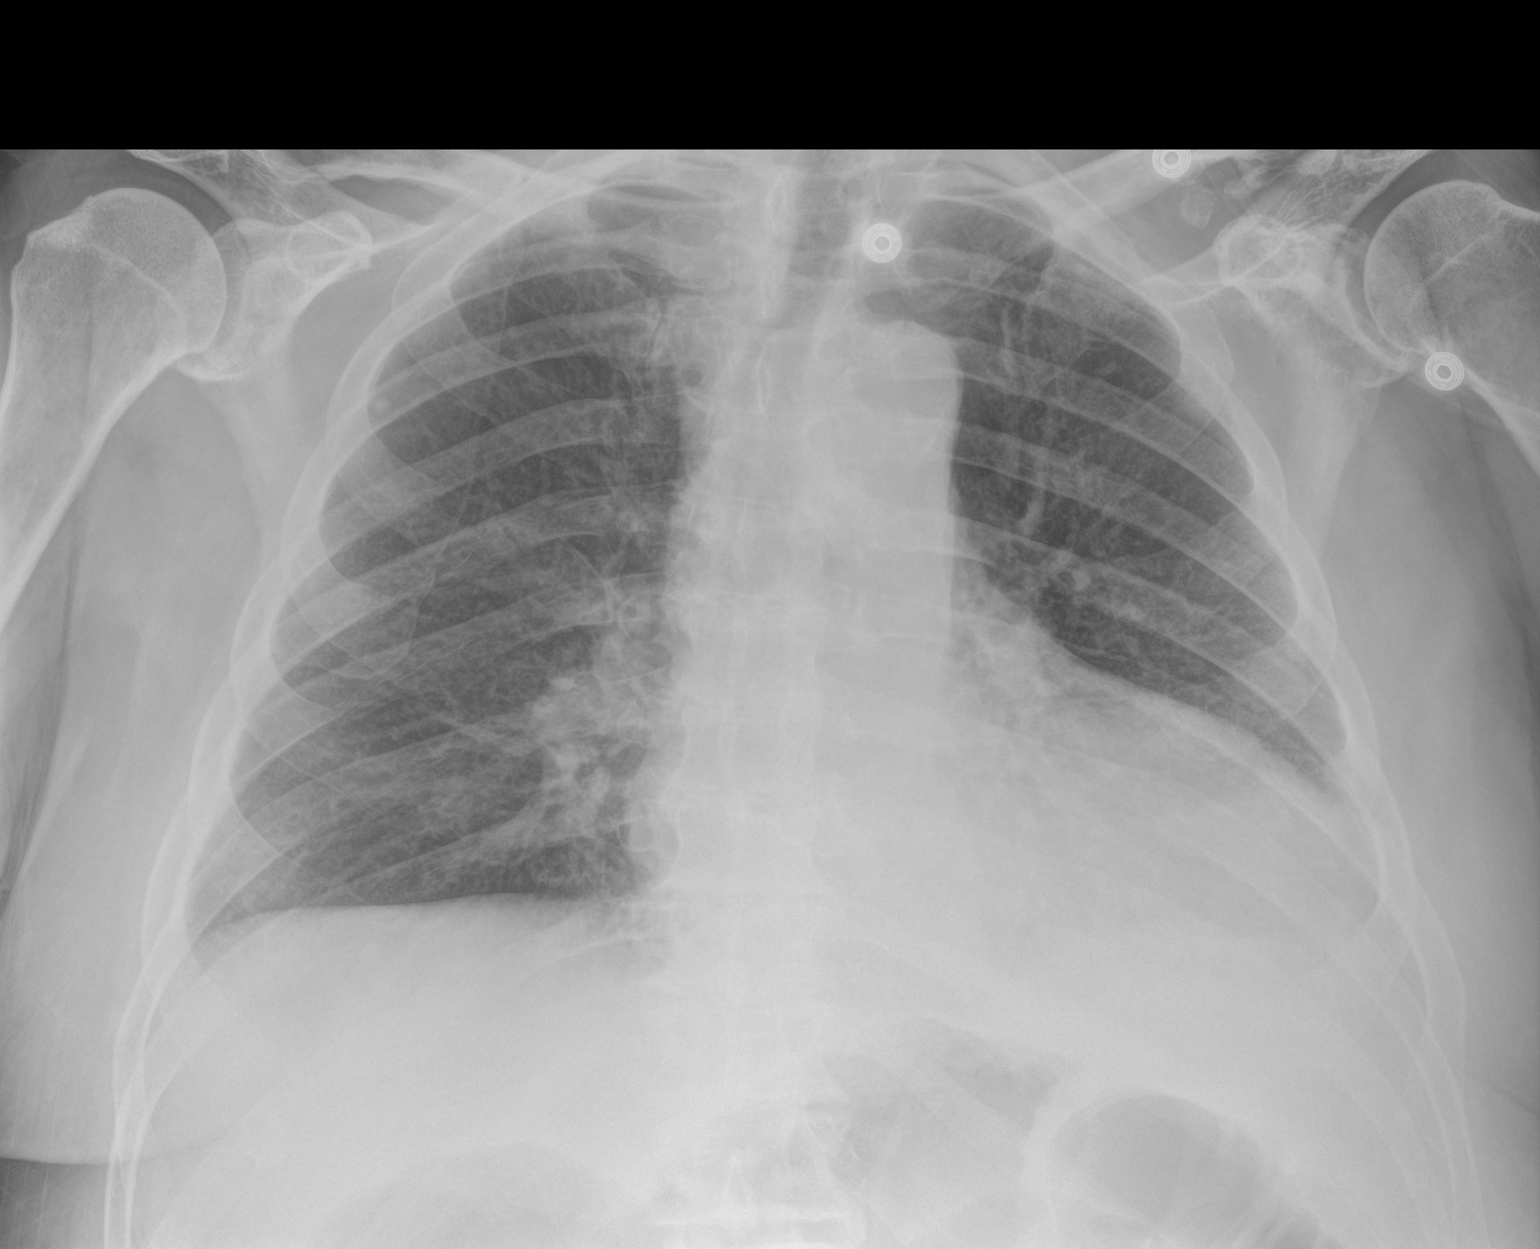

[1 of 1 positions shown; findings below may reference images not displayed]

FINDINGS: Bibasilar atelectasis present. There may be a small amount of left
pleural fluid. There is no evidence of pulmonary edema,
consolidation, pneumothorax, nodule or pleural fluid. Stable mild
cardiomegaly.
IMPRESSION: Bibasilar atelectasis and potential small left pleural effusion.

## 2016-04-04 IMAGING — MR MR MRA HEAD W/O CM
1 series · 20 of 48 positions shown · non-contrast
Comparison: MRI head 02/17/2015

CLINICAL DATA: TIA.  Slurred speech

EXAM:
MRA HEAD WITHOUT CONTRAST
TECHNIQUE: Angiographic images of the Circle of Willis were obtained using MRA
technique without intravenous contrast.

[Series 3: (id) mt fs · axial · 1.4mm · 0.43mm/px · z∈[-10,+80]mm · 20 of 136 slices shown]
[im 1/136]
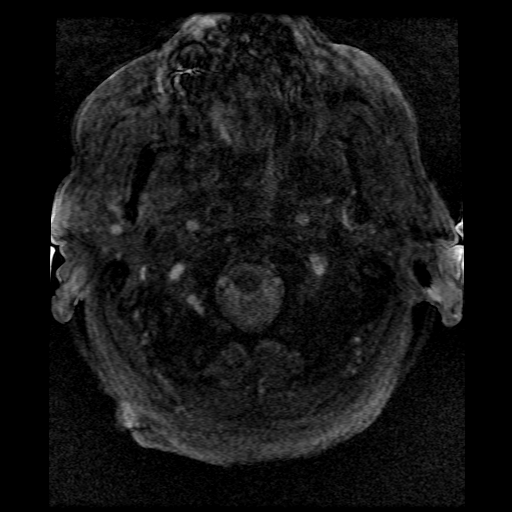
[im 3/136]
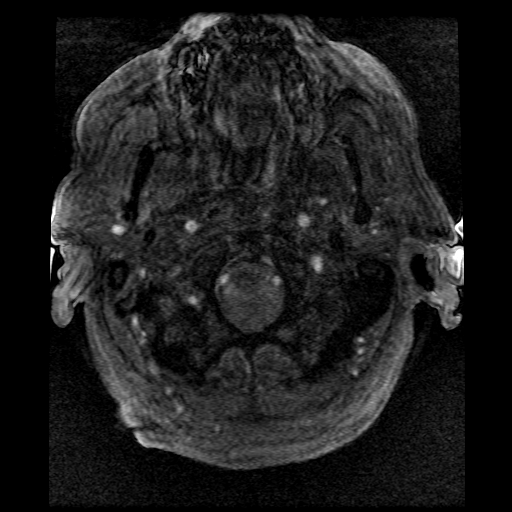
[im 6/136]
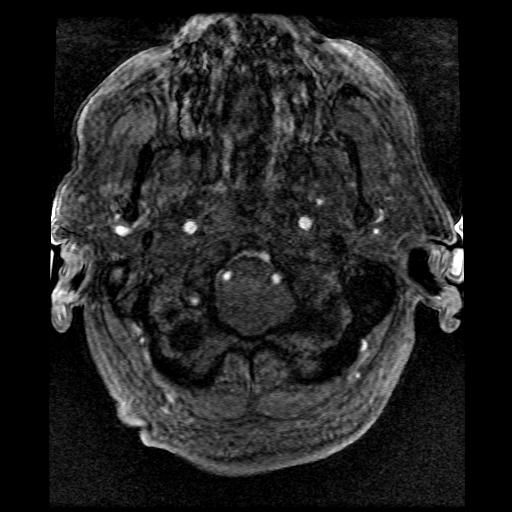
[im 9/136]
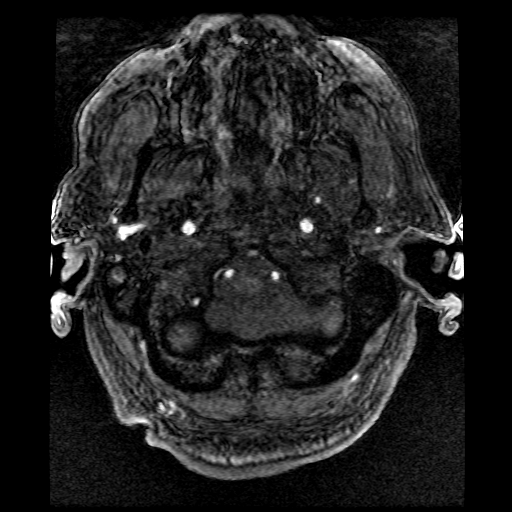
[im 12/136]
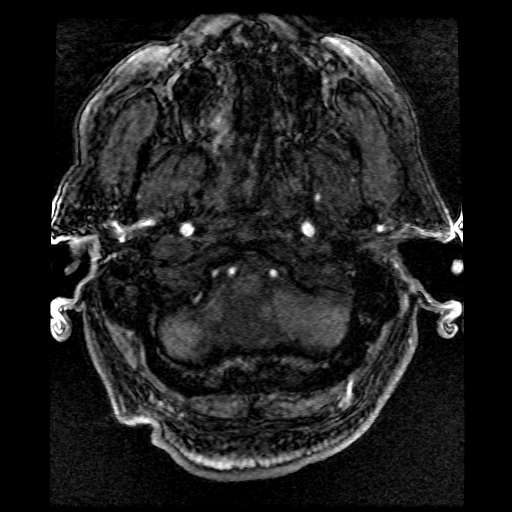
[im 15/136]
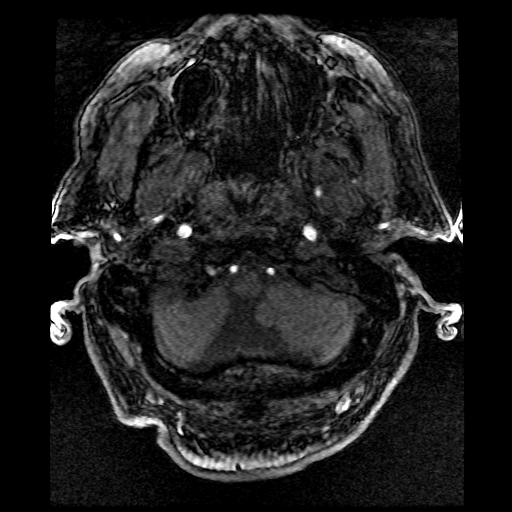
[im 18/136]
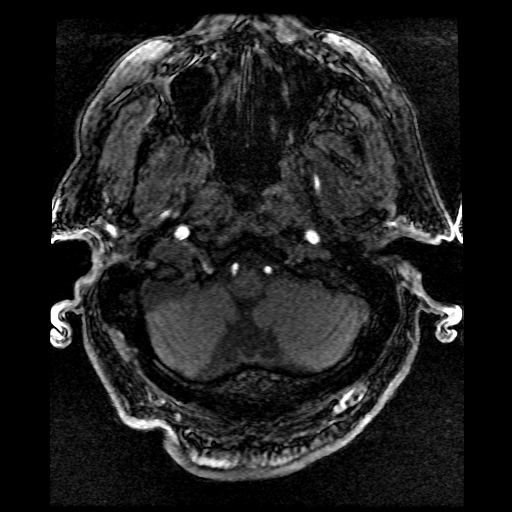
[im 21/136]
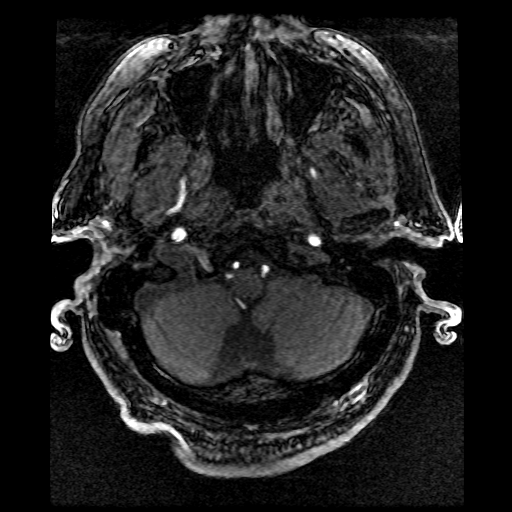
[im 23/136]
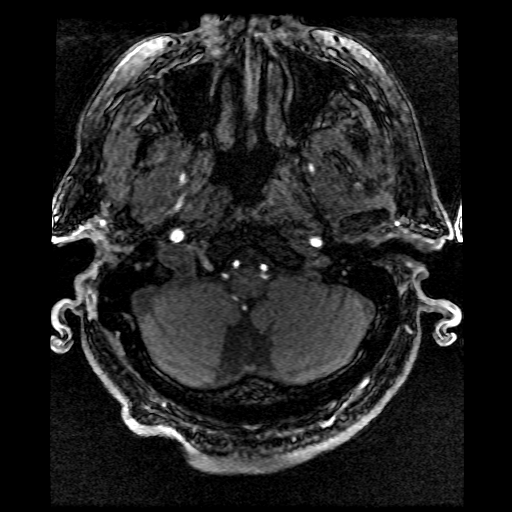
[im 26/136]
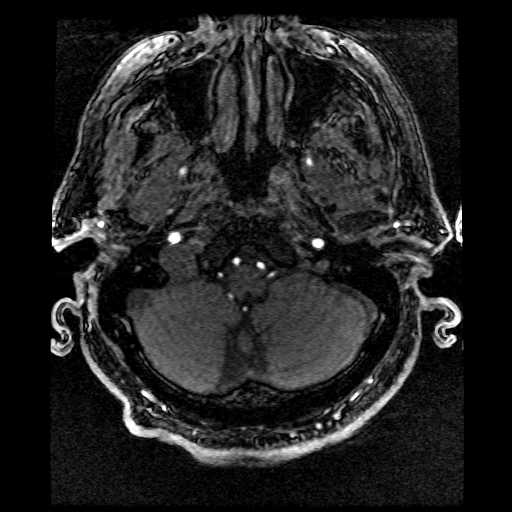
[im 29/136]
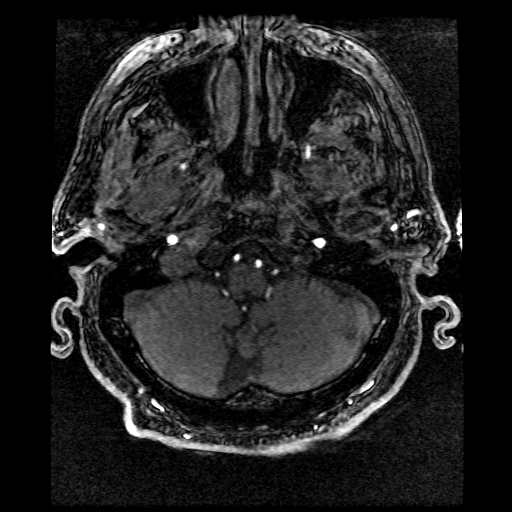
[im 32/136]
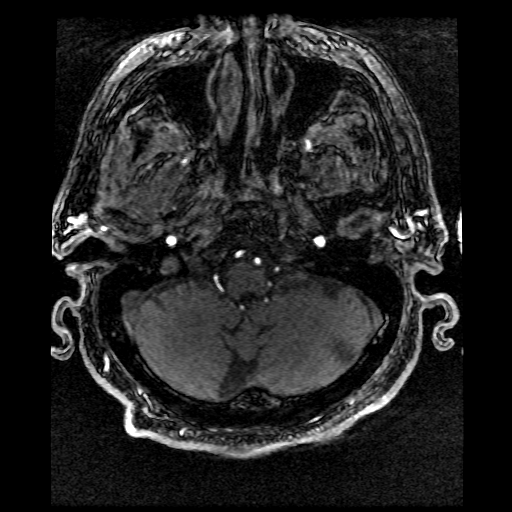
[im 44/136]
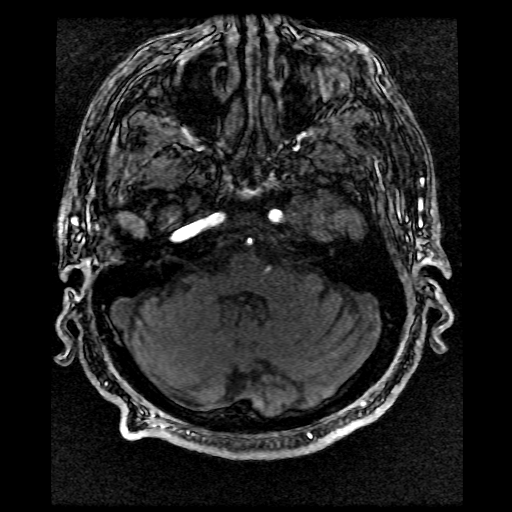
[im 61/136]
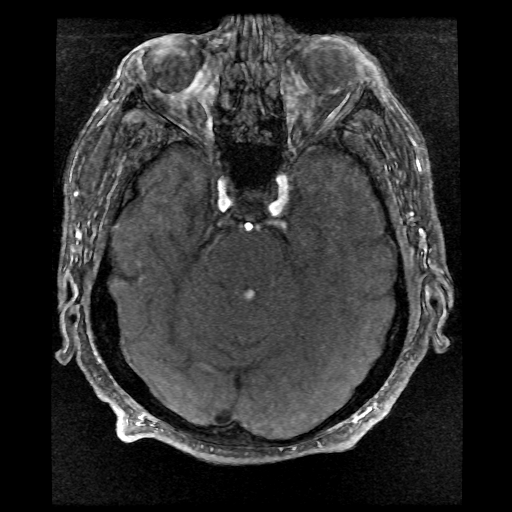
[im 69/136]
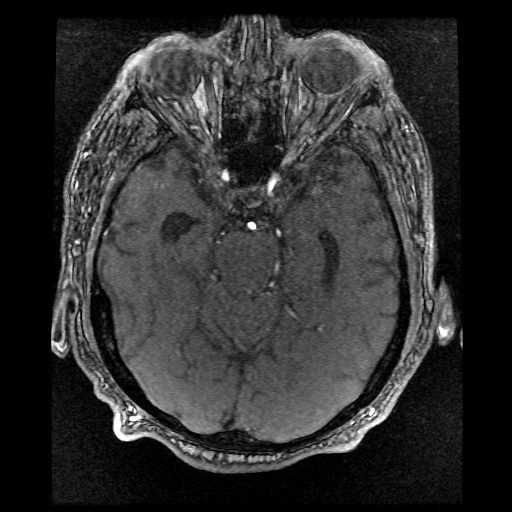
[im 78/136]
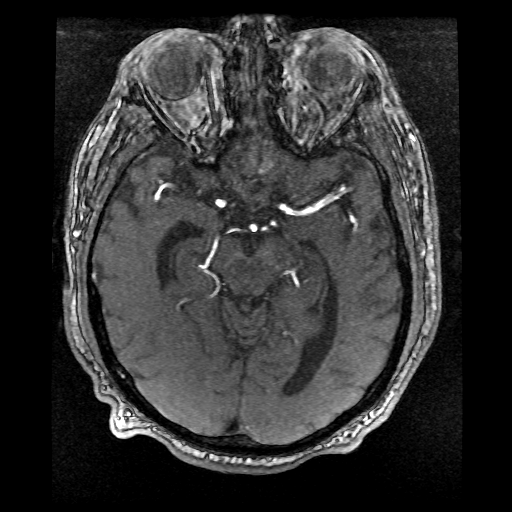
[im 95/136]
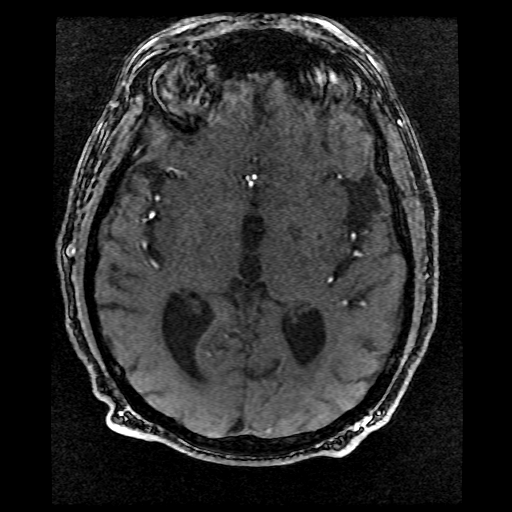
[im 113/136]
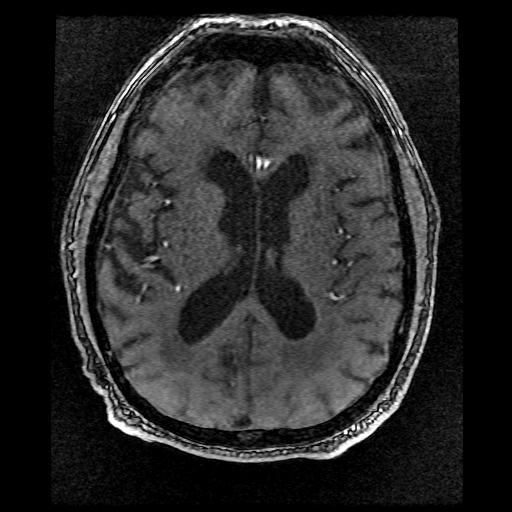
[im 115/136]
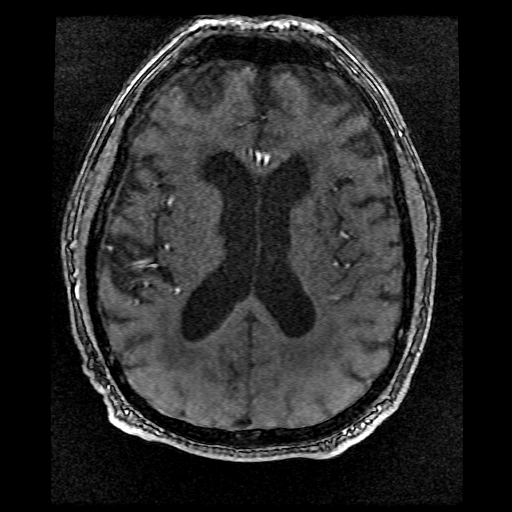
[im 130/136]
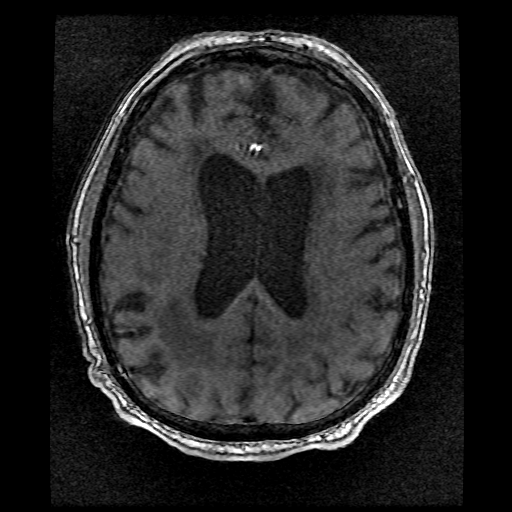

[20 of 48 positions shown; findings below may reference images not displayed]

FINDINGS: Mild atherosclerotic disease in the cavernous segment of the
internal carotid artery bilaterally without significant stenosis.
Anterior and middle cerebral arteries widely patent without stenosis
or aneurysm

Both vertebral arteries are patent to the basilar without
significant stenosis. There is mild to moderate stenosis in the
proximal to mid basilar due to atherosclerotic disease. PICA,
superior cerebellar, and posterior cerebral arteries are widely
patent without stenosis or aneurysm.
IMPRESSION: Mild atherosclerotic disease involving the cavernous carotid
bilaterally. Mild moderate stenosis of the basilar. No large vessel
occlusion.

## 2016-04-06 IMAGING — CR DG CHEST 1V PORT
1 series · 1 of 1 positions shown · non-contrast
Comparison: February 17, 2015.

CLINICAL DATA: Shortness of breath.

EXAM:
PORTABLE CHEST 1 VIEW

[AP]
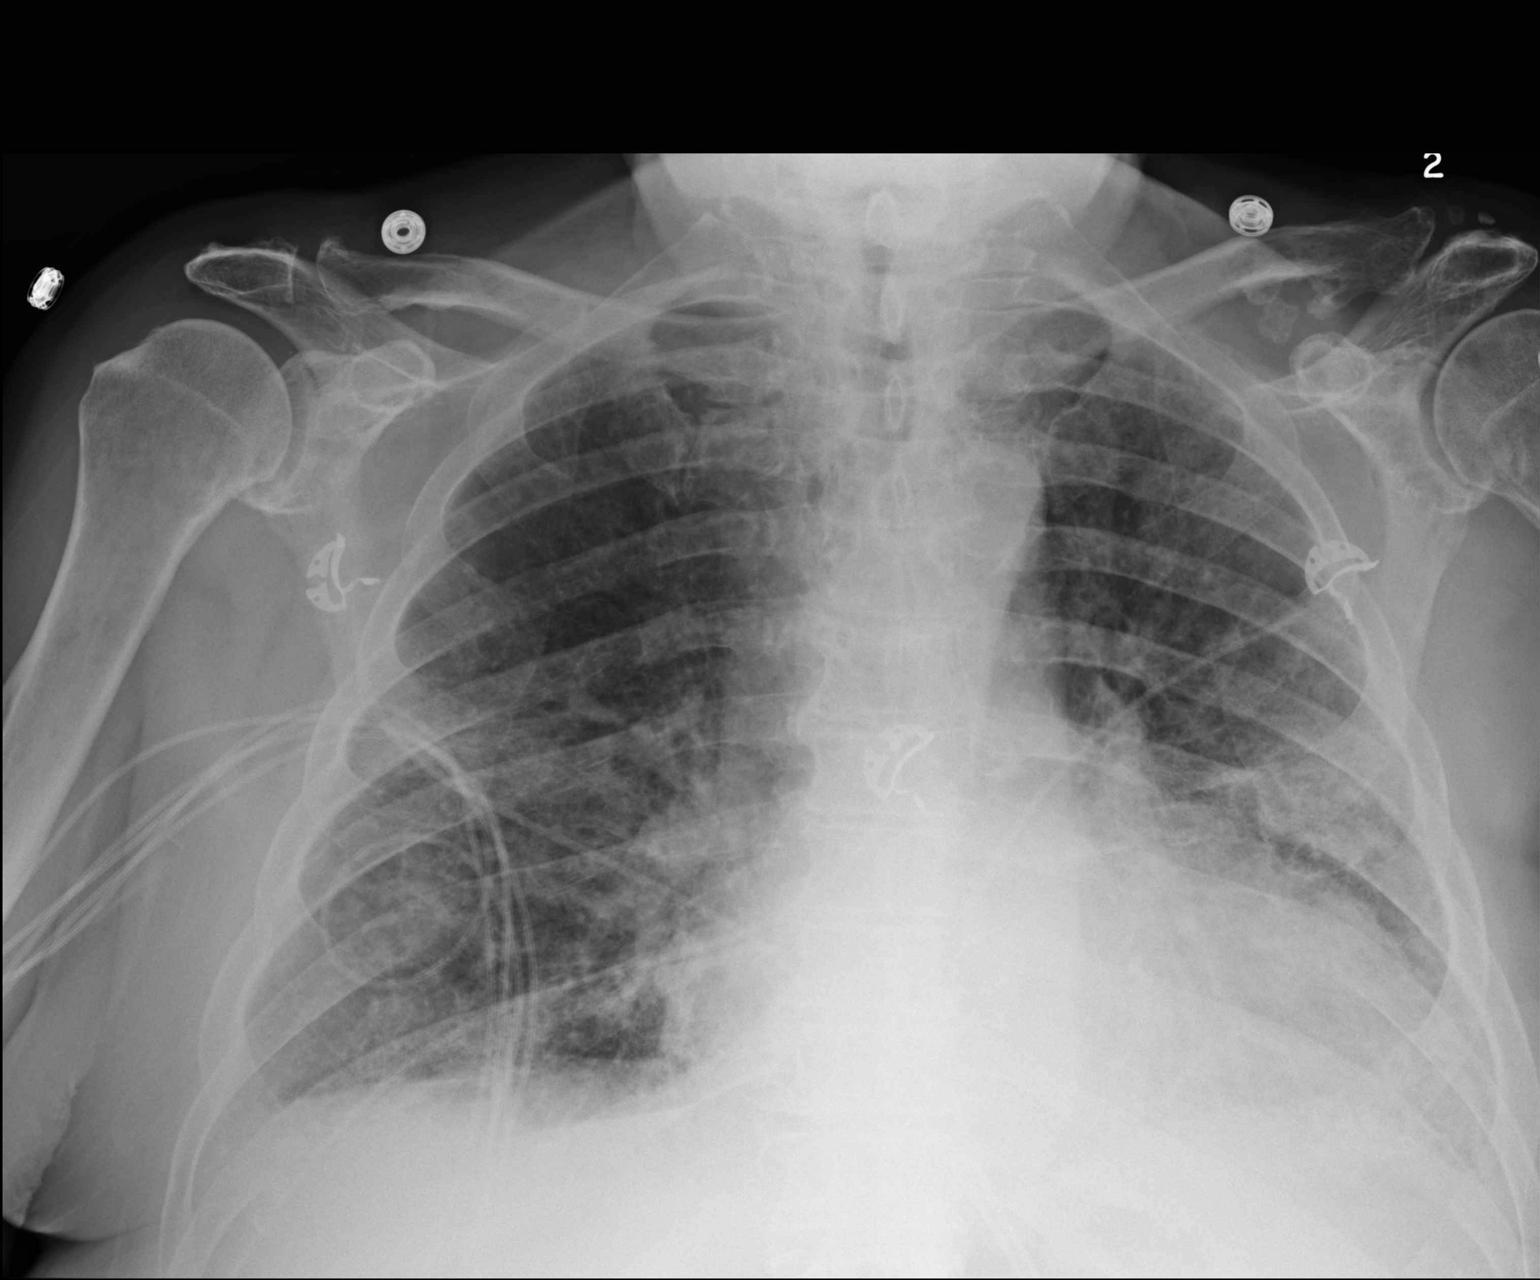

[1 of 1 positions shown; findings below may reference images not displayed]

FINDINGS: Stable cardiomediastinal silhouette. No pneumothorax is noted.
Stable lingular and bibasilar opacities are noted concerning for
pneumonia. Bony thorax is intact.
IMPRESSION: Stable bilateral lung opacities consistent with multifocal
pneumonia.

## 2016-04-07 IMAGING — DX DG CHEST 2V
2 series · 2 of 2 positions shown · non-contrast
Comparison: February 20, 2015.

CLINICAL DATA: Shortness of breath, fever.

EXAM:
CHEST  2 VIEW

[chest lat]
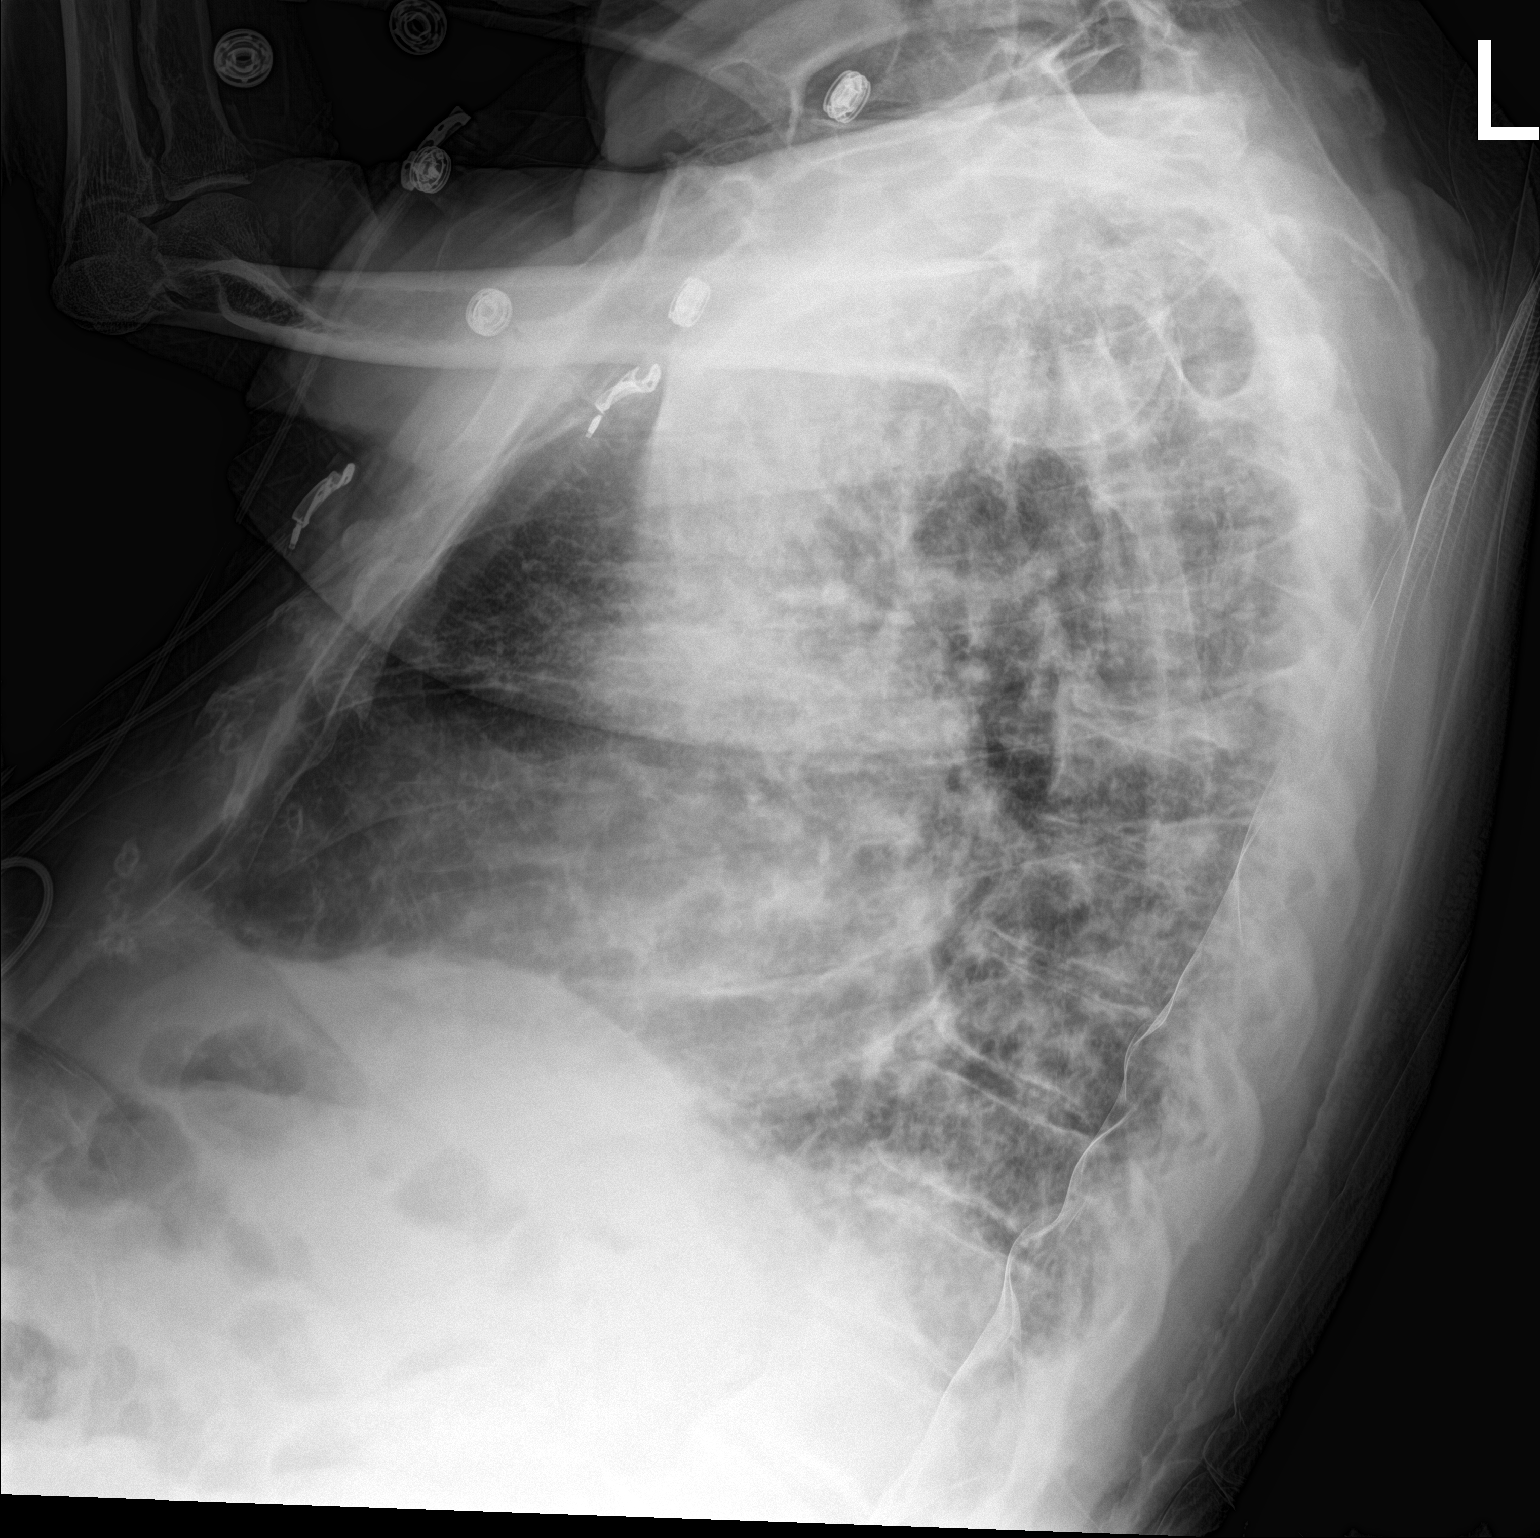

[chest ap]
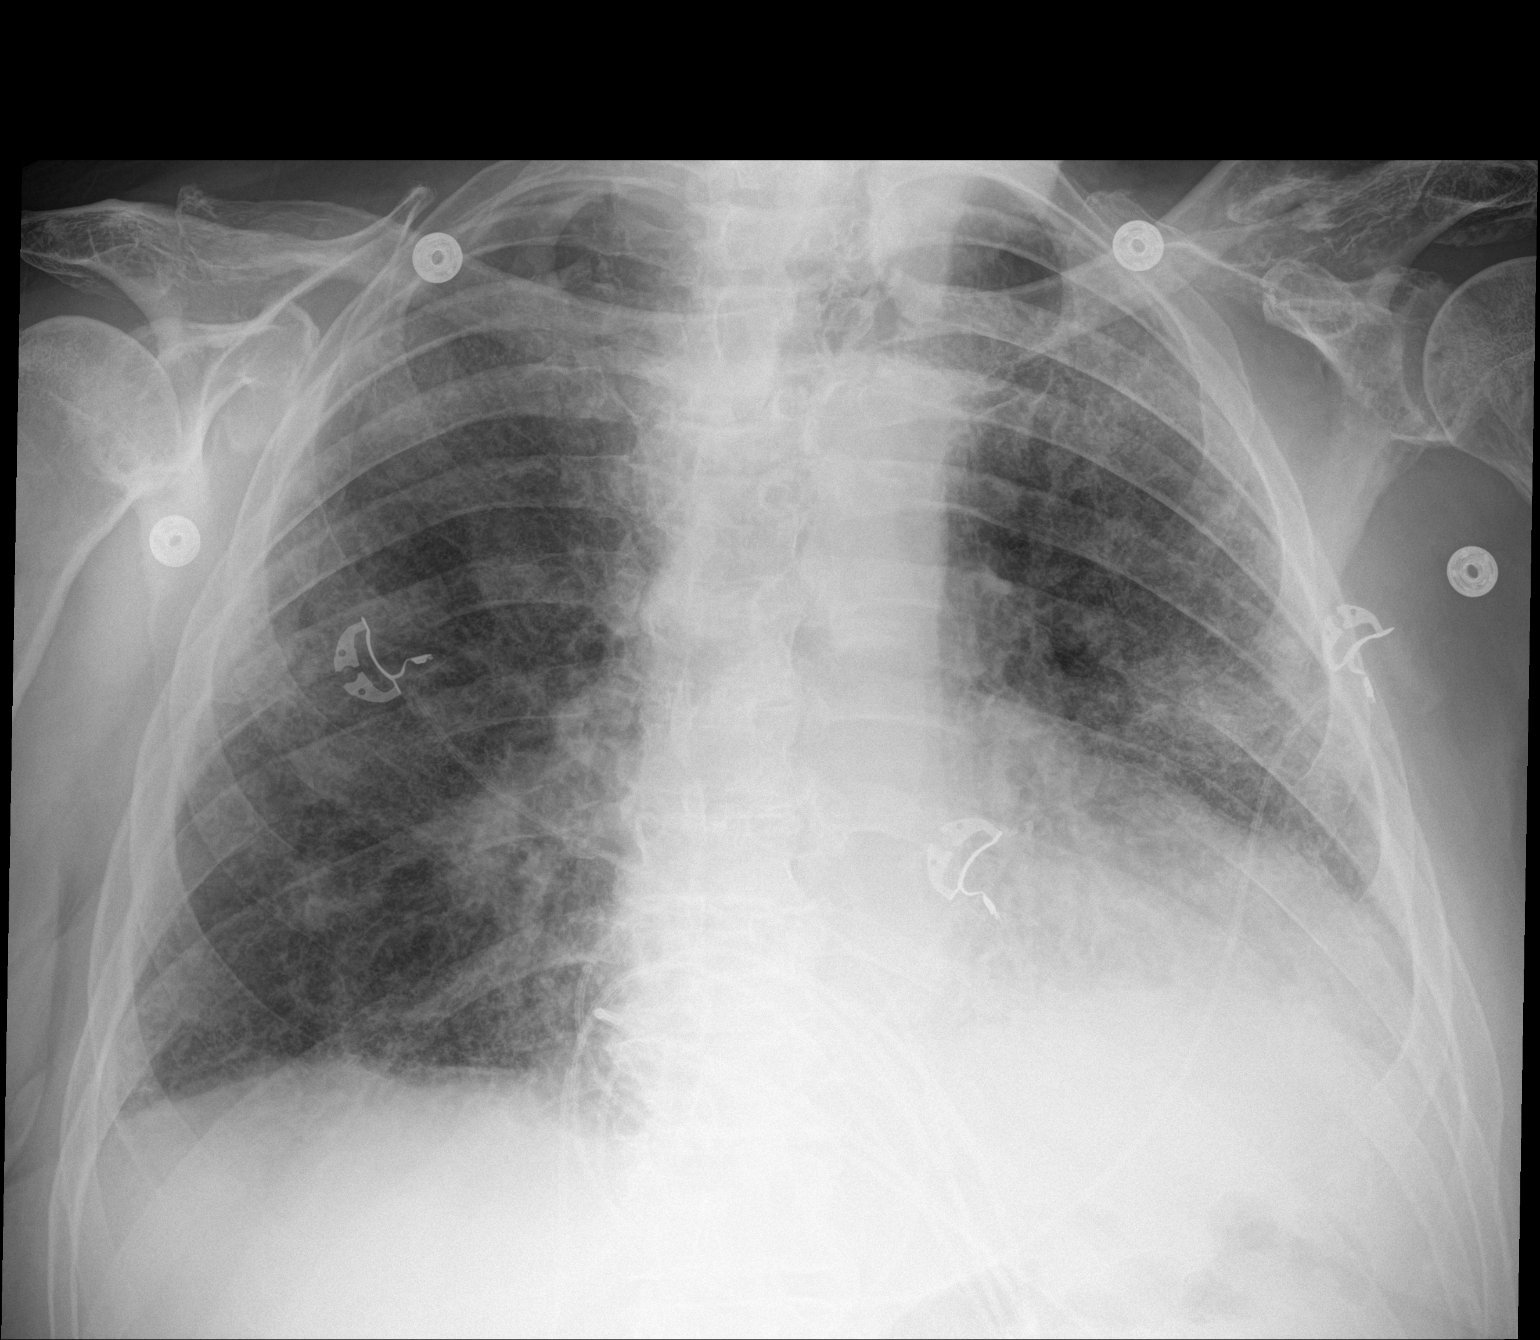

[2 of 2 positions shown; findings below may reference images not displayed]

FINDINGS: Stable cardiomegaly. No pneumothorax is noted. Stable left midlung
and bibasilar opacities are noted concerning for pneumonia.
Increased left upper lobe opacity is noted concerning for possible
pneumonia. Minimal bilateral pleural effusions are noted. Bony
thorax is unremarkable.
IMPRESSION: Stable left midlung in bibasilar opacities concerning for pneumonia.
Increased left upper lobe opacity is noted concerning for pneumonia.
